# Patient Record
Sex: Female | Born: 1945
Health system: Southern US, Community
[De-identification: ages and names within clinical notes are randomized; demographics above are authoritative.]

## PROBLEM LIST (undated history)

## (undated) DIAGNOSIS — E039 Hypothyroidism, unspecified: Secondary | ICD-10-CM

## (undated) DIAGNOSIS — D126 Benign neoplasm of colon, unspecified: Secondary | ICD-10-CM

## (undated) DIAGNOSIS — T7840XA Allergy, unspecified, initial encounter: Secondary | ICD-10-CM

## (undated) DIAGNOSIS — G4730 Sleep apnea, unspecified: Secondary | ICD-10-CM

## (undated) DIAGNOSIS — E785 Hyperlipidemia, unspecified: Secondary | ICD-10-CM

## (undated) DIAGNOSIS — F32A Depression, unspecified: Secondary | ICD-10-CM

## (undated) DIAGNOSIS — Z9889 Other specified postprocedural states: Secondary | ICD-10-CM

## (undated) DIAGNOSIS — T4145XA Adverse effect of unspecified anesthetic, initial encounter: Secondary | ICD-10-CM

## (undated) DIAGNOSIS — T8859XA Other complications of anesthesia, initial encounter: Secondary | ICD-10-CM

## (undated) DIAGNOSIS — I10 Essential (primary) hypertension: Secondary | ICD-10-CM

## (undated) DIAGNOSIS — M1990 Unspecified osteoarthritis, unspecified site: Secondary | ICD-10-CM

## (undated) DIAGNOSIS — R011 Cardiac murmur, unspecified: Secondary | ICD-10-CM

## (undated) DIAGNOSIS — E079 Disorder of thyroid, unspecified: Secondary | ICD-10-CM

## (undated) DIAGNOSIS — R112 Nausea with vomiting, unspecified: Secondary | ICD-10-CM

## (undated) DIAGNOSIS — N189 Chronic kidney disease, unspecified: Secondary | ICD-10-CM

## (undated) DIAGNOSIS — F419 Anxiety disorder, unspecified: Secondary | ICD-10-CM

## (undated) HISTORY — DX: Hyperlipidemia, unspecified: E78.5

## (undated) HISTORY — DX: Benign neoplasm of colon, unspecified: D12.6

## (undated) HISTORY — PX: SPINE SURGERY: SHX786

## (undated) HISTORY — PX: DILATION AND CURETTAGE OF UTERUS: SHX78

## (undated) HISTORY — DX: Chronic kidney disease, unspecified: N18.9

## (undated) HISTORY — DX: Anxiety disorder, unspecified: F41.9

## (undated) HISTORY — PX: ABDOMINAL HYSTERECTOMY: SHX81

## (undated) HISTORY — DX: Disorder of thyroid, unspecified: E07.9

## (undated) HISTORY — DX: Unspecified osteoarthritis, unspecified site: M19.90

## (undated) HISTORY — PX: EYE SURGERY: SHX253

## (undated) HISTORY — PX: BACK SURGERY: SHX140

## (undated) HISTORY — DX: Allergy, unspecified, initial encounter: T78.40XA

## (undated) HISTORY — PX: TUBAL LIGATION: SHX77

## (undated) HISTORY — DX: Essential (primary) hypertension: I10

## (undated) HISTORY — PX: TONSILLECTOMY: SUR1361

## (undated) HISTORY — PX: JOINT REPLACEMENT: SHX530

## (undated) HISTORY — DX: Depression, unspecified: F32.A

## (undated) HISTORY — DX: Sleep apnea, unspecified: G47.30

## (undated) HISTORY — PX: APPENDECTOMY: SHX54

## (undated) HISTORY — PX: COLONOSCOPY W/ POLYPECTOMY: SHX1380

## (undated) HISTORY — DX: Cardiac murmur, unspecified: R01.1

---
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
## 1977-08-28 HISTORY — PX: DIAGNOSTIC LAPAROSCOPY: SUR761

## 1981-08-28 HISTORY — PX: BREAST EXCISIONAL BIOPSY: SUR124

## 1998-08-28 HISTORY — PX: THYROIDECTOMY: SHX17

## 1999-08-18 ENCOUNTER — Encounter: Payer: Self-pay | Admitting: Internal Medicine

## 1999-08-18 ENCOUNTER — Ambulatory Visit (HOSPITAL_COMMUNITY): Admission: RE | Admit: 1999-08-18 | Discharge: 1999-08-18 | Payer: Self-pay | Admitting: Internal Medicine

## 1999-09-09 ENCOUNTER — Ambulatory Visit (HOSPITAL_COMMUNITY): Admission: RE | Admit: 1999-09-09 | Discharge: 1999-09-09 | Payer: Self-pay

## 1999-09-09 ENCOUNTER — Encounter (INDEPENDENT_AMBULATORY_CARE_PROVIDER_SITE_OTHER): Payer: Self-pay | Admitting: Specialist

## 1999-10-06 ENCOUNTER — Encounter (INDEPENDENT_AMBULATORY_CARE_PROVIDER_SITE_OTHER): Payer: Self-pay | Admitting: *Deleted

## 1999-10-06 ENCOUNTER — Ambulatory Visit (HOSPITAL_COMMUNITY): Admission: RE | Admit: 1999-10-06 | Discharge: 1999-10-08 | Payer: Self-pay

## 2000-02-10 ENCOUNTER — Other Ambulatory Visit: Admission: RE | Admit: 2000-02-10 | Discharge: 2000-02-10 | Payer: Self-pay | Admitting: *Deleted

## 2000-12-14 ENCOUNTER — Other Ambulatory Visit: Admission: RE | Admit: 2000-12-14 | Discharge: 2000-12-14 | Payer: Self-pay | Admitting: *Deleted

## 2000-12-14 ENCOUNTER — Encounter (INDEPENDENT_AMBULATORY_CARE_PROVIDER_SITE_OTHER): Payer: Self-pay | Admitting: Specialist

## 2001-01-16 ENCOUNTER — Other Ambulatory Visit: Admission: RE | Admit: 2001-01-16 | Discharge: 2001-01-16 | Payer: Self-pay | Admitting: *Deleted

## 2001-08-28 HISTORY — PX: TOTAL ABDOMINAL HYSTERECTOMY W/ BILATERAL SALPINGOOPHORECTOMY: SHX83

## 2002-02-13 ENCOUNTER — Encounter: Payer: Self-pay | Admitting: *Deleted

## 2002-02-18 ENCOUNTER — Observation Stay (HOSPITAL_COMMUNITY): Admission: RE | Admit: 2002-02-18 | Discharge: 2002-02-19 | Payer: Self-pay | Admitting: *Deleted

## 2002-02-18 ENCOUNTER — Encounter (INDEPENDENT_AMBULATORY_CARE_PROVIDER_SITE_OTHER): Payer: Self-pay

## 2002-04-01 ENCOUNTER — Other Ambulatory Visit: Admission: RE | Admit: 2002-04-01 | Discharge: 2002-04-01 | Payer: Self-pay | Admitting: *Deleted

## 2003-01-05 ENCOUNTER — Emergency Department (HOSPITAL_COMMUNITY): Admission: EM | Admit: 2003-01-05 | Discharge: 2003-01-05 | Payer: Self-pay | Admitting: Emergency Medicine

## 2003-01-09 ENCOUNTER — Emergency Department (HOSPITAL_COMMUNITY): Admission: EM | Admit: 2003-01-09 | Discharge: 2003-01-09 | Payer: Self-pay | Admitting: Emergency Medicine

## 2003-05-26 ENCOUNTER — Other Ambulatory Visit: Admission: RE | Admit: 2003-05-26 | Discharge: 2003-05-26 | Payer: Self-pay | Admitting: *Deleted

## 2003-07-09 ENCOUNTER — Ambulatory Visit (HOSPITAL_COMMUNITY): Admission: RE | Admit: 2003-07-09 | Discharge: 2003-07-09 | Payer: Self-pay | Admitting: Plastic Surgery

## 2003-07-09 ENCOUNTER — Ambulatory Visit (HOSPITAL_BASED_OUTPATIENT_CLINIC_OR_DEPARTMENT_OTHER): Admission: RE | Admit: 2003-07-09 | Discharge: 2003-07-09 | Payer: Self-pay | Admitting: Plastic Surgery

## 2003-07-10 ENCOUNTER — Encounter (INDEPENDENT_AMBULATORY_CARE_PROVIDER_SITE_OTHER): Payer: Self-pay | Admitting: Plastic Surgery

## 2004-01-12 ENCOUNTER — Encounter: Payer: Self-pay | Admitting: Internal Medicine

## 2004-05-19 ENCOUNTER — Other Ambulatory Visit: Admission: RE | Admit: 2004-05-19 | Discharge: 2004-05-19 | Payer: Self-pay | Admitting: *Deleted

## 2004-07-06 ENCOUNTER — Ambulatory Visit: Payer: Self-pay | Admitting: Internal Medicine

## 2004-07-29 ENCOUNTER — Ambulatory Visit: Payer: Self-pay | Admitting: Internal Medicine

## 2004-08-24 ENCOUNTER — Ambulatory Visit: Payer: Self-pay | Admitting: Internal Medicine

## 2004-12-28 ENCOUNTER — Ambulatory Visit: Payer: Self-pay | Admitting: Gastroenterology

## 2005-01-12 ENCOUNTER — Ambulatory Visit: Payer: Self-pay | Admitting: Gastroenterology

## 2005-01-30 ENCOUNTER — Ambulatory Visit: Payer: Self-pay | Admitting: Internal Medicine

## 2005-02-13 ENCOUNTER — Ambulatory Visit: Payer: Self-pay | Admitting: Internal Medicine

## 2005-02-23 ENCOUNTER — Encounter: Admission: RE | Admit: 2005-02-23 | Discharge: 2005-05-24 | Payer: Self-pay | Admitting: Internal Medicine

## 2005-05-15 ENCOUNTER — Ambulatory Visit: Payer: Self-pay | Admitting: Internal Medicine

## 2005-05-25 ENCOUNTER — Ambulatory Visit: Payer: Self-pay | Admitting: Internal Medicine

## 2005-07-06 ENCOUNTER — Ambulatory Visit: Payer: Self-pay | Admitting: Internal Medicine

## 2005-07-10 ENCOUNTER — Ambulatory Visit: Payer: Self-pay | Admitting: Internal Medicine

## 2005-07-25 ENCOUNTER — Other Ambulatory Visit: Admission: RE | Admit: 2005-07-25 | Discharge: 2005-07-25 | Payer: Self-pay | Admitting: *Deleted

## 2005-10-25 ENCOUNTER — Ambulatory Visit: Payer: Self-pay | Admitting: Internal Medicine

## 2006-03-23 ENCOUNTER — Ambulatory Visit: Payer: Self-pay | Admitting: Internal Medicine

## 2006-07-13 ENCOUNTER — Ambulatory Visit: Payer: Self-pay | Admitting: Internal Medicine

## 2006-07-13 LAB — CONVERTED CEMR LAB
ALT: 20 units/L (ref 0–40)
AST: 22 units/L (ref 0–37)
Albumin: 4 g/dL (ref 3.5–5.2)
Alkaline Phosphatase: 86 units/L (ref 39–117)
BUN: 14 mg/dL (ref 6–23)
Basophils Absolute: 0 10*3/uL (ref 0.0–0.1)
Basophils Relative: 0.2 % (ref 0.0–1.0)
CO2: 29 meq/L (ref 19–32)
Calcium: 9.2 mg/dL (ref 8.4–10.5)
Chloride: 103 meq/L (ref 96–112)
Chol/HDL Ratio, serum: 4.8
Cholesterol: 187 mg/dL (ref 0–200)
Creatinine, Ser: 0.8 mg/dL (ref 0.4–1.2)
Eosinophil percent: 1.8 % (ref 0.0–5.0)
GFR calc non Af Amer: 78 mL/min
Glomerular Filtration Rate, Af Am: 94 mL/min/{1.73_m2}
Glucose, Bld: 86 mg/dL (ref 70–99)
HCT: 43.3 % (ref 36.0–46.0)
HDL: 38.9 mg/dL — ABNORMAL LOW (ref >39.0)
Hemoglobin: 14.7 g/dL (ref 12.0–15.0)
Hgb A1c MFr Bld: 5.6 % (ref 4.6–6.0)
LDL Cholesterol: 118 mg/dL — ABNORMAL HIGH (ref 0–99)
Lymphocytes Relative: 15.9 % (ref 12.0–46.0)
MCHC: 34 g/dL (ref 30.0–36.0)
MCV: 93 fL (ref 78.0–100.0)
Monocytes Absolute: 0.8 10*3/uL — ABNORMAL HIGH (ref 0.2–0.7)
Monocytes Relative: 8.8 % (ref 3.0–11.0)
Neutro Abs: 6.5 10*3/uL (ref 1.4–7.7)
Neutrophils Relative %: 73.3 % (ref 43.0–77.0)
Platelets: 331 10*3/uL (ref 150–400)
Potassium: 3.8 meq/L (ref 3.5–5.1)
RBC: 4.65 M/uL (ref 3.87–5.11)
RDW: 11.9 % (ref 11.5–14.6)
Sodium: 141 meq/L (ref 135–145)
TSH: 0.46 microintl units/mL (ref 0.35–5.50)
Total Bilirubin: 0.8 mg/dL (ref 0.3–1.2)
Total Protein: 7.7 g/dL (ref 6.0–8.3)
Triglyceride fasting, serum: 152 mg/dL — ABNORMAL HIGH (ref 0–149)
VLDL: 30 mg/dL (ref 0–40)
WBC: 8.9 10*3/uL (ref 4.5–10.5)

## 2006-09-06 ENCOUNTER — Other Ambulatory Visit: Admission: RE | Admit: 2006-09-06 | Discharge: 2006-09-06 | Payer: Self-pay | Admitting: *Deleted

## 2007-01-24 ENCOUNTER — Encounter: Payer: Self-pay | Admitting: Internal Medicine

## 2007-01-25 ENCOUNTER — Encounter: Payer: Self-pay | Admitting: Internal Medicine

## 2007-07-11 ENCOUNTER — Ambulatory Visit: Payer: Self-pay | Admitting: Internal Medicine

## 2007-07-11 LAB — CONVERTED CEMR LAB
ALT: 19 units/L (ref 0–35)
AST: 21 units/L (ref 0–37)
Albumin: 3.9 g/dL (ref 3.5–5.2)
Alkaline Phosphatase: 89 units/L (ref 39–117)
BUN: 15 mg/dL (ref 6–23)
Basophils Absolute: 0 10*3/uL (ref 0.0–0.1)
Basophils Relative: 0.1 % (ref 0.0–1.0)
Bilirubin, Direct: 0.1 mg/dL (ref 0.0–0.3)
CO2: 33 meq/L — ABNORMAL HIGH (ref 19–32)
Calcium: 9.2 mg/dL (ref 8.4–10.5)
Chloride: 105 meq/L (ref 96–112)
Cholesterol: 162 mg/dL (ref 0–200)
Creatinine, Ser: 0.8 mg/dL (ref 0.4–1.2)
Eosinophils Absolute: 0.2 10*3/uL (ref 0.0–0.6)
Eosinophils Relative: 2.5 % (ref 0.0–5.0)
GFR calc Af Amer: 94 mL/min
GFR calc non Af Amer: 78 mL/min
Glucose, Bld: 96 mg/dL (ref 70–99)
HCT: 40.9 % (ref 36.0–46.0)
HDL: 36.2 mg/dL — ABNORMAL LOW (ref >39.0)
Hemoglobin: 14.2 g/dL (ref 12.0–15.0)
Hgb A1c MFr Bld: 5.8 % (ref 4.6–6.0)
LDL Cholesterol: 90 mg/dL (ref 0–99)
Lymphocytes Relative: 15.9 % (ref 12.0–46.0)
MCHC: 34.6 g/dL (ref 30.0–36.0)
MCV: 93.2 fL (ref 78.0–100.0)
Monocytes Absolute: 0.8 10*3/uL — ABNORMAL HIGH (ref 0.2–0.7)
Monocytes Relative: 8.5 % (ref 3.0–11.0)
Neutro Abs: 6.9 10*3/uL (ref 1.4–7.7)
Neutrophils Relative %: 73 % (ref 43.0–77.0)
Platelets: 319 10*3/uL (ref 150–400)
Potassium: 4.4 meq/L (ref 3.5–5.1)
RBC: 4.39 M/uL (ref 3.87–5.11)
RDW: 12.1 % (ref 11.5–14.6)
Sodium: 143 meq/L (ref 135–145)
TSH: 0.62 microintl units/mL (ref 0.35–5.50)
Total Bilirubin: 0.7 mg/dL (ref 0.3–1.2)
Total CHOL/HDL Ratio: 4.5
Total Protein: 7.4 g/dL (ref 6.0–8.3)
Triglycerides: 179 mg/dL — ABNORMAL HIGH (ref 0–149)
VLDL: 36 mg/dL (ref 0–40)
WBC: 9.4 10*3/uL (ref 4.5–10.5)

## 2007-07-15 DIAGNOSIS — Z85850 Personal history of malignant neoplasm of thyroid: Secondary | ICD-10-CM | POA: Insufficient documentation

## 2007-07-15 DIAGNOSIS — Z86010 Personal history of colonic polyps: Secondary | ICD-10-CM

## 2007-07-15 DIAGNOSIS — M899 Disorder of bone, unspecified: Secondary | ICD-10-CM | POA: Insufficient documentation

## 2007-07-15 DIAGNOSIS — M949 Disorder of cartilage, unspecified: Secondary | ICD-10-CM

## 2007-07-18 ENCOUNTER — Ambulatory Visit: Payer: Self-pay | Admitting: Internal Medicine

## 2007-07-18 DIAGNOSIS — E890 Postprocedural hypothyroidism: Secondary | ICD-10-CM | POA: Insufficient documentation

## 2007-07-18 DIAGNOSIS — E782 Mixed hyperlipidemia: Secondary | ICD-10-CM | POA: Insufficient documentation

## 2007-07-18 DIAGNOSIS — I10 Essential (primary) hypertension: Secondary | ICD-10-CM

## 2007-07-18 LAB — CONVERTED CEMR LAB
Cholesterol, target level: 200 mg/dL
HDL goal, serum: 40 mg/dL
LDL Goal: 130 mg/dL

## 2007-07-22 ENCOUNTER — Telehealth (INDEPENDENT_AMBULATORY_CARE_PROVIDER_SITE_OTHER): Payer: Self-pay | Admitting: *Deleted

## 2007-10-25 ENCOUNTER — Telehealth (INDEPENDENT_AMBULATORY_CARE_PROVIDER_SITE_OTHER): Payer: Self-pay | Admitting: *Deleted

## 2007-10-31 ENCOUNTER — Other Ambulatory Visit: Admission: RE | Admit: 2007-10-31 | Discharge: 2007-10-31 | Payer: Self-pay | Admitting: *Deleted

## 2008-02-21 ENCOUNTER — Ambulatory Visit: Payer: Self-pay | Admitting: Internal Medicine

## 2008-02-27 ENCOUNTER — Telehealth (INDEPENDENT_AMBULATORY_CARE_PROVIDER_SITE_OTHER): Payer: Self-pay | Admitting: *Deleted

## 2008-03-01 LAB — CONVERTED CEMR LAB
Hgb A1c MFr Bld: 5.8 % (ref 4.6–6.0)
TSH: 1.84 microintl units/mL (ref 0.35–5.50)

## 2008-03-03 ENCOUNTER — Encounter (INDEPENDENT_AMBULATORY_CARE_PROVIDER_SITE_OTHER): Payer: Self-pay | Admitting: *Deleted

## 2008-03-26 ENCOUNTER — Telehealth (INDEPENDENT_AMBULATORY_CARE_PROVIDER_SITE_OTHER): Payer: Self-pay | Admitting: *Deleted

## 2008-04-23 ENCOUNTER — Ambulatory Visit: Payer: Self-pay | Admitting: Internal Medicine

## 2008-04-27 LAB — CONVERTED CEMR LAB: TSH: 0.18 microintl units/mL — ABNORMAL LOW (ref 0.35–5.50)

## 2008-04-28 ENCOUNTER — Encounter (INDEPENDENT_AMBULATORY_CARE_PROVIDER_SITE_OTHER): Payer: Self-pay | Admitting: *Deleted

## 2008-04-29 ENCOUNTER — Encounter (INDEPENDENT_AMBULATORY_CARE_PROVIDER_SITE_OTHER): Payer: Self-pay | Admitting: *Deleted

## 2008-04-29 ENCOUNTER — Ambulatory Visit: Payer: Self-pay | Admitting: Internal Medicine

## 2008-05-05 ENCOUNTER — Telehealth (INDEPENDENT_AMBULATORY_CARE_PROVIDER_SITE_OTHER): Payer: Self-pay | Admitting: *Deleted

## 2008-05-05 ENCOUNTER — Encounter (INDEPENDENT_AMBULATORY_CARE_PROVIDER_SITE_OTHER): Payer: Self-pay | Admitting: *Deleted

## 2008-05-19 ENCOUNTER — Ambulatory Visit: Payer: Self-pay | Admitting: Internal Medicine

## 2008-05-19 ENCOUNTER — Encounter: Payer: Self-pay | Admitting: Internal Medicine

## 2008-06-09 ENCOUNTER — Encounter (INDEPENDENT_AMBULATORY_CARE_PROVIDER_SITE_OTHER): Payer: Self-pay | Admitting: *Deleted

## 2008-06-10 ENCOUNTER — Ambulatory Visit: Payer: Self-pay | Admitting: Internal Medicine

## 2008-08-28 HISTORY — PX: NEPHRECTOMY: SHX65

## 2008-11-19 ENCOUNTER — Encounter: Payer: Self-pay | Admitting: Internal Medicine

## 2008-11-20 ENCOUNTER — Encounter: Admission: RE | Admit: 2008-11-20 | Discharge: 2008-11-20 | Payer: Self-pay | Admitting: Urology

## 2008-12-02 ENCOUNTER — Encounter: Payer: Self-pay | Admitting: Internal Medicine

## 2008-12-15 ENCOUNTER — Encounter: Payer: Self-pay | Admitting: Internal Medicine

## 2008-12-28 ENCOUNTER — Inpatient Hospital Stay (HOSPITAL_COMMUNITY): Admission: RE | Admit: 2008-12-28 | Discharge: 2008-12-31 | Payer: Self-pay | Admitting: Urology

## 2008-12-28 ENCOUNTER — Encounter (INDEPENDENT_AMBULATORY_CARE_PROVIDER_SITE_OTHER): Payer: Self-pay | Admitting: Urology

## 2009-02-05 ENCOUNTER — Telehealth (INDEPENDENT_AMBULATORY_CARE_PROVIDER_SITE_OTHER): Payer: Self-pay | Admitting: *Deleted

## 2009-03-04 ENCOUNTER — Ambulatory Visit: Payer: Self-pay | Admitting: Family Medicine

## 2009-03-04 DIAGNOSIS — I8000 Phlebitis and thrombophlebitis of superficial vessels of unspecified lower extremity: Secondary | ICD-10-CM | POA: Insufficient documentation

## 2009-03-25 ENCOUNTER — Ambulatory Visit: Payer: Self-pay | Admitting: Family Medicine

## 2009-03-26 ENCOUNTER — Telehealth (INDEPENDENT_AMBULATORY_CARE_PROVIDER_SITE_OTHER): Payer: Self-pay | Admitting: *Deleted

## 2009-04-26 ENCOUNTER — Telehealth (INDEPENDENT_AMBULATORY_CARE_PROVIDER_SITE_OTHER): Payer: Self-pay | Admitting: *Deleted

## 2009-05-28 ENCOUNTER — Ambulatory Visit: Payer: Self-pay | Admitting: Internal Medicine

## 2009-05-28 DIAGNOSIS — Z905 Acquired absence of kidney: Secondary | ICD-10-CM

## 2009-05-28 DIAGNOSIS — Z87442 Personal history of urinary calculi: Secondary | ICD-10-CM | POA: Insufficient documentation

## 2009-05-31 ENCOUNTER — Encounter (INDEPENDENT_AMBULATORY_CARE_PROVIDER_SITE_OTHER): Payer: Self-pay | Admitting: *Deleted

## 2009-06-01 ENCOUNTER — Encounter (INDEPENDENT_AMBULATORY_CARE_PROVIDER_SITE_OTHER): Payer: Self-pay | Admitting: *Deleted

## 2009-06-04 ENCOUNTER — Encounter: Payer: Self-pay | Admitting: Internal Medicine

## 2009-06-11 ENCOUNTER — Ambulatory Visit: Payer: Self-pay | Admitting: Internal Medicine

## 2009-06-14 ENCOUNTER — Encounter: Payer: Self-pay | Admitting: Family Medicine

## 2009-07-28 ENCOUNTER — Telehealth (INDEPENDENT_AMBULATORY_CARE_PROVIDER_SITE_OTHER): Payer: Self-pay | Admitting: *Deleted

## 2009-09-15 ENCOUNTER — Telehealth (INDEPENDENT_AMBULATORY_CARE_PROVIDER_SITE_OTHER): Payer: Self-pay | Admitting: *Deleted

## 2009-09-15 ENCOUNTER — Encounter (INDEPENDENT_AMBULATORY_CARE_PROVIDER_SITE_OTHER): Payer: Self-pay | Admitting: *Deleted

## 2009-12-01 ENCOUNTER — Ambulatory Visit: Payer: Self-pay | Admitting: Internal Medicine

## 2009-12-03 ENCOUNTER — Telehealth (INDEPENDENT_AMBULATORY_CARE_PROVIDER_SITE_OTHER): Payer: Self-pay | Admitting: *Deleted

## 2009-12-03 ENCOUNTER — Encounter: Payer: Self-pay | Admitting: Internal Medicine

## 2009-12-06 LAB — CONVERTED CEMR LAB
AST: 22 units/L (ref 0–37)
Albumin: 4.3 g/dL (ref 3.5–5.2)
Alkaline Phosphatase: 111 units/L (ref 39–117)
Bilirubin, Direct: 0 mg/dL (ref 0.0–0.3)
Cholesterol: 243 mg/dL — ABNORMAL HIGH (ref 0–200)
Total CHOL/HDL Ratio: 5

## 2009-12-23 ENCOUNTER — Telehealth (INDEPENDENT_AMBULATORY_CARE_PROVIDER_SITE_OTHER): Payer: Self-pay | Admitting: *Deleted

## 2009-12-24 ENCOUNTER — Encounter (INDEPENDENT_AMBULATORY_CARE_PROVIDER_SITE_OTHER): Payer: Self-pay | Admitting: *Deleted

## 2010-03-09 ENCOUNTER — Ambulatory Visit: Payer: Self-pay | Admitting: Internal Medicine

## 2010-03-09 ENCOUNTER — Telehealth: Payer: Self-pay | Admitting: Internal Medicine

## 2010-03-10 ENCOUNTER — Ambulatory Visit: Payer: Self-pay | Admitting: Internal Medicine

## 2010-04-18 ENCOUNTER — Ambulatory Visit: Payer: Self-pay | Admitting: Internal Medicine

## 2010-04-18 LAB — CONVERTED CEMR LAB: OCCULT 2: NEGATIVE

## 2010-04-19 ENCOUNTER — Encounter (INDEPENDENT_AMBULATORY_CARE_PROVIDER_SITE_OTHER): Payer: Self-pay | Admitting: *Deleted

## 2010-04-22 ENCOUNTER — Telehealth (INDEPENDENT_AMBULATORY_CARE_PROVIDER_SITE_OTHER): Payer: Self-pay | Admitting: *Deleted

## 2010-06-17 ENCOUNTER — Encounter: Payer: Self-pay | Admitting: Internal Medicine

## 2010-06-29 ENCOUNTER — Ambulatory Visit: Payer: Self-pay | Admitting: Internal Medicine

## 2010-07-29 ENCOUNTER — Telehealth: Payer: Self-pay | Admitting: Internal Medicine

## 2010-08-19 ENCOUNTER — Ambulatory Visit: Payer: Self-pay | Admitting: Internal Medicine

## 2010-08-19 ENCOUNTER — Encounter: Payer: Self-pay | Admitting: Internal Medicine

## 2010-08-23 LAB — CONVERTED CEMR LAB
AST: 24 units/L (ref 0–37)
Alkaline Phosphatase: 107 units/L (ref 39–117)
Basophils Absolute: 0 10*3/uL (ref 0.0–0.1)
Bilirubin, Direct: 0.1 mg/dL (ref 0.0–0.3)
CO2: 30 meq/L (ref 19–32)
Calcium: 9.5 mg/dL (ref 8.4–10.5)
Creatinine, Ser: 1.1 mg/dL (ref 0.4–1.2)
Eosinophils Absolute: 0.2 10*3/uL (ref 0.0–0.7)
GFR calc non Af Amer: 53.61 mL/min — ABNORMAL LOW (ref >60.00)
Glucose, Bld: 67 mg/dL — ABNORMAL LOW (ref 70–99)
Hemoglobin: 14.7 g/dL (ref 12.0–15.0)
Lymphocytes Relative: 20.2 % (ref 12.0–46.0)
MCHC: 34.7 g/dL (ref 30.0–36.0)
Monocytes Relative: 7.5 % (ref 3.0–12.0)
Neutro Abs: 4.7 10*3/uL (ref 1.4–7.7)
Neutrophils Relative %: 69.3 % (ref 43.0–77.0)
RDW: 12.5 % (ref 11.5–14.6)
Sodium: 140 meq/L (ref 135–145)
Total Bilirubin: 0.7 mg/dL (ref 0.3–1.2)

## 2010-08-24 ENCOUNTER — Telehealth (INDEPENDENT_AMBULATORY_CARE_PROVIDER_SITE_OTHER): Payer: Self-pay | Admitting: *Deleted

## 2010-09-25 LAB — CONVERTED CEMR LAB
ALT: 25 units/L (ref 0–35)
Albumin: 4.5 g/dL (ref 3.5–5.2)
Alkaline Phosphatase: 95 units/L (ref 39–117)
CO2: 31 meq/L (ref 19–32)
Calcium: 9.7 mg/dL (ref 8.4–10.5)
Creatinine, Ser: 1.1 mg/dL (ref 0.4–1.2)
Glucose, Bld: 85 mg/dL (ref 70–99)
Sodium: 144 meq/L (ref 135–145)
Total Protein: 7.8 g/dL (ref 6.0–8.3)

## 2010-09-29 NOTE — Assessment & Plan Note (Signed)
Summary: flu shot/cbs  Nurse Visit  CC: Flu shot./kb   Allergies: 1)  ! Codeine Sulfate (Codeine Sulfate) 2)  ! * Normadyne 3)  ! Erythromycin 4)  ! * Lipitor 40  Orders Added: 1)  Admin 1st Vaccine [90471] 2)  Flu Vaccine 79yrs + [[address]]              Flu Vaccine Consent Questions     Do you have a history of severe allergic reactions to this vaccine? no    Any prior history of allergic reactions to egg and/or gelatin? no    Do you have a sensitivity to the preservative Thimersol? no    Do you have a past history of Guillan-Barre Syndrome? no    Do you currently have an acute febrile illness? no    Have you ever had a severe reaction to latex? no    Vaccine information given and explained to patient? yes    Are you currently pregnant? no    Lot Number:[id number]   Exp Date:02/25/2011   Site Given  Left Deltoid IMu

## 2010-09-29 NOTE — Progress Notes (Signed)
Summary: refill  Phone Note Refill Request Message from:  Fax from Pharmacy on July 29, 2010 10:14 AM  Refills Requested: Medication #1:  LORAZEPAM 1 MG  TABS take 1/2 tab three times a day as needed [facility] [address] - fax [phone]  Initial call taken by: Okey Regal Spring,  July 29, 2010 10:15 AM    Prescriptions: LORAZEPAM 1 MG  TABS (LORAZEPAM) take 1/2 tab three times a day as needed  #60 x 0   Entered by:   Lucious Groves CMA   Authorized by:   Marga Melnick MD   Signed by:   Lucious Groves CMA on 07/29/2010   Method used:   Printed then faxed to ...       [id number] ([facility]       [address]       [place], [place]  [address]       Ph: [phone] or [phone]       Fax: [phone]   RxID:   [id number]

## 2010-09-29 NOTE — Progress Notes (Signed)
Summary: Refill Request  Phone Note Refill Request Message from:  [facility] [address] Fax #: [phone]  Refills Requested: Medication #1:  LORAZEPAM 1 MG  TABS take 1/2 tab three times a day as needed   Dosage confirmed as above?Dosage Confirmed   Supply Requested: 1 month   Last Refilled: 10/30/2009 Next Appointment Scheduled: none Initial call taken by: Harold Barban,  December 03, 2009 8:39 AM    Prescriptions: LORAZEPAM 1 MG  TABS (LORAZEPAM) take 1/2 tab three times a day as needed  #60 x 2   Entered by:   Shonna Chock   Authorized by:   Marga Melnick MD   Signed by:   Shonna Chock on 12/03/2009   Method used:   Printed then faxed to ...       [facility] [address] [facility] [address] (retail)       [address] [place], [place]  [address]       Ph: [phone]       Fax: [phone]   RxID:   [id number]

## 2010-09-29 NOTE — Progress Notes (Signed)
Summary: SIMVASTATIN 40 REFILL--NEW INFO ABOUT THIS MED  Phone Note Call from Patient Call back at Home Phone [phone]   Caller: Patient Summary of Call: PATIENT HAS NEW GRANDCHILD AND FORGOT TO SCHEDULE APPOINTMENT WITH DR HOPPER FOR MED REFILL---ONLY HAS 2 PILLS OF SIMVASTATIN 40 LEFT (THURSDAY AND FRIDAY)  HAS APPOINTMENT FOR THURSDAY, 12/30/2009 AT 4PM ----WANTS TO KNOW IF WE CAN CALL IN A  30 DAY PRESCRIPTION FOR GENERIC SIMVASTATIN 40 TO [facility] [address] (SHE IS CHANGING TO THIS [facility]) THAT WILL HOLD HER OVER UNTIL HER APPOINTMENT??  THERE IS A REFILL IN COMPUTER FOR 90 PILLS TO GO TO THE [facility] [address]---SHE DOES NOT WANT THAT PRESCRIPTION---SHE WANTS TO TALK TO DR HOPPER ABOUT THIS MEDICATION Initial call taken by: Jerolyn Shin,  December 23, 2009 3:44 PM  Follow-up for Phone Call        Patient aware rx sent to [facility] [address]. I sent number 90 cause it may be cheaper but patient aware if she would only like number 30 to inform the pharmacy Follow-up by: Shonna Chock,  December 23, 2009 4:57 PM    Prescriptions: SIMVASTATIN 40 MG TABS (SIMVASTATIN) 1 by mouth at bedtime  #90 x 0   Entered by:   Shonna Chock   Authorized by:   Marga Melnick MD   Signed by:   Shonna Chock on 12/23/2009   Method used:   Electronically to        [id number] ([facility]       [address]       [place], [place]  [address]       Ph: [phone] or [phone]       Fax: [phone]   RxID:   [id number]

## 2010-09-29 NOTE — Progress Notes (Signed)
Summary: OV Correct  Phone Note Call from Patient Call back at Home Phone [phone]   Caller: Patient Summary of Call: Message left on voicemail: Patient was suppose to email her paperwork and let Dr.Hopper know if any changes were to be made. Patient stated something is wrong with her computer so she was just calling to let Dr.Hopper know she did not find any changes to be made on her printout of her OV that was given to her on 08/19/2010   Shonna Chock CMA  August 24, 2010 10:49 AM

## 2010-09-29 NOTE — Letter (Signed)
Summary: Primary Care Appointment Letter  [facility] [place]  [address] [place], [place] [address]   Phone: [phone]  Fax: [phone]    09/15/2009 MRN: [id number]  [facility] [address] [place], [place]  [address]  Dear Ms. Vanwart,   Your Primary Care Physician Marga Melnick MD has indicated that:    _______it is time to schedule an appointment.    _______you missed your appointment on______ and need to call and          reschedule.    ___X____you need to have lab work done (Due to recent change in [employer] med, Dr.Hopper would like for you to have labs after 10weeks of starting new med "Simvastatin") Lipid/Hepatic 272.4/995.20 .    _______you need to schedule an appointment discuss lab or test results.    _______you need to call to reschedule your appointment that is                       scheduled on _________.     Please call our office as soon as possible. Our phone number is [phone]. Please press option 1. Our office is open 8a-12noon and 1p-5p, Monday through Friday.     Thank you,     Primary Care Scheduler

## 2010-09-29 NOTE — Progress Notes (Signed)
Summary: lower cost alternative  Phone Note Refill Request Message from:  [facility] from [facility] [address] fax [phone]  Refills Requested: Medication #1:  VYTORIN 10-40 MG TABS 1 by mouth once daily patient has requested that we contact you to obtain a lower cost alternative for this current medications. simvastatin  Initial call taken by: Barb Merino,  September 15, 2009 8:57 AM  Follow-up for Phone Call        Dr.Hopper please advise on dose of Simvastatin that would be equal to patient's dose of Vytorin. Follow-up by: Shonna Chock,  September 15, 2009 9:05 AM  Additional Follow-up for Phone Call Additional follow up Details #1::        simvastatin 40 mg once daily #90; labs after 10 weeks Additional Follow-up by: Marga Melnick MD,  September 15, 2009 9:38 AM    Additional Follow-up for Phone Call Additional follow up Details #2::    Med sent in, letter mailed to inform patient appointment due in 10 weeks. Follow-up by: Shonna Chock,  September 15, 2009 9:52 AM  New/Updated Medications: SIMVASTATIN 40 MG TABS (SIMVASTATIN) 1 by mouth at bedtime Prescriptions: SIMVASTATIN 40 MG TABS (SIMVASTATIN) 1 by mouth at bedtime  #90 x 0   Entered by:   Shonna Chock   Authorized by:   Marga Melnick MD   Signed by:   Shonna Chock on 09/15/2009   Method used:   [facility]ronically to        [phone][facility] (retail)       [address] [place], [place]  [address]       Ph: [phone]       Fax: [phone]   RxID:   [id number]

## 2010-09-29 NOTE — Letter (Signed)
Summary: Colonoscopy Letter  [facility]  [address] [place], [place] [address]   Phone: [phone]  Fax: [phone]      December 24, 2009 MRN: [id number]   [facility] [place] [address] [place], [place]  [address]   Dear [name],   According to your medical record, it is time for you to schedule a Colonoscopy. The American Cancer Society recommends this procedure as a method to detect early colon cancer. Patients with a family history of colon cancer, or a personal history of colon polyps or inflammatory bowel disease are at increased risk.  This letter has beeen generated based on the recommendations made at the time of your procedure. If you feel that in your particular situation this may no longer apply, please contact our office.  Please call our office at [phone] to schedule this appointment or to update your records at your earliest convenience.  Thank you for cooperating with [place] to provide you with the very best care possible.   Sincerely,  Judie Petit T. Russella Dar, M.D.  [facility] [phone]

## 2010-09-29 NOTE — Letter (Signed)
Summary: [facility]  [facility]   Imported By: Lanelle Bal 12/20/2009 13:12:36  _____________________________________________________________________  External Attachment:    Type:   Image     Comment:   External Document

## 2010-09-29 NOTE — Progress Notes (Signed)
Summary: REFILL REQUEST  Phone Note Refill Request Call back at [phone] Message from:  Pharmacy on April 22, 2010 8:32 AM  Refills Requested: Medication #1:  LORAZEPAM 1 MG  TABS take 1/2 tab three times a day as needed   Dosage confirmed as above?Dosage Confirmed   Supply Requested: 1 month   Last [facility]: [facility]/2010 [facility]  Next Appointment Scheduled: NONE Initial call taken by: Lavell Islam,  April 22, 2010 8:33 AM    Prescriptions: LORAZEPAM 1 MG  TABS (LORAZEPAM) take 1/2 tab three times a day as needed  #60 x 1   Entered by:   Shonna Chock CMA   Authorized by:   Marga Melnick MD   Signed by:   Shonna Chock CMA on 04/22/2010   Method used:   Printed then faxed to ...       [facility] [address] [facility])       [address] [place], [place]  [address]       Ph: [phone]       Fax: [phone]   RxID:   [id number]

## 2010-09-29 NOTE — Assessment & Plan Note (Signed)
Summary: REVIEWS--PH   Vital Signs:  Patient profile:   65 year old female Weight:      152.2 pounds BMI:     24.65 Pulse rate:   76 / minute Resp:     15 per minute BP sitting:   130 / 82  (left arm) Cuff size:   large  Vitals Entered By: Shonna Chock CMA (March 09, 2010 9:30 AM) CC: Follow-up visit: discuss labs from April 2011 Comments REVIEWED MED LIST, PATIENT AGREED DOSE AND INSTRUCTION CORRECT    CC:  Follow-up visit: discuss labs from April 2011.  History of Present Illness:  Hyperlipidemia Follow-Up      This is a 65 year old woman who presents for Hyperlipidemia follow-up.  The patient denies muscle aches, GI upset, abdominal pain, flushing, itching, constipation, diarrhea, and fatigue.  The patient denies the following symptoms: chest pain/pressure, exercise intolerance, dypsnea, palpitations, & syncope.She notes some hand edema esp walking.  Compliance with medications (by patient report) has been near 100%.  Dietary compliance has been fair.  The patient reports exercising occasionally.  Labs reviewed & risks discussed. Role of HFCS in causing  increased TG reviewed.  Allergies: 1)  ! Codeine Sulfate (Codeine Sulfate) 2)  ! * Normadyne 3)  ! Erythromycin 4)  ! * Lipitor 40  Physical Exam  General:  well-nourished; alert,appropriate and cooperative throughout examination Lungs:  Normal respiratory effort, chest expands symmetrically. Lungs are clear to auscultation, no crackles or wheezes. Heart:  Normal rate and regular rhythm. S1 and S2 normal without gallop, murmur, click, rub .S4 Pulses:  R and L carotid,radial,dorsalis pedis and posterior tibial pulses are full and equal bilaterally Extremities:  No clubbing, cyanosis, edema. Skin:  Intact without suspicious lesions or rashes Psych:  memory intact for recent and remote, normally interactive, and good eye contact.   Focused & intelligent   Impression & Recommendations:  Problem # 1:  HYPERLIPIDEMIA  (ICD-272.2)  Her updated medication list for this problem includes:    Simvastatin 40 Mg Tabs (Simvastatin) .[name]... 1 by mouth at bedtime  Problem # 2:  HYPERTENSION, ESSENTIAL NOS (ICD-401.9) controlled Her updated medication list for this problem includes:    Toprol Xl 50 Mg Tb24 (Metoprolol succinate) .[name]... 1 by mouth once daily    Lotrel 5-10 Mg Caps (Amlodipine besy-benazepril hcl) .[name]... 1 by mouth once daily  Complete Medication List: 1)  Lorazepam 1 Mg Tabs (Lorazepam) .... Take 1/2 tab three times a day as needed 2)  Levothyroxine Sodium 100 Mcg Tabs (Levothyroxine sodium) .[name].. 1 by mouth once daily except 1 1/2 on wed 3)  Toprol Xl 50 Mg Tb24 (Metoprolol succinate) .[name].. 1 by mouth once daily 4)  Simvastatin 40 Mg Tabs (Simvastatin) .[name].. 1 by mouth at bedtime 5)  Lotrel 5-10 Mg Caps (Amlodipine besy-benazepril hcl) .[name].. 1 by mouth once daily 6)  Aspirin Adult Low Strength 81 Mg Tbec (Aspirin) .[name].. 1 by mouth once daily 7)  Caltrate W/ Vit. D  .... Once daily 8)  Centrum Silver Tabs (Multiple vitamins-minerals) .... Take 1 tablet by mouth daily 9)  Mucinex  .... Prn 10)  Urocit-k 10 10 Meq (1080 Mg) Cr-tabs (Potassium citrate) .... 3 by mouth once daily  Patient Instructions: 1)  Consume < 30 grams of HFCS sugar / day as discussed. 2)  Please schedule a follow-up appointment in 4 months. 3)  Lipid Panel prior to visit, ICD-9:272.4 4)  TSH prior to visit, ICD-9:244.9 5)  HbgA1C prior to visit,  ICD-9:272.4 Prescriptions: SIMVASTATIN 40 MG TABS (SIMVASTATIN) 1 by mouth at bedtime  #90 x 1   Entered and Authorized by:   Marga Melnick MD   Signed by:   Marga Melnick MD on 03/09/2010   Method used:   Faxed to ...       [facility] [address] [facility] [address] (retail)       [address] [place], [place]  [address]       Ph: [phone]       Fax: [phone]   RxID:   [id number]

## 2010-09-29 NOTE — Progress Notes (Signed)
Summary: Simvastatin interaction  Phone Note From Pharmacy   Summary of Call: Office received note from pharmacy that the patient simvastatin 40mg  interacts with amlodipine causing elevated levels of simvastin. This can result in myopathy and rhabdomyolysis. Per MD simvastatin was changed to 20mg . Initial call taken by: Lucious Groves CMA,  March 09, 2010 4:31 PM    New/Updated Medications: SIMVASTATIN 20 MG TABS (SIMVASTATIN) 1 by mouth qd Prescriptions: SIMVASTATIN 20 MG TABS (SIMVASTATIN) 1 by mouth qd  #30 x 0   Entered by:   Lucious Groves CMA   Authorized by:   Marga Melnick MD   Signed by:   Lucious Groves CMA on 03/09/2010   Method used:   [phone][facility] (retail)       [address] [place], [place]  [address]       Ph: [phone]       Fax: [phone]   RxID:   [id number]

## 2010-09-29 NOTE — Letter (Signed)
Summary: [facility]  [facility]   Imported By: Lanelle Bal 07/11/2010 13:57:35  _____________________________________________________________________  External Attachment:    Type:   Image     Comment:   External Document

## 2010-09-29 NOTE — Assessment & Plan Note (Signed)
Summary: YEARLY AND FASTING LABS//PH   Vital Signs:  Patient profile:   65 year old female Height:      65.5 inches Weight:      152 pounds BMI:     25.00 Temp:     98.7 degrees F oral Pulse rate:   76 / minute Resp:     14 per minute BP sitting:   122 / 70  (left arm) Cuff size:   large  Vitals Entered By: Shonna Chock CMA (August 19, 2010 10:47 AM) CC: CPX with fasting labs , Lipid Management   CC:  CPX with fasting labs  and Lipid Management.  History of Present Illness:     Mrs. Lio is here for a physica; she is asymptomatic. Hypertension Follow-Up:  The patient reports edema only in hands this  past Summer, now resolved. She  denies lightheadedness, urinary frequency, headaches, and fatigue.  The patient denies the following associated symptoms: chest pain, chest pressure, exercise intolerance, dyspnea, palpitations, and syncope.  Compliance with medications (by patient report) has been near 100%.  The patient reports that dietary compliance has been good.  The patient reports exercising 3-4X per week.  Adjunctive measures currently used by the patient include salt restriction.  BP averages 130/70. Hyperlipidemia Follow-Up:  The patient denies muscle aches, GI upset, abdominal pain, flushing, itching, constipation, and diarrhea.  Compliance with medications (by patient report) has been near 100%.  Adjunctive measures currently used by the patient include fiber and ASA.    Lipid Management History:      Positive NCEP/ATP III risk factors include female age 65 years old or older and hypertension.  Negative NCEP/ATP III risk factors include no history of early menopause without estrogen hormone replacement, non-diabetic, no family history for ischemic heart disease, non-tobacco-user status, no ASHD (atherosclerotic heart disease), no prior stroke/TIA, no peripheral vascular disease, and no history of aortic aneurysm.     Preventive Screening-Counseling &  Management  Alcohol-Tobacco     Smoking Status: quit  Current Medications (verified): 1)  Lorazepam 1 Mg  Tabs (Lorazepam) .... Take 1/2 Tab Three Times A Day As Needed 2)  Levothyroxine Sodium 100 Mcg Tabs (Levothyroxine Sodium) .[name].. 1 By Mouth Once Daily Except 1 1/2 On Wed 3)  Toprol Xl 50 Mg Tb24 (Metoprolol Succinate) .[name].. 1 By Mouth Once Daily 4)  Lotrel 5-10 Mg Caps (Amlodipine Besy-Benazepril Hcl) .[name].. 1 By Mouth Once Daily 5)  Aspirin Adult Low Strength 81 Mg Tbec (Aspirin) .[name].. 1 By Mouth Once Daily 6)  Caltrate W/ Vit. D .... Once Daily 7)  Centrum Silver   Tabs (Multiple Vitamins-Minerals) .... Take 1 Tablet By Mouth Daily 8)  Mucinex .... Prn 9)  Urocit-K 10 10 Meq (1080 Mg) Cr-Tabs (Potassium Citrate) .... 3 By Mouth Once Daily 10)  Simvastatin 20 Mg Tabs (Simvastatin) .[name].. 1 By Mouth At Bedtime  Allergies: 1)  ! Codeine Sulfate (Codeine Sulfate) 2)  ! * Normadyne 3)  ! Erythromycin 4)  ! * Lipitor 40  Past History:  Past Medical History: Hyperlipidemia:NMR Lipoprofile 2005: LDL 252(3822/2809),TG 456,HDL 29 . Framingham Study LDL goal= < 130. Hypertension Hypothyroidism,post surgery for thyroid cancer (193) 2001 Vitamin D deficiency ( 35 in 04/2008 ) Osteopenia: -1.4 on BMD 04/2008 (due 04/2010) Nephrolithiasis, PMH  of X 2 Colonic polyps, PMH  of, Adenomatous  Past Surgical History: Hysterectomy and BSO for dysfunctional  bleeding 2003, Dr Randell Patient ( now seeing Dr Chevis Pretty) Thyroidectomy for localized microscopic cancer 2001, no radiation  or chemotherapy  (annual thyroglobulin antibody , calcitonin monitor) Tonsillectomy Nephrectomy-Left for 9.8 cm benign  complex cystic,  mass G2 P2, D&C X 2 Colon polypectomy 2003 , 2006, Dr  Claudette Head  Family History: Father: TIA (70'S), CAD, stents Mother: hypercholesterolemia, CVA, thyroid insufficiency , CABG Siblings: sister:renal calculi  Social History: Occupation: [employer] part time Married Former Smoker:  quit 1977 Alcohol use-yes: minimally Regular exercise-yes: walks 3 X/week  Review of Systems  The patient denies anorexia, fever, vision loss, decreased hearing, hoarseness, prolonged cough, hemoptysis, melena, hematochezia, severe indigestion/heartburn, hematuria, suspicious skin lesions, depression, unusual weight change, abnormal bleeding, and enlarged lymph nodes.   Derm:  Denies changes in nail beds, dryness, and hair loss. Neuro:  Denies numbness and tingling. Endo:  Denies cold intolerance, excessive hunger, excessive thirst, excessive urination, heat intolerance, and weight change.  Physical Exam  General:  well-nourished;alert,appropriate and cooperative throughout examination Head:  Normocephalic and atraumatic without obvious abnormalities.  Eyes:  No corneal or conjunctival inflammation noted. Perrla. Funduscopic exam benign, without hemorrhages, exudates or papilledema.  Ears:  External ear exam shows no significant lesions or deformities.  Otoscopic examination reveals clear canals, tympanic membranes are intact bilaterally without bulging, retraction, inflammation or discharge. Hearing is grossly normal bilaterally. Nose:  External nasal examination shows no deformity or inflammation. Nasal mucosa are pink and moist without lesions or exudates. Mouth:  Oral mucosa and oropharynx without lesions or exudates.  Teeth in good repair. Neck:  No deformities, masses, or tenderness noted.Post op changes Lungs:  Normal respiratory effort, chest expands symmetrically. Lungs are clear to auscultation, no crackles or wheezes. Heart:  Normal rate and regular rhythm. S1 and S2 normal without gallop, murmur, click, rub . S4 with sluring Abdomen:  Bowel sounds positive,abdomen soft and non-tender without masses, organomegaly or hernias noted. Rectal:  FOB negative 04/2010 Genitalia:  Dr Chevis Pretty 04/2010 Msk:  No deformity or scoliosis noted of thoracic or lumbar spine.   Pulses:  R and L  carotid,radial,dorsalis pedis and posterior tibial pulses are full and equal bilaterally Extremities:  No clubbing, cyanosis, edema. Mid DIP OA  hand changes with normal full range of motion of all joints.   Neurologic:  alert & oriented X3 and DTRs symmetrical and normal.  No tremor Skin:  Intact without suspicious lesions or rashes Cervical Nodes:  No lymphadenopathy noted Axillary Nodes:  No palpable lymphadenopathy Psych:  memory intact for recent and remote, normally interactive, and good eye contact.     Impression & Recommendations:  Problem # 1:  ROUTINE GENERAL MEDICAL EXAM@HEALTH  CARE FACL (ICD-V70.0)  Orders: EKG w/ Interpretation (93000) TLB-BMP (Basic Metabolic Panel-BMET) (80048-METABOL) TLB-CBC Platelet - w/Differential (85025-CBCD) TLB-Hepatic/Liver Function Pnl (80076-HEPATIC) TLB-TSH (Thyroid Stimulating Hormone) (84443-TSH) Venipuncture (46962) T- * Misc. Laboratory test [phone]) T-Vitamin D (25-Hydroxy) [id number])  Problem # 2:  HYPERTENSION, ESSENTIAL NOS (ICD-401.9) controlled Her updated medication list for this problem includes:    Toprol Xl 50 Mg Tb24 (Metoprolol succinate) .[name]... 1 by mouth once daily    Lotrel 5-10 Mg Caps (Amlodipine besy-benazepril hcl) .[name]... 1 by mouth once daily  Problem # 3:  HYPERLIPIDEMIA (ICD-272.2)  Her updated medication list for this problem includes:    Simvastatin 20 Mg Tabs (Simvastatin) .[name]... 1 by mouth at bedtime  Orders: T- * Misc. Laboratory test [phone])  Problem # 4:  HYPOTHYROIDISM (ICD-244.9) S/P thyroidectomy for malignancy Her updated medication list for this problem includes:    Levothyroxine Sodium 100 Mcg Tabs (Levothyroxine sodium) .[name]... 1 by  mouth once daily except 1 1/2 on wed  Problem # 5:  OSTEOPENIA (ICD-733.90)  Problem # 6:  Hx of COLONIC POLYPS, HX OF (ICD-V12.72)  Problem # 7:  Hx of THYROID CANCER, HX OF (ICD-V10.87)  Orders: T- * Misc. Laboratory test [phone])  Complete Medication  List: 1)  Lorazepam 1 Mg Tabs (Lorazepam) .... Take 1/2 tab three times a day as needed 2)  Levothyroxine Sodium 100 Mcg Tabs (Levothyroxine sodium) .[name].. 1 by mouth once daily except 1 1/2 on wed 3)  Toprol Xl 50 Mg Tb24 (Metoprolol succinate) .[name].. 1 by mouth once daily 4)  Lotrel 5-10 Mg Caps (Amlodipine besy-benazepril hcl) .[name].. 1 by mouth once daily 5)  Aspirin Adult Low Strength 81 Mg Tbec (Aspirin) .[name].. 1 by mouth once daily 6)  Caltrate W/ Vit. D  .... Once daily 7)  Centrum Silver Tabs (Multiple vitamins-minerals) .... Take 1 tablet by mouth daily 8)  Mucinex  .... Prn 9)  Urocit-k 10 10 Meq (1080 Mg) Cr-tabs (Potassium citrate) .... 3 by mouth once daily 10)  Simvastatin 20 Mg Tabs (Simvastatin) .[name].. 1 by mouth at bedtime  Lipid Assessment/Plan:      Based on NCEP/ATP III, the patient's risk factor category is "0-1 risk factors".  The patient's lipid goals are as follows: Total cholesterol goal is 200; LDL cholesterol goal is 130; HDL cholesterol goal is 40; Triglyceride goal is 150.  Her LDL cholesterol goal has been met.    Patient Instructions: 1)  BMD  every 25 months. 2)  Please schedule your mammogram. 3)  Schedule a colonoscopy  to help detect colon cancer because of past history of colon polyps. Prescriptions: SIMVASTATIN 20 MG TABS (SIMVASTATIN) 1 by mouth at bedtime  #90 x 3   Entered and Authorized by:   Marga Melnick MD   Signed by:   Marga Melnick MD on 08/19/2010   Method used:   Print then Give to Patient   RxID:   [id number] LOTREL 5-10 MG CAPS (AMLODIPINE BESY-BENAZEPRIL HCL) 1 by mouth once daily  #90 Capsule x 3   Entered and Authorized by:   Marga Melnick MD   Signed by:   Marga Melnick MD on 08/19/2010   Method used:   Print then Give to Patient   RxID:   [id number] TOPROL XL 50 MG TB24 (METOPROLOL SUCCINATE) 1 by mouth once daily  #90 Tablet x 3   Entered and Authorized by:   Marga Melnick MD   Signed by:   Marga Melnick MD on  08/19/2010   Method used:   Print then Give to Patient   RxID:   [id number] LEVOTHYROXINE SODIUM 100 MCG TABS (LEVOTHYROXINE SODIUM) 1 by mouth once daily EXCEPT 1 1/2 ON WED  #90 Tablet x 3   Entered and Authorized by:   Marga Melnick MD   Signed by:   Marga Melnick MD on 08/19/2010   Method used:   Print then Give to Patient   RxID:   [id number] LORAZEPAM 1 MG  TABS (LORAZEPAM) take 1/2 tab three times a day as needed  #60 x 5   Entered and Authorized by:   Marga Melnick MD   Signed by:   Marga Melnick MD on 08/19/2010   Method used:   Print then Give to Patient   RxID:   [id number]    Orders Added: 1)  Est. Patient 40-64 years [99396] 2)  EKG w/ Interpretation [93000] 3)  TLB-BMP (Basic Metabolic Panel-BMET) [80048-METABOL] 4)  TLB-CBC Platelet - w/Differential [85025-CBCD] 5)  TLB-Hepatic/Liver Function Pnl [80076-HEPATIC] 6)  TLB-TSH (Thyroid Stimulating Hormone) [84443-TSH] 7)  Venipuncture [36415] 8)  T- * Misc. Laboratory test [phone] 9)  T-Vitamin D (25-Hydroxy) [phone]

## 2010-09-29 NOTE — Letter (Signed)
Summary: Results Follow up Letter  [facility]  [address] [place], [place] [address]   Phone: [phone]  Fax: [phone]    04/19/2010 MRN: [id number]  [facility] [place] [address] [place], [place]  [address]  Dear Ms. Blok,  The following are the results of your recent test(s):  Test         Result    Pap Smear:        Normal _____  Not Normal _____ Comments: ______________________________________________________ Cholesterol: LDL(Bad cholesterol):         Your goal is less than:         HDL (Good cholesterol):       Your goal is more than: Comments:  ______________________________________________________ Mammogram:        Normal _____  Not Normal _____ Comments:  ___________________________________________________________________ Hemoccult:        Normal _X____  Not normal _______ Comments:    _____________________________________________________________________ Other Tests:    We routinely do not discuss normal results over the telephone.  If you desire a copy of the results, or you have any questions about this information we can discuss them at your next office visit.   Sincerely,

## 2010-09-30 ENCOUNTER — Ambulatory Visit (INDEPENDENT_AMBULATORY_CARE_PROVIDER_SITE_OTHER): Payer: BC Managed Care – PPO | Admitting: Family Medicine

## 2010-09-30 ENCOUNTER — Encounter: Payer: Self-pay | Admitting: Family Medicine

## 2010-09-30 DIAGNOSIS — M25569 Pain in unspecified knee: Secondary | ICD-10-CM

## 2010-09-30 DIAGNOSIS — R42 Dizziness and giddiness: Secondary | ICD-10-CM

## 2010-10-03 ENCOUNTER — Telehealth: Payer: Self-pay | Admitting: Internal Medicine

## 2010-10-05 ENCOUNTER — Encounter: Payer: Self-pay | Admitting: Internal Medicine

## 2010-10-05 ENCOUNTER — Telehealth: Payer: Self-pay | Admitting: Family Medicine

## 2010-10-05 NOTE — Assessment & Plan Note (Signed)
Summary: L knee pain   Vital Signs:  Patient profile:   65 year old female Weight:      154 pounds BMI:     25.33 Pulse rate:   98 / minute BP sitting:   120 / 78  (left arm)  Vitals Entered By: Doristine Devoid CMA (September 30, 2010 3:55 PM) CC: L knee pain after fall a few weeks ago bearing weight is uncomfortable also sinus pressure   History of Present Illness: 65 yo woman here today for L knee pain.  fell 3 weeks ago on anterior knee.  5 days ago developed pain laterally and is unable to do the stairs on that side- having to go 1 leg at a time.  pain w/ weight bearing.  pain w/ extremes of flexion but not extension.  pain w/ lateral movement.  used tylenol arthritis w/out relief.  used ibuprofen sparingly due to single kidney- no relief.  knee now clicking and popping- feels as if it's locking.  dizziness- room will spin when bending forward, lying in bed, rolling side to side.  no hx of vertigo.  no facial pain or pressure.  no nasal congestion.  no fevers/chills.  sxs resolve when sitting still.  will have some nausea w/ the spinning.  sxs started 3 days ago.  Current Medications (verified): 1)  Lorazepam 1 Mg  Tabs (Lorazepam) .... Take 1/2 Tab Three Times A Day As Needed 2)  Levothyroxine Sodium 100 Mcg Tabs (Levothyroxine Sodium) .[name].. 1 By Mouth Once Daily Except 1 1/2 On Wed 3)  Toprol Xl 50 Mg Tb24 (Metoprolol Succinate) .[name].. 1 By Mouth Once Daily 4)  Lotrel 5-10 Mg Caps (Amlodipine Besy-Benazepril Hcl) .[name].. 1 By Mouth Once Daily 5)  Aspirin Adult Low Strength 81 Mg Tbec (Aspirin) .[name].. 1 By Mouth Once Daily 6)  Caltrate W/ Vit. D .... Once Daily 7)  Centrum Silver   Tabs (Multiple Vitamins-Minerals) .... Take 1 Tablet By Mouth Daily 8)  Mucinex .... Prn 9)  Urocit-K 10 10 Meq (1080 Mg) Cr-Tabs (Potassium Citrate) .... 3 By Mouth Once Daily 10)  Simvastatin 20 Mg Tabs (Simvastatin) .[name].. 1 By Mouth At Bedtime  Allergies (verified): 1)  ! Codeine Sulfate (Codeine Sulfate) 2)   ! * Normadyne 3)  ! Erythromycin 4)  ! * Lipitor 40  Past History:  Past medical, surgical, family and social histories (including risk factors) reviewed for relevance to current acute and chronic problems.  Past Medical History: Reviewed history from 08/19/2010 and no changes required. Hyperlipidemia:NMR Lipoprofile 2005: LDL 252(3822/2809),TG 456,HDL 29 . Framingham Study LDL goal= < 130. Hypertension Hypothyroidism,post surgery for thyroid cancer (193) 2001 Vitamin D deficiency ( 35 in 04/2008 ) Osteopenia: -1.4 on BMD 04/2008 (due 04/2010) Nephrolithiasis, PMH  of X 2 Colonic polyps, PMH  of, Adenomatous  Past Surgical History: Reviewed history from 08/19/2010 and no changes required. Hysterectomy and BSO for dysfunctional  bleeding 2003, Dr Randell Patient ( now seeing Dr Chevis Pretty) Thyroidectomy for localized microscopic cancer 2001, no radiation or chemotherapy  (annual thyroglobulin antibody , calcitonin monitor) Tonsillectomy Nephrectomy-Left for 9.8 cm benign  complex cystic,  mass G2 P2, D&C X 2 Colon polypectomy 2003 , 2006, Dr  Claudette Head  Family History: Reviewed history from 08/19/2010 and no changes required. Father: TIA (70'S), CAD, stents Mother: hypercholesterolemia, CVA, thyroid insufficiency , CABG Siblings: sister:renal calculi  Social History: Reviewed history from 08/19/2010 and no changes required. Occupation: [employer] part time Married Former Smoker: quit 1977 Alcohol  use-yes: minimally Regular exercise-yes: walks 3 X/week  Review of Systems      See HPI  Physical Exam  General:  well-nourished;alert,appropriate and cooperative throughout examination Head:  NCAT, no TTP over sinuses Eyes:  PERRL, EOMI, 3 beats of Nystagmus when looking R Ears:  External ear exam shows no significant lesions or deformities.  Otoscopic examination reveals clear canals, tympanic membranes are intact bilaterally without bulging, retraction, inflammation or discharge.  Hearing is grossly normal bilaterally. Nose:  External nasal examination shows no deformity or inflammation. Nasal mucosa are pink and moist without lesions or exudates. Msk:  + TTP over L knee lateral joint line pain w/ extreme flexion, no pain w/ extension + McMurray's no edema or effusion Pulses:  +2 DP/PT Extremities:  no C/C/E Neurologic:  alert & oriented X3, cranial nerves II-XII intact, strength normal in all extremities, sensation intact to light touch, gait normal, and DTRs symmetrical and normal.  + dix-halpike   Impression & Recommendations:  Problem # 1:  KNEE PAIN, ACUTE (ICD-719.46) Assessment New given pt's lateral joint pain, + McMurray's and clicking/popping suspect meniscus injury.  start voltaren gel given solitary kidney and refer to ortho for complete evaluation and tx.  Pt expresses understanding and is in agreement w/ this plan. Her updated medication list for this problem includes:    Aspirin Adult Low Strength 81 Mg Tbec (Aspirin) .[name]... 1 by mouth once daily  Problem # 2:  VERTIGO (ICD-780.4) Assessment: New pt's sxs and PE consistent w/ BPV.  start Meclizine.  reviewed dx and tx plan.  reviewed supportive care and red flags that should prompt return.  Pt expresses understanding and is in agreement w/ this plan. Her updated medication list for this problem includes:    Meclizine Hcl 25 Mg Tabs (Meclizine hcl) .[name]... 1 by mouth every 6 hrs as needed vertigo  Complete Medication List: 1)  Lorazepam 1 Mg Tabs (Lorazepam) .... Take 1/2 tab three times a day as needed 2)  Levothyroxine Sodium 100 Mcg Tabs (Levothyroxine sodium) .[name].. 1 by mouth once daily except 1 1/2 on wed 3)  Toprol Xl 50 Mg Tb24 (Metoprolol succinate) .[name].. 1 by mouth once daily 4)  Lotrel 5-10 Mg Caps (Amlodipine besy-benazepril hcl) .[name].. 1 by mouth once daily 5)  Aspirin Adult Low Strength 81 Mg Tbec (Aspirin) .[name].. 1 by mouth once daily 6)  Caltrate W/ Vit. D  .... Once daily 7)  Centrum Silver  Tabs (Multiple vitamins-minerals) .... Take 1 tablet by mouth daily 8)  Mucinex  .... Prn 9)  Urocit-k 10 10 Meq (1080 Mg) Cr-tabs (Potassium citrate) .... 3 by mouth once daily 10)  Simvastatin 20 Mg Tabs (Simvastatin) .[name].. 1 by mouth at bedtime 11)  Meclizine Hcl 25 Mg Tabs (Meclizine hcl) .[name].. 1 by mouth every 6 hrs as needed vertigo 12)  Voltaren 1 % Gel (Diclofenac sodium) .... Up to 4x/day as needed for knee pain.  disp 1 large tube  Patient Instructions: 1)  We will call you with your ortho appt 2)  Take the Meclizine as needed for the dizziness- this condition is called Benign Positional Vertigo and should improve w/ time 3)  Drink plenty of fluids 4)  Use the Voltaren gel for your knee pain 5)  Ice as needed for pain relief 6)  Call with any questions or concerns 7)  Hang in there! Prescriptions: VOLTAREN 1 %  GEL (DICLOFENAC SODIUM) up to 4x/day as needed for knee pain.  disp 1 large tube  #  1 x 3   Entered and Authorized by:   Neena Rhymes MD   Signed by:   Neena Rhymes MD on 09/30/2010   Method used:   Electronically to        [id number] (retail)       [address]       [place], [place]  [address]       Ph: [phone] or [phone]       Fax: [phone]   RxID:   [id number] MECLIZINE HCL 25 MG TABS (MECLIZINE HCL) 1 by mouth every 6 hrs as needed vertigo  #45 x 0   Entered and Authorized by:   Neena Rhymes MD   Signed by:   Neena Rhymes MD on 09/30/2010   Method used:   Electronically to        [id number] (retail)       [address]       [place], [place]  [address]       Ph: [phone] or [phone]       Fax: [phone]   RxID:   [id number]    Orders Added: 1)  Est. Patient Level IV [[address]]

## 2010-10-13 NOTE — Progress Notes (Signed)
Summary: Voltaren denied  Phone Note Other Incoming   Summary of Call: The office received fax from [facility] that patient Voltaren gel was denied. Please review the papers at MD station and advise.  Initial call taken by: Lucious Groves CMA,  October 05, 2010 10:33 AM  Follow-up for Phone Call        did peer-to-peer physician review and Voltaren gel was approved for 30 day use.  please call pt and let her know- she should check w/ pharmacy prior to pick up to make sure the approval has been faxed. Follow-up by: Neena Rhymes MD,  October 05, 2010 1:10 PM  Additional Follow-up for Phone Call Additional follow up Details #1::        Patient notified and states that she had already paid for it OOP and did get a little discount for paying cash. I advised the patient to go ahead and pick this prescription up that the insurance company would cover. Patient expressed understanding and will pick it up later. Additional Follow-up by: Lucious Groves CMA,  October 05, 2010 1:29 PM

## 2010-10-13 NOTE — Progress Notes (Signed)
Summary: Prior Auth--Voltaren  Phone Note Refill Request Message from:  Pharmacy on October 03, 2010 9:18 AM  Refills Requested: Medication #1:  VOLTAREN 1 %  GEL up to 4x/day as needed for knee pain.  disp 1 large tube.   Dosage confirmed as above?Dosage Confirmed [facility] [address]   Initial call taken by: Harold Barban,  October 03, 2010 9:18 AM  Follow-up for Phone Call        [facility] and was directed to call [employer] at [phone] (option 2). Forms requested. Lucious Groves CMA  October 03, 2010 11:07 AM   Form received and pending MD completion. Lucious Groves CMA  October 03, 2010 3:15 PM   Additional Follow-up for Phone Call Additional follow up Details #1::        Form completed and faxed, will await insurance company reply. Lucious Groves CMA  October 05, 2010 8:16 AM      Appended Document: Prior Auth--Voltaren Denied, see phone note.

## 2010-10-19 NOTE — Medication Information (Signed)
Summary: PA and Approval for Voltaren Gel  PA and Approval for Voltaren Gel   Imported By: Maryln Gottron 10/10/2010 13:20:00  _____________________________________________________________________  External Attachment:    Type:   Image     Comment:   External Document

## 2010-12-06 LAB — BASIC METABOLIC PANEL
CO2: 27 mEq/L (ref 19–32)
Calcium: 8.6 mg/dL (ref 8.4–10.5)
Calcium: 8.6 mg/dL (ref 8.4–10.5)
Calcium: 8.6 mg/dL (ref 8.4–10.5)
Chloride: 108 mEq/L (ref 96–112)
Creatinine, Ser: 1.12 mg/dL (ref 0.4–1.2)
GFR calc Af Amer: 60 mL/min — ABNORMAL LOW (ref >60)
GFR calc Af Amer: >60 mL/min (ref >60)
GFR calc non Af Amer: 49 mL/min — ABNORMAL LOW (ref >60)
GFR calc non Af Amer: >60 mL/min (ref >60)
Glucose, Bld: 131 mg/dL — ABNORMAL HIGH (ref 70–99)
Potassium: 3.4 mEq/L — ABNORMAL LOW (ref 3.5–5.1)
Potassium: 4.1 mEq/L (ref 3.5–5.1)
Sodium: 137 mEq/L (ref 135–145)
Sodium: 140 mEq/L (ref 135–145)
Sodium: 140 mEq/L (ref 135–145)

## 2010-12-06 LAB — HEMOGLOBIN AND HEMATOCRIT, BLOOD: HCT: 39.8 % (ref 36.0–46.0)

## 2010-12-06 LAB — TYPE AND SCREEN
ABO/RH(D): O NEG
Antibody Screen: NEGATIVE

## 2010-12-07 LAB — CBC
HCT: 42.5 % (ref 36.0–46.0)
Hemoglobin: 14.5 g/dL (ref 12.0–15.0)
MCV: 93.4 fL (ref 78.0–100.0)
Platelets: 281 10*3/uL (ref 150–400)
RDW: 12.2 % (ref 11.5–15.5)

## 2010-12-07 LAB — BASIC METABOLIC PANEL
BUN: 13 mg/dL (ref 6–23)
CO2: 32 mEq/L (ref 19–32)
Chloride: 102 mEq/L (ref 96–112)
Glucose, Bld: 129 mg/dL — ABNORMAL HIGH (ref 70–99)
Potassium: 3.3 mEq/L — ABNORMAL LOW (ref 3.5–5.1)
Sodium: 143 mEq/L (ref 135–145)

## 2011-01-10 NOTE — Discharge Summary (Signed)
NAMECLEMENCIA, [name]                 ACCOUNT NO.:  [id number]   MEDICAL RECORD NO.:  [id number]          PATIENT TYPE:  INP   LOCATION:  1425                         FACILITY:  [facility]   PHYSICIAN:  Heloise Purpura, MD      DATE OF BIRTH:  10-Apr-1946   DATE OF ADMISSION:  12/28/2008  DATE OF DISCHARGE:  12/31/2008                               DISCHARGE SUMMARY   ADMISSION DIAGNOSIS:  Left renal mass.   DISCHARGE DIAGNOSIS:  Multilocular cystic nephroma.   PROCEDURE:  Left laparoscopic radical nephrectomy.   HISTORY AND PHYSICAL:  For full details, please see admission history  and physical.  Briefly, Ms. Carter is a 65 year old female who was found  to have a large complex left renal mass that did appear to have  components that were likely enhancing and concerning for a renal cell  carcinoma.  After undergoing a negative metastatic evaluation and  discussing the potential management options for treatment she elected to  proceed with the above-named procedure.   HOSPITAL COURSE:  On Dec 28, 2008, she was taken to the operating room  and underwent a left laparoscopic radical nephrectomy in which she  tolerated well and without complications.  Postoperatively, she was able  to be transferred to a regular hospital room where she was monitored  over night and remained hemodynamically stable.  She was maintained on  strict bedrest for 24 hours.  Her serum creatinine did increase on  postoperative day #1 to 1.12 from postoperatively at 0.88, both of which  were normal.  She was able to begin a clear liquid diet on postoperative  day #1 and which she did have some nausea and vomiting.  She also had  nausea and vomiting with some of the narcotic medications, therefore  medications were changed and antiemetics were given.  She at that time  was able to tolerate her clear liquid diet with some nausea but no  further vomiting.  She was able to be transitioned to a regular diet  over the next 24-48  hours.  She began ambulating after bedrest for 24  hours and which she did without difficulty.  She was also able to be  transitioned to oral pain medications.  She maintained excellent urine  output within the first 24 hours after surgery, therefore her Foley  catheter was removed.  On postoperative day #2, she tolerated her diet  well with some nausea at times and did become tearful and somewhat  emotional at being discharged too early, along with her pain which was  not completely under control.  On postoperative day #3, she was  tolerating her diet well, ambulating without difficulty and her pain was  well controlled.  She was, therefore, able to be discharged home in  excellent condition.   DISPOSITION:  To home.   DISCHARGE MEDICATIONS:  She was instructed to resume her regular home  medications including Vytorin, metoprolol, levothyroxine, amlodipine.  She was instructed to resume all aspirin and herbal supplements within 7  days.  She was given a prescription for Vicodin to take as needed for  pain and told to use Colace as a stool softener.   DISCHARGE INSTRUCTIONS:  She was instructed to be ambulatory,  specifically told to refrain from any heavy lifting or strenuous  activity or driving.  She was also instructed to advance her diet as  tolerated, to resume a regular diet.   FOLLOWUP:  She will return in 10-14 days for further postoperative  evaluation.   PATHOLOGY:  Her pathology returned as a cystic nephroma 9.3 cm with  renal calculi. This was discussed with the patient prior to her  discharge.      Delia Chimes, NP      Heloise Purpura, MD  Electronically Signed    MA/MEDQ  D:  12/31/2008  T:  12/31/2008  Job:  [id number]

## 2011-01-10 NOTE — Op Note (Signed)
NAMESHIREE, ALTEMUS                 ACCOUNT NO.:  [id number]   MEDICAL RECORD NO.:  [id number]          PATIENT TYPE:  INP   LOCATION:  1425                         FACILITY:  [facility]   PHYSICIAN:  Heloise Purpura, MD      DATE OF BIRTH:  May 31, 1946   DATE OF PROCEDURE:  12/28/2008  DATE OF DISCHARGE:                               OPERATIVE REPORT   PREOPERATIVE DIAGNOSIS:  Left renal mass.   POSTOPERATIVE DIAGNOSIS:  Left renal mass.   OPERATION PERFORMED:  Left laparoscopic radical nephrectomy.   SURGEON:  Heloise Purpura, M.D.   ASSISTANT:  Delia Chimes, NP   ANESTHESIA:  General.   COMPLICATIONS:  None.   ESTIMATED BLOOD LOSS:  The estimated blood loss was 20 mL.   FLUIDS REPLACED:  Intravenous fluid was 2500 mL of lactated Ringer's.   SPECIMENS:  Left kidney and proximal ureter.   DISPOSITION:  Specimens to pathology.   INDICATIONS:  The patient is a 65 year old female who was found to have  a large complex left renal mass that did appear to have components that  were likely enhancing and concerning for a renal cell carcinoma.  After  undergoing a negative metastatic evaluation, we discussed the potential  management options for treatment and she elected to proceed with the  above procedure.  The potential risks, complications, and alternative  treatment options were discussed in detail, and informed consent was  obtained.   DESCRIPTION OF THE OPERATION:  The patient was brought to the operating  room and a general anesthetic was administered.  She was given  preoperative antibiotics, placed in the left modified flank position,  and prepped and draped in the usual sterile fashion.  Next, a  preoperative time-out was performed.  A site was selected just superior  to the umbilicus for placement of the camera port.  This was placed  using a standard open Hassan technique, which allowed entry into the  peritoneal cavity under direct vision without difficulty.  Two-0  Vicryl  holding stitches were then placed in the rectus fascia and the Hassan  cannula was secured with these fascial sutures.   The abdomen was insufflated and the 30-degree camera lens was used to  inspect the abdomen.  There was no evidence of any intra-abdominal  injuries or other abnormalities.  A 5-mm port was then placed into the  left upper quadrant and a 12-mm port was placed into the left lower  quadrant.  These ports were placed under direct vision and without  difficulty.  The white line of Toldt was then incised along the length  of the descending colon allowing the colon be mobilized medially; and,  the space between the mesocolon and anterior layer of Gerota's fascia to  be developed.  The patient's large anterior cystic mass was readily  identifiable.   The patient had undergone a prior left ureterolithotomy in the past and  there was noted to be significant scar in the vicinity of the left  ureter and psoas muscle.  The left ureter was able to be identified and  was carefully dissected  free from the psoas muscle with the aid of the  harmonic scalpel.  Dissection proceeded superiorly; and, the main renal  vein was identified and cleared of overlying tissue with the aid of  blunt dissection as well as dissection with the harmonic scalpel.  Further dissection of the renal hilum revealed there to be a single  renal artery just posterior to the renal vein.  This was able to be  isolated with right-angle dissection and was divided between multiple 10-  mm Hem-o-lok clips.  The renal vein was also isolated with right-angle  dissection and a 45-mm flex ETS stapler with a vascular staple load was  placed across the renal vein and the renal vein was stapled and divided  resulting in excellent hemostasis.   Gerota's fascia was intentionally entered superiorly allowing the left  adrenal gland to be spared.  The ureter was divided between  two Hem-O-  Lok clips and the lateral,  posterior, and superior attachments of the  kidney were then carefully taken down the harmonic scalpel allowing the  specimen to be freely mobile.  The left renal fossa was then carefully  examined and there appeared to be excellent hemostasis.  At this point a  0-Vicryl closing stitch was placed through the rectus fascia of the 12-  mm port site for later closure.  The 15-mm EndoCatch II bag was then  placed through the original [place] port site and the specimen was placed  into the EndoCatch II bag for removal.  The original camera port site  was then extended in a periumbilical fashion, allowing the specimen to  be removed intact.  This fascial opening was then closed with two  running #1 PDS sutures.   The abdomen was then reinsufflated and examined.  There was noted to be  no evidence of any bleeding and the left renal fossa was irrigated.  The  remaining ports were then removed under direct vision.  The 12-mm port  site was closed with the previously placed 0-Vicryl suture after the  pneumoperitoneum was expelled.  All incision sites were injected with  one-quarter percent Marcaine and the periumbilical incision site was  copiously irrigated.  All incisions were then reapproximated at the skin  level with staples.  Sterile dressings were applied.   The patient appeared to tolerate the procedure well and without  complications.  She was able to be taken to the recovery room in  satisfactory condition.      Heloise Purpura, MD  Electronically Signed     LB/MEDQ  D:  12/28/2008  T:  12/29/2008  Job:  [id number]

## 2011-01-12 ENCOUNTER — Encounter: Payer: Self-pay | Admitting: Internal Medicine

## 2011-01-13 ENCOUNTER — Ambulatory Visit (INDEPENDENT_AMBULATORY_CARE_PROVIDER_SITE_OTHER): Payer: Medicare Other | Admitting: Internal Medicine

## 2011-01-13 ENCOUNTER — Encounter: Payer: Self-pay | Admitting: Internal Medicine

## 2011-01-13 DIAGNOSIS — E782 Mixed hyperlipidemia: Secondary | ICD-10-CM

## 2011-01-13 NOTE — Assessment & Plan Note (Signed)
NMR Lipoprofile 2010: LDL ? ( 1897/1300), HDL 41, TG 464. Mother had CVA in 83s; father had CVA in 24s.No FH premature CAD/MI

## 2011-01-13 NOTE — Progress Notes (Signed)
  Subjective:    Patient ID: [name], female    DOB: [birth year], 65 y.o.   MRN: [id number]  HPI Dyslipidemia assessment: Lab results reviewed : major risk is LDL of 203.   Prior Advanced Lipid Testing: NMR reviewed.   Family history of premature CAD/ MI: no . Nutrition: avoiding white carbs &  HFCS  .  Exercise: limited . Diabetes : A1c 5.5% .  Smoking history  : quit 1976 .  HTN:  controlled  .   Weight :  stable. ROS: fatigue: no ; chest pain : no ;claudication: no; palpitations: no; abd pain/bowel changes: no ; myalgias:no;  syncope : no ; memory loss: no;skin changes: no.     Review of Systems     Objective:   Physical Exam Heart:  Normal rate and regular rhythm. S1 and S2 normal without gallop, murmur, click, rub or other extra sounds S4 with slight slurring.Lungs:Chest clear to auscultation; no wheezes, rhonchi,rales ,or rubs present.No increased work of breathing.  All pulses intact without  bruits .No ischemic skin changes.          Assessment & Plan:  #1 dyslipidemia; major risk is the LDL over 200.  No family history of premature coronary disease  Plan: Samples of Crestor for 10 weeks. Fasting routine lipid panel, CK, and hepatic panel will be done in 9 weeks.

## 2011-01-13 NOTE — Discharge Summary (Signed)
[facility]  Patient:    [name], [name] Visit Number: [id number] MRN: [id number]          Service Type: SUR Location: 4W 0445 02 Attending Physician:  Collene Schlichter Dictated by:   Almedia Balls Randell Patient, M.D. Admit Date:  02/18/2002 Discharge Date: 02/19/2002                             Discharge Summary  HISTORY OF PRESENT ILLNESS:  The patient is a 65 year old with persistent abnormal bleeding, uterine enlargement, pelvic pain for hysterectomy and bilateral salpingo-oophorectomy.  The remainder of her history and physical are as previously dictated.  LABORATORY DATA:  Laboratory data include BMET panel which was normal, hemoglobin 14.8, chest x-ray and electrocardiogram which were both normal.  HOSPITAL COURSE:  The patient was taken to the operating room on February 18, 2002 in which time abdominal supracervical hysterectomy, bilateral salpingo-oophorectomy, extensive lysis of adhesions were performed.  The patient did well postoperatively.  Diet and ambulation were progressed over the evening of February 18, 2002 and early morning of February 19, 2002.  On the morning of February 19, 2002, she was afebrile and experiencing no problems except for pain which was managed by oral analgesics.  It was felt that she could be discharged at this time.  FINAL DIAGNOSES: 1. Abnormal uterine bleeding. 2. Pelvic pain. 3. Extensive pelvic adhesions. 4. Uterine enlargement.  OPERATION:  Abdominal supracervical hysterectomy with bilateral salpingo-oophorectomy, extensive lysis of adhesions.  PATHOLOGY:  Pathology report is unavailable at the time of dictation.  DISPOSITION:  Discharged home to return to the office in 2 weeks for followup. She was instructed to gradually progress her activities and to limit lifting and driving for 2 weeks.  She was fully ambulatory, on a regular diet, and in good condition at the time of discharge.  She was given prescriptions  for Dilaudid or generic 2 mg (#30) to be taken one or two q.4h. p.r.n. pain, doxycycline 100 mg (#12) to be taken one b.i.d. and Premarin 0.625 mg (#30) to be taken one h.s. Dictated by:   Almedia Balls Randell Patient, M.D. Attending Physician:  Collene Schlichter DD:  02/19/02 TD:  02/20/02 Job: [id number]

## 2011-01-13 NOTE — Op Note (Signed)
[facility]  Patient:    [name], [name] Visit Number: [id number] MRN: [id number]          Service Type: SUR Location: 4W 0445 02 Attending Physician:  Collene Schlichter Dictated by:   Almedia Balls Randell Patient, M.D. Proc. Date: 02/18/02 Admit Date:  02/18/2002 Discharge Date: 02/19/2002   CC:         Harl Bowie, M.D.   Operative Report  PREOPERATIVE DIAGNOSES: 1. Uterine enlargement. 2. Abnormal uterine bleeding. 3. Pelvic pain.  POSTOPERATIVE DIAGNOSES: 1. Uterine enlargement. 2. Abnormal uterine bleeding. 3. Pelvic pain. 4. Extensive pelvic adhesions.  OPERATION:  Abdominal cervical hysterectomy, bilateral salpingo-oophorectomy, extensive lysis of adhesions.  ANESTHESIA:  General orotracheal.  SURGEON:  Almedia Balls. Randell Patient, M.D.  FIRST ASSISTANT:  Harl Bowie, M.D.  INDICATIONS FOR PROCEDURE:  The patient is a 65 year old with the above noted problems and was counseled as to the need for surgery to treat these problems. She was fully counseled as to the nature of the procedure and the risks involved including the risks of anesthesia, injury to bowel, bladder, blood vessels, ureters, postoperative hemorrhage, infection, recuperation, and continuation of estrogen replacement only.  She fully understands all these considerations and wishes to proceed on February 18, 2002.  OPERATIVE FINDINGS:  On entry into the abdomen, the upper abdomen viscera were normal to palpation.  The area of the appendix was also normal.  The uterus was mid posterior and quite adherent to the bladder anteriorly.  Both adnexal areas were quite adherent to the loops of bowel and to the posterolateral peritoneal surfaces.  DESCRIPTION OF PROCEDURE:  With the patient under general anesthesia, prepared and draped in the usual sterile fashion, and with a Foley catheter in the bladder, a lower abdominal transverse incision was made after excising the previous C-section scar.   This was carried into the peritoneal cavity without difficulty.  A self-retaining retractor was placed and the bowel was packed off.  It was possible to grasp the tubes, utero-ovarian anastomoses, and round ligaments bilaterally for traction and hemostasis.  It was necessary to do extensive lysis of adhesions involving the adnexal structures and loops of bowel.  The round ligaments were then transected with entry into the retroperitoneal space and partial creation of a bladder flap anteriorly.  The infundibulopelvic ligaments were then isolated, clamped, cut, and doubly ligated with 1 chromic catgut.  This was done lateral to the ovaries so that the ovaries could be removed.  This was accomplished on the right side without difficulty.  However, on the left, it was necessary to leave the ovary because of the dense adhesions to proceed with the hysterectomy.  A bladder flap was then developed on the anterior surface of the uterus using sharp and Bovie dissection.  This area was quite adherent as well and required tedious dissection and careful dissection to prevent injury to the bladder.  Heaney clamps were then used to clamp the uterine vessels, cardinal ligaments, and partially the uterosacral ligaments bilaterally; these structures were then individually cut free and individually suture ligated with 1 chromic catgut. It was then possible to remove the uterine fundus.  This was accomplished using Bovie electrocoagulation without difficulty.  The endocervix was then removed using Bovie electrocoagulation as well.  The cervix was reapproximated and rendered hemostatic with interrupted figure-of-eight sutures of #1 chromic catgut.  The left tube and ovary were then identified and the adhesions involving these structures were gradually lysed using sharp dissection and Bovie electrocoagulation.  Again, care was taken to avoid injury to the bowel and ureters below the area of dissection.  It was  then possible to isolate the infundibulopelvic ligament which was then clamped, cut, and doubly ligated with 1 chromic catgut.  The left tube and ovary were then excised carefully from the left peritoneal surface area.  The area was then lavaged with copious amounts of lactated Ringers solution, and after noting that hemostasis was maintained, and the sponge and instrument counts were correct, the peritoneum was closed with a continuous suture of 0 Vicryl.  The fascia was closed with two sutures of 0 Vicryl which were brought from the lateral aspects of the incision and divided separately in the midline.  Subcutaneous fat was reapproximated with interrupted sutures of 0 Vicryl.  The skin was closed with a subcuticular suture of 3-0 plain catgut.  The estimated blood loss was 150 mL.  The patient was taken to the recovery room in good condition with clear urine in the Foley catheter.  She will be placed on 23-hour observation following surgery. Dictated by:   Almedia Balls Randell Patient, M.D. Attending Physician:  Collene Schlichter DD:  02/19/02 TD:  02/20/02 Job: [id number]

## 2011-01-13 NOTE — Patient Instructions (Signed)
Repeat routine fasting lipid panel, hepatic panel and CK after being on the medicine 9 weeks. Codes 272.4, 995.2. The most common cause of elevated triglycerides is the ingestion of sugar from high fructose corn syrup sources. You should consume less than 40 grams  of sugar per day from foods and drinks with high fructose corn syrup as number 2, 3, or #4 on the label. As TG go up, HDL or good cholesterol goes down. Also uric acid which causes gout will go up.

## 2011-01-13 NOTE — H&P (Signed)
[facility]  Patient:    [name], [name] Visit Number: [id number] MRN: [id number]          Service Type: Attending:  Almedia Balls. Randell Patient, M.D. Dictated by:   Almedia Balls Randell Patient, M.D. Adm. Date:  02/18/02                           History and Physical  CHIEF COMPLAINT:  Abnormal bleeding, uterine enlargement.  HISTORY OF PRESENT ILLNESS:  The patient is a 65 year old who is essentially in menopause.  She has been maintained on hormone replacement therapy over several years.  During this time it has been noted that her uterus has enlarged greatly.  Ultrasounds also have shown lucent areas in the myometrium which are suggestive of adenomyosis.  She underwent hysteroscopy, D&C in April 2002, with benign findings and was observed following that with attempts to suppress any bleeding with high-dose progestational agent.  She has continued to have abnormal bleeding and pain with this.  Her uterus on examination in May 2003, was found to be approximately 12-[redacted] weeks gestational size. Ultrasound at this time revealed probable adenomyosis within the myometrium and probable fibroids as well.  She is admitted at this time for hysterectomy, bilateral salpingo-oophorectomy.  This procedure has been discussed fully with the patient to include the nature of the procedure and the risks involved to include risks of anesthesia, injury to bowel, bladder, ureters, vessels, hemorrhage, infection, recuperation.  She fully understands all these considerations and wishes to proceed on February 18, 2002.  PAST MEDICAL HISTORY AND MEDICATIONS: 1. Thyroidectomy for which she is maintained on Synthroid 100 mcg daily. 2. History of hypertension and takes Norvasc 5 mg daily and Toprol XL 50 mg    daily. 3. She is also on Lipitor for increased cholesterol. 4. She has been on hormone replacement, as noted above.  ALLERGIES:  She is allergic to CODEINE and PENICILLIN.  FAMILY HISTORY:  Father had  hypertensive cardiovascular disease and stroke in the past.  REVIEW OF SYSTEMS:  HEENT:  Wears glasses.  CARDIORESPIRATORY:  As noted above.  GASTROINTESTINAL:  Negative.  GENITOURINARY:  As noted above. NEUROMUSCULAR:  Negative.  PHYSICAL EXAMINATION:  VITAL SIGNS:  Height 5 feet 6 inches, weight 152 pounds.  Blood pressure 138/84, pulse 76, respirations 18.  GENERAL:  Well-developed white female in no acute distress.  HEENT:  Within normal limits.  NECK:  Supple.  Without masses, adenopathy, or bruits.  HEART:  Regular rate and rhythm.  Without murmurs.  LUNGS:  Clear to P&A.  BREASTS:  Examined sitting and lying, without mass.  Axilla negative.  PELVIC:  External genitalia, Bartholin, urethral, and Skenes glands within normal limits.  Cervix is slightly inflamed.  Uterus mid position, approximately 12-[redacted] weeks gestational size, irregular.  There are no palpable adnexal masses.  RECTOVAGINAL:  Confirmatory.  EXTREMITIES:  Within normal limits.  CENTRAL NERVOUS SYSTEM:  Grossly intact.  SKIN:  Without suspicious lesions.  IMPRESSION:  Abnormal uterine bleeding, probable fibroids, rule out adenomyosis.  PLAN:  As noted above.  Recent Pap smear has been normal. Dictated by:   Almedia Balls. Randell Patient, M.D. Attending:  Almedia Balls. Randell Patient, M.D. DD:  02/06/02 TD:  02/09/02 Job: [id number]

## 2011-01-13 NOTE — Op Note (Signed)
[place]. [facility]  Patient:    [name], [name]                        MRN: [id number] Proc. Date: 10/06/99 Adm. Date:  [id number] Attending:  Gennie Alma CC:         Titus Dubin. Alwyn Ren, M.D. [facility]                           Operative Report  CCS# [id number]  PREOPERATIVE DIAGNOSIS:  Nodule of left lobe of thyroid--palpable, and nonpalpable nodule of right lobe of thyroid.  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  OPERATION PERFORMED:  Total thyroidectomy.  Biopsy of right superior parathyroid gland and autotransplantation of right superior parathyroid gland.  SURGEON:  Milus Mallick, M.D.  ASSISTANT:  Gita Kudo, M.D.  ANESTHESIA:  General endotracheal.  DESCRIPTION OF PROCEDURE:  Under adequate general endotracheal anesthesia, the patients neck was prepared and draped in the usual fashion.  A low collar incision was made and carried down through the platysma muscle.  Superior and inferior platysmal flaps were developed with Bovie electrocoagulation.  A Mahorner self-retaining retractor was inserted.  The middle cervical fascia was incised longitudinally.  There was a mass in the left lobe of the thyroid gland.  The left sternohyoid muscle was divided with Bovie electrocoagulation.  The sternothyroid muscle was attached to the left thyroid lobe in one place and the sternothyroid  muscle was divided above and below the place that the muscle was attached in order to leave a portion of the muscle attached with the specimen.  The left thyroid lobe was exposed and retracted medially.  The middle thyroid vein was divided over small clips, allowing for more retraction.  The left inferior parathyroid gland was identified and the lobe was dissected off of it over small clips.  Next, the inferior pole attachments were divided over hemostats which were ligated with 4-0 silk.  The inferior thyroid artery  was divided over clips also, freeing up the lobe even further.  Next, the superior pole vessels were divided  over triple hemoclip technique.  The superior parathyroid gland was identified nd also spared.  The left recurrent laryngeal nerve was identified and the left thyroid lobe was dissected off of it over hemostats which were ligated with 4-0  silk in some instances and small hemoclips and other instances.  The remaining attachments to the trachea were divided with Bovie electrocoagulation and the isthmus was divided over hemostats which were ligated with 2-0 silk.  The specimen was removed from the operative field.  There was a long tie left on the superior pole.  It was sent for frozen section study which suggested a follicular neoplasm which could not be determined whether it was benign or malignant.  The right lobe of the thyroid was normal to palpation.  However, it was known that it had a nodule within it and with the diagnosis on frozen section, it was elected to do a total thyroidectomy.  The right lobe of the thyroid was retracted medially and the strap muscles were  dissected off of it and retracted.  The right middle thyroid vein was divided over hemostats which were ligated with 4-0 silk.  The specimen was rolled medially and the right inferior parathyroid gland was identified.  It was dissected off of the thyroid tissue and spared of any injury.  A dissection was done over small clips. Next, the superior parathyroid gland was also identified and it was noted that t was taking its blood supply from the thyroid gland only.  There were no other vessels coming into it and sacrifice of the right thyroid gland meant devascularization of the superior parathyroid gland.  It was therefore excised ver a small clip.  A port was sent for frozen section study to confirm and the remainder was saved in order to autotransplant it.  The superior pole vessels were divided  over ligature and clip left on the stay ide and clip on the specimen side.  The recurrent laryngeal nerve was identified on the right and the inferior thyroid artery and the branches of the artery were clipped with small clips right at the level of the thyroid gland.  Inferior pole attachments were divided over hemostats which were ligated with 2-0 silk.  The specimen was then dissected off of the trachea using Bovie electrocoagulation and the specimen was removed from the operative field. It was sent for routine pathologic study.  There were no enlarged lymph nodes that were encountered.  Hemostasis was ascertained.  The sternohyoid muscle on the left was repaired with interrupted horizontal mattress sutures of 4-0 Vicryl.  The strap muscles were hen reapproximated in the midline with interrupted sutures of 4-0 Vicryl.  Next, the right superior parathyroid gland was diced into two portions and autotransplanted into a pocket in the right sternocleidomastoid muscle.  It was marked with a clip after closing the pocket with 4-0 Vicryl.  The platysma muscle was reapproximated with interrupted sutures of 4-0 Vicryl and the skin was approximated with the generic skin stapler 35-W.  A sterile dressing was applied.  Estimated blood loss for this procedure was negligible.  The patient tolerated the procedure well and left the operating room in satisfactory condition. DD:  10/06/99 TD:  10/06/99 Job: [id number]

## 2011-01-13 NOTE — Op Note (Signed)
NAME:  [name], [name]                           ACCOUNT NO.:  [id number]   MEDICAL RECORD NO.:  [id number]                   PATIENT TYPE:  AMB   LOCATION:  DSC                                  FACILITY:  [facility]   PHYSICIAN:  Alfredia Ferguson, M.D.               DATE OF BIRTH:  1946/06/04   DATE OF PROCEDURE:  07/09/2003  DATE OF DISCHARGE:                                 OPERATIVE REPORT   PREOPERATIVE DIAGNOSIS:  1. 7 mm dermal lesion of uncertain behavior, left upper medial calf.  2. 1.5 cm transversely oriented scar upper lip from previous injury.   POSTOPERATIVE DIAGNOSIS:  1. 7 mm dermal lesion of uncertain behavior, left upper medial calf.  2. 1.5 cm transversely oriented scar upper lip from previous injury.   OPERATION PERFORMED:  1. Excision lesion left upper calf.  2. Scar revision upper lip.   SURGEON:  Alfredia Ferguson, M.D.   ANESTHESIA:  2% Xylocaine with 1:100,000 epinephrine.   INDICATIONS FOR PROCEDURE:  This is a 65 year old woman who has a slowly  enlarging lesion in the left upper medial calf.  It is approximately 6-7 mm  in diameter, it is pigmented and very hard.  She wishes to have it removed.  She understands she is trading what she has for permanent and potentially  unsightly scar.  In addition, the patient has an unsightly scar transversely  oriented in the mid portion of her upper lip.  The scar is between the base  of the columella and the vermillion border.  It crosses both filtrum  columns.  The scar is secondary to an injury she sustained in May 2004 after  she fell against some steps.  She wishes to have this scar revised.  She  understands that the new scar may be identical to the existing one and that  she may not get any improvement.   DESCRIPTION OF PROCEDURE:  Elliptical skin marks were placed around the  lesion in the left upper medial calf and around the scar on the upper lip.  Local anesthesia was infiltrated.  The left calf was prepped  with Betadine  and draped with sterile drapes.  An elliptical excision of the lesion was  carried out down to the level of the subcutaneous tissue.  The specimen was  passed off for pathology.  The wound edges were undermined for a distance of  several mm in all directions.  Hemostasis was accomplished using pressure.  The wound was closed by reapproximating the dermis using interrupted 4-0  Monocryl suture.  The skin was united using a running 5-0 nylon suture.   The upper lip was prepped with Betadine and draped with sterile drapes.  An  elliptical excision of the scar was carried out.  The wound edges were  undermined for a distance of several mm in all directions.  The scar which  was deep to  the dermis was released.  The dermis was closed using  interrupted 5-0 Monocryl suture.  The skin edges were closed using multiple  interrupted 6-0 nylon sutures.  Both areas were now cleansed, dried, and  light dressings were applied.  The patient tolerated the procedure well and  was discharged to home in satisfactory condition.                                              Alfredia Ferguson, M.D.   WBB/MEDQ  D:  07/09/2003  T:  07/09/2003  Job:  [id number]

## 2011-03-16 ENCOUNTER — Other Ambulatory Visit: Payer: Self-pay | Admitting: Internal Medicine

## 2011-03-16 DIAGNOSIS — T887XXA Unspecified adverse effect of drug or medicament, initial encounter: Secondary | ICD-10-CM

## 2011-03-16 DIAGNOSIS — E785 Hyperlipidemia, unspecified: Secondary | ICD-10-CM

## 2011-03-17 ENCOUNTER — Other Ambulatory Visit: Payer: Self-pay | Admitting: Internal Medicine

## 2011-03-17 ENCOUNTER — Telehealth: Payer: Self-pay

## 2011-03-17 ENCOUNTER — Other Ambulatory Visit (INDEPENDENT_AMBULATORY_CARE_PROVIDER_SITE_OTHER): Payer: Medicare Other

## 2011-03-17 DIAGNOSIS — T887XXA Unspecified adverse effect of drug or medicament, initial encounter: Secondary | ICD-10-CM

## 2011-03-17 DIAGNOSIS — E785 Hyperlipidemia, unspecified: Secondary | ICD-10-CM

## 2011-03-17 LAB — LDL CHOLESTEROL, DIRECT: Direct LDL: 122.2 mg/dL

## 2011-03-17 LAB — HEPATIC FUNCTION PANEL
ALT: 22 U/L (ref 0–35)
Alkaline Phosphatase: 97 U/L (ref 39–117)
Bilirubin, Direct: 0 mg/dL (ref 0.0–0.3)
Total Bilirubin: 0.8 mg/dL (ref 0.3–1.2)
Total Protein: 8.4 g/dL — ABNORMAL HIGH (ref 6.0–8.3)

## 2011-03-20 NOTE — Progress Notes (Signed)
Labs only

## 2011-03-23 NOTE — Telephone Encounter (Signed)
Called patient to schedule recall Colonoscopy and patient states she will call back to schedule after she gets back from vacation. Informed patient of the importance of scheduling the procedure. Pt verbalized understanding.

## 2011-03-30 ENCOUNTER — Other Ambulatory Visit: Payer: Self-pay | Admitting: Internal Medicine

## 2011-03-30 MED ORDER — LORAZEPAM 1 MG PO TABS: ORAL_TABLET | ORAL | Status: DC

## 2011-03-30 NOTE — Telephone Encounter (Signed)
RX sent to pharmacy  

## 2011-04-11 ENCOUNTER — Other Ambulatory Visit: Payer: Self-pay | Admitting: Internal Medicine

## 2011-04-11 NOTE — Telephone Encounter (Signed)
Dr.Hopper please advise on what dose of Crestor to be called in, not noted

## 2011-04-11 NOTE — Telephone Encounter (Signed)
Please verify with her exact dose if not in EMR. Thanks

## 2011-04-12 MED ORDER — ROSUVASTATIN CALCIUM 20 MG PO TABS: 20.0000 mg | ORAL_TABLET | Freq: Every day | ORAL | Status: DC

## 2011-04-12 NOTE — Telephone Encounter (Signed)
Spoke with patient and she states it was in a blue packet. (which is the 20mg )

## 2011-06-14 ENCOUNTER — Other Ambulatory Visit: Payer: Self-pay | Admitting: Internal Medicine

## 2011-06-14 MED ORDER — LORAZEPAM 1 MG PO TABS: ORAL_TABLET | ORAL | Status: DC

## 2011-06-14 NOTE — Telephone Encounter (Signed)
rx sent

## 2011-06-20 ENCOUNTER — Other Ambulatory Visit: Payer: Self-pay

## 2011-06-20 MED ORDER — ZOSTER VACCINE LIVE 19400 UNT/0.65ML ~~LOC~~ SOLR: 0.6500 mL | Freq: Once | SUBCUTANEOUS | Status: DC

## 2011-06-20 NOTE — Telephone Encounter (Signed)
Patient with pending appointment on Wed for flu vaccine, patient will pick up rx at that time. Shingles VIS attached to rx and placed at the front for pick-up

## 2011-06-21 ENCOUNTER — Ambulatory Visit (INDEPENDENT_AMBULATORY_CARE_PROVIDER_SITE_OTHER): Payer: Medicare Other

## 2011-06-21 DIAGNOSIS — Z23 Encounter for immunization: Secondary | ICD-10-CM

## 2011-08-25 ENCOUNTER — Other Ambulatory Visit: Payer: Self-pay | Admitting: Internal Medicine

## 2011-09-01 ENCOUNTER — Other Ambulatory Visit: Payer: Self-pay | Admitting: Internal Medicine

## 2011-09-01 NOTE — Telephone Encounter (Signed)
rx sent to pharmacy by e-script  

## 2011-09-05 ENCOUNTER — Other Ambulatory Visit: Payer: Self-pay | Admitting: Internal Medicine

## 2011-09-05 MED ORDER — LORAZEPAM 1 MG PO TABS: ORAL_TABLET | ORAL | Status: DC

## 2011-09-05 NOTE — Telephone Encounter (Signed)
RX sent

## 2011-09-14 ENCOUNTER — Other Ambulatory Visit: Payer: Self-pay | Admitting: Internal Medicine

## 2011-10-07 ENCOUNTER — Other Ambulatory Visit: Payer: Self-pay | Admitting: Internal Medicine

## 2011-10-07 ENCOUNTER — Ambulatory Visit (INDEPENDENT_AMBULATORY_CARE_PROVIDER_SITE_OTHER): Payer: Medicare Other | Admitting: Family Medicine

## 2011-10-07 ENCOUNTER — Encounter: Payer: Self-pay | Admitting: Family Medicine

## 2011-10-07 DIAGNOSIS — J0100 Acute maxillary sinusitis, unspecified: Secondary | ICD-10-CM

## 2011-10-07 MED ORDER — AMOXICILLIN-POT CLAVULANATE 875-125 MG PO TABS: 1.0000 | ORAL_TABLET | Freq: Two times a day (BID) | ORAL | Status: AC

## 2011-10-07 NOTE — Assessment & Plan Note (Signed)
After 10 days of uri with facial pain and purulent nasal drainage Cover with augmentin  Disc symptomatic care - see instructions on AVS  Update if not starting to improve in a week or if worsening  e

## 2011-10-07 NOTE — Progress Notes (Signed)
Subjective:    Patient ID: [name], female    DOB: 13-Dec-1945, 66 y.o.   MRN: [id number]  HPI Nasal congestion for over 10 days - and now is getting worse and worse  Green nasal discharge  Bad sinus pressure under eyes on both sides- worse to bend over A little dizzy  Ears are full, can't hear Throat does not hurt  No fever   Little cough- dry   Taking mucinex  Tried some afrin - for 5 days  Zyrtec  No D in that   bp is quite high today- takes her med at night  Sees Dr Alwyn Ren in mid march  Unusual   Patient Active Problem List  Diagnoses  . HYPOTHYROIDISM  . HYPERLIPIDEMIA  . HYPERTENSION, ESSENTIAL NOS  . SUPERFICIAL THROMBOPHLEBITIS  . OSTEOPENIA  . THYROID CANCER, HX OF  . COLONIC POLYPS, HX OF  . NEPHROLITHIASIS, HX OF  . NEPHRECTOMY, HX OF  . KNEE PAIN, ACUTE  . VERTIGO  . Sinusitis, acute maxillary   No past medical history on file. Past Surgical History  Procedure Date  . Total abdominal hysterectomy w/ bilateral salpingoophorectomy 2003    [name]  . Thyroidectomy   . Tonsillectomy   . Nephrectomy     Left for 9.8cm benign complex cyst  . Dilation and curettage of uterus     x2  . Colonoscopy w/ polypectomy 7829,5621    [name]   History  Substance Use Topics  . Smoking status: Former Smoker    Quit date: 08/29/1975  . Smokeless tobacco: Not on file  . Alcohol Use: Yes     minimally   Family History  Problem Relation Age of Onset  . Transient ischemic attack Father   . Coronary artery disease Father   . Hyperlipidemia Mother   . Stroke Mother   . Thyroid disease Mother   . Urolithiasis Sister    Allergies  Allergen Reactions  . Codeine Sulfate   . Erythromycin    Current Outpatient Prescriptions on File Prior to Visit  Medication Sig Dispense Refill  . amLODipine-benazepril (LOTREL) 5-10 MG per capsule TAKE ONE CAPSULE BY MOUTH EVERY DAY  90 capsule  1  . aspirin 81 MG tablet Take 81 mg by mouth daily.        . Calcium  Carbonate-Vitamin D (CALTRATE 600+D PO) Take by mouth daily.        [name] guaiFENesin (MUCINEX) 600 MG 12 hr tablet Take 600 mg by mouth as needed.        [name] levothyroxine (SYNTHROID, LEVOTHROID) 100 MCG tablet TAKE 1 TABLET BY MOUTH EVERY DAY EXCEPT ON WEDNESDAY TAKE 1 AND 1/2  90 tablet  3  . LORazepam (ATIVAN) 1 MG tablet 1/2 by mouth 3 times daily as needed  60 tablet  1  . metoprolol succinate (TOPROL-XL) 50 MG 24 hr tablet TAKE 1 TABLET BY MOUTH EVERY DAY  90 tablet  3  . Multiple Vitamins-Minerals (CENTRUM PO) Take by mouth daily.        . Omega-3 Fatty Acids (FISH OIL PO) Take 600 mg by mouth 2 (two) times daily.        . potassium citrate (UROCIT-K 10) 10 MEQ (1080 MG) SR tablet Take 10 mEq by mouth 3 (three) times daily with meals.        . rosuvastatin (CRESTOR) 20 MG tablet Take 1 tablet (20 mg total) by mouth daily.  30 tablet  5  . diclofenac sodium (VOLTAREN) 1 %  GEL Apply topically 4 (four) times daily. For knee pain       . meclizine (ANTIVERT) 25 MG tablet Take 25 mg by mouth every 6 (six) hours as needed.        . simvastatin (ZOCOR) 20 MG tablet Take 20 mg by mouth at bedtime.        . zoster vaccine live, PF, (ZOSTAVAX) [address] UNT/0.65ML injection Inject 19,400 Units into the skin once.  1 vial  0        Review of Systems Review of Systems  Constitutional: Negative for fever, appetite change,  and unexpected weight change. pos for fatigue ENT pos for facial pain and pressure/ neg for st  Eyes: Negative for pain and visual disturbance.  Respiratory: Negative for sob or wheeze    Cardiovascular: Negative for cp or palpitations    Gastrointestinal: Negative for nausea, diarrhea and constipation.  Genitourinary: Negative for urgency and frequency.  Skin: Negative for pallor or rash   Neurological: Negative for weakness, light-headedness, numbness and headaches.  Hematological: Negative for adenopathy. Does not bruise/bleed easily.  Psychiatric/Behavioral: Negative for  dysphoric mood. The patient is not nervous/anxious.          Objective:   Physical Exam  Constitutional: She appears well-developed and well-nourished. No distress.  HENT:  Head: Normocephalic and atraumatic.  Mouth/Throat: Oropharynx is clear and moist. No oropharyngeal exudate.       Nares are injected and congested  Sinus tenderness bilat maxillary Throat- post nasal drip  TMs are dull without erythema   Eyes: Conjunctivae and EOM are normal. Pupils are equal, round, and reactive to light. Right eye exhibits no discharge. Left eye exhibits no discharge.  Neck: Normal range of motion. Neck supple. No JVD present. Carotid bruit is not present. No thyromegaly present.  Cardiovascular: Normal rate and regular rhythm.   Pulmonary/Chest: Breath sounds normal. No respiratory distress. She has no wheezes.  Lymphadenopathy:    She has no cervical adenopathy.  Skin: Skin is warm and dry. No rash noted.  Psychiatric: She has a normal mood and affect.          Assessment & Plan:

## 2011-10-07 NOTE — Patient Instructions (Signed)
Drink lots of fluids  Rest  Stop the afrin - you will be more congested for 3-7 days  Use saline nasal spray or a netti pot - and aleve if needed - just one pill twice daily if needed for pressure and pain Breathe steam Warm compresses on face help too  Update if not starting to improve in a week or if worsening   Take the augmentin I sent to your pharmacy

## 2011-10-09 NOTE — Telephone Encounter (Signed)
Patient needs to schedule a FASTING appointment with Dr.Hopper to discuss labs from 02/2011 and have any additional labs drawn

## 2011-11-03 ENCOUNTER — Encounter: Payer: Medicare Other | Admitting: Internal Medicine

## 2011-11-10 ENCOUNTER — Encounter: Payer: Self-pay | Admitting: Internal Medicine

## 2011-11-10 ENCOUNTER — Ambulatory Visit (INDEPENDENT_AMBULATORY_CARE_PROVIDER_SITE_OTHER): Payer: Medicare Other | Admitting: Internal Medicine

## 2011-11-10 VITALS — BP 130/86 | HR 84 | Temp 98.0°F | Resp 12 | Ht 65.75 in | Wt 156.4 lb

## 2011-11-10 DIAGNOSIS — Z860100 Personal history of colon polyps, unspecified: Secondary | ICD-10-CM

## 2011-11-10 DIAGNOSIS — I10 Essential (primary) hypertension: Secondary | ICD-10-CM

## 2011-11-10 DIAGNOSIS — E039 Hypothyroidism, unspecified: Secondary | ICD-10-CM

## 2011-11-10 DIAGNOSIS — M949 Disorder of cartilage, unspecified: Secondary | ICD-10-CM

## 2011-11-10 DIAGNOSIS — Z0000 Encounter for general adult medical examination without abnormal findings: Secondary | ICD-10-CM

## 2011-11-10 DIAGNOSIS — M899 Disorder of bone, unspecified: Secondary | ICD-10-CM

## 2011-11-10 DIAGNOSIS — E782 Mixed hyperlipidemia: Secondary | ICD-10-CM

## 2011-11-10 LAB — HEPATIC FUNCTION PANEL
ALT: 23 U/L (ref 0–35)
AST: 24 U/L (ref 0–37)
Alkaline Phosphatase: 85 U/L (ref 39–117)
Bilirubin, Direct: 0 mg/dL (ref 0.0–0.3)
Total Bilirubin: 0.3 mg/dL (ref 0.3–1.2)
Total Protein: 7.8 g/dL (ref 6.0–8.3)

## 2011-11-10 LAB — CBC WITH DIFFERENTIAL/PLATELET
Basophils Absolute: 0 10*3/uL (ref 0.0–0.1)
Basophils Relative: 0.2 % (ref 0.0–3.0)
Eosinophils Absolute: 0.3 10*3/uL (ref 0.0–0.7)
Hemoglobin: 14.4 g/dL (ref 12.0–15.0)
Lymphocytes Relative: 21.5 % (ref 12.0–46.0)
MCHC: 33.4 g/dL (ref 30.0–36.0)
MCV: 94.5 fl (ref 78.0–100.0)
Monocytes Absolute: 0.6 10*3/uL (ref 0.1–1.0)
Neutro Abs: 5.1 10*3/uL (ref 1.4–7.7)
RBC: 4.55 Mil/uL (ref 3.87–5.11)
RDW: 12.3 % (ref 11.5–14.6)

## 2011-11-10 LAB — LIPID PANEL: Total CHOL/HDL Ratio: 4

## 2011-11-10 LAB — BASIC METABOLIC PANEL
CO2: 28 mEq/L (ref 19–32)
Calcium: 9.3 mg/dL (ref 8.4–10.5)
Chloride: 105 mEq/L (ref 96–112)
Creatinine, Ser: 1.1 mg/dL (ref 0.4–1.2)
Glucose, Bld: 92 mg/dL (ref 70–99)
Sodium: 142 mEq/L (ref 135–145)

## 2011-11-10 LAB — TSH: TSH: 0.03 u[IU]/mL — ABNORMAL LOW (ref 0.35–5.50)

## 2011-11-10 LAB — LDL CHOLESTEROL, DIRECT: Direct LDL: 101.7 mg/dL

## 2011-11-10 MED ORDER — BENAZEPRIL HCL 10 MG PO TABS: 10.0000 mg | ORAL_TABLET | Freq: Every day | ORAL | Status: DC

## 2011-11-10 MED ORDER — AMLODIPINE BESYLATE 5 MG PO TABS: 5.0000 mg | ORAL_TABLET | Freq: Every day | ORAL | Status: DC

## 2011-11-10 MED ORDER — ZOSTER VACCINE LIVE 19400 UNT/0.65ML ~~LOC~~ SOLR: 0.6500 mL | Freq: Once | SUBCUTANEOUS | Status: DC

## 2011-11-10 NOTE — Progress Notes (Signed)
Subjective:    Patient ID: [name], female    DOB: [birth year], 66 y.o.   MRN: [id number]  HPI Medicare Wellness Visit:  The following psychosocial & medical history were reviewed as required by Medicare.   Social history: caffeine: none , alcohol:  rarely ,  tobacco use : quit 1977  & exercise : walking intermittently.   Home & personal  safety / fall risk: no issues, activities of daily living:no limitations , seatbelt use : yes , and smoke alarm employment : yes .  Power of Attorney/Living Will status : in place  Vision ( as recorded per Nurse) & Hearing  evaluation :  See exam. Orientation :oriented X 3 , memory & recall : good,  math testing: good,and mood & affect : normal . Depression / anxiety: only situational, mild Travel history :never , immunization status :Shingles needed , transfusion history:  no, and preventive health surveillance ( colonoscopies, BMD , etc as per protocol/ [facility]): colonoscopy overdue , SOC discussed, Dental care:  Every 6 mos . Chart reviewed &  Updated. Active issues reviewed & addressed.       Review of Systems HYPERTENSION: Disease Monitoring: Blood pressure range-111/57-168/75 Chest pain, palpitations- no    Dyspnea-no Medications: Compliance- yes Lightheadedness,Syncope-no    Edema- no  METABOLIC SYNDROME: Polyuria/phagia/dipsia- no       Visual problems- no TG 456 in 2005  HYPERLIPIDEMIA: Disease Monitoring: See symptoms for Hypertension Medications: Compliance- yes  Abd pain, bowel changes- no   Muscle aches- no          Objective:   Physical Exam Gen.: Healthy and well-nourished in appearance. Alert, appropriate and cooperative throughout exam. Head: Normocephalic without obvious abnormalities  Eyes: No corneal or conjunctival inflammation noted. Pupils equal round reactive to light and accommodation. Fundal exam is benign without hemorrhages, exudate, papilledema. Extraocular motion intact. Vision grossly normal. Ears:  External  ear exam reveals no significant lesions or deformities. Canals clear .TMs normal. Hearing is grossly normal bilaterally. Nose: External nasal exam reveals no deformity or inflammation. Nasal mucosa are pink and moist. No lesions or exudates noted.   Mouth: Oral mucosa and oropharynx reveal no lesions or exudates. Teeth in good repair. Neck: No deformities, masses, or tenderness noted. Range of motion normal. Thyroid absent Lungs: Normal respiratory effort; chest expands symmetrically. Lungs are clear to auscultation without rales, wheezes, or increased work of breathing. Heart: Normal rate and rhythm. Normal S1 and S2. No gallop, click, or rub. Grade 1/2 over 6 systolic murmur . Abdomen: Bowel sounds normal; abdomen soft and nontender. No masses, organomegaly or hernias noted. Genitalia: Dr Chevis Pretty                                      Musculoskeletal/extremities: No deformity or scoliosis noted of  the thoracic or lumbar spine. No clubbing, cyanosis, edema, or deformity noted. Range of motion  normal .Tone & strength  normal.Joints normal. Nail health  Good.Minor l knee crepitus Vascular: Carotid, radial artery, dorsalis pedis and  posterior tibial pulses are full and equal. No bruits present. Neurologic: Alert and oriented x3. Deep tendon reflexes symmetrical and normal.          Skin: Intact without suspicious lesions or rashes. Lymph: No cervical, axillary lymphadenopathy present. Psych: Mood and affect are normal. Normally interactive  Assessment & Plan:  #1 Medicare Wellness Exam; criteria met ; data entered #2 Problem List reviewed ; Assessment/ Recommendations made Plan: see Orders

## 2011-11-10 NOTE — Patient Instructions (Signed)
Preventive Health Care: Exercise  30-45  minutes a day, 3-4 days a week. Walking is especially valuable in preventing Osteoporosis. Eat a low-fat diet with lots of fruits and vegetables, up to 7-9 servings per day. Consume less than 30 grams of sugar per day from foods & drinks with High Fructose Corn Syrup as # 1,2,3 or #4 on label.  Blood Pressure Goal  Ideally is an AVERAGE < 135/85. This AVERAGE should be calculated from @ least 5-7 BP readings taken @ different times of day on different days of week. You should not respond to isolated BP readings , but rather the AVERAGE for that week Please bring your  blood pressure cuff to office visits to verify that it is reliable.It  can also be checked against the blood pressure device at the pharmacy. Finger or wrist cuffs are not dependable; an arm cuff is  reliable

## 2011-11-11 LAB — VITAMIN D 25 HYDROXY (VIT D DEFICIENCY, FRACTURES): Vit D, 25-Hydroxy: 39 ng/mL (ref 30–89)

## 2011-11-13 ENCOUNTER — Encounter: Payer: Self-pay | Admitting: Gastroenterology

## 2011-11-14 ENCOUNTER — Other Ambulatory Visit: Payer: Self-pay | Admitting: Internal Medicine

## 2011-11-27 ENCOUNTER — Other Ambulatory Visit: Payer: Self-pay | Admitting: Internal Medicine

## 2011-11-27 MED ORDER — LORAZEPAM 1 MG PO TABS: ORAL_TABLET | ORAL | Status: DC

## 2011-11-27 NOTE — Telephone Encounter (Signed)
OK #60 

## 2011-11-27 NOTE — Telephone Encounter (Signed)
Refill for Lorazepam 1mg  tablet Take 1/2 tablet 3-times a day as needed  Last written 1.8.13 Last qty 60

## 2011-11-27 NOTE — Telephone Encounter (Signed)
OK to refill

## 2011-11-27 NOTE — Telephone Encounter (Signed)
Refill done.  

## 2011-12-21 ENCOUNTER — Ambulatory Visit (AMBULATORY_SURGERY_CENTER): Payer: Medicare Other | Admitting: *Deleted

## 2011-12-21 ENCOUNTER — Encounter: Payer: Self-pay | Admitting: Gastroenterology

## 2011-12-21 VITALS — Ht 66.5 in | Wt 155.0 lb

## 2011-12-21 DIAGNOSIS — Z1211 Encounter for screening for malignant neoplasm of colon: Secondary | ICD-10-CM

## 2011-12-21 MED ORDER — PEG-KCL-NACL-NASULF-NA ASC-C 100 G PO SOLR: ORAL | Status: DC

## 2011-12-26 ENCOUNTER — Encounter: Payer: Medicare Other | Admitting: Gastroenterology

## 2012-01-04 ENCOUNTER — Ambulatory Visit (AMBULATORY_SURGERY_CENTER): Payer: Medicare Other | Admitting: Gastroenterology

## 2012-01-04 ENCOUNTER — Encounter: Payer: Self-pay | Admitting: Gastroenterology

## 2012-01-04 VITALS — BP 156/74 | HR 73 | Temp 98.7°F | Resp 18 | Ht 66.0 in | Wt 155.0 lb

## 2012-01-04 DIAGNOSIS — Z860100 Personal history of colon polyps, unspecified: Secondary | ICD-10-CM

## 2012-01-04 DIAGNOSIS — D126 Benign neoplasm of colon, unspecified: Secondary | ICD-10-CM

## 2012-01-04 DIAGNOSIS — K635 Polyp of colon: Secondary | ICD-10-CM

## 2012-01-04 DIAGNOSIS — Z1211 Encounter for screening for malignant neoplasm of colon: Secondary | ICD-10-CM

## 2012-01-04 MED ORDER — SODIUM CHLORIDE 0.9 % IV SOLN: 500.0000 mL | INTRAVENOUS | Status: DC

## 2012-01-04 NOTE — Patient Instructions (Signed)

## 2012-01-04 NOTE — Op Note (Signed)
 [facility] [address] [place], [place]  [address]  COLONOSCOPY PROCEDURE REPORT PATIENT:  [name], [name]  MR#:  [id number] BIRTHDATE:  [birth year], 65 yrs. old  GENDER:  female ENDOSCOPIST:  Judie Petit T. Russella Dar, MD, [facility]  PROCEDURE DATE:  01/04/2012 PROCEDURE:  Colonoscopy with biopsy and snare polypectomy ASA CLASS:  Class II INDICATIONS:  1) surveillance and high-risk screening  2) history of pre-cancerous (adenomatous) colon polyps: 2003 MEDICATIONS:   MAC sedation, administered by CRNA, propofol (Diprivan) 220 mg IV DESCRIPTION OF PROCEDURE:   After the risks benefits and alternatives of the procedure were thoroughly explained, informed consent was obtained.  Digital rectal exam was performed and revealed no abnormalities.   The LB [id number] endoscope was introduced through the anus and advanced to the cecum, which was identified by both the appendix and ileocecal valve, without limitations.  The quality of the prep was excellent, using MoviPrep.  The instrument was then slowly withdrawn as the colon was fully examined. <<PROCEDUREIMAGES>> FINDINGS:  Two polyps were found in the ascending colon. They were 4 mm in size. The polyps were removed using cold biopsy forceps. A pedunculated polyp was found in the sigmoid colon. It was 8 mm in size. Polyp was snared, then cauterized with monopolar cautery. Retrieval was successful.  Mild diverticulosis was found in the sigmoid colon.  Otherwise normal colonoscopy without other polyps, masses, vascular ectasias, or inflammatory changes. Retroflexed views in the rectum revealed no abnormalities. The time to cecum = 2.25  minutes. The scope was then withdrawn (time =  11.33  min) from the patient and the procedure completed.  COMPLICATIONS:  None  ENDOSCOPIC IMPRESSION: 1) 4 mm Two polyps in the ascending colon 2) 8 mm pedunculated polyp in the sigmoid colon 3) Mild diverticulosis in the sigmoid  colon  RECOMMENDATIONS: 1) Hold aspirin, aspirin products, and anti-inflammatory medication for 2 weeks. 2) Await pathology results 3) High fiber diet with liberal fluid intake. 4) Repeat Colonoscopy in 5 years.  Venita Lick. Russella Dar, MD, [facility]  n. eSIGNED:   Venita Lick. Gabreil [place] at 01/04/2012 10:40 AM  Tracey Harries, [id number]

## 2012-01-04 NOTE — Progress Notes (Signed)
Patient did not experience any of the following events: a burn prior to discharge; a fall within the facility; wrong site/side/patient/procedure/implant event; or a hospital transfer or hospital admission upon discharge from the facility. (G8907) Patient did not have preoperative order for IV antibiotic SSI prophylaxis. (G8918)  

## 2012-01-05 ENCOUNTER — Telehealth: Payer: Self-pay | Admitting: *Deleted

## 2012-01-05 NOTE — Telephone Encounter (Signed)
  Follow up Call-  Call back number 01/04/2012  Post procedure Call Back phone  # [phone]  Permission to leave phone message Yes     Patient questions:  Do you have a fever, pain , or abdominal swelling? no Pain Score  0 *  Have you tolerated food without any problems? yes  Have you been able to return to your normal activities? yes  Do you have any questions about your discharge instructions: Diet   no Medications  no Follow up visit  no  Do you have questions or concerns about your Care? no  Actions: * If pain score is 4 or above: No action needed, pain <4.

## 2012-01-09 ENCOUNTER — Encounter: Payer: Self-pay | Admitting: Gastroenterology

## 2012-02-08 ENCOUNTER — Telehealth: Payer: Self-pay | Admitting: Internal Medicine

## 2012-02-08 MED ORDER — LORAZEPAM 1 MG PO TABS: ORAL_TABLET | ORAL | Status: DC

## 2012-02-08 NOTE — Telephone Encounter (Signed)
Rx last printed on 11/27/11 #60. Pt last seen 11/10/11.  Please advise if ok to refill and if so how many refills may we provide?

## 2012-02-08 NOTE — Telephone Encounter (Signed)
Rx sent 

## 2012-02-08 NOTE — Telephone Encounter (Signed)
OK X 1 ;prn only , not routinely 

## 2012-02-08 NOTE — Telephone Encounter (Signed)
Refill: Lorazepam 1mg  tablet. Take 1/2 tablet 3 times a day as needed.

## 2012-03-06 ENCOUNTER — Other Ambulatory Visit: Payer: Self-pay | Admitting: Internal Medicine

## 2012-03-06 NOTE — Telephone Encounter (Signed)
Refill done.  

## 2012-03-08 ENCOUNTER — Encounter: Payer: Self-pay | Admitting: Gynecology

## 2012-03-08 ENCOUNTER — Ambulatory Visit (INDEPENDENT_AMBULATORY_CARE_PROVIDER_SITE_OTHER): Payer: Medicare Other | Admitting: Gynecology

## 2012-03-08 VITALS — BP 142/88 | Ht 65.5 in | Wt 157.0 lb

## 2012-03-08 DIAGNOSIS — M899 Disorder of bone, unspecified: Secondary | ICD-10-CM

## 2012-03-08 DIAGNOSIS — N952 Postmenopausal atrophic vaginitis: Secondary | ICD-10-CM

## 2012-03-08 DIAGNOSIS — M8580 Other specified disorders of bone density and structure, unspecified site: Secondary | ICD-10-CM

## 2012-03-08 DIAGNOSIS — Z780 Asymptomatic menopausal state: Secondary | ICD-10-CM

## 2012-03-08 NOTE — Progress Notes (Signed)
[name] [birth year] [id number]   History:    66 y.o.  for GYN exam. Patient was being followed by Dr. Lorenso Courier. Patient's last gynecological examination was in 2012. She states her Pap smears always been normal. Patient with prior history of supracervical hysterectomy with bilateral salpingo-oophorectomy. She freely does her self breast examination. Her last mammogram was in 2012. Dr. Alwyn Ren has been her primary physician who is been monitoring her for her hypertension and hypothyroidism as well as her osteopenia. No labs will be drawn today. Patient has history in the past of a left nephrectomy as a result of a large 9.8 cm benign renal cysts. She's currently being followed by the nephrologist Dr. Laverle Patter once a year. Patient's last colonoscopy was in 2013 and had polyps removed the same is in 2003 in 2006 respectively.  Past medical history,surgical history, family history and social history were all reviewed and documented in the EPIC chart.  Gynecologic History No LMP recorded. Patient has had a hysterectomy. Contraception: none Last Pap: 2012. Results were: normal Last mammogram: 2012. Results were: normal  Obstetric History OB History    Grav Para Term Preterm Abortions TAB SAB Ect Mult Living   2 2 2       2      # Outc Date GA Lbr Len/2nd Wgt Sex Del Anes PTL Lv   1 TRM     F SVD  No Yes   2 TRM     M SVD  No Yes       ROS: A ROS was performed and pertinent positives and negatives are included in the history.  GENERAL: No fevers or chills. HEENT: No change in vision, no earache, sore throat or sinus congestion. NECK: No pain or stiffness. CARDIOVASCULAR: No chest pain or pressure. No palpitations. PULMONARY: No shortness of breath, cough or wheeze. GASTROINTESTINAL: No abdominal pain, nausea, vomiting or diarrhea, melena or bright red blood per rectum. GENITOURINARY: No urinary frequency, urgency, hesitancy or dysuria. MUSCULOSKELETAL: No joint or muscle pain, no back pain, no recent  trauma. DERMATOLOGIC: No rash, no itching, no lesions. ENDOCRINE: No polyuria, polydipsia, no heat or cold intolerance. No recent change in weight. HEMATOLOGICAL: No anemia or easy bruising or bleeding. NEUROLOGIC: No headache, seizures, numbness, tingling or weakness. PSYCHIATRIC: No depression, no loss of interest in normal activity or change in sleep pattern.     Exam: chaperone present  BP 142/88  Ht 5' 5.5" (1.664 m)  Wt 157 lb (71.215 kg)  BMI 25.73 kg/m2  Body mass index is 25.73 kg/(m^2).  General appearance : Well developed well nourished female. No acute distress HEENT: Neck supple, trachea midline, no carotid bruits, no thyroidmegaly Lungs: Clear to auscultation, no rhonchi or wheezes, or rib retractions  Heart: Regular rate and rhythm, no murmurs or gallops Breast:Examined in sitting and supine position were symmetrical in appearance, no palpable masses or tenderness,  no skin retraction, no nipple inversion, no nipple discharge, no skin discoloration, no axillary or supraclavicular lymphadenopathy Abdomen: no palpable masses or tenderness, no rebound or guarding Extremities: no edema or skin discoloration or tenderness  Pelvic:  Bartholin, Urethra, Skene Glands: Within normal limits             Vagina: No gross lesions or discharge, atrophic changes  Cervix: No gross lesions or discharge  Uterus absent  Adnexa  Without masses or tenderness  Anus and perineum  normal   Rectovaginal  normal sphincter tone without palpated masses or tenderness  Hemoccult not done colonoscopy a few months ago     Assessment/Plan:  66 y.o. female with normal gynecological exam. A requisition was provided for to schedule her mammogram. Her primary physician will be scheduling this year for her bone density study. She's also been followed for vitamin D deficiency and he will be doing all her lab work in the next few weeks. She was encouraged to do her self breast examination and to  engage in weightbearing exercises 3 or 4 times a week along with calcium and vitamin D for osteoporosis prevention. Vaginal atrophy was noted but patient asymptomatic and she's going to watch it for now.   Ok Edwards MD, 11:46 AM 03/08/2012

## 2012-03-08 NOTE — Patient Instructions (Addendum)

## 2012-03-13 ENCOUNTER — Other Ambulatory Visit (INDEPENDENT_AMBULATORY_CARE_PROVIDER_SITE_OTHER): Payer: Medicare Other

## 2012-03-13 DIAGNOSIS — E785 Hyperlipidemia, unspecified: Secondary | ICD-10-CM

## 2012-03-13 DIAGNOSIS — I10 Essential (primary) hypertension: Secondary | ICD-10-CM

## 2012-03-13 LAB — LIPID PANEL
Cholesterol: 222 mg/dL — ABNORMAL HIGH (ref 0–200)
Total CHOL/HDL Ratio: 5
VLDL: 63.4 mg/dL — ABNORMAL HIGH (ref 0.0–40.0)

## 2012-03-17 LAB — VITAMIN D 1,25 DIHYDROXY
Vitamin D 1, 25 (OH)2 Total: 48 pg/mL (ref 18–72)
Vitamin D3 1, 25 (OH)2: 48 pg/mL

## 2012-03-27 ENCOUNTER — Other Ambulatory Visit: Payer: Self-pay | Admitting: Internal Medicine

## 2012-03-27 NOTE — Telephone Encounter (Signed)
LORAZEPAM 1 MG TABLET LAST REFILL: 02/08/12 TAKE 1/2 TABLET BY MOUTH 3 TIMES A DAY AS NEEDED ONLY (DO NOT TAKE...)

## 2012-03-28 MED ORDER — LORAZEPAM 1 MG PO TABS: ORAL_TABLET | ORAL | Status: DC

## 2012-03-28 NOTE — Telephone Encounter (Signed)
RX called in .

## 2012-04-12 ENCOUNTER — Ambulatory Visit (INDEPENDENT_AMBULATORY_CARE_PROVIDER_SITE_OTHER): Payer: Medicare Other | Admitting: Internal Medicine

## 2012-04-12 ENCOUNTER — Encounter: Payer: Self-pay | Admitting: Internal Medicine

## 2012-04-12 VITALS — BP 130/82 | HR 74 | Wt 156.8 lb

## 2012-04-12 DIAGNOSIS — E039 Hypothyroidism, unspecified: Secondary | ICD-10-CM

## 2012-04-12 DIAGNOSIS — E782 Mixed hyperlipidemia: Secondary | ICD-10-CM

## 2012-04-12 DIAGNOSIS — M949 Disorder of cartilage, unspecified: Secondary | ICD-10-CM

## 2012-04-12 DIAGNOSIS — I10 Essential (primary) hypertension: Secondary | ICD-10-CM

## 2012-04-12 MED ORDER — ROSUVASTATIN CALCIUM 20 MG PO TABS: 20.0000 mg | ORAL_TABLET | Freq: Every day | ORAL | Status: DC

## 2012-04-12 NOTE — Assessment & Plan Note (Signed)
Blood pressure is well controlled; no change in present medicines

## 2012-04-12 NOTE — Assessment & Plan Note (Signed)
Bone density will be scheduled. If there is significant progression of osteopenia; the thyroid dose will be decreased

## 2012-04-12 NOTE — Progress Notes (Signed)
  Subjective:    Patient ID: [name], female    DOB: November 15, 1945, 66 y.o.   MRN: [id number]  HPI She returns to review the lab reports done to assess thyroid function and lipids. Triglycerides are essentially unchanged at 317. LDL was minimally elevated at 114. Her triglycerides have been as high as the mid 400s and the LDL as high as 252. Based on the advanced cholesterol testing her LDL goal will be less than 100, ideally less than 70. The lowest triglycerides on record are 179.  The TSH has risen from 0.03 to a value of 0.4 on 100 mcg daily. She's had a total thyroidectomy for benign nodule. She also has osteopenia; excess thyroid replacement should be avoided. Suppressive TSH levels are not needed as there was no thyroid malignancy. Her vitamin D level is adequate at 48.    Review of Systems HYPERTENSION: Disease Monitoring: Blood pressure -130/80+ on average  Chest pain, palpitations- no      Dyspnea- no Medications: Compliance- yes  Lightheadedness,Syncope- no    Edema- no  HYPERLIPIDEMIA: Disease Monitoring: See symptoms for Hypertension Medications: Compliance- yes  Abd pain, bowel changes- no Muscle aches- no  She is physically active caring for her grandchildren and her mother. She denies excessive fatigue, vision change, hair/nail/skin change, constipation or diarrhea, temperature intolerance, or significant weight change.         Objective:   Physical Exam Gen.: Healthy and well-nourished in appearance. Alert, appropriate and cooperative throughout exam.  Eyes: No corneal or conjunctival inflammation noted. No lid lag or proptosis. Extraocular motion intact.  Neck: No deformities, masses, or tenderness noted. Range of motion good. Thyroid absnt. Lungs: Normal respiratory effort; chest expands symmetrically. Lungs are clear to auscultation without rales, wheezes, or increased work of breathing. Heart: Normal rate and rhythm. Normal S1 and S2. No gallop, click, or  rub. S4 w/o murmur. Abdomen: Bowel sounds normal; abdomen soft and nontender. No masses, organomegaly or hernias noted.                                                               Musculoskeletal/extremities: Slight lordosis  noted of  the thoracic  spine. No clubbing, cyanosis, edema, or significant deformity noted. Range of motion  normal .Tone & strength  Normal.Joints:PIP fusiform changes. Nail health  good. Vascular: Carotid, radial artery, dorsalis pedis and  posterior tibial pulses are full and equal. No bruits present. Neurologic: Alert and oriented x3. Deep tendon reflexes symmetrical and normal.          Skin: Intact without suspicious lesions or rashes. Lymph: No cervical, axillary lymphadenopathy present. Psych: Mood and affect are normal. Normally interactive                                                                                         Assessment & Plan:

## 2012-04-12 NOTE — Assessment & Plan Note (Signed)
There has been dramatic improvement in her lipids; but there appears to be significant room for improvement in nutrition. Interventions discussed. Her LDL risk has dropped over 161%. No change in Crestor

## 2012-04-12 NOTE — Assessment & Plan Note (Signed)
As per the reference to the osteopenia; no change will be made in the thyroid dose unless there is progression of osteopenia

## 2012-04-12 NOTE — Patient Instructions (Addendum)
Please  schedule fasting Labs in 3-4 mos : Lipids.PLEASE BRING THESE INSTRUCTIONS TO FOLLOW UP  LAB APPOINTMENT.This will guarantee correct labs are drawn, eliminating need for repeat blood sampling ( needle sticks ! ). Diagnoses /Codes: 272.4

## 2012-05-10 ENCOUNTER — Other Ambulatory Visit: Payer: Medicare Other

## 2012-05-20 ENCOUNTER — Telehealth: Payer: Self-pay | Admitting: Internal Medicine

## 2012-05-20 MED ORDER — LORAZEPAM 1 MG PO TABS: ORAL_TABLET | ORAL | Status: DC

## 2012-05-20 NOTE — Telephone Encounter (Signed)
RX called in .

## 2012-05-20 NOTE — Telephone Encounter (Signed)
Refill: Lorazepam 1mg  tablet. Take 1/2 tablet by mouoth 3 times a day as needed only. Do not take routinely. Last fill 8.1.13

## 2012-06-12 ENCOUNTER — Ambulatory Visit (INDEPENDENT_AMBULATORY_CARE_PROVIDER_SITE_OTHER): Payer: Medicare Other

## 2012-06-12 ENCOUNTER — Other Ambulatory Visit: Payer: Self-pay | Admitting: Gynecology

## 2012-06-12 DIAGNOSIS — Z1231 Encounter for screening mammogram for malignant neoplasm of breast: Secondary | ICD-10-CM

## 2012-06-12 DIAGNOSIS — Z23 Encounter for immunization: Secondary | ICD-10-CM

## 2012-07-06 ENCOUNTER — Other Ambulatory Visit: Payer: Self-pay | Admitting: Internal Medicine

## 2012-07-08 NOTE — Telephone Encounter (Signed)
Filled 04/12/2012 for a year supply. I called the pharmacy and spoke with Liborio Nixon, rx is on file from Aug, old rx number was entered. This rx was sent in error

## 2012-07-10 ENCOUNTER — Ambulatory Visit
Admission: RE | Admit: 2012-07-10 | Discharge: 2012-07-10 | Disposition: A | Discharge status: Home / Self Care | Payer: Medicare Other | Source: Ambulatory Visit | Attending: Gynecology | Admitting: Gynecology

## 2012-07-10 ENCOUNTER — Other Ambulatory Visit: Payer: Self-pay | Admitting: Internal Medicine

## 2012-07-10 DIAGNOSIS — Z1231 Encounter for screening mammogram for malignant neoplasm of breast: Secondary | ICD-10-CM

## 2012-07-17 ENCOUNTER — Other Ambulatory Visit (INDEPENDENT_AMBULATORY_CARE_PROVIDER_SITE_OTHER): Payer: Medicare Other

## 2012-07-17 DIAGNOSIS — E785 Hyperlipidemia, unspecified: Secondary | ICD-10-CM

## 2012-07-17 LAB — LDL CHOLESTEROL, DIRECT: Direct LDL: 106.1 mg/dL

## 2012-07-17 LAB — LIPID PANEL
Cholesterol: 210 mg/dL — ABNORMAL HIGH (ref 0–200)
Triglycerides: 364 mg/dL — ABNORMAL HIGH (ref 0.0–149.0)

## 2012-08-07 ENCOUNTER — Ambulatory Visit (INDEPENDENT_AMBULATORY_CARE_PROVIDER_SITE_OTHER): Payer: Medicare Other | Admitting: Internal Medicine

## 2012-08-07 ENCOUNTER — Encounter: Payer: Self-pay | Admitting: Internal Medicine

## 2012-08-07 VITALS — BP 134/80 | HR 70 | Temp 98.0°F | Wt 159.6 lb

## 2012-08-07 DIAGNOSIS — J209 Acute bronchitis, unspecified: Secondary | ICD-10-CM

## 2012-08-07 DIAGNOSIS — J069 Acute upper respiratory infection, unspecified: Secondary | ICD-10-CM

## 2012-08-07 DIAGNOSIS — E785 Hyperlipidemia, unspecified: Secondary | ICD-10-CM

## 2012-08-07 MED ORDER — FENOFIBRATE MICRONIZED 130 MG PO CAPS: 130.0000 mg | ORAL_CAPSULE | Freq: Every day | ORAL | Status: DC

## 2012-08-07 MED ORDER — AMOXICILLIN 500 MG PO CAPS: 500.0000 mg | ORAL_CAPSULE | Freq: Three times a day (TID) | ORAL | Status: DC

## 2012-08-07 MED ORDER — FLUTICASONE PROPIONATE 50 MCG/ACT NA SUSP: 1.0000 | Freq: Two times a day (BID) | NASAL | Status: DC | PRN

## 2012-08-07 NOTE — Progress Notes (Signed)
  Subjective:    Patient ID: [name], female    DOB: 08/03/1946, 66 y.o.   MRN: [id number]  HPI She is here to followup her lipids. Her LDL is excellent at 106. Unfortunately her triglycerides have risen to 364. Over the last 8 years the triglycerides have ranged from a low of 179-456. She states that she is on a heart healthy diet and tries to avoid the sugar additive high fructose corn syrup in foods and drinks. She is not on an exercise program.  She also has symptoms of a respiratory infection. She noted head congestion as of 05/05/12. She's had pressure discomfort mainly in the right ear. She has sore throat & a cough with green sputum. She also has right facial pain. She's had a temperature as high as 99. She has been taking Mucinex   Review of Systems She denies frontal headache or otic discharge. She is not having chills or sweats. She has no associated shortness of breath or wheezing. She will take lorazepam one half pill at bedtime as needed. Typically a 30 day prescription lasts her 2 months. As per protocol a urine screen was checked.     Objective:   Physical Exam She appears healthy and well-nourished;she is in no acute distress  No  lymphadenopathy about the head, neck, or axilla noted.   Eyes: No conjunctival inflammation or lid edema is present. There is no scleral icterus.  Ears:  External ear exam shows no significant lesions or deformities.  Otoscopic examination reveals clear canals, tympanic membranes are intact bilaterally without bulging, retraction, inflammation or discharge.  Nose:  External nasal examination shows no deformity or inflammation. Nasal mucosa are pink and moist without lesions or exudates. No septal dislocation or deviation.No obstruction to airflow.   Oral exam: Dental hygiene is good; lips and gums are healthy appearing.There is no oropharyngeal erythema or exudate noted. Osteoma of hard palate  Neck:  No deformities, masses, or tenderness  noted.      Heart:  Normal rate and regular rhythm. S1 and S2 normal without gallop,  click, rub or other extra sounds. Grade 1/2 over 6 systolic murmur   Lungs:Chest clear to auscultation; no wheezes, rhonchi,rales ,or rubs present.No increased work of breathing.    Extremities:  No cyanosis, edema, or clubbing  noted    Skin: Warm & dry w/o jaundice     No carotid bruits are present.     There is no evidence of aortic aneurysm or renal artery bruits     Pedal pulses are intact   No ischemic skin changes are present         Assessment & Plan:  #1 dyslipidemia, mainly related to excessively high triglycerides. Pathophysiology of triglyceride elevation discussed  #2 bronchitis with upper respiratory tract symptoms.  Plan: See orders and recommendations

## 2012-08-07 NOTE — Patient Instructions (Addendum)
Plain Mucinex for thick secretions ;force NON dairy fluids . Use a Neti pot daily as needed for sinus congestion; going from open side to congested side . Nasal cleansing in the shower as discussed. Make sure that all residual soap is removed to prevent irritation. Fluticasone 1 spray in each nostril twice a day as needed. Use the "crossover" technique as discussed. Plain Allegra 160 daily as needed for itchy eyes & sneezing.   The most common cause of elevated triglycerides is the ingestion of sugar from high fructose corn syrup sources added to processed foods & drinks.  Eat a low-fat diet with lots of fruits and vegetables, up to 7-9 servings per day. Consume less than 30 (preferably ZERO) grams of sugar per day from foods & drinks with High Fructose Corn Syrup (HFCS) sugar as #1,2,3 or # 4 on label.Whole [employer], [employer] & [employer] do not carry products with HFCS.  Please  schedule fasting Labs in 10 weeks : Lipids, hepatic panel,  TSH.  PLEASE BRING THESE INSTRUCTIONS TO FOLLOW UP  LAB APPOINTMENT.This will guarantee correct labs are drawn, eliminating need for repeat blood sampling ( needle sticks ! ). Diagnoses /Codes: 277.7,995.20

## 2012-08-27 ENCOUNTER — Other Ambulatory Visit: Payer: Self-pay | Admitting: Internal Medicine

## 2012-08-27 ENCOUNTER — Telehealth: Payer: Self-pay | Admitting: *Deleted

## 2012-08-27 NOTE — Telephone Encounter (Signed)
Please contact  Your Pharmacist as to  insurance plan's preferred option instead of Antatra .This option would be equally efficacious but the cost may vary significantly on each  plan. Only your insurance company would be able to determine the preferred brand or generic. This changes @ least every [place].

## 2012-08-27 NOTE — Telephone Encounter (Signed)
Left message on voicemail for patient with Dr.Hopper's response. Patient to return call once information obtained from insurance company

## 2012-08-27 NOTE — Telephone Encounter (Signed)
Hopp please advise on patient's request to change medication due to cost

## 2012-08-29 ENCOUNTER — Other Ambulatory Visit: Payer: Self-pay | Admitting: Internal Medicine

## 2012-08-30 ENCOUNTER — Other Ambulatory Visit: Payer: Self-pay | Admitting: Internal Medicine

## 2012-08-30 MED ORDER — LORAZEPAM 1 MG PO TABS: ORAL_TABLET | ORAL | Status: DC

## 2012-08-30 NOTE — Telephone Encounter (Signed)
Patient called [facility] and fenofibrate (generic form) was given. No rx needed, pharmacy will substitute automatically

## 2012-08-30 NOTE — Telephone Encounter (Signed)
OK X1.The medication is " as needed" and should not be taken on a regular basis. It should be taken :" one half to one every 8 hours as needed only". Regular use can increase risk of addiction but more importantly affect level of alertness and balance with increased risk of falling.

## 2012-08-30 NOTE — Telephone Encounter (Signed)
Last OV 08/07/12, last filled 07/10/29 #60. Please verify instructions correct

## 2012-08-30 NOTE — Telephone Encounter (Signed)
Rx manually faxed, first rx printed was shredded had double instructions that would have confused the pharmacist.

## 2012-09-20 ENCOUNTER — Other Ambulatory Visit: Payer: Self-pay | Admitting: Internal Medicine

## 2012-09-24 ENCOUNTER — Encounter: Payer: Self-pay | Admitting: Lab

## 2012-09-25 ENCOUNTER — Ambulatory Visit (INDEPENDENT_AMBULATORY_CARE_PROVIDER_SITE_OTHER): Payer: Medicare Other | Admitting: Internal Medicine

## 2012-09-25 ENCOUNTER — Encounter: Payer: Self-pay | Admitting: Internal Medicine

## 2012-09-25 VITALS — BP 140/82 | HR 94 | Temp 98.3°F | Wt 159.0 lb

## 2012-09-25 DIAGNOSIS — M79609 Pain in unspecified limb: Secondary | ICD-10-CM

## 2012-09-25 DIAGNOSIS — G5700 Lesion of sciatic nerve, unspecified lower limb: Secondary | ICD-10-CM

## 2012-09-25 DIAGNOSIS — M79606 Pain in leg, unspecified: Secondary | ICD-10-CM

## 2012-09-25 MED ORDER — CELECOXIB 200 MG PO CAPS: 200.0000 mg | ORAL_CAPSULE | Freq: Two times a day (BID) | ORAL | Status: DC

## 2012-09-25 NOTE — Addendum Note (Signed)
Addended byMarga Melnick F on: 09/25/2012 04:07 PM   Modules accepted: Orders

## 2012-09-25 NOTE — Patient Instructions (Addendum)
Use an anti-inflammatory cream such as Aspercreme or Zostrix cream twice a day to the left thigh as needed. In lieu of this warm moist compresses or  hot water bottle can be used. Do not apply ice.  Review and correct the record as indicated. Please share record with all medical staff seen.

## 2012-09-25 NOTE — Progress Notes (Signed)
  Subjective:    Patient ID: [name], female    DOB: [birth year], 67 y.o.   MRN: [id number]  HPI Extremity pain Location:R hip Onset:09/11/12 Trigger/injury:twisted hip  on stairs& then drove 2.5 hrs hrs next day Pain quality:dull- sharp Pain severity: up to 9 Duration: as long as walking Radiation: to R knee Exacerbating factors: walking & RLDP  Treatment/response:NSAIDS with response    Review of Systems Constitutional: no fever, chills, sweats, change in weight  Musculoskeletal:no  muscle cramps or pain; no  joint stiffness, redness, or swelling Skin:no rash, color/temp change Neuro: no weakness; incontinence (stool/urine); numbness and tingling Heme:no lymphadenopathy; abnormal bruising or bleeding         Objective:   Physical Exam Gen.:  well-nourished in appearance. Alert, appropriate and cooperative throughout exam.    Musculoskeletal/extremities: Accentuated curvature of upper thoracic  spine. . No clubbing, cyanosis, edema, or significant extremity  deformity noted. Range of motion normal except decreased @ neck.Tone & strength  normal.Joints reveal minor   DJD  changes. Nail health good. Able to lie down & sit up w/o help. Negative SLR bilaterally. There is crepitus of the knees, right greater than left. There is no associated effusion. There is no pain with range of motion of the hip. She does have pain in the upper lateral  thigh with walking Neurologic: Alert and oriented x3. Deep tendon reflexes symmetrical and normal. Gait including heel & toe walking normal.        Skin: Intact without suspicious lesions or rashes. Psych: Mood and affect are normal. Normally interactive                                                                                         Assessment & Plan:  Right lower extremity pain; exam does not suggest bursitis. A  pyriformis syndrome may be present.  Plan: Because of duration of symptoms and long-term risk with nonsteroidals;  orthopedic evaluation indicated.

## 2012-09-27 ENCOUNTER — Telehealth: Payer: Self-pay | Admitting: Internal Medicine

## 2012-09-27 DIAGNOSIS — G5700 Lesion of sciatic nerve, unspecified lower limb: Secondary | ICD-10-CM

## 2012-09-27 DIAGNOSIS — M79606 Pain in leg, unspecified: Secondary | ICD-10-CM

## 2012-09-27 MED ORDER — CELECOXIB 200 MG PO CAPS: 200.0000 mg | ORAL_CAPSULE | Freq: Two times a day (BID) | ORAL | Status: DC

## 2012-09-27 NOTE — Telephone Encounter (Signed)
Patient would like 1 more sample of Celebrex. She has appt with specialist on Friday 10/04/12.

## 2012-09-27 NOTE — Telephone Encounter (Signed)
Rx sent 

## 2012-09-27 NOTE — Telephone Encounter (Signed)
OK # 4 packs of 3 pills in available

## 2012-10-04 ENCOUNTER — Telehealth: Payer: Self-pay | Admitting: Internal Medicine

## 2012-10-04 NOTE — Telephone Encounter (Signed)
Hopp please advise if samples to be given or if rx to be sent in to a local pharmacy

## 2012-10-04 NOTE — Telephone Encounter (Signed)
Patient states Dr. Alwyn Ren gave her samples of Celebrex and that it has helped tremendously. Dr. Shelle Iron said she could continue to take it for a while, but that it was up to Dr. Alwyn Ren. Patient would like to continue taking this and is almost out of samples.

## 2012-10-04 NOTE — Telephone Encounter (Signed)
1-2 per day as needed up to 7 days max can be provided as samples if  available. This medication should not be taken routinely or long-term

## 2012-10-07 NOTE — Telephone Encounter (Signed)
Left message to call office

## 2012-10-08 NOTE — Telephone Encounter (Signed)
Left Pt detail message.

## 2012-10-16 ENCOUNTER — Other Ambulatory Visit (INDEPENDENT_AMBULATORY_CARE_PROVIDER_SITE_OTHER): Payer: Medicare Other

## 2012-10-16 DIAGNOSIS — T887XXA Unspecified adverse effect of drug or medicament, initial encounter: Secondary | ICD-10-CM

## 2012-10-16 DIAGNOSIS — E039 Hypothyroidism, unspecified: Secondary | ICD-10-CM

## 2012-10-16 DIAGNOSIS — E8881 Metabolic syndrome: Secondary | ICD-10-CM

## 2012-10-16 LAB — LDL CHOLESTEROL, DIRECT: Direct LDL: 209.8 mg/dL

## 2012-10-16 LAB — LIPID PANEL
Cholesterol: 316 mg/dL — ABNORMAL HIGH (ref 0–200)
HDL: 47 mg/dL (ref >39.00)
Total CHOL/HDL Ratio: 7
VLDL: 42.6 mg/dL — ABNORMAL HIGH (ref 0.0–40.0)

## 2012-10-16 LAB — HEPATIC FUNCTION PANEL
Albumin: 4.2 g/dL (ref 3.5–5.2)
Alkaline Phosphatase: 63 U/L (ref 39–117)

## 2012-10-17 ENCOUNTER — Telehealth: Payer: Self-pay

## 2012-10-17 NOTE — Telephone Encounter (Signed)
Spoke with patient, schedule appointment for tomorrow to discuss labs

## 2012-10-17 NOTE — Telephone Encounter (Signed)
Message copied by Maurice Small on Thu Oct 17, 2012  9:28 AM ------      Message from: Pecola Lawless      Created: Wed Oct 16, 2012  6:58 PM       Please see me before refilling medications. Triglycerides are dramatically better but thyroid and LDL or bad cholesterol abnormalities will need to be addressed. Please bring all actual medications as well as all supplements. ------

## 2012-10-18 ENCOUNTER — Encounter: Payer: Self-pay | Admitting: Internal Medicine

## 2012-10-18 ENCOUNTER — Ambulatory Visit (INDEPENDENT_AMBULATORY_CARE_PROVIDER_SITE_OTHER): Payer: Medicare Other | Admitting: Internal Medicine

## 2012-10-18 VITALS — BP 126/80 | HR 88 | Wt 158.0 lb

## 2012-10-18 DIAGNOSIS — M1710 Unilateral primary osteoarthritis, unspecified knee: Secondary | ICD-10-CM

## 2012-10-18 DIAGNOSIS — M179 Osteoarthritis of knee, unspecified: Secondary | ICD-10-CM | POA: Insufficient documentation

## 2012-10-18 DIAGNOSIS — E039 Hypothyroidism, unspecified: Secondary | ICD-10-CM

## 2012-10-18 MED ORDER — LEVOTHYROXINE SODIUM 100 MCG PO TABS: ORAL_TABLET | ORAL | Status: DC

## 2012-10-18 MED ORDER — ROSUVASTATIN CALCIUM 20 MG PO TABS: ORAL_TABLET | ORAL | Status: DC

## 2012-10-18 MED ORDER — CELECOXIB 200 MG PO CAPS: ORAL_CAPSULE | ORAL | Status: DC

## 2012-10-18 NOTE — Progress Notes (Signed)
  Subjective:    Patient ID: [name], female    DOB: [birth year], 67 y.o.   MRN: [id number]  HPI She returns to discuss her labs. Off Crestor and on Fenofibrate alone; her triglycerides are 213 and LDL 209.8. Based on her NMR panel; LDL goal is probably less than 130.  Her TSH is 0.21. Her past history was reviewed. Pathology on the thyroid tissue at thyroidectomy in 2001 did reveal a small focus of microcarcinoma as well as Hashimoto's thyroiditis.  Significantly she has osteopenia. She is to have a followup bone density 200 gynecologist later this year. She is unable to exercise because of her hip.  She had dramatic improvement while on Celebrex. She also had a steroid injection from her orthopedist, Dr. Jillyn Hidden. Lab results reviewed & risks/ options discussed     Review of Systems  Weight stable up down pounds Some fatigue No chest pain  ;claudication; palpitations ; dyspnea; PND No abd pain/bowel changes ; myalgias No significant  lightheadedness;  syncope ; memory loss No skin/hair/nail changes     Objective:   Physical Exam Appears healthy and well-nourished & in no acute distress  No carotid bruits are present.No neck pain distention present at 10 - 15 degrees. Thyroid normal to palpation  Heart rhythm and rate are normal with no significant murmurs or gallops.S 4  Chest is clear with no increased work of breathing  There is no evidence of aortic aneurysm or renal artery bruits  Abdomen soft with no organomegaly or masses. No HJR  No clubbing, cyanosis or edema present.  Pedal pulses are intact   MS: crepitus of knees w/o effusion. Mixed arthritic finger changes  No ischemic skin changes are present . Nails healthy   Alert and oriented. Strength, tone, DTRs reflexes normal          Assessment & Plan:

## 2012-10-18 NOTE — Assessment & Plan Note (Addendum)
Because of her osteopenia; TSH suppression should be in the range of 0.35. The likelihood of residual ectopic thyroid tissue being present is extremely small.  Thyroid supplement will be decreased to one daily from one daily except one  & one half on Wednesdays.

## 2012-10-18 NOTE — Patient Instructions (Addendum)
Please  schedule fasting Labs in 10-12 weeks : BMET,Lipids, AST,ALT, anti thyroglobulin  antibodies TSH.PLEASE BRING THESE INSTRUCTIONS TO FOLLOW UP  LAB APPOINTMENT.This will guarantee correct labs are drawn, eliminating need for repeat blood sampling ( needle sticks ! ). Diagnoses /Codes: 272.4,244.9, 995.20

## 2012-10-18 NOTE — Assessment & Plan Note (Signed)
Most important will be to attempt keeping the LDL below 160; preferable goal be less than 130 basal advanced cholesterol testing.  Crestor 20 mg Monday, Wednesday, and Friday would be provided with repeat lipids after 10-12 weeks.  She'll be encouraged to continue avoiding high fructose corn syrup in her diet to control triglycerides

## 2012-10-23 ENCOUNTER — Encounter: Payer: Self-pay | Admitting: Internal Medicine

## 2012-10-24 ENCOUNTER — Other Ambulatory Visit: Payer: Self-pay | Admitting: Internal Medicine

## 2012-11-13 ENCOUNTER — Other Ambulatory Visit: Payer: Self-pay | Admitting: Internal Medicine

## 2012-12-03 ENCOUNTER — Other Ambulatory Visit: Payer: Self-pay | Admitting: Internal Medicine

## 2012-12-14 ENCOUNTER — Other Ambulatory Visit: Payer: Self-pay | Admitting: Internal Medicine

## 2012-12-26 ENCOUNTER — Encounter: Payer: Self-pay | Admitting: Internal Medicine

## 2012-12-26 ENCOUNTER — Ambulatory Visit (INDEPENDENT_AMBULATORY_CARE_PROVIDER_SITE_OTHER): Payer: Medicare Other | Admitting: Internal Medicine

## 2012-12-26 VITALS — BP 128/80 | HR 84 | Temp 98.9°F | Wt 161.0 lb

## 2012-12-26 DIAGNOSIS — J209 Acute bronchitis, unspecified: Secondary | ICD-10-CM

## 2012-12-26 DIAGNOSIS — J069 Acute upper respiratory infection, unspecified: Secondary | ICD-10-CM

## 2012-12-26 MED ORDER — AMOXICILLIN 500 MG PO CAPS: 500.0000 mg | ORAL_CAPSULE | Freq: Three times a day (TID) | ORAL | Status: DC

## 2012-12-26 MED ORDER — BENZONATATE 200 MG PO CAPS: 200.0000 mg | ORAL_CAPSULE | Freq: Three times a day (TID) | ORAL | Status: DC | PRN

## 2012-12-26 NOTE — Progress Notes (Signed)
  Subjective:    Patient ID: [name], female    DOB: 1945/08/30, 67 y.o.   MRN: [id number]  HPI  Symptoms started 12/22/12 as laryngitis followed by head congestion and throat congestion. She subsequently developed chest symptoms with cough productive of yellow sputum. She has had some dyspnea going up stairs. She has noted chills without definite fever.  She has had extrinsic symptoms with itchy, watery eyes and sneezing related to the tree pollen. Additionally she was exposed to a dusty ,tar filled apartment 4/27.  Allegra was partially beneficial; she switch to [employer] had associated sore throat and facial pain.    Review of Systems  She denies frontal headache, dental pain, nasal purulence, otic pain, or otic discharge  She is not having wheezing     Objective:   Physical Exam General appearance:good health ;well nourished; no acute distress or increased work of breathing is present.  No  lymphadenopathy about the head, neck, or axilla noted.   Eyes: No conjunctival inflammation or lid edema is present.   Ears:  External ear exam shows no significant lesions or deformities.  Otoscopic examination reveals clear canals, tympanic membranes are intact bilaterally without bulging, retraction, inflammation or discharge.  Nose:  External nasal examination shows no deformity or inflammation. Nasal mucosa are pink and moist without lesions or exudates. No septal dislocation or deviation.No obstruction to airflow.   Oral exam: Dental hygiene is good; lips and gums are healthy appearing.There is no oropharyngeal erythema or exudate noted. Hoarse  Neck:  No deformities, masses, or tenderness noted.   Heart:  Normal rate and regular rhythm. S1 and S2 normal without gallop, murmur, click, rub or other extra sounds.   Lungs:Chest clear to auscultation; no wheezes, rhonchi,rales ,or rubs present.No increased work of breathing.    Extremities:  No cyanosis, edema, or clubbing   noted    Skin: Warm & dry          Assessment & Plan:  #1 acute bronchitis w/o bronchospasm #2 URI, acute Plan: See orders and recommendations

## 2012-12-26 NOTE — Patient Instructions (Addendum)

## 2013-01-24 ENCOUNTER — Other Ambulatory Visit: Payer: Self-pay | Admitting: Internal Medicine

## 2013-02-04 ENCOUNTER — Telehealth: Payer: Self-pay | Admitting: Internal Medicine

## 2013-02-04 NOTE — Telephone Encounter (Signed)
Patient informed we recommend the TDaP ( pertussis- whooping cough component) for anyone that will be around small children age 67 or younger, patient to check with insurance company first to verify covered. Patient will call back if she would like to schedule a Nurse Visit for TDaP, patient states she will check around to see the cost at other places if not covered

## 2013-02-04 NOTE — Telephone Encounter (Signed)
Patient is calling to inquire about whether she should receive immunization for whooping cough. States that her daughter is pregnant and if she is going to be handling the baby she thought she may need it.

## 2013-02-05 ENCOUNTER — Ambulatory Visit (INDEPENDENT_AMBULATORY_CARE_PROVIDER_SITE_OTHER): Payer: Medicare Other | Admitting: *Deleted

## 2013-02-05 DIAGNOSIS — Z23 Encounter for immunization: Secondary | ICD-10-CM

## 2013-02-18 ENCOUNTER — Other Ambulatory Visit: Payer: Self-pay | Admitting: Internal Medicine

## 2013-02-19 ENCOUNTER — Other Ambulatory Visit (INDEPENDENT_AMBULATORY_CARE_PROVIDER_SITE_OTHER): Payer: Medicare Other

## 2013-02-19 DIAGNOSIS — E889 Metabolic disorder, unspecified: Secondary | ICD-10-CM

## 2013-02-19 DIAGNOSIS — E785 Hyperlipidemia, unspecified: Secondary | ICD-10-CM

## 2013-02-19 DIAGNOSIS — T887XXA Unspecified adverse effect of drug or medicament, initial encounter: Secondary | ICD-10-CM

## 2013-02-19 LAB — LIPID PANEL
Cholesterol: 235 mg/dL — ABNORMAL HIGH (ref 0–200)
HDL: 43 mg/dL (ref >39.00)
Triglycerides: 232 mg/dL — ABNORMAL HIGH (ref 0.0–149.0)

## 2013-02-19 LAB — LDL CHOLESTEROL, DIRECT: Direct LDL: 150.4 mg/dL

## 2013-02-19 LAB — ALT: ALT: 23 U/L (ref 0–35)

## 2013-02-19 LAB — BASIC METABOLIC PANEL
Calcium: 9.6 mg/dL (ref 8.4–10.5)
GFR: 51.56 mL/min — ABNORMAL LOW (ref >60.00)
Potassium: 4 mEq/L (ref 3.5–5.1)
Sodium: 139 mEq/L (ref 135–145)

## 2013-02-21 ENCOUNTER — Other Ambulatory Visit: Payer: Medicare Other

## 2013-02-22 ENCOUNTER — Encounter: Payer: Self-pay | Admitting: Internal Medicine

## 2013-02-22 ENCOUNTER — Other Ambulatory Visit: Payer: Self-pay | Admitting: Internal Medicine

## 2013-02-22 DIAGNOSIS — E039 Hypothyroidism, unspecified: Secondary | ICD-10-CM

## 2013-02-22 DIAGNOSIS — E782 Mixed hyperlipidemia: Secondary | ICD-10-CM

## 2013-03-05 ENCOUNTER — Encounter: Payer: Self-pay | Admitting: Internal Medicine

## 2013-03-05 ENCOUNTER — Telehealth: Payer: Self-pay | Admitting: *Deleted

## 2013-03-05 MED ORDER — ROSUVASTATIN CALCIUM 20 MG PO TABS: ORAL_TABLET | ORAL | Status: DC

## 2013-03-05 NOTE — Telephone Encounter (Signed)
Pt left VM that she would like to get Rx for crestor [facility] to [facility] or if available would like to get some samples. .Please advise

## 2013-03-05 NOTE — Telephone Encounter (Signed)
Rx sent 

## 2013-03-05 NOTE — Telephone Encounter (Signed)
4-6 week samples & then 90 day Rx please

## 2013-03-14 ENCOUNTER — Other Ambulatory Visit: Payer: Self-pay | Admitting: Internal Medicine

## 2013-03-22 ENCOUNTER — Other Ambulatory Visit: Payer: Self-pay | Admitting: Internal Medicine

## 2013-04-02 ENCOUNTER — Other Ambulatory Visit: Payer: Self-pay

## 2013-05-01 ENCOUNTER — Other Ambulatory Visit: Payer: Self-pay | Admitting: Internal Medicine

## 2013-05-02 NOTE — Telephone Encounter (Signed)
OK X1 

## 2013-05-02 NOTE — Telephone Encounter (Signed)
Last visit-12/26/2012  Last filled-03/14/13  UDS (02-19-13-Low Risk), contract on file  Please advise. SW, CMA

## 2013-05-07 NOTE — Telephone Encounter (Signed)
Rx filled. SW, CMA 

## 2013-05-08 ENCOUNTER — Other Ambulatory Visit: Payer: Self-pay | Admitting: Internal Medicine

## 2013-05-08 NOTE — Telephone Encounter (Signed)
Rx printed and faxed to the pharmacy.//AB/CMA 

## 2013-05-15 ENCOUNTER — Telehealth: Payer: Self-pay | Admitting: Internal Medicine

## 2013-05-15 NOTE — Telephone Encounter (Signed)
Patient states that she is in the "doughnut hole" with her Fenofibrate rx and wants to know if we have any samples for her. Please advise.

## 2013-05-15 NOTE — Telephone Encounter (Signed)
Advised pt that we do not have samples available at this time.

## 2013-05-17 ENCOUNTER — Other Ambulatory Visit: Payer: Self-pay | Admitting: Internal Medicine

## 2013-05-19 NOTE — Telephone Encounter (Signed)
Pt states she is taking 130mg  of fenofibrate, but she can get 134 mg for much less money. Please send to [facility]

## 2013-05-21 ENCOUNTER — Telehealth: Payer: Self-pay | Admitting: *Deleted

## 2013-05-21 NOTE — Telephone Encounter (Signed)
Called and spoke with patient to let her know that her prescription for fenofibrate 134 mg was sent to her pharmacy for pick up. Patient stated understanding.

## 2013-05-21 NOTE — Telephone Encounter (Signed)
Med filled.  

## 2013-05-22 ENCOUNTER — Other Ambulatory Visit: Payer: Self-pay | Admitting: *Deleted

## 2013-05-22 DIAGNOSIS — E039 Hypothyroidism, unspecified: Secondary | ICD-10-CM

## 2013-05-22 MED ORDER — FENOFIBRATE MICRONIZED 134 MG PO CAPS: ORAL_CAPSULE | ORAL | Status: DC

## 2013-05-26 ENCOUNTER — Other Ambulatory Visit: Payer: Self-pay | Admitting: Internal Medicine

## 2013-05-27 NOTE — Telephone Encounter (Signed)
Med filled. Pt has lab appt in dec.

## 2013-06-11 ENCOUNTER — Telehealth: Payer: Self-pay | Admitting: Internal Medicine

## 2013-06-11 NOTE — Telephone Encounter (Signed)
Patient called to see if we had samples of her crestor that we could give her. rosuvastatin (CRESTOR) 20 MG tablet

## 2013-06-12 NOTE — Telephone Encounter (Signed)
Spoke with the pt and informed her that sample of Crestor 20mg  was okayed,and I will leave them up front for her to pick-up.  Pt agreed.  Per Dr. Alwyn Ren okay times 1 mos.//AB/CMA

## 2013-06-13 ENCOUNTER — Ambulatory Visit (INDEPENDENT_AMBULATORY_CARE_PROVIDER_SITE_OTHER): Payer: Medicare Other | Admitting: Family Medicine

## 2013-06-13 ENCOUNTER — Encounter: Payer: Self-pay | Admitting: Family Medicine

## 2013-06-13 VITALS — BP 140/82 | HR 78 | Temp 98.6°F | Wt 157.0 lb

## 2013-06-13 DIAGNOSIS — J019 Acute sinusitis, unspecified: Secondary | ICD-10-CM

## 2013-06-13 DIAGNOSIS — J209 Acute bronchitis, unspecified: Secondary | ICD-10-CM

## 2013-06-13 DIAGNOSIS — J069 Acute upper respiratory infection, unspecified: Secondary | ICD-10-CM

## 2013-06-13 MED ORDER — BENZONATATE 200 MG PO CAPS: 200.0000 mg | ORAL_CAPSULE | Freq: Three times a day (TID) | ORAL | Status: DC | PRN

## 2013-06-13 MED ORDER — AMOXICILLIN 500 MG PO CAPS: 500.0000 mg | ORAL_CAPSULE | Freq: Three times a day (TID) | ORAL | Status: DC

## 2013-06-13 NOTE — Patient Instructions (Signed)

## 2013-06-13 NOTE — Progress Notes (Signed)
  Subjective:     [name] is a 67 y.o. female who presents for evaluation of sinus pain. Symptoms include: congestion, cough, facial pain, foul rhinorrhea, nasal congestion, sinus pressure, sore throat and laryngitis. Onset of symptoms was 4 days ago. Symptoms have been gradually worsening since that time. Past history is significant for no history of pneumonia or bronchitis. Patient is a non-smoker.  The following portions of the patient's history were reviewed and updated as appropriate: allergies, current medications, past family history, past medical history, past social history, past surgical history and problem list.  Review of Systems Pertinent items are noted in HPI.   Objective:    BP 140/82  Pulse 78  Temp(Src) 98.6 F (37 C) (Oral)  Wt 157 lb (71.215 kg)  BMI 25.72 kg/m2  SpO2 96% General appearance: alert, cooperative, appears stated age and no distress Ears: normal TM's and external ear canals both ears Nose: green discharge, moderate congestion, turbinates red, swollen, sinus tenderness bilateral Throat: abnormal findings: mild oropharyngeal erythema Neck: mild anterior cervical adenopathy, supple, symmetrical, trachea midline and thyroid not enlarged, symmetric, no tenderness/mass/nodules Lungs: clear to auscultation bilaterally Heart: S1, S2 normal    Assessment:    Acute bacterial sinusitis.    Plan:    Nasal steroids per medication orders. Antihistamines per medication orders. Amoxicillin per medication orders.

## 2013-06-18 ENCOUNTER — Ambulatory Visit (INDEPENDENT_AMBULATORY_CARE_PROVIDER_SITE_OTHER): Payer: Medicare Other

## 2013-06-18 DIAGNOSIS — Z23 Encounter for immunization: Secondary | ICD-10-CM

## 2013-06-26 ENCOUNTER — Other Ambulatory Visit: Payer: Self-pay | Admitting: Internal Medicine

## 2013-06-27 NOTE — Telephone Encounter (Signed)
LORazepam (ATIVAN) 1 MG tablet Last OV: 06/13/2013 Last Refill: 05/01/2013 UDS up-to-date; low risk

## 2013-06-27 NOTE — Telephone Encounter (Signed)
OK X1 

## 2013-07-01 ENCOUNTER — Other Ambulatory Visit: Payer: Self-pay | Admitting: Internal Medicine

## 2013-07-02 NOTE — Telephone Encounter (Signed)
Refill for lorazepam denied because it is too soon.

## 2013-08-15 ENCOUNTER — Other Ambulatory Visit (INDEPENDENT_AMBULATORY_CARE_PROVIDER_SITE_OTHER): Payer: Medicare Other

## 2013-08-15 DIAGNOSIS — E782 Mixed hyperlipidemia: Secondary | ICD-10-CM

## 2013-08-15 DIAGNOSIS — E039 Hypothyroidism, unspecified: Secondary | ICD-10-CM

## 2013-08-15 LAB — LIPID PANEL
HDL: 41.1 mg/dL (ref >39.00)
Total CHOL/HDL Ratio: 5
Triglycerides: 203 mg/dL — ABNORMAL HIGH (ref 0.0–149.0)
VLDL: 40.6 mg/dL — ABNORMAL HIGH (ref 0.0–40.0)

## 2013-08-15 LAB — BASIC METABOLIC PANEL
CO2: 30 mEq/L (ref 19–32)
Chloride: 106 mEq/L (ref 96–112)
Creatinine, Ser: 1.2 mg/dL (ref 0.4–1.2)
Glucose, Bld: 84 mg/dL (ref 70–99)
Potassium: 3.8 mEq/L (ref 3.5–5.1)
Sodium: 141 mEq/L (ref 135–145)

## 2013-08-15 LAB — TSH: TSH: 1.96 u[IU]/mL (ref 0.35–5.50)

## 2013-08-15 LAB — LDL CHOLESTEROL, DIRECT: Direct LDL: 147.8 mg/dL

## 2013-08-22 ENCOUNTER — Other Ambulatory Visit: Payer: Self-pay | Admitting: Internal Medicine

## 2013-08-25 NOTE — Telephone Encounter (Signed)
Lorazepam script faxed to patient's pharmacy. JG//CMA

## 2013-08-25 NOTE — Telephone Encounter (Signed)
LORazepam (ATIVAN) 1 MG tablet Last refill: 06/26/2013 #60, 0 refills Last OV: 12/26/2012 Contract on file; low risk

## 2013-08-25 NOTE — Telephone Encounter (Signed)
OK X1 

## 2013-08-26 ENCOUNTER — Other Ambulatory Visit: Payer: Self-pay | Admitting: Internal Medicine

## 2013-09-06 ENCOUNTER — Encounter: Payer: Self-pay | Admitting: Family Medicine

## 2013-09-06 ENCOUNTER — Ambulatory Visit (INDEPENDENT_AMBULATORY_CARE_PROVIDER_SITE_OTHER): Payer: Medicare Other | Admitting: Family Medicine

## 2013-09-06 VITALS — BP 122/84 | HR 86 | Temp 97.2°F | Wt 154.4 lb

## 2013-09-06 DIAGNOSIS — J0100 Acute maxillary sinusitis, unspecified: Secondary | ICD-10-CM

## 2013-09-06 MED ORDER — AMOXICILLIN-POT CLAVULANATE 875-125 MG PO TABS: 1.0000 | ORAL_TABLET | Freq: Two times a day (BID) | ORAL | Status: DC

## 2013-09-06 MED ORDER — FLUCONAZOLE 150 MG PO TABS: 150.0000 mg | ORAL_TABLET | Freq: Once | ORAL | Status: DC

## 2013-09-06 NOTE — Progress Notes (Signed)
Pre-visit discussion using our clinic review tool. No additional management support is needed unless otherwise documented below in the visit note.  

## 2013-09-06 NOTE — Progress Notes (Signed)
Patient Name: [name] Date of Birth: 04/16/1946 Medical Record Number: [id number]  History of Present Illness:  Patent presents with runny nose, sneezing, cough, sore throat, malaise and minimal / low-grade fever for > 2 weeks. Now the primary complaint has become sinus pressure and pain behind the eyes and in the upper, anterior face, but worst in maxillary region.  Coughing, around christmas. Seeing Dr. Linna Darner on 09/17/2013.   The patent denies sore throat as the primary complaint. Denies sthortness of breath/wheezing, high fever, chest pain, significant myalgia, otalgia, abdominal pain, changes in bowel or bladder.  PMH, PHS, Allergies, Problem List, Medications, Family History, and Social History have all been reviewed.  Patient Active Problem List   Diagnosis Date Noted  . DJD (degenerative joint disease) of knee 10/18/2012  . VERTIGO 09/30/2010  . NEPHROLITHIASIS, HX OF 05/28/2009  . NEPHRECTOMY, HX OF 05/28/2009  . HYPOTHYROIDISM 07/18/2007  . HYPERLIPIDEMIA 07/18/2007  . HYPERTENSION, ESSENTIAL NOS 07/18/2007  . OSTEOPENIA 07/15/2007  . COLONIC POLYPS, HX OF 07/15/2007    Past Medical History  Diagnosis Date  . Hyperlipidemia   . Hypertension   . Thyroid disease   . Arthritis   . Cataract   . Heart murmur     MVP  . Adenomatous colon polyp     Past Surgical History  Procedure Laterality Date  . Total abdominal hysterectomy w/ bilateral salpingoophorectomy  2003    [name] for dysfunctional menses  . Thyroidectomy  2000    benign nodule; [name]  . Tonsillectomy    . Nephrectomy      Left for 9.8cm benign complex cyst  . Dilation and curettage of uterus      X2  . Colonoscopy w/ polypectomy  [id number]    [name]  . Abdominal hysterectomy      SUPRACERVICAL HYSTERECTOMY WITH BILATERAL SALPINGO-OOPHORECTOMY    History   Social History  . Marital Status: Married    Spouse Name: N/A    Number of Children: N/A  . Years of Education: N/A    Occupational History  . Not on file.   Social History Main Topics  . Smoking status: Former Smoker    Quit date: 08/29/1975  . Smokeless tobacco: Never Used  . Alcohol Use: Yes     Comment: minimally  1 every 6 months  . Drug Use: No  . Sexual Activity: Yes     Comment: HYST   Other Topics Concern  . Not on file   Social History Narrative  . No narrative on file    Family History  Problem Relation Age of Onset  . Transient ischemic attack Father   . Coronary artery disease Father     MI @ 35  . Hyperlipidemia Mother   . Stroke Mother 49  . Thyroid disease Mother     hyperthyroid  . Urolithiasis Sister     Allergies  Allergen Reactions  . Codeine Sulfate     "made me jumpy"  . Erythromycin     nausea    Medication list reviewed and updated in full in [place].  Review of Systems: as above, eating and drinking - tolerating PO. Urinating normally. No excessive vomitting or diarrhea. O/w as above.  Physical Exam:  Filed Vitals:   09/06/13 0922  BP: 122/84  Pulse: 86  Temp: 97.2 F (36.2 C)  TempSrc: Oral  Weight: 154 lb 6.4 oz (70.035 kg)  SpO2: 97%    GEN: WDWN, Non-toxic, Atraumatic, normocephalic. A  and O x 3. HEENT: Oropharynx clear without exudate, MMM, no significant LAD, mild rhinnorhea Sinuses: Right Frontal, ethmoid, and maxillary: Tender max Left Frontal, Ethmoid, and maxillary: Tender max Ears: TM clear, COL visualized with good landmarks CV: RRR, no m/g/r. Pulm: CTA B, no wheezes, rhonchi, or crackles, normal respiratory effort. EXT: no c/c/e Psych: well oriented, neither depressed nor anxious in appearance  Assessment and Plan: Acute Sinusitis  Acute sinusitis: ABX as below.   Reviewed symptomatic care as well as ABX in this case.    Meds ordered this encounter  Medications  . amoxicillin-clavulanate (AUGMENTIN) 875-125 MG per tablet    Sig: Take 1 tablet by mouth 2 (two) times daily.    Dispense:  20 tablet     Refill:  0  . fluconazole (DIFLUCAN) 150 MG tablet    Sig: Take 1 tablet (150 mg total) by mouth once.    Dispense:  1 tablet    Refill:  0    Patient Instructions Reviewed: SINUSITIS Sinuses are cavities in facial skeleton that drain to nose. Impaired drainage and obstruction of sinus passages main cause.  Treatment: 1. Take all Antibiotics 2. Open nasal and sinus canals: Oral decongestant: Sudafed. (CAUTION IF HIGH BLOOD PRESSURE) 3. Steam inhalation 4. Humidifier in room 5. Frequent nasal saline irrigation 6. Moist heat compresses to face 7. Tylenol or Ibuprofen for pain and fever, follow directions on bottle.   Signed,  Maud Deed. Kierre Deines, MD, [place] [facility] [place] [address] Phone: [phone] Fax: [phone]

## 2013-09-09 ENCOUNTER — Other Ambulatory Visit: Payer: Self-pay | Admitting: *Deleted

## 2013-09-09 DIAGNOSIS — E039 Hypothyroidism, unspecified: Secondary | ICD-10-CM

## 2013-09-09 MED ORDER — LEVOTHYROXINE SODIUM 100 MCG PO TABS: ORAL_TABLET | ORAL | Status: DC

## 2013-09-09 NOTE — Telephone Encounter (Signed)
[facility]

## 2013-09-15 ENCOUNTER — Telehealth: Payer: Self-pay

## 2013-09-15 NOTE — Telephone Encounter (Addendum)
Left message for call back  identifiable Medication and allergies:  Reviewed and updated  90 day supply/mail order: na [facility] pharmacy: [facility] [address]   Immunizations due:  PNA  A/P:   No changes to FH, PSH or Personal Hx Flu vaccine--05/2013 Tdap--01/2013 Shingles--10/2011 CCS--12/2011--Dr Stark--adenomatous polyps--next 2018 MMG--06/2012--Benign Findings Dexa--02/2012--nml  To Discuss with Provider: Gertie Fey burning

## 2013-09-17 ENCOUNTER — Ambulatory Visit (INDEPENDENT_AMBULATORY_CARE_PROVIDER_SITE_OTHER): Payer: Medicare Other | Admitting: Internal Medicine

## 2013-09-17 ENCOUNTER — Encounter: Payer: Self-pay | Admitting: Internal Medicine

## 2013-09-17 VITALS — BP 129/72 | HR 83 | Temp 98.4°F | Ht 65.5 in | Wt 156.2 lb

## 2013-09-17 DIAGNOSIS — E782 Mixed hyperlipidemia: Secondary | ICD-10-CM

## 2013-09-17 DIAGNOSIS — E039 Hypothyroidism, unspecified: Secondary | ICD-10-CM

## 2013-09-17 DIAGNOSIS — Z0000 Encounter for general adult medical examination without abnormal findings: Secondary | ICD-10-CM

## 2013-09-17 DIAGNOSIS — Z23 Encounter for immunization: Secondary | ICD-10-CM

## 2013-09-17 MED ORDER — LEVOTHYROXINE SODIUM 100 MCG PO TABS: ORAL_TABLET | ORAL | Status: DC

## 2013-09-17 NOTE — Progress Notes (Signed)
Pre visit review using our clinic review tool, if applicable. No additional management support is needed unless otherwise documented below in the visit note. 

## 2013-09-17 NOTE — Patient Instructions (Signed)
Your next office appointment will be for TSH lab in 10 weeks.

## 2013-09-17 NOTE — Progress Notes (Signed)
Subjective:    Patient ID: [name], female    DOB: Aug 22, 1946, 68 y.o.   MRN: [id number]  HPI Medicare Wellness Visit: Psychosocial and medical history were reviewed as required by Medicare (history related to abuse, antisocial behavior , firearm risk). Social history: Caffeine:diet Pepsi , Alcohol: rare , Tobacco DJM:EQAS 1977 Exercise:see below Personal safety/fall risk:no Limitations of activities of daily living:no Seatbelt/ smoke alarm use:yes Healthcare Power of Attorney/Living Will status: in place Ophthalmologic exam status: pending Hearing evaluation status:not current Orientation: Oriented X 3 Memory and recall: good Spelling  testing: good Depression/anxiety assessment: no Foreign travel history:never Immunization status for influenza/pneumonia/ shingles /tetanus:PNA today Transfusion history: no Preventive health care maintenance status: Colonoscopy/BMD/mammogram/Pap as per protocol/standard care:GYN F/U needed;colonoscopy due 2018 Dental care:every 6 mos Chart reviewed and updated. Active issues reviewed and addressed as documented below.    Review of Systems A heart healthy diet is followed; exercise encompasses 30 minutes 3 times per week as  walking without symptoms.  Family history is negative for premature coronary disease. Advanced cholesterol testing reveals  LDL goal is less than 130 ; ideally < 100. There is medication compliance with the statin.  Low dose ASA taken Specifically denied are  chest pain, palpitations, dyspnea, or claudication.  Significant abdominal symptoms, memory deficit, or myalgias not present. Labs reviewed & risks discussed; TSH needs to be suppressed.     Objective:   Physical Exam Gen.: Healthy and well-nourished in appearance. Alert, appropriate and cooperative throughout exam.Appears younger than stated age  Head: Normocephalic without obvious abnormalities Eyes: No corneal or conjunctival inflammation noted. Pupils equal  round reactive to light and accommodation. Extraocular motion intact.  Vision grossly normal without lenses Ears: External  ear exam reveals no significant lesions or deformities. Canals clear .TMs normal, although R membrane light reflex slightly dull, no blood. Hearing is grossly normal bilaterally. Nose: External nasal exam reveals no deformity or inflammation. Nasal mucosa are pink and moist. No lesions or exudates noted.   Mouth: Oral mucosa and oropharynx reveal no lesions or exudates. Teeth in good repair. Neck: No deformities, masses, or tenderness noted. Full range of motion. Thyroid absent. No head lag. Lungs: Normal respiratory effort; chest expands symmetrically. Lungs are clear to auscultation without rales, wheezes, or increased work of breathing. Heart: Normal rate and rhythm. Normal S1 and S2. No gallop, click, or rub. S4 noted. Abdomen: Bowel sounds normal; abdomen soft and nontender. No masses, organomegaly or hernias noted. Genitalia: not examined                        Musculoskeletal/extremities: No deformity or scoliosis noted of  the thoracic or lumbar spine.   OR Accentuated curvature of upper thoracic spine. Minor creptitus of L knee. OR There is some asymmetry of the posterior thoracic musculature suggesting occult scoliosis. No clubbing, cyanosis, edema, or significant extremity  deformity noted. Range of motion normal . Tone & strength normal. Hand joints normal reveal mild PIP/ DIP changes. Fingernails healthy.  Able to lie down & sit up w/o help. Negative SLR bilaterally. Vascular: Carotid, radial artery, dorsalis pedis and  posterior tibial pulses are full and equal. No bruits present. Neurologic: Alert and oriented x3. Deep tendon reflexes symmetrical and normal.  Gait normal.   Skin: Intact without suspicious lesions or rashes. Lymph: No cervical, axillary lymphadenopathy present. Psych: Mood and affect are normal. Normally interactive  Assessment & Plan:  #1 Medicare Wellness Exam; criteria met ; data entered #2 Problem List/Diagnoses reviewed Plan:  Assessments made/ Orders entered  

## 2013-09-27 ENCOUNTER — Other Ambulatory Visit: Payer: Self-pay | Admitting: Internal Medicine

## 2013-09-29 NOTE — Telephone Encounter (Signed)
Metoprolol refilled per protocol. JG//CMA 

## 2013-10-20 ENCOUNTER — Other Ambulatory Visit: Payer: Self-pay | Admitting: Internal Medicine

## 2013-10-21 NOTE — Telephone Encounter (Signed)
OK X1 

## 2013-10-21 NOTE — Telephone Encounter (Signed)
Requesting Lorazepam 1mg  Take 1/2-1 tablet every 8 hours as needed Last refill:08-25-13;#60  Last OV-(acute):09-17-13 UDS:02-18-13-Low risk-(due) Please advise.//AB/CMA

## 2013-10-22 ENCOUNTER — Ambulatory Visit (INDEPENDENT_AMBULATORY_CARE_PROVIDER_SITE_OTHER): Payer: Medicare Other | Admitting: Gynecology

## 2013-10-22 ENCOUNTER — Other Ambulatory Visit (HOSPITAL_COMMUNITY)
Admission: RE | Admit: 2013-10-22 | Discharge: 2013-10-22 | Disposition: A | Discharge status: Home / Self Care | Payer: Medicare Other | Source: Ambulatory Visit | Attending: Gynecology | Admitting: Gynecology

## 2013-10-22 ENCOUNTER — Encounter: Payer: Self-pay | Admitting: Gynecology

## 2013-10-22 VITALS — BP 146/80 | Ht 65.0 in | Wt 155.0 lb

## 2013-10-22 DIAGNOSIS — M949 Disorder of cartilage, unspecified: Secondary | ICD-10-CM

## 2013-10-22 DIAGNOSIS — N952 Postmenopausal atrophic vaginitis: Secondary | ICD-10-CM | POA: Insufficient documentation

## 2013-10-22 DIAGNOSIS — IMO0002 Reserved for concepts with insufficient information to code with codable children: Secondary | ICD-10-CM | POA: Insufficient documentation

## 2013-10-22 DIAGNOSIS — Z124 Encounter for screening for malignant neoplasm of cervix: Secondary | ICD-10-CM

## 2013-10-22 DIAGNOSIS — Z1151 Encounter for screening for human papillomavirus (HPV): Secondary | ICD-10-CM | POA: Insufficient documentation

## 2013-10-22 DIAGNOSIS — M899 Disorder of bone, unspecified: Secondary | ICD-10-CM

## 2013-10-22 DIAGNOSIS — Z01419 Encounter for gynecological examination (general) (routine) without abnormal findings: Secondary | ICD-10-CM | POA: Insufficient documentation

## 2013-10-22 DIAGNOSIS — N951 Menopausal and female climacteric states: Secondary | ICD-10-CM

## 2013-10-22 DIAGNOSIS — M8580 Other specified disorders of bone density and structure, unspecified site: Secondary | ICD-10-CM

## 2013-10-22 MED ORDER — NONFORMULARY OR COMPOUNDED ITEM: Status: DC

## 2013-10-22 NOTE — Progress Notes (Signed)
[name] [birth year] [id number]   History:    68 y.o.  for GYN followup. Patient has been complaining of vaginal dryness, irritation and dyspareunia. Patient has not been seen in the office in July of 2013. Patient was previously been followed by Dr. Lowella Dell. Patient denies any prior history of abnormal Pap smear.Patient with prior history of supracervical hysterectomy with bilateral salpingo-oophorectomy.Dr. Linna Darner has been her primary physician who is been monitoring her for her hypertension and hypothyroidism as well as her osteopenia. No labs will be drawn today. Patient has history in the past of a left nephrectomy as a result of a large 9.8 cm benign renal cysts. She's currently being followed by the nephrologist Dr. Alinda Money once a year. Patient's last colonoscopy was in 2013 and had polyps removed the same is in 2003 in 2006 respectively.      Past medical history,surgical history, family history and social history were all reviewed and documented in the EPIC chart.  Gynecologic History No LMP recorded. Patient has had a hysterectomy. Contraception: status post hysterectomy Last Pap: 2012. Results were: normal Last mammogram: 2013. Results were: normal  Obstetric History OB History  Gravida Para Term Preterm AB SAB TAB Ectopic Multiple Living  2 2 2       2     # Outcome Date GA Lbr Len/2nd Weight Sex Delivery Anes PTL Lv  2 TRM     M SVD  N Y  1 TRM     F SVD  N Y       ROS: A ROS was performed and pertinent positives and negatives are included in the history.  GENERAL: No fevers or chills. HEENT: No change in vision, no earache, sore throat or sinus congestion. NECK: No pain or stiffness. CARDIOVASCULAR: No chest pain or pressure. No palpitations. PULMONARY: No shortness of breath, cough or wheeze. GASTROINTESTINAL: No abdominal pain, nausea, vomiting or diarrhea, melena or bright red blood per rectum. GENITOURINARY: No urinary frequency, urgency, hesitancy or dysuria.  MUSCULOSKELETAL: No joint or muscle pain, no back pain, no recent trauma. DERMATOLOGIC: No rash, no itching, no lesions. ENDOCRINE: No polyuria, polydipsia, no heat or cold intolerance. No recent change in weight. HEMATOLOGICAL: No anemia or easy bruising or bleeding. NEUROLOGIC: No headache, seizures, numbness, tingling or weakness. PSYCHIATRIC: No depression, no loss of interest in normal activity or change in sleep pattern.     Exam: chaperone present  BP 146/80  Ht 5\' 5"  (1.651 m)  Wt 155 lb (70.308 kg)  BMI 25.79 kg/m2  Body mass index is 25.79 kg/(m^2).  General appearance : Well developed well nourished female. No acute distress HEENT: Neck supple, trachea midline, no carotid bruits, no thyroidmegaly Lungs: Clear to auscultation, no rhonchi or wheezes, or rib retractions  Heart: Regular rate and rhythm, no murmurs or gallops Breast:Examined in sitting and supine position were symmetrical in appearance, no palpable masses or tenderness,  no skin retraction, no nipple inversion, no nipple discharge, no skin discoloration, no axillary or supraclavicular lymphadenopathy Abdomen: no palpable masses or tenderness, no rebound or guarding Extremities: no edema or skin discoloration or tenderness  Pelvic:  Bartholin, Urethra, Skene Glands: Within normal limits             Vagina: No gross lesions or discharge, vaginal atrophy  Cervix: No gross lesions or discharge  Uterus absent Adnexa  Without masses or tenderness  Anus and perineum  normal   Rectovaginal  normal sphincter tone without palpated masses or tenderness  Hemoccult PCP provides     Assessment/Plan:  68 y.o. female menopausal who is only suffering from vaginal dryness and dyspareunia. For her vaginal atrophy she will be placed on estradiol 0.02% vaginal twice a week. Risks benefits and pros and cons were discussed. We discussed women's health initiative study. She was reminded to do her monthly breast exam. She was  reminded to schedule her mammogram. PCP has been drawn her blood work. She is overdue also for her bone density study which her PCP will schedule. We discussed importance of calcium and vitamin D and regular exercise for osteoporosis prevention since she had been diagnosed in the past with osteopenia.  Note: This dictation was prepared with  Dragon/digital dictation along withSmart phrase technology. Any transcriptional errors that result from this process are unintentional.   Terrance Mass MD, 12:41 PM 10/22/2013

## 2013-10-22 NOTE — Telephone Encounter (Signed)
Rx printed and faxed to the pharmacy.//AB/CMA 

## 2013-10-22 NOTE — Addendum Note (Signed)
Addended by: Su Grand A on: 10/22/2013 01:00 PM   Modules accepted: Orders

## 2013-10-22 NOTE — Patient Instructions (Signed)

## 2013-11-07 ENCOUNTER — Other Ambulatory Visit: Payer: Self-pay | Admitting: Internal Medicine

## 2013-11-15 ENCOUNTER — Other Ambulatory Visit: Payer: Self-pay | Admitting: Internal Medicine

## 2013-12-18 ENCOUNTER — Other Ambulatory Visit: Payer: Self-pay

## 2013-12-18 MED ORDER — LORAZEPAM 1 MG PO TABS: ORAL_TABLET | ORAL | Status: DC

## 2013-12-18 NOTE — Telephone Encounter (Signed)
OK X1 

## 2013-12-18 NOTE — Telephone Encounter (Signed)
Lorazepam script has been faxed to [facility]

## 2013-12-18 NOTE — Telephone Encounter (Signed)
Last ov with you 09/17/13

## 2014-01-05 ENCOUNTER — Other Ambulatory Visit: Payer: Self-pay | Admitting: *Deleted

## 2014-01-05 MED ORDER — ROSUVASTATIN CALCIUM 20 MG PO TABS: ORAL_TABLET | ORAL | Status: DC

## 2014-02-04 ENCOUNTER — Other Ambulatory Visit: Payer: Self-pay

## 2014-02-04 MED ORDER — LORAZEPAM 1 MG PO TABS: ORAL_TABLET | ORAL | Status: DC

## 2014-02-04 NOTE — Telephone Encounter (Signed)
60

## 2014-03-02 ENCOUNTER — Other Ambulatory Visit: Payer: Self-pay

## 2014-03-02 DIAGNOSIS — Z1231 Encounter for screening mammogram for malignant neoplasm of breast: Secondary | ICD-10-CM

## 2014-03-06 ENCOUNTER — Ambulatory Visit
Admission: RE | Admit: 2014-03-06 | Discharge: 2014-03-06 | Disposition: A | Discharge status: Home / Self Care | Payer: Medicare Other | Source: Ambulatory Visit

## 2014-03-06 DIAGNOSIS — Z1231 Encounter for screening mammogram for malignant neoplasm of breast: Secondary | ICD-10-CM

## 2014-03-23 ENCOUNTER — Other Ambulatory Visit: Payer: Self-pay

## 2014-03-23 MED ORDER — LORAZEPAM 1 MG PO TABS: ORAL_TABLET | ORAL | Status: DC

## 2014-03-23 NOTE — Telephone Encounter (Signed)
OK X1 Label prn only, not maintenance

## 2014-03-29 ENCOUNTER — Other Ambulatory Visit: Payer: Self-pay | Admitting: Internal Medicine

## 2014-05-04 ENCOUNTER — Other Ambulatory Visit: Payer: Self-pay | Admitting: Internal Medicine

## 2014-05-05 ENCOUNTER — Other Ambulatory Visit: Payer: Self-pay | Admitting: Internal Medicine

## 2014-05-15 ENCOUNTER — Other Ambulatory Visit: Payer: Self-pay

## 2014-05-15 MED ORDER — LORAZEPAM 1 MG PO TABS: ORAL_TABLET | ORAL | Status: DC

## 2014-05-15 NOTE — Telephone Encounter (Signed)
[id number] 

## 2014-05-15 NOTE — Telephone Encounter (Signed)
OK X1 

## 2014-05-25 ENCOUNTER — Encounter: Payer: Self-pay | Admitting: Gastroenterology

## 2014-05-25 ENCOUNTER — Other Ambulatory Visit: Payer: Self-pay

## 2014-05-25 MED ORDER — FENOFIBRATE MICRONIZED 134 MG PO CAPS: ORAL_CAPSULE | ORAL | Status: DC

## 2014-06-10 ENCOUNTER — Ambulatory Visit (INDEPENDENT_AMBULATORY_CARE_PROVIDER_SITE_OTHER): Payer: Medicare Other | Admitting: *Deleted

## 2014-06-10 DIAGNOSIS — Z23 Encounter for immunization: Secondary | ICD-10-CM

## 2014-06-19 ENCOUNTER — Ambulatory Visit (INDEPENDENT_AMBULATORY_CARE_PROVIDER_SITE_OTHER): Payer: Medicare Other | Admitting: Internal Medicine

## 2014-06-19 ENCOUNTER — Encounter: Payer: Self-pay | Admitting: Internal Medicine

## 2014-06-19 VITALS — BP 160/86 | HR 76 | Temp 97.9°F | Resp 13 | Wt 152.2 lb

## 2014-06-19 DIAGNOSIS — E782 Mixed hyperlipidemia: Secondary | ICD-10-CM

## 2014-06-19 DIAGNOSIS — M7071 Other bursitis of hip, right hip: Secondary | ICD-10-CM

## 2014-06-19 DIAGNOSIS — I10 Essential (primary) hypertension: Secondary | ICD-10-CM

## 2014-06-19 DIAGNOSIS — E890 Postprocedural hypothyroidism: Secondary | ICD-10-CM

## 2014-06-19 MED ORDER — PREDNISONE 10 MG PO TABS: 10.0000 mg | ORAL_TABLET | Freq: Every day | ORAL | Status: DC

## 2014-06-19 NOTE — Progress Notes (Signed)
Pre visit review using our clinic review tool, if applicable. No additional management support is needed unless otherwise documented below in the visit note. 

## 2014-06-19 NOTE — Progress Notes (Signed)
   Subjective:    Patient ID: [name], female    DOB: July 02, 1946, 68 y.o.   MRN: [id number]  HPI  Patient is here today for follow-up.    HTN - She has not been checking her blood pressure at home.  She reports compliance with current medications; no adverse effects. No CV symptoms She does not regularly exercise but is active caring for her 5 grandchildren and her elderly mother Heart healthy diet, does not add salt.  Hyperlipidemia - She is following a modified heart healthy diet  Hypothyroidism - She reports compliance with current therapy No change in dose, manufacturer. Denies adverse effects.   She is also complaining of right hip pain that began 3-4 weeks ago.  This is in the setting of having done excessive walking at [place] with her grandchildren.  She is using Tylenol arthritis with little relief.  Her pain improves with walking.  She has a solitary kidney and has been advised to avoid NSAIDS.    Review of Systems  Constitutional: Negative for fever, chills, activity change and appetite change.  HENT: Negative.   Respiratory: Negative for cough, choking, chest tightness, shortness of breath and wheezing.   Cardiovascular: Negative for chest pain, palpitations and leg swelling.  Gastrointestinal: Negative for nausea, vomiting, abdominal pain, diarrhea, constipation and abdominal distention.  Endocrine: Negative for cold intolerance and heat intolerance.  Musculoskeletal:       Right hip pain- see HPI  Skin: Negative for rash and wound.  Neurological: Negative for dizziness, syncope, weakness, light-headedness and headaches.       Objective:   Physical Exam  Constitutional: She is oriented to person, place, and time. She appears well-developed and well-nourished. No distress.  HENT:  Head: Normocephalic and atraumatic.  Right Ear: External ear normal.  Left Ear: External ear normal.  Nose: Nose normal.  Mouth/Throat: Oropharynx is clear and moist. No  oropharyngeal exudate.  Eyes: Conjunctivae and EOM are normal. Pupils are equal, round, and reactive to light. Right eye exhibits no discharge. Left eye exhibits no discharge.  Neck: Normal range of motion. Neck supple. No thyromegaly present.  Cardiovascular: Normal rate, regular rhythm and normal heart sounds.   No murmur heard. Pulmonary/Chest: Effort normal and breath sounds normal. No respiratory distress. She has no wheezes.  Abdominal: Soft. Bowel sounds are normal. She exhibits no distension and no mass. There is no tenderness.  Musculoskeletal: Normal range of motion. She exhibits tenderness (Right greater trochanter tenderness to palpation). She exhibits no edema.  Lymphadenopathy:    She has no cervical adenopathy.  Neurological: She is alert and oriented to person, place, and time.  Skin: Skin is warm and dry. No rash noted. She is not diaphoretic.  Psychiatric: She has a normal mood and affect. Her behavior is normal. Judgment and thought content normal.          Assessment & Plan:

## 2014-06-19 NOTE — Patient Instructions (Signed)
Your next office appointment will be determined based upon review of your pending labs . Those instructions will be transmitted to you through My Chart Fill tub with hot water and soak in it twice a day to help relieve the soft tissue/musculoskeletal pain.Use an anti-inflammatory cream such as Aspercreme or Zostrix cream twice a day to the affected area as needed. In lieu of this warm moist compresses or  hot water bottle can be used. Do not apply ice .

## 2014-06-19 NOTE — Progress Notes (Signed)
   Subjective:    Patient ID: [name], female    DOB: 1946-08-17, 68 y.o.   MRN: [id number]  HPI    She is here for followup of her hypertension, dyslipidemia, and hypothyroidism  She has not been checking blood pressures at home as her daughter has her blood pressure cuff. She is compliant with present medicines without adverse effect  She has no active cardiovascular symptoms  She has no regular exercise but is active caring for her 5 grandchildren and her elderly mother. She is on a heart healthy low salt diet.  There's been no change in dose or mode of taking the thyroid. She has no adverse effects or active endocrine symptoms  An acute problem is  right hip pain which started 3-4 weeks ago after an outing with her grandchildren with excessive walking. She's used Tylenol arthritis with minimal relief. Pain actually gets better with walking. She has avoided nonsteroidals because of a solitary kidney.      Review of Systems   Chest pain, palpitations, tachycardia, exertional dyspnea, paroxysmal nocturnal dyspnea, claudication or edema are absent.  She specifically denies any new change in her hair, skin, nails.  She has no cold or heat intolerance.        Objective:   Physical Exam  Positive or pertinent findings include: There is decreased range of motion of the cervical spine. She has marked crepitus of the temporomandibular joint bilaterally. She has a soft S4. Repeat blood pressure was 130/72. There is pain to percussion over the right greater trochanteric area.   Appears healthy and well-nourished & in no acute distress No carotid bruits are present.No neck vein distention present at 10 - 15 degrees. Thyroid absent Heart rhythm and rate are normal with no gallop or murmur Chest is clear with no increased work of breathing There is no evidence of aortic aneurysm or renal artery bruits Abdomen soft with no organomegaly or masses. No HJR No clubbing, cyanosis  or edema present. Pedal pulses are intact  No ischemic skin changes are present . Fingernails healthy  Alert and oriented. Strength, tone, DTRs reflexes normal. No tremor          Assessment & Plan:  #1 right hip bursitis  #2 See Current Assessment & Plan in Problem List under specific Diagnosis

## 2014-06-20 NOTE — Assessment & Plan Note (Signed)
See repeat BP Blood pressure goals reviewed. BMET

## 2014-06-20 NOTE — Assessment & Plan Note (Signed)
Lipids, LFTs, TSH ,CK 

## 2014-06-20 NOTE — Assessment & Plan Note (Signed)
TSH 

## 2014-06-22 ENCOUNTER — Telehealth: Payer: Self-pay | Admitting: Internal Medicine

## 2014-06-22 NOTE — Telephone Encounter (Signed)
emmi mailed  °

## 2014-06-24 ENCOUNTER — Other Ambulatory Visit (INDEPENDENT_AMBULATORY_CARE_PROVIDER_SITE_OTHER): Payer: Medicare Other

## 2014-06-24 DIAGNOSIS — E782 Mixed hyperlipidemia: Secondary | ICD-10-CM

## 2014-06-24 DIAGNOSIS — E890 Postprocedural hypothyroidism: Secondary | ICD-10-CM

## 2014-06-24 DIAGNOSIS — I10 Essential (primary) hypertension: Secondary | ICD-10-CM

## 2014-06-24 LAB — LIPID PANEL
CHOL/HDL RATIO: 5
CHOLESTEROL: 243 mg/dL — AB (ref 0–200)
HDL: 49.7 mg/dL (ref >39.00)
NonHDL: 193.3
Triglycerides: 283 mg/dL — ABNORMAL HIGH (ref 0.0–149.0)
VLDL: 56.6 mg/dL — ABNORMAL HIGH (ref 0.0–40.0)

## 2014-06-24 LAB — HEPATIC FUNCTION PANEL
ALT: 21 U/L (ref 0–35)
AST: 22 U/L (ref 0–37)
Albumin: 3.9 g/dL (ref 3.5–5.2)
Alkaline Phosphatase: 65 U/L (ref 39–117)
BILIRUBIN TOTAL: 0.7 mg/dL (ref 0.2–1.2)
Bilirubin, Direct: 0.1 mg/dL (ref 0.0–0.3)
Total Protein: 7.8 g/dL (ref 6.0–8.3)

## 2014-06-24 LAB — BASIC METABOLIC PANEL
BUN: 20 mg/dL (ref 6–23)
CHLORIDE: 104 meq/L (ref 96–112)
CO2: 27 meq/L (ref 19–32)
CREATININE: 1.2 mg/dL (ref 0.4–1.2)
Calcium: 9.3 mg/dL (ref 8.4–10.5)
GFR: 49.81 mL/min — ABNORMAL LOW (ref >60.00)
Glucose, Bld: 84 mg/dL (ref 70–99)
POTASSIUM: 4 meq/L (ref 3.5–5.1)
Sodium: 139 mEq/L (ref 135–145)

## 2014-06-24 LAB — TSH: TSH: 0.37 u[IU]/mL (ref 0.35–4.50)

## 2014-06-24 LAB — LDL CHOLESTEROL, DIRECT: LDL DIRECT: 148.1 mg/dL

## 2014-06-24 LAB — CK: CK TOTAL: 76 U/L (ref 7–177)

## 2014-06-29 ENCOUNTER — Encounter: Payer: Self-pay | Admitting: Internal Medicine

## 2014-07-01 ENCOUNTER — Telehealth: Payer: Self-pay | Admitting: Internal Medicine

## 2014-07-01 NOTE — Telephone Encounter (Signed)
Results have been mailed

## 2014-07-01 NOTE — Telephone Encounter (Signed)
Pt called in and said that she can not get into my chart to get lab results.  She want to know if nurse could call her or mail her results of last labs

## 2014-07-06 ENCOUNTER — Other Ambulatory Visit: Payer: Self-pay

## 2014-07-06 MED ORDER — LORAZEPAM 1 MG PO TABS: ORAL_TABLET | ORAL | Status: DC

## 2014-07-06 NOTE — Telephone Encounter (Signed)
OK X1 

## 2014-07-06 NOTE — Telephone Encounter (Signed)
[facility] 

## 2014-07-30 ENCOUNTER — Other Ambulatory Visit: Payer: Self-pay

## 2014-07-30 MED ORDER — LEVOTHYROXINE SODIUM 100 MCG PO TABS: ORAL_TABLET | ORAL | Status: DC

## 2014-08-02 ENCOUNTER — Other Ambulatory Visit: Payer: Self-pay | Admitting: Internal Medicine

## 2014-08-17 ENCOUNTER — Other Ambulatory Visit: Payer: Self-pay

## 2014-08-17 MED ORDER — LORAZEPAM 1 MG PO TABS: ORAL_TABLET | ORAL | Status: DC

## 2014-08-17 NOTE — Telephone Encounter (Signed)
OK X1 

## 2014-08-17 NOTE — Telephone Encounter (Signed)
[id number]

## 2014-08-23 ENCOUNTER — Other Ambulatory Visit: Payer: Self-pay | Admitting: Internal Medicine

## 2014-08-24 NOTE — Telephone Encounter (Signed)
Please verify with Pharmacy when Rxed & by whom; thanks

## 2014-08-24 NOTE — Telephone Encounter (Signed)
I do not see this on patient's current medication list

## 2014-08-25 ENCOUNTER — Other Ambulatory Visit: Payer: Self-pay | Admitting: Internal Medicine

## 2014-09-23 DIAGNOSIS — N200 Calculus of kidney: Secondary | ICD-10-CM | POA: Diagnosis not present

## 2014-09-26 ENCOUNTER — Other Ambulatory Visit: Payer: Self-pay | Admitting: Internal Medicine

## 2014-10-02 ENCOUNTER — Telehealth: Payer: Self-pay | Admitting: *Deleted

## 2014-10-02 NOTE — Telephone Encounter (Signed)
Left msg on triage wanting to know if md have crestor 20 mg samples until she see him on the 2/19...Johny Chess

## 2014-10-02 NOTE — Telephone Encounter (Signed)
Just came in mail yesterday; please pick up

## 2014-10-02 NOTE — Telephone Encounter (Signed)
Called pt no answer LMOM samples ready for pick-up...[name]

## 2014-10-16 ENCOUNTER — Other Ambulatory Visit (INDEPENDENT_AMBULATORY_CARE_PROVIDER_SITE_OTHER): Payer: Medicare Other

## 2014-10-16 ENCOUNTER — Ambulatory Visit (INDEPENDENT_AMBULATORY_CARE_PROVIDER_SITE_OTHER): Payer: Medicare Other | Admitting: Internal Medicine

## 2014-10-16 ENCOUNTER — Encounter: Payer: Self-pay | Admitting: Internal Medicine

## 2014-10-16 VITALS — BP 138/88 | HR 84 | Temp 97.8°F | Ht 66.0 in | Wt 151.2 lb

## 2014-10-16 DIAGNOSIS — Z86010 Personal history of colonic polyps: Secondary | ICD-10-CM | POA: Diagnosis not present

## 2014-10-16 DIAGNOSIS — Z0000 Encounter for general adult medical examination without abnormal findings: Secondary | ICD-10-CM

## 2014-10-16 DIAGNOSIS — E782 Mixed hyperlipidemia: Secondary | ICD-10-CM

## 2014-10-16 DIAGNOSIS — I10 Essential (primary) hypertension: Secondary | ICD-10-CM | POA: Diagnosis not present

## 2014-10-16 DIAGNOSIS — E890 Postprocedural hypothyroidism: Secondary | ICD-10-CM

## 2014-10-16 DIAGNOSIS — E785 Hyperlipidemia, unspecified: Secondary | ICD-10-CM | POA: Diagnosis not present

## 2014-10-16 LAB — CBC WITH DIFFERENTIAL/PLATELET
BASOS PCT: 0.5 % (ref 0.0–3.0)
Basophils Absolute: 0 10*3/uL (ref 0.0–0.1)
EOS PCT: 4 % (ref 0.0–5.0)
Eosinophils Absolute: 0.3 10*3/uL (ref 0.0–0.7)
HEMATOCRIT: 43.3 % (ref 36.0–46.0)
HEMOGLOBIN: 14.8 g/dL (ref 12.0–15.0)
LYMPHS ABS: 1.5 10*3/uL (ref 0.7–4.0)
Lymphocytes Relative: 17.7 % (ref 12.0–46.0)
MCHC: 34.2 g/dL (ref 30.0–36.0)
MCV: 89.9 fl (ref 78.0–100.0)
MONO ABS: 0.7 10*3/uL (ref 0.1–1.0)
Monocytes Relative: 7.9 % (ref 3.0–12.0)
Neutro Abs: 5.8 10*3/uL (ref 1.4–7.7)
Neutrophils Relative %: 69.9 % (ref 43.0–77.0)
PLATELETS: 358 10*3/uL (ref 150.0–400.0)
RBC: 4.82 Mil/uL (ref 3.87–5.11)
RDW: 12.6 % (ref 11.5–15.5)
WBC: 8.3 10*3/uL (ref 4.0–10.5)

## 2014-10-16 LAB — LIPID PANEL
Cholesterol: 240 mg/dL — ABNORMAL HIGH (ref 0–200)
HDL: 48.3 mg/dL (ref >39.00)
NonHDL: 191.7
Total CHOL/HDL Ratio: 5
Triglycerides: 246 mg/dL — ABNORMAL HIGH (ref 0.0–149.0)
VLDL: 49.2 mg/dL — ABNORMAL HIGH (ref 0.0–40.0)

## 2014-10-16 LAB — HEPATIC FUNCTION PANEL
ALBUMIN: 4.5 g/dL (ref 3.5–5.2)
ALT: 17 U/L (ref 0–35)
AST: 20 U/L (ref 0–37)
Alkaline Phosphatase: 74 U/L (ref 39–117)
BILIRUBIN TOTAL: 0.4 mg/dL (ref 0.2–1.2)
Bilirubin, Direct: 0.1 mg/dL (ref 0.0–0.3)
Total Protein: 7.7 g/dL (ref 6.0–8.3)

## 2014-10-16 LAB — LDL CHOLESTEROL, DIRECT: LDL DIRECT: 144 mg/dL

## 2014-10-16 LAB — CK: CK TOTAL: 67 U/L (ref 7–177)

## 2014-10-16 LAB — TSH: TSH: 0.27 u[IU]/mL — ABNORMAL LOW (ref 0.35–4.50)

## 2014-10-16 NOTE — Assessment & Plan Note (Signed)
CBC

## 2014-10-16 NOTE — Progress Notes (Signed)
Pre visit review using our clinic review tool, if applicable. No additional management support is needed unless otherwise documented below in the visit note. 

## 2014-10-16 NOTE — Progress Notes (Signed)
Subjective:    Patient ID: [name], female    DOB: 1945-11-30, 69 y.o.   MRN: [id number]  HPI Medicare Wellness Visit: Psychosocial and medical history were reviewed as required by Medicare (history related to abuse, antisocial behavior , firearm risk). 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 Prevnar 13 needed Transfusion history:no Preventive health care maintenance status: Colonoscopy/BMD/mammogram/Pap as per protocol/standard care: UTD Dental care:every 12 mos Chart reviewed and updated. Active issues reviewed and addressed as documented below.  She's been compliant with her antihypertensive and cholesterol medicines without adverse effect. She is on a modified heart healthy, low-salt diet. Exercises described above. She has no associated cardiopulmonary, neuromuscular or GI symptoms    Review of Systems  Significant headaches, epistaxis, chest pain, palpitations, exertional dyspnea, claudication, paroxysmal nocturnal dyspnea, or edema absent. No GI symptoms , memory loss or myalgias Unexplained weight loss, abdominal pain, significant dyspepsia, dysphagia, melena, rectal bleeding, or persistently small caliber stools are denied.       Objective:   Physical Exam  Gen.: Adequately nourished in appearance. Alert, appropriate and cooperative throughout exam.  Appears younger than stated age  Head:  Normocephalic without obvious abnormalities  Eyes: No corneal or conjunctival inflammation noted. Pupils equal round reactive to light and accommodation. Extraocular motion intact.  Ears: External  ear exam reveals no significant lesions or deformities. Canals clear .TMs normal. Hearing is grossly normal bilaterally. Nose: External nasal exam reveals no deformity or inflammation. Nasal mucosa are pink and moist. No lesions or exudates noted.   Mouth: Oral mucosa and oropharynx reveal no lesions or exudates. Osteoma hard palate. Teeth in good repair. Neck: No deformities, masses, or tenderness noted. Range of motion decreased. Thyroid surgically absent. Lungs: Normal respiratory effort; chest expands symmetrically. Lungs are clear to auscultation without rales, wheezes, or increased work of breathing. Heart: Normal rate and rhythm. Normal S1 and S2. No gallop, click, or rub. No murmur. Abdomen: Bowel sounds normal; abdomen soft and nontender. No masses, organomegaly or hernias noted. Genitalia: as per Gyn                                  Musculoskeletal/extremities: No deformity or scoliosis noted of  the thoracic or lumbar spine.  No clubbing, cyanosis, edema, or significant extremity  deformity noted.  Range of motion normal . Tone & strength normal. Hand joints reveal isolated  DJD DIP changes.  Fingernail  health good. Crepitus of knees  Able to lie down & sit up w/o help.  Negative SLR bilaterally Vascular: Carotid, radial artery, dorsalis pedis and  posterior tibial pulses are full and equal. No bruits present. Neurologic: Alert and oriented x3. Deep tendon reflexes symmetrical and normal.  Gait normal        Skin: Intact without suspicious lesions or rashes. Lymph: No cervical, axillary lymphadenopathy present. Psych: Mood and affect are normal. Normally interactive  Assessment & Plan:  See  Current Assessment & Plan in Problem List under specific DiagnosisThe labs will be reviewed and risks and options assessed. Written recommendations will be provided by mail or directly through My Chart.Further evaluation or change in medical therapy will be directed by those results.

## 2014-10-16 NOTE — Patient Instructions (Signed)
Minimal Blood Pressure Goal= AVERAGE < 140/90;  Ideal is an AVERAGE < 135/85. This AVERAGE should be calculated from @ least 5-7 BP readings taken @ different times of day on different days of week. You should not respond to isolated BP readings , but rather the AVERAGE for that week .Please bring your  blood pressure cuff to office visits to verify that it is reliable.It  can also be checked against the blood pressure device at the pharmacy. Finger or wrist cuffs are not dependable; an arm cuff is.   Your next office appointment will be determined based upon review of your pending labs  Those instructions will be transmitted to you through My Chart   Critical values will be called. Followup as needed for any active or acute issue. Please report any significant change in your symptoms.

## 2014-10-16 NOTE — Assessment & Plan Note (Signed)
TSH 

## 2014-10-16 NOTE — Assessment & Plan Note (Signed)
Lipids, LFTs, TSH ,CK 

## 2014-10-28 ENCOUNTER — Other Ambulatory Visit: Payer: Self-pay

## 2014-10-28 MED ORDER — LORAZEPAM 1 MG PO TABS: ORAL_TABLET | ORAL | Status: DC

## 2014-10-28 NOTE — Telephone Encounter (Signed)
[facility] [address]

## 2014-10-28 NOTE — Telephone Encounter (Signed)
OK X1 

## 2014-10-31 ENCOUNTER — Other Ambulatory Visit: Payer: Self-pay | Admitting: Internal Medicine

## 2014-11-10 ENCOUNTER — Telehealth: Payer: Self-pay | Admitting: Internal Medicine

## 2014-11-10 MED ORDER — ONDANSETRON HCL 4 MG PO TABS: 4.0000 mg | ORAL_TABLET | Freq: Four times a day (QID) | ORAL | Status: DC | PRN

## 2014-11-10 NOTE — Telephone Encounter (Signed)
Zofran 4 mg q 6 hrs prn #6 Stay on clear liquids for 48-72 hours or until bowels are normal.This would include  jello, sherbert (NOT ice cream), Lipton's chicken noodle soup(NOT cream based soups),Gatorade Lite, flat Ginger ale (without High Fructose Corn Syrup),dry toast or crackers, baked potato.No milk , dairy or grease until bowels are formed. Align , a [employer] , daily if stools are loose. Immodium AD for frankly watery stool. Report increasing pain, fever or rectal bleeding

## 2014-11-10 NOTE — Telephone Encounter (Signed)
Phone call to patient. She has been advised of Dr Hopper's response. [facility] [address]

## 2014-11-10 NOTE — Telephone Encounter (Signed)
Pt called in said that she has had Alinda [place]err[place] virus since last thur.  She wanted to know if Hop could call in some meds for being nauseas?      [facility]

## 2014-11-23 ENCOUNTER — Other Ambulatory Visit: Payer: Self-pay | Admitting: Gynecology

## 2014-11-25 ENCOUNTER — Other Ambulatory Visit: Payer: Self-pay | Admitting: Internal Medicine

## 2014-12-09 ENCOUNTER — Encounter: Payer: Self-pay | Admitting: Gynecology

## 2014-12-09 ENCOUNTER — Ambulatory Visit (INDEPENDENT_AMBULATORY_CARE_PROVIDER_SITE_OTHER): Payer: Medicare Other | Admitting: Gynecology

## 2014-12-09 VITALS — BP 138/88 | Ht 65.0 in | Wt 151.0 lb

## 2014-12-09 DIAGNOSIS — N952 Postmenopausal atrophic vaginitis: Secondary | ICD-10-CM

## 2014-12-09 DIAGNOSIS — Z780 Asymptomatic menopausal state: Secondary | ICD-10-CM

## 2014-12-09 DIAGNOSIS — Z01419 Encounter for gynecological examination (general) (routine) without abnormal findings: Secondary | ICD-10-CM | POA: Diagnosis not present

## 2014-12-09 NOTE — Progress Notes (Signed)
[name] 08/27/1946 [id number]   History:    69 y.o.  for annual gyn exam with no complaints today. Patient has done well with the vaginal estrogen twice a week for vaginal atrophy.Patient denies any prior history of abnormal Pap smear.Patient with prior history of supracervical hysterectomy with bilateral salpingo-oophorectomy.Dr. Linna Darner has been her primary physician who is been monitoring her for her hypertension and hypothyroidism as well as her osteopenia. No labs will be drawn today. Patient has history in the past of a left nephrectomy as a result of a large 9.8 cm benign renal cysts. She's currently being followed by the nephrologist Dr. Alinda Money once a year. Patient's last colonoscopy was in 2013 and had polyps removed the same is in 2003 in 2006 respectively. Patient has not had a bone density study since 2012 at another facility which was normal. Patient also has hypothyroidism as a result of total thyroidectomy for benign thyroid pathology for which I do not have the report.  Past medical history,surgical history, family history and social history were all reviewed and documented in the EPIC chart.  Gynecologic History No LMP recorded. Patient has had a hysterectomy. Contraception: status post hysterectomy Last Pap: 2015. Results were: normal Last mammogram: 2015. Results were: normal  Obstetric History OB History  Gravida Para Term Preterm AB SAB TAB Ectopic Multiple Living  2 2 2       2     # Outcome Date GA Lbr Len/2nd Weight Sex Delivery Anes PTL Lv  2 Term     M Vag-Spont  N Y  1 Term     F Vag-Spont  N Y       ROS: A ROS was performed and pertinent positives and negatives are included in the history.  GENERAL: No fevers or chills. HEENT: No change in vision, no earache, sore throat or sinus congestion. NECK: No pain or stiffness. CARDIOVASCULAR: No chest pain or pressure. No palpitations. PULMONARY: No shortness of breath, cough or wheeze. GASTROINTESTINAL: No  abdominal pain, nausea, vomiting or diarrhea, melena or bright red blood per rectum. GENITOURINARY: No urinary frequency, urgency, hesitancy or dysuria. MUSCULOSKELETAL: No joint or muscle pain, no back pain, no recent trauma. DERMATOLOGIC: No rash, no itching, no lesions. ENDOCRINE: No polyuria, polydipsia, no heat or cold intolerance. No recent change in weight. HEMATOLOGICAL: No anemia or easy bruising or bleeding. NEUROLOGIC: No headache, seizures, numbness, tingling or weakness. PSYCHIATRIC: No depression, no loss of interest in normal activity or change in sleep pattern.     Exam: chaperone present  BP 138/88 mmHg  Ht 5\' 5"  (1.651 m)  Wt 151 lb (68.493 kg)  BMI 25.13 kg/m2  Body mass index is 25.13 kg/(m^2).  General appearance : Well developed well nourished female. No acute distress HEENT: Eyes: no retinal hemorrhage or exudates,  Neck supple, trachea midline, no carotid bruits, no thyroidmegaly Lungs: Clear to auscultation, no rhonchi or wheezes, or rib retractions  Heart: Regular rate and rhythm, no murmurs or gallops Breast:Examined in sitting and supine position were symmetrical in appearance, no palpable masses or tenderness,  no skin retraction, no nipple inversion, no nipple discharge, no skin discoloration, no axillary or supraclavicular lymphadenopathy Abdomen: no palpable masses or tenderness, no rebound or guarding Extremities: no edema or skin discoloration or tenderness  Pelvic:  Bartholin, Urethra, Skene Glands: Within normal limits             Vagina: No gross lesions or discharge  Cervix: No gross lesions or  discharge  Uterus  absent  Adnexa  Without masses or tenderness  Anus and perineum  normal   Rectovaginal  normal sphincter tone without palpated masses or tenderness             Hemoccult cards provided     Assessment/Plan:  69 y.o. female for annual exam postmenopausal vaginal atrophy doing well with twice-weekly vaginal estrogen. Pap smear not performed  in accordance to the new guidelines. Patient will schedule a bone density study here in the office the next few weeks. We discussed importance of calcium vitamin D and regular exercise for osteoporosis prevention. We discussed importance of monthly breast exams. Patient to submit to the office the fecal Hemoccult cards for testing.   Terrance Mass MD, 12:08 PM 12/09/2014

## 2014-12-09 NOTE — Patient Instructions (Signed)

## 2015-01-04 ENCOUNTER — Other Ambulatory Visit: Payer: Self-pay

## 2015-01-04 MED ORDER — LORAZEPAM 1 MG PO TABS: ORAL_TABLET | ORAL | Status: DC

## 2015-01-04 NOTE — Telephone Encounter (Signed)
[facility] [address]

## 2015-01-04 NOTE — Telephone Encounter (Signed)
OK X1 

## 2015-01-20 ENCOUNTER — Other Ambulatory Visit: Payer: Medicare Other | Admitting: Anesthesiology

## 2015-01-20 DIAGNOSIS — Z1211 Encounter for screening for malignant neoplasm of colon: Secondary | ICD-10-CM | POA: Diagnosis not present

## 2015-01-24 ENCOUNTER — Other Ambulatory Visit: Payer: Self-pay | Admitting: Internal Medicine

## 2015-01-26 ENCOUNTER — Other Ambulatory Visit: Payer: Self-pay

## 2015-01-26 MED ORDER — LEVOTHYROXINE SODIUM 100 MCG PO TABS: ORAL_TABLET | ORAL | Status: DC

## 2015-01-26 NOTE — Telephone Encounter (Signed)
Synthroid rx sent to pharm

## 2015-02-03 ENCOUNTER — Telehealth: Payer: Self-pay | Admitting: Internal Medicine

## 2015-02-03 ENCOUNTER — Other Ambulatory Visit: Payer: Self-pay | Admitting: Internal Medicine

## 2015-02-03 ENCOUNTER — Other Ambulatory Visit: Payer: Self-pay

## 2015-02-03 DIAGNOSIS — Z1231 Encounter for screening mammogram for malignant neoplasm of breast: Secondary | ICD-10-CM

## 2015-02-03 MED ORDER — LEVOTHYROXINE SODIUM 100 MCG PO TABS: ORAL_TABLET | ORAL | Status: DC

## 2015-02-03 MED ORDER — BENAZEPRIL HCL 10 MG PO TABS: 10.0000 mg | ORAL_TABLET | Freq: Every day | ORAL | Status: DC

## 2015-02-03 MED ORDER — ATORVASTATIN CALCIUM 10 MG PO TABS: 10.0000 mg | ORAL_TABLET | Freq: Every day | ORAL | Status: DC

## 2015-02-03 NOTE — Telephone Encounter (Signed)
benazipril rx sent to pharm

## 2015-02-03 NOTE — Telephone Encounter (Signed)
Patient is making appt for August---sent additional #45 tabs synthroid, also ordered atorvastatin per dr hopper instead of crestor--patient advised

## 2015-02-03 NOTE — Telephone Encounter (Signed)
Patient called regarding her refill for levothyroxine and it states she needs an office visit before any more refills. She had a physical back in February and she is wondering if this is correct. Also, Dr. Linna Darner usually gives her samples of Crestor and she is out and wondering if you have more.

## 2015-02-17 DIAGNOSIS — H2513 Age-related nuclear cataract, bilateral: Secondary | ICD-10-CM | POA: Diagnosis not present

## 2015-02-17 DIAGNOSIS — H524 Presbyopia: Secondary | ICD-10-CM | POA: Diagnosis not present

## 2015-02-20 ENCOUNTER — Other Ambulatory Visit: Payer: Self-pay | Admitting: Internal Medicine

## 2015-02-22 ENCOUNTER — Other Ambulatory Visit: Payer: Self-pay

## 2015-02-23 ENCOUNTER — Other Ambulatory Visit: Payer: Self-pay | Admitting: Gynecology

## 2015-02-24 ENCOUNTER — Encounter: Payer: Self-pay | Admitting: Internal Medicine

## 2015-02-24 ENCOUNTER — Ambulatory Visit (INDEPENDENT_AMBULATORY_CARE_PROVIDER_SITE_OTHER): Payer: Medicare Other | Admitting: Internal Medicine

## 2015-02-24 VITALS — HR 80 | Temp 98.5°F | Resp 16 | Wt 155.0 lb

## 2015-02-24 DIAGNOSIS — D485 Neoplasm of uncertain behavior of skin: Secondary | ICD-10-CM

## 2015-02-24 DIAGNOSIS — M67471 Ganglion, right ankle and foot: Secondary | ICD-10-CM

## 2015-02-24 NOTE — Patient Instructions (Addendum)
The Podiatry  referral will be scheduled and you'll be notified of the time.Please call the Referral Co-Ordinator @ [phone] if you have not been notified of appointment time within 7-10 days.  Use Eucerin or Aveeno Daily  Moisturizing Lotion  twice a day to the left arm lesion.Dermatology referral if not resolved in 4 weeks.

## 2015-02-24 NOTE — Progress Notes (Signed)
   Subjective:    Patient ID: [name], female    DOB: 1946/03/30, 69 y.o.   MRN: [id number]  HPI  2-3 weeks ago she noted a painless raised cystic lesion over the dorsum of the right foot at the base of the great toe. There was no trauma or trigger for this. She has no associated musculoskeletal or constitutional symptoms. She has no history of gout.   For the last several months she has noted a reddish lesion over the lateral aspect of the left upper arm.   Review of Systems She specifically denies fever, chills, sweats, weight loss.  His been no associated rash, vesicle or pustule formation.     Objective:   Physical Exam  Pertinent or positive findings include: There is a 7 x 8 mm cystic lesion which transilluminates at the base of the right great toe. It appears medial to the tendon. Proximal to this is a 11 x 4 mm elongated ganglion cyst. There is no tenderness to palpation over the first metatarsophalangeal joint. There is no change in color or temperature of the skin in this area.  She has a 5 x 7 slightly irregular erythematous lesion over the mid lateral upper arm on the left. This does not blanch with pressure.  General appearance :adequately nourished; in no distress.  Eyes: No conjunctival inflammation or scleral icterus is present.   Heart:  Normal rate and regular rhythm. S1 and S2 normal without gallop, murmur, click, rub or other extra sounds    Lungs:Chest clear to auscultation; no wheezes, rhonchi,rales ,or rubs present.No increased work of breathing.   Abdomen: bowel sounds normal, soft and non-tender without masses, organomegaly or hernias noted.  No guarding or rebound.   Vascular : all pulses equal ; no bruits present.  Skin:Warm & dry ; no tenting or jaundice   Lymphatic: No lymphadenopathy is noted about the head, neck, axilla.   Neuro: Strength, tone  normal.        Assessment & Plan:  #1 probable ganglion cysts right great toe.  Associated osteoarthritis or  gout not suggested.  #2 erythematous neoplasm left upper extremity. Aveeno daily moisturizing lotion twice a day will be employed. If there is not dramatic improvement over the next 3-4 weeks; Dermatology referral recommended  Plan: Pathophysiology of ganglion cyst discussed. Podiatry referral

## 2015-02-24 NOTE — Progress Notes (Signed)
Pre visit review using our clinic review tool, if applicable. No additional management support is needed unless otherwise documented below in the visit note. 

## 2015-03-03 ENCOUNTER — Ambulatory Visit: Payer: Self-pay

## 2015-03-03 ENCOUNTER — Ambulatory Visit (INDEPENDENT_AMBULATORY_CARE_PROVIDER_SITE_OTHER): Payer: Medicare Other | Admitting: Podiatry

## 2015-03-03 ENCOUNTER — Encounter: Payer: Self-pay | Admitting: Podiatry

## 2015-03-03 VITALS — BP 152/81 | HR 76 | Resp 15

## 2015-03-03 DIAGNOSIS — M779 Enthesopathy, unspecified: Secondary | ICD-10-CM

## 2015-03-03 DIAGNOSIS — M67471 Ganglion, right ankle and foot: Secondary | ICD-10-CM

## 2015-03-03 DIAGNOSIS — M205X9 Other deformities of toe(s) (acquired), unspecified foot: Secondary | ICD-10-CM | POA: Diagnosis not present

## 2015-03-03 MED ORDER — TRIAMCINOLONE ACETONIDE 10 MG/ML IJ SUSP
10.0000 mg | Freq: Once | INTRAMUSCULAR | Status: AC
  Administered 2015-03-03: 10 mg

## 2015-03-03 NOTE — Progress Notes (Signed)
   Subjective:    Patient ID: [name], female    DOB: 09/13/1945, 69 y.o.   MRN: [id number]  HPI Patient presents with a ganglion cyst on their great toe of their right foot. Pt stated she has no pain with this. It has gotten larger over the last 3 weeks. Dr. Linna Darner told pt they had a ganglion cyst and referred patient over for appointment.   Review of Systems  Musculoskeletal: Positive for arthralgias.  All other systems reviewed and are negative.      Objective:   Physical Exam        Assessment & Plan:

## 2015-03-03 NOTE — Progress Notes (Signed)
Subjective:     Patient ID: [name], female   DOB: 1946/04/05, 69 y.o.   MRN: [id number]  HPI patient presents with pain on top of the first metatarsal right of a minimal nature but a small nodule and just concerned about what it was and is referred by family physician. Also thinks there may be some restriction of motion in the left big toe joint   Review of Systems  All other systems reviewed and are negative.      Objective:   Physical Exam  Constitutional: She is oriented to person, place, and time.  Cardiovascular: Intact distal pulses.   Musculoskeletal: Normal range of motion.  Neurological: She is oriented to person, place, and time.  Skin: Skin is warm.  Nursing note and vitals reviewed.  neurovascular status intact muscle strength adequate range of motion within normal limits with small nodule in the dorsum of the right first metatarsal that has pinkish discoloration localized in nature with mild restriction of motion first MPJ bilateral but no crepitus within the joint and minimal discomfort when palpated     Assessment:     Possible spur formation versus ganglionic cyst dorsal right first metatarsal with hallux limitus condition and spur formation present    Plan:     H&P x-rays reviewed with patient and discussed elongated first metatarsal segment with spur formation. Carefully injected the dorsal capsule 3 milligrams kenalog 5 mg Xylocaine and advised on physical therapy for the area and if it were to start to become more tender we will given need to consider spur excision and possible other treatment. Educated her on hallux limitus and causes and treatments of both feet

## 2015-03-10 ENCOUNTER — Ambulatory Visit
Admission: RE | Admit: 2015-03-10 | Discharge: 2015-03-10 | Disposition: A | Discharge status: Home / Self Care | Payer: Medicare Other | Source: Ambulatory Visit | Attending: Gynecology | Admitting: Gynecology

## 2015-03-10 ENCOUNTER — Ambulatory Visit
Admission: RE | Admit: 2015-03-10 | Discharge: 2015-03-10 | Disposition: A | Discharge status: Home / Self Care | Payer: Medicare Other | Source: Ambulatory Visit

## 2015-03-10 DIAGNOSIS — Z1231 Encounter for screening mammogram for malignant neoplasm of breast: Secondary | ICD-10-CM | POA: Diagnosis not present

## 2015-03-10 DIAGNOSIS — M85832 Other specified disorders of bone density and structure, left forearm: Secondary | ICD-10-CM | POA: Diagnosis not present

## 2015-03-10 DIAGNOSIS — Z780 Asymptomatic menopausal state: Secondary | ICD-10-CM | POA: Diagnosis not present

## 2015-03-10 DIAGNOSIS — M85852 Other specified disorders of bone density and structure, left thigh: Secondary | ICD-10-CM | POA: Diagnosis not present

## 2015-03-11 ENCOUNTER — Other Ambulatory Visit: Payer: Self-pay | Admitting: Internal Medicine

## 2015-03-12 ENCOUNTER — Other Ambulatory Visit: Payer: Self-pay | Admitting: Internal Medicine

## 2015-03-23 ENCOUNTER — Other Ambulatory Visit: Payer: Self-pay | Admitting: Emergency Medicine

## 2015-03-23 ENCOUNTER — Telehealth: Payer: Self-pay | Admitting: Emergency Medicine

## 2015-03-23 MED ORDER — LORAZEPAM 1 MG PO TABS: ORAL_TABLET | ORAL | Status: DC

## 2015-03-23 NOTE — Telephone Encounter (Signed)
Refill request for Lorazepam, last OV 02/21/15. Please advise

## 2015-03-23 NOTE — Telephone Encounter (Signed)
OK X1 

## 2015-04-29 DIAGNOSIS — J449 Chronic obstructive pulmonary disease, unspecified: Secondary | ICD-10-CM | POA: Diagnosis not present

## 2015-05-07 ENCOUNTER — Encounter: Payer: Self-pay | Admitting: Internal Medicine

## 2015-05-07 ENCOUNTER — Other Ambulatory Visit (INDEPENDENT_AMBULATORY_CARE_PROVIDER_SITE_OTHER): Payer: Medicare Other

## 2015-05-07 ENCOUNTER — Ambulatory Visit (INDEPENDENT_AMBULATORY_CARE_PROVIDER_SITE_OTHER): Payer: Medicare Other | Admitting: Internal Medicine

## 2015-05-07 VITALS — BP 138/88 | HR 83 | Temp 97.5°F | Ht 65.0 in | Wt 154.0 lb

## 2015-05-07 DIAGNOSIS — I10 Essential (primary) hypertension: Secondary | ICD-10-CM | POA: Diagnosis not present

## 2015-05-07 DIAGNOSIS — E890 Postprocedural hypothyroidism: Secondary | ICD-10-CM

## 2015-05-07 DIAGNOSIS — D489 Neoplasm of uncertain behavior, unspecified: Secondary | ICD-10-CM

## 2015-05-07 DIAGNOSIS — E782 Mixed hyperlipidemia: Secondary | ICD-10-CM

## 2015-05-07 DIAGNOSIS — Z23 Encounter for immunization: Secondary | ICD-10-CM | POA: Diagnosis not present

## 2015-05-07 LAB — LIPID PANEL
CHOL/HDL RATIO: 6
Cholesterol: 292 mg/dL — ABNORMAL HIGH (ref 0–200)
HDL: 51.3 mg/dL (ref >39.00)
NONHDL: 240.57
TRIGLYCERIDES: 305 mg/dL — AB (ref 0.0–149.0)
VLDL: 61 mg/dL — ABNORMAL HIGH (ref 0.0–40.0)

## 2015-05-07 LAB — CK: Total CK: 78 U/L (ref 7–177)

## 2015-05-07 LAB — BASIC METABOLIC PANEL
BUN: 20 mg/dL (ref 6–23)
CHLORIDE: 103 meq/L (ref 96–112)
CO2: 28 meq/L (ref 19–32)
CREATININE: 1.12 mg/dL (ref 0.40–1.20)
Calcium: 10.2 mg/dL (ref 8.4–10.5)
GFR: 51.22 mL/min — ABNORMAL LOW (ref >60.00)
GLUCOSE: 97 mg/dL (ref 70–99)
POTASSIUM: 4.4 meq/L (ref 3.5–5.1)
Sodium: 141 mEq/L (ref 135–145)

## 2015-05-07 LAB — HEPATIC FUNCTION PANEL
ALT: 17 U/L (ref 0–35)
AST: 20 U/L (ref 0–37)
Albumin: 4.9 g/dL (ref 3.5–5.2)
Alkaline Phosphatase: 68 U/L (ref 39–117)
BILIRUBIN DIRECT: 0.1 mg/dL (ref 0.0–0.3)
BILIRUBIN TOTAL: 0.5 mg/dL (ref 0.2–1.2)
Total Protein: 8.3 g/dL (ref 6.0–8.3)

## 2015-05-07 LAB — LDL CHOLESTEROL, DIRECT: LDL DIRECT: 175 mg/dL

## 2015-05-07 LAB — TSH: TSH: 0.29 u[IU]/mL — ABNORMAL LOW (ref 0.35–4.50)

## 2015-05-07 MED ORDER — LEVOTHYROXINE SODIUM 100 MCG PO TABS: ORAL_TABLET | ORAL | Status: DC

## 2015-05-07 MED ORDER — ATORVASTATIN CALCIUM 10 MG PO TABS: 10.0000 mg | ORAL_TABLET | Freq: Every day | ORAL | Status: DC

## 2015-05-07 MED ORDER — AMLODIPINE BESYLATE 5 MG PO TABS: 5.0000 mg | ORAL_TABLET | Freq: Every day | ORAL | Status: DC

## 2015-05-07 MED ORDER — METOPROLOL SUCCINATE ER 50 MG PO TB24: 50.0000 mg | ORAL_TABLET | Freq: Every day | ORAL | Status: DC

## 2015-05-07 NOTE — Progress Notes (Signed)
   Subjective:    Patient ID: [name], female    DOB: [birth year], 69 y.o.   MRN: [id number]  HPI The patient is here to assess status of active health conditions.  PMH, FH, & Social History reviewed & updated.No change in [place] as recorded.  She is on a heart healthy diet. She is physically active caring for  5 grandchildren. She has no associated cardiopulmonary symptoms.  She has been compliant with her medicines without adverse effects.  Blood pressure  @ home ranges from 126/67-158/83.  Colonoscopy is up-to-date;she has no GI symptoms  She does have nocturia 2-3 times per night. She is followed by Dr. Alinda Money, Urologist. She had one kidney removed for a lesion which proved to be benign  She's had a total thyroidectomy; pathology did reveal micro-carcinoma.  Review of Systems Chest pain, palpitations, tachycardia, exertional dyspnea, paroxysmal nocturnal dyspnea, claudication or edema are absent. No unexplained weight loss, abdominal pain, significant dyspepsia, dysphagia, melena, rectal bleeding, or persistently small caliber stools. Dysuria, pyuria, hematuria, frequency,  or polyuria are denied. Change in hair, skin, nails denied. No bowel changes of constipation or diarrhea. No intolerance to heat or cold. She denies any significant memory issues. She also denies myalgias area. She's had a lesion over the left upper extremity for at least a year. She has seen Dr. Ronnald Ramp in the past and had lesions removed.     Objective:   Physical Exam  Pertinent or positive findings include: She appears younger than her stated age. Thyroid is absent surgically. She has mixed PIP/DIP arthritic changes in the hands, greater on the right. She has crepitus of the knees. Minimally erythematous lesion over the left upper extremity laterally measuring 9 x 5 mm.An actinic keratosis is suggested clinically.   General appearance :adequately nourished; in no distress.  Eyes: No conjunctival  inflammation or scleral icterus is present.  Oral exam:  Lips and gums are healthy appearing.There is no oropharyngeal erythema or exudate noted. Dental hygiene is good.  Heart:  Normal rate and regular rhythm. S1 and S2 normal without gallop, murmur, click, rub or other extra sounds    Lungs:Chest clear to auscultation; no wheezes, rhonchi,rales ,or rubs present.No increased work of breathing.   Abdomen: bowel sounds normal, soft and non-tender without masses, organomegaly or hernias noted.  No guarding or rebound..  Vascular : all pulses equal ; no bruits present.  Skin:Warm & dry.  Intact without suspicious lesions or rashes ; no tenting or jaundice   Lymphatic: No lymphadenopathy is noted about the head, neck, axilla  Neuro: Strength, tone & DTRs normal.     Assessment & Plan:  See Current Assessment & Plan in Problem List under specific Diagnosis

## 2015-05-07 NOTE — Assessment & Plan Note (Signed)
Blood pressure goals reviewed. BMET 

## 2015-05-07 NOTE — Patient Instructions (Addendum)
Minimal Blood Pressure Goal= AVERAGE < 140/90;  Ideal is an AVERAGE < 135/85. This AVERAGE should be calculated from @ least 5-7 BP readings taken @ different times of day on different days of week. You should not respond to isolated BP readings , but rather the AVERAGE for that week .Please bring your  blood pressure cuff to office visits to verify that it is reliable.It  can also be checked against the blood pressure device at the pharmacy. Finger or wrist cuffs are not dependable; an arm cuff is.   Your next office appointment will be determined based upon review of your pending labs .Those written interpretation of the lab results and instructions will be transmitted to you by My Chart. for your records. Critical results will be called. Followup as needed for any active or acute issue. Please report any significant change in your symptoms.  The Dermatology referral will be scheduled and you'll be notified of the time.Please call the Referral Co-Ordinator @ [phone] if you have not been notified of appointment time within 7-10 days.

## 2015-05-07 NOTE — Assessment & Plan Note (Addendum)
Lipids, LFTs, TSH  CK because of combined statin & fenofibrate therapy. She has no myalgias

## 2015-05-07 NOTE — Progress Notes (Signed)
Pre visit review using our clinic review tool, if applicable. No additional management support is needed unless otherwise documented below in the visit note. 

## 2015-05-07 NOTE — Assessment & Plan Note (Signed)
TSH 

## 2015-05-10 ENCOUNTER — Other Ambulatory Visit: Payer: Self-pay | Admitting: Internal Medicine

## 2015-05-10 DIAGNOSIS — E890 Postprocedural hypothyroidism: Secondary | ICD-10-CM

## 2015-05-10 DIAGNOSIS — E782 Mixed hyperlipidemia: Secondary | ICD-10-CM

## 2015-05-13 ENCOUNTER — Other Ambulatory Visit: Payer: Self-pay | Admitting: Internal Medicine

## 2015-05-14 ENCOUNTER — Other Ambulatory Visit: Payer: Self-pay | Admitting: Emergency Medicine

## 2015-05-14 MED ORDER — LORAZEPAM 1 MG PO TABS: ORAL_TABLET | ORAL | Status: DC

## 2015-05-20 ENCOUNTER — Telehealth: Payer: Self-pay | Admitting: *Deleted

## 2015-05-20 MED ORDER — ATORVASTATIN CALCIUM 20 MG PO TABS: 20.0000 mg | ORAL_TABLET | Freq: Every day | ORAL | Status: DC

## 2015-05-20 NOTE — Telephone Encounter (Signed)
Receive call pt states md increase her astorvastin to 20 mg needing a updated script sent to [facility]. Inform pt will send electronically to [facility].../lmb

## 2015-05-29 DIAGNOSIS — J449 Chronic obstructive pulmonary disease, unspecified: Secondary | ICD-10-CM | POA: Diagnosis not present

## 2015-06-29 DIAGNOSIS — J449 Chronic obstructive pulmonary disease, unspecified: Secondary | ICD-10-CM | POA: Diagnosis not present

## 2015-07-07 DIAGNOSIS — L814 Other melanin hyperpigmentation: Secondary | ICD-10-CM | POA: Diagnosis not present

## 2015-07-07 DIAGNOSIS — L570 Actinic keratosis: Secondary | ICD-10-CM | POA: Diagnosis not present

## 2015-07-07 DIAGNOSIS — L821 Other seborrheic keratosis: Secondary | ICD-10-CM | POA: Diagnosis not present

## 2015-07-07 DIAGNOSIS — C44619 Basal cell carcinoma of skin of left upper limb, including shoulder: Secondary | ICD-10-CM | POA: Diagnosis not present

## 2015-07-19 ENCOUNTER — Other Ambulatory Visit: Payer: Self-pay | Admitting: Internal Medicine

## 2015-07-20 NOTE — Telephone Encounter (Signed)
OK X 1 Back tomorrow

## 2015-07-20 NOTE — Telephone Encounter (Signed)
Called [facility]ill into [facility] had to leave on pharmacist vm,,,/lmb

## 2015-07-29 DIAGNOSIS — J449 Chronic obstructive pulmonary disease, unspecified: Secondary | ICD-10-CM | POA: Diagnosis not present

## 2015-08-06 ENCOUNTER — Other Ambulatory Visit: Payer: Self-pay | Admitting: Internal Medicine

## 2015-08-10 ENCOUNTER — Other Ambulatory Visit: Payer: Self-pay | Admitting: Internal Medicine

## 2015-08-25 ENCOUNTER — Other Ambulatory Visit: Payer: Self-pay | Admitting: Internal Medicine

## 2015-08-26 NOTE — Telephone Encounter (Signed)
MD out of office pls advise on refill.../lmb 

## 2015-08-26 NOTE — Telephone Encounter (Signed)
Faxed script back to [facility].../lmb 

## 2015-10-17 ENCOUNTER — Other Ambulatory Visit: Payer: Self-pay | Admitting: Internal Medicine

## 2015-10-18 NOTE — Telephone Encounter (Signed)
Left message advising patient to call back to schedule appt with new pcp----let tamara know when patient calls back so that ativan rx request can be routed to the new pcp to seek authorization

## 2015-10-18 NOTE — Telephone Encounter (Signed)
Patient has scheduled appt with you for 2/22----please advise, thanks

## 2015-10-20 ENCOUNTER — Encounter: Payer: Self-pay | Admitting: Family

## 2015-10-20 ENCOUNTER — Other Ambulatory Visit (INDEPENDENT_AMBULATORY_CARE_PROVIDER_SITE_OTHER): Payer: Medicare Other

## 2015-10-20 ENCOUNTER — Ambulatory Visit (INDEPENDENT_AMBULATORY_CARE_PROVIDER_SITE_OTHER): Payer: Medicare Other | Admitting: Family

## 2015-10-20 VITALS — BP 162/78 | HR 78 | Temp 97.8°F | Resp 14 | Ht 65.0 in | Wt 158.0 lb

## 2015-10-20 DIAGNOSIS — I10 Essential (primary) hypertension: Secondary | ICD-10-CM

## 2015-10-20 DIAGNOSIS — E890 Postprocedural hypothyroidism: Secondary | ICD-10-CM

## 2015-10-20 LAB — TSH: TSH: 0.72 u[IU]/mL (ref 0.35–4.50)

## 2015-10-20 MED ORDER — AMLODIPINE BESYLATE 10 MG PO TABS: 10.0000 mg | ORAL_TABLET | Freq: Every day | ORAL | Status: DC

## 2015-10-20 NOTE — Progress Notes (Signed)
Subjective:    Patient ID: [name], female    DOB: [birth year], 70 y.o.   MRN: [id number]  Chief Complaint  Patient presents with  . Hypertension    has been checking her bp daily on a automatic cuff, has been running top number in 160s, has felt a little lightheaded here and there    HPI:  [name] is a 70 y.o. female who  has a past medical history of Hyperlipidemia; Hypertension; Thyroid disease; Arthritis; Cataract; Heart murmur; and Adenomatous colon polyp. and presents today for a follow up office visit.  1.) Essential hypertension - Currently managed on amlodipine, benazepril and metoprolol. Reports taking the medication as prescribed and denies adverse side effects or hypotensive readings. Notes that her at home blood pressures have been elevated around the 0000000 systolic area indicating worsening of control.  Has felt some lightheadedness with no other symptoms end organ damage. Also expresses some fatigue.    BP Readings from Last 3 Encounters:  10/20/15 162/78  05/07/15 138/88  03/03/15 152/81    2.) Hypothyroidism - Currently maintained on levothyroxine. Reports taking the medication as prescribed and denies adverse side effects. Has noted increased fatigue lately.  Lab Results  Component Value Date   TSH 0.72 10/20/2015    Allergies  Allergen Reactions  . Codeine Sulfate     "made me jumpy"  . Erythromycin     nausea     Current Outpatient Prescriptions on File Prior to Visit  Medication Sig Dispense Refill  . acetaminophen (TYLENOL ARTHRITIS PAIN) 650 MG CR tablet Take 650 mg by mouth 4 (four) times daily as needed.     [name] aspirin 81 MG tablet Take 81 mg by mouth daily.      [name] atorvastatin (LIPITOR) 10 MG tablet TAKE 1 TABLET BY MOUTH EVERY DAY 90 tablet 1  . benazepril (LOTENSIN) 10 MG tablet TAKE 1 TABLET BY MOUTH EVERY DAY 90 tablet 1  . Calcium Carbonate-Vitamin D (CALTRATE 600+D PO) Take by mouth daily.      . fenofibrate micronized  (LOFIBRA) 134 MG capsule TAKE ONE CAPSULE BY MOUTH EVERY DAY 90 capsule 1  . levothyroxine (SYNTHROID, LEVOTHROID) 100 MCG tablet TAKE 1 TABLET BY MOUTH DAILY EXCEPT 1 & 1/2 TABLETS ON WEDNESDAYS 90 tablet 1  . LORazepam (ATIVAN) 1 MG tablet TAKE 1/2 TO 1 TABLET BY MOUTH EVERY 8 HOURS AS NEEDED 60 tablet 0  . metoprolol succinate (TOPROL-XL) 50 MG 24 hr tablet Take 1 tablet (50 mg total) by mouth daily. Take with or immediately following a meal. 90 tablet 1  . Multiple Vitamins-Minerals (CENTRUM PO) Take by mouth daily.      . NONFORMULARY OR COMPOUNDED ITEM Estradiol .02% 1 ML Prefilled Applicator Sig: apply vaginally twice a week #90 Day Supply with 4 refills 1 each 4  . potassium citrate (UROCIT-K 10) 10 MEQ (1080 MG) SR tablet Take 10 mEq by mouth daily.     . [DISCONTINUED] simvastatin (ZOCOR) 20 MG tablet Take 20 mg by mouth at bedtime.       No current facility-administered medications on file prior to visit.    Review of Systems  Respiratory: Negative for chest tightness and shortness of breath.   Cardiovascular: Negative for chest pain, palpitations and leg swelling.  Neurological: Positive for light-headedness. Negative for headaches.      Objective:    BP 162/78 mmHg  Pulse 78  Temp(Src) 97.8 F (36.6 C) (Oral)  Resp 14  Ht  5\' 5"  (1.651 m)  Wt 158 lb (71.668 kg)  BMI 26.29 kg/m2  SpO2 95% Nursing note and vital signs reviewed.  Physical Exam  Constitutional: She is oriented to person, place, and time. She appears well-developed and well-nourished. No distress.  Neck: No thyromegaly present.  Cardiovascular: Normal rate, regular rhythm, normal heart sounds and intact distal pulses.  Exam reveals no gallop and no friction rub.   No murmur heard. Pulmonary/Chest: Effort normal and breath sounds normal.  Lymphadenopathy:    She has no cervical adenopathy.  Neurological: She is alert and oriented to person, place, and time.  Skin: Skin is warm and dry.  Psychiatric:  She has a normal mood and affect. Her behavior is normal. Judgment and thought content normal.       Assessment & Plan:   Problem List Items Addressed This Visit      Cardiovascular and Mediastinum   Essential hypertension - Primary    Blood pressure is elevated about goal of 140/90 with current regimen. Continue current dosage of metoprolol and benazepril. Increase amlodipine.  Continue to monitor blood pressure at home. Follow up via MyChart in 2 weeks with blood pressure readings or sooner if needed.       Relevant Medications   amLODipine (NORVASC) 10 MG tablet     Endocrine   Hypothyroidism, postsurgical    Increased fatigue. Obtain TSH. Continue current dosage of levothyroxine pending TSH results.       Relevant Orders   TSH (Completed)

## 2015-10-20 NOTE — Patient Instructions (Signed)
Thank you for choosing [facility].  Summary/Instructions:  Please continue to take your medication as prescribed.  Increase your amlodipine to 10 mg daily.  Follow up via MyChart with blood pressure readings and medications covered for your cholesterol.  Please stop by the lab on the basement level of the building for your blood work. Your results will be released to [facility] (or called to you) after review, usually within 72 hours after test completion. If any changes need to be made, you will be notified at that same time.  If your symptoms worsen or fail to improve, please contact our office for further instruction, or in case of emergency go directly to the emergency room at the closest medical facility.

## 2015-10-20 NOTE — Progress Notes (Signed)
Pre visit review using our clinic review tool, if applicable. No additional management support is needed unless otherwise documented below in the visit note. 

## 2015-10-20 NOTE — Assessment & Plan Note (Signed)
Increased fatigue. Obtain TSH. Continue current dosage of levothyroxine pending TSH results.

## 2015-10-20 NOTE — Assessment & Plan Note (Signed)
Blood pressure is elevated about goal of 140/90 with current regimen. Continue current dosage of metoprolol and benazepril. Increase amlodipine.  Continue to monitor blood pressure at home. Follow up via MyChart in 2 weeks with blood pressure readings or sooner if needed.

## 2015-10-27 ENCOUNTER — Telehealth: Payer: Self-pay | Admitting: Internal Medicine

## 2015-10-27 NOTE — Telephone Encounter (Signed)
Pt dropped off her bp readings. She says she just isn't feeling well and wanted to know if there was any need for adjustments.  I put her readings in your basket up front

## 2015-10-29 ENCOUNTER — Telehealth: Payer: Self-pay | Admitting: Internal Medicine

## 2015-10-29 MED ORDER — BENAZEPRIL HCL 20 MG PO TABS: 20.0000 mg | ORAL_TABLET | Freq: Every day | ORAL | Status: DC

## 2015-10-29 NOTE — Telephone Encounter (Signed)
Increased dose of benazepril sent to pharmacy. Continue to monitor blood pressures at home. Follow up as scheduled on 3/29.

## 2015-10-29 NOTE — Telephone Encounter (Signed)
Pt called in about her BP meds, She has questions has to why it was increased?  Can you call her

## 2015-10-29 NOTE — Telephone Encounter (Signed)
Pt aware.

## 2015-10-31 ENCOUNTER — Other Ambulatory Visit: Payer: Self-pay | Admitting: Internal Medicine

## 2015-11-01 NOTE — Telephone Encounter (Signed)
LVM letting pt know that it was increased bc the readings that she gave [place] were still elevated.

## 2015-11-17 ENCOUNTER — Ambulatory Visit (INDEPENDENT_AMBULATORY_CARE_PROVIDER_SITE_OTHER): Payer: Medicare Other

## 2015-11-17 VITALS — BP 140/80 | Ht 66.0 in | Wt 160.0 lb

## 2015-11-17 DIAGNOSIS — Z0000 Encounter for general adult medical examination without abnormal findings: Secondary | ICD-10-CM

## 2015-11-17 DIAGNOSIS — Z23 Encounter for immunization: Secondary | ICD-10-CM

## 2015-11-17 NOTE — Patient Instructions (Addendum)
[name] , Thank you for taking time to come for your Medicare Wellness Visit. I appreciate your ongoing commitment to your health goals. Please review the following plan we discussed and let me know if I can assist you in the future.   Will have prevnar today  Will have eye exam this year   Remember to Dance   These are the goals we discussed: Goals    . patient     To take the time to think about future and make plans       This is a list of the screening recommended for you and due dates:  Health Maintenance  Topic Date Due  .  Hepatitis C: One time screening is recommended by Center for Disease Control  (CDC) for  adults born from [birth year] through 1965.   27-Dec-1945  . Pneumonia vaccines (2 of 2 - PCV13) 09/17/2014  . Flu Shot  03/28/2016  . Colon Cancer Screening  01/03/2017  . Mammogram  03/09/2017  . Tetanus Vaccine  02/06/2023  . DEXA scan (bone density measurement)  Completed  . Shingles Vaccine  Completed    DASH Eating Plan DASH stands for "Dietary Approaches to Stop Hypertension." The DASH eating plan is a healthy eating plan that has been shown to reduce high blood pressure (hypertension). Additional health benefits may include reducing the risk of type 2 diabetes mellitus, heart disease, and stroke. The DASH eating plan may also help with weight loss. WHAT DO I NEED TO KNOW ABOUT THE DASH EATING PLAN? For the DASH eating plan, you will follow these general guidelines:  Choose foods with a percent daily value for sodium of less than 5% (as listed on the food label).  Use salt-free seasonings or herbs instead of table salt or sea salt.  Check with your health care provider or pharmacist before using salt substitutes.  Eat lower-sodium products, often labeled as "lower sodium" or "no salt added."  Eat fresh foods.  Eat more vegetables, fruits, and low-fat dairy products.  Choose whole grains. Look for the word "whole" as the first word in the ingredient  list.  Choose fish and skinless chicken or [place] more often than red meat. Limit fish, poultry, and meat to 6 oz (170 g) each day.  Limit sweets, desserts, sugars, and sugary drinks.  Choose heart-healthy fats.  Limit cheese to 1 oz (28 g) per day.  Eat more home-cooked food and less restaurant, buffet, and fast food.  Limit fried foods.  Cook foods using methods other than frying.  Limit canned vegetables. If you do use them, rinse them well to decrease the sodium.  When eating at a restaurant, ask that your food be prepared with less salt, or no salt if possible. WHAT FOODS CAN I EAT? Seek help from a dietitian for individual calorie needs. Grains Whole grain or whole wheat bread. Brown rice. Whole grain or whole wheat pasta. Quinoa, bulgur, and whole grain cereals. Low-sodium cereals. Corn or whole wheat flour tortillas. Whole grain cornbread. Whole grain crackers. Low-sodium crackers. Vegetables Fresh or frozen vegetables (raw, steamed, roasted, or grilled). Low-sodium or reduced-sodium tomato and vegetable juices. Low-sodium or reduced-sodium tomato sauce and paste. Low-sodium or reduced-sodium canned vegetables.  Fruits All fresh, canned (in natural juice), or frozen fruits. Meat and Other Protein Products Ground beef (85% or leaner), grass-fed beef, or beef trimmed of fat. Skinless chicken or [place]. Ground chicken or [place]. Pork trimmed of fat. All fish and seafood. Eggs. Dried beans, peas,  or lentils. Unsalted nuts and seeds. Unsalted canned beans. Dairy Low-fat dairy products, such as skim or 1% milk, 2% or reduced-fat cheeses, low-fat ricotta or cottage cheese, or plain low-fat yogurt. Low-sodium or reduced-sodium cheeses. Fats and Oils Tub margarines without trans fats. Light or reduced-fat mayonnaise and salad dressings (reduced sodium). Avocado. Safflower, olive, or canola oils. Natural peanut or almond butter. Other Unsalted popcorn and pretzels. The items listed  above may not be a complete list of recommended foods or beverages. Contact your dietitian for more options. WHAT FOODS ARE NOT RECOMMENDED? Grains White bread. White pasta. White rice. Refined cornbread. Bagels and croissants. Crackers that contain trans fat. Vegetables Creamed or fried vegetables. Vegetables in a cheese sauce. Regular canned vegetables. Regular canned tomato sauce and paste. Regular tomato and vegetable juices. Fruits Dried fruits. Canned fruit in light or heavy syrup. Fruit juice. Meat and Other Protein Products Fatty cuts of meat. Ribs, chicken wings, bacon, sausage, bologna, salami, chitterlings, fatback, hot dogs, bratwurst, and packaged luncheon meats. Salted nuts and seeds. Canned beans with salt. Dairy Whole or 2% milk, cream, half-and-half, and cream cheese. Whole-fat or sweetened yogurt. Full-fat cheeses or blue cheese. Nondairy creamers and whipped toppings. Processed cheese, cheese spreads, or cheese curds. Condiments Onion and garlic salt, seasoned salt, table salt, and sea salt. Canned and packaged gravies. Worcestershire sauce. Tartar sauce. Barbecue sauce. Teriyaki sauce. Soy sauce, including reduced sodium. Steak sauce. Fish sauce. Oyster sauce. Cocktail sauce. Horseradish. Ketchup and mustard. Meat flavorings and tenderizers. Bouillon cubes. Hot sauce. Tabasco sauce. Marinades. Taco seasonings. Relishes. Fats and Oils Butter, stick margarine, lard, shortening, ghee, and bacon fat. Coconut, palm kernel, or palm oils. Regular salad dressings. Other Pickles and olives. Salted popcorn and pretzels. The items listed above may not be a complete list of foods and beverages to avoid. Contact your dietitian for more information. WHERE CAN I FIND MORE INFORMATION? National Heart, Lung, and Blood Institute: travelstabloid.com   This information is not intended to replace advice given to you by your health care provider. Make sure you  discuss any questions you have with your health care provider.   Document Released: 08/03/2011 Document Revised: 09/04/2014 Document Reviewed: 06/18/2013 Elsevier Interactive Patient Education 2016 [employer]  Fat and Cholesterol Restricted Diet Getting too much fat and cholesterol in your diet may cause health problems. Following this diet helps keep your fat and cholesterol at normal levels. This can keep you from getting sick. WHAT TYPES OF FAT SHOULD I CHOOSE?  Choose monosaturated and polyunsaturated fats. These are found in foods such as olive oil, canola oil, flaxseeds, walnuts, almonds, and seeds.  Eat more omega-3 fats. Good choices include salmon, mackerel, sardines, tuna, flaxseed oil, and ground flaxseeds.  Limit saturated fats. These are in animal products such as meats, butter, and cream. They can also be in plant products such as palm oil, palm kernel oil, and coconut oil.   Avoid foods with partially hydrogenated oils in them. These contain trans fats. Examples of foods that have trans fats are stick margarine, some tub margarines, cookies, crackers, and other baked goods. WHAT GENERAL GUIDELINES DO I NEED TO FOLLOW?   Check food labels. Look for the words "trans fat" and "saturated fat."  When preparing a meal:  Fill half of your plate with vegetables and green salads.  Fill one fourth of your plate with whole grains. Look for the word "whole" as the first word in the ingredient list.  Fill one fourth of your plate with  lean protein foods.  Limit fruit to two servings a day. Choose fruit instead of juice.  Eat more foods with soluble fiber. Examples of foods with this type of fiber are apples, broccoli, carrots, beans, peas, and barley. Try to get 20-30 g (grams) of fiber per day.  Eat more home-cooked foods. Eat less at restaurants and buffets.  Limit or avoid alcohol.  Limit foods high in starch and sugar.  Limit fried foods.  Cook foods without frying  them. Baking, boiling, grilling, and broiling are all great options.  Lose weight if you are overweight. Losing even a small amount of weight can help your overall health. It can also help prevent diseases such as diabetes and heart disease. WHAT FOODS CAN I EAT? Grains Whole grains, such as whole wheat or whole grain breads, crackers, cereals, and pasta. Unsweetened oatmeal, bulgur, barley, quinoa, or brown rice. Corn or whole wheat flour tortillas. Vegetables Fresh or frozen vegetables (raw, steamed, roasted, or grilled). Green salads. Fruits All fresh, canned (in natural juice), or frozen fruits. Meat and Other Protein Products Ground beef (85% or leaner), grass-fed beef, or beef trimmed of fat. Skinless chicken or [place]. Ground chicken or [place]. Pork trimmed of fat. All fish and seafood. Eggs. Dried beans, peas, or lentils. Unsalted nuts or seeds. Unsalted canned or dry beans. Dairy Low-fat dairy products, such as skim or 1% milk, 2% or reduced-fat cheeses, low-fat ricotta or cottage cheese, or plain low-fat yogurt. Fats and Oils Tub margarines without trans fats. Light or reduced-fat mayonnaise and salad dressings. Avocado. Olive, canola, sesame, or safflower oils. Natural peanut or almond butter (choose ones without added sugar and oil). The items listed above may not be a complete list of recommended foods or beverages. Contact your dietitian for more options. WHAT FOODS ARE NOT RECOMMENDED? Grains White bread. White pasta. White rice. Cornbread. Bagels, pastries, and croissants. Crackers that contain trans fat. Vegetables White potatoes. Corn. Creamed or fried vegetables. Vegetables in a cheese sauce. Fruits Dried fruits. Canned fruit in light or heavy syrup. Fruit juice. Meat and Other Protein Products Fatty cuts of meat. Ribs, chicken wings, bacon, sausage, bologna, salami, chitterlings, fatback, hot dogs, bratwurst, and packaged luncheon meats. Liver and organ  meats. Dairy Whole or 2% milk, cream, half-and-half, and cream cheese. Whole milk cheeses. Whole-fat or sweetened yogurt. Full-fat cheeses. Nondairy creamers and whipped toppings. Processed cheese, cheese spreads, or cheese curds. Sweets and Desserts Corn syrup, sugars, honey, and molasses. Candy. Jam and jelly. Syrup. Sweetened cereals. Cookies, pies, cakes, donuts, muffins, and ice cream. Fats and Oils Butter, stick margarine, lard, shortening, ghee, or bacon fat. Coconut, palm kernel, or palm oils. Beverages Alcohol. Sweetened drinks (such as sodas, lemonade, and fruit drinks or punches). The items listed above may not be a complete list of foods and beverages to avoid. Contact your dietitian for more information.   This information is not intended to replace advice given to you by your health care provider. Make sure you discuss any questions you have with your health care provider.   Document Released: 02/13/2012 Document Revised: 09/04/2014 Document Reviewed: 11/13/2013 Elsevier Interactive Patient Education 2016 [place] in the Home  Falls can cause injuries. They can happen to people of all ages. There are many things you can do to make your home safe and to help prevent falls.  WHAT CAN I DO ON THE OUTSIDE OF MY HOME?  Regularly fix the edges of walkways and driveways and fix any cracks.  Remove anything that might make you trip as you walk through a door, such as a raised step or threshold.  Trim any bushes or trees on the path to your home.  Use bright outdoor lighting.  Clear any walking paths of anything that might make someone trip, such as rocks or tools.  Regularly check to see if handrails are loose or broken. Make sure that both sides of any steps have handrails.  Any raised decks and porches should have guardrails on the edges.  Have any leaves, snow, or ice cleared regularly.  Use sand or salt on walking paths during winter.  Clean up  any spills in your garage right away. This includes oil or grease spills. WHAT CAN I DO IN THE BATHROOM?   Use night lights.  Install grab bars by the toilet and in the tub and shower. Do not use towel bars as grab bars.  Use non-skid mats or decals in the tub or shower.  If you need to sit down in the shower, use a plastic, non-slip stool.  Keep the floor dry. Clean up any water that spills on the floor as soon as it happens.  Remove soap buildup in the tub or shower regularly.  Attach bath mats securely with double-sided non-slip rug tape.  Do not have throw rugs and other things on the floor that can make you trip. WHAT CAN I DO IN THE BEDROOM?  Use night lights.  Make sure that you have a light by your bed that is easy to reach.  Do not use any sheets or blankets that are too big for your bed. They should not hang down onto the floor.  Have a firm chair that has side arms. You can use this for support while you get dressed.  Do not have throw rugs and other things on the floor that can make you trip. WHAT CAN I DO IN THE KITCHEN?  Clean up any spills right away.  Avoid walking on wet floors.  Keep items that you use a lot in easy-to-reach places.  If you need to reach something above you, use a strong step stool that has a grab bar.  Keep electrical cords out of the way.  Do not use floor polish or wax that makes floors slippery. If you must use wax, use non-skid floor wax.  Do not have throw rugs and other things on the floor that can make you trip. WHAT CAN I DO WITH MY STAIRS?  Do not leave any items on the stairs.  Make sure that there are handrails on both sides of the stairs and use them. Fix handrails that are broken or loose. Make sure that handrails are as long as the stairways.  Check any carpeting to make sure that it is firmly attached to the stairs. Fix any carpet that is loose or worn.  Avoid having throw rugs at the top or bottom of the stairs.  If you do have throw rugs, attach them to the floor with carpet tape.  Make sure that you have a light switch at the top of the stairs and the bottom of the stairs. If you do not have them, ask someone to add them for you. WHAT ELSE CAN I DO TO HELP PREVENT FALLS?  Wear shoes that:  Do not have high heels.  Have rubber bottoms.  Are comfortable and fit you well.  Are closed at the toe. Do not wear sandals.  If you use a stepladder:  Make sure that it is fully opened. Do not climb a closed stepladder.  Make sure that both sides of the stepladder are locked into place.  Ask someone to hold it for you, if possible.  Clearly mark and make sure that you can see:  Any grab bars or handrails.  First and last steps.  Where the edge of each step is.  Use tools that help you move around (mobility aids) if they are needed. These include:  Canes.  Walkers.  Scooters.  Crutches.  Turn on the lights when you go into a dark area. Replace any light bulbs as soon as they burn out.  Set up your furniture so you have a clear path. Avoid moving your furniture around.  If any of your floors are uneven, fix them.  If there are any pets around you, be aware of where they are.  Review your medicines with your doctor. Some medicines can make you feel dizzy. This can increase your chance of falling. Ask your doctor what other things that you can do to help prevent falls.   This information is not intended to replace advice given to you by your health care provider. Make sure you discuss any questions you have with your health care provider.   Document Released: 06/10/2009 Document Revised: 12/29/2014 Document Reviewed: 09/18/2014 Elsevier Interactive Patient Education 2016 [place] Maintenance, Female Adopting a healthy lifestyle and getting preventive care can go a long way to promote health and wellness. Talk with your health care provider about what schedule of  regular examinations is right for you. This is a good chance for you to check in with your provider about disease prevention and staying healthy. In between checkups, there are plenty of things you can do on your own. Experts have done a lot of research about which lifestyle changes and preventive measures are most likely to keep you healthy. Ask your health care provider for more information. WEIGHT AND DIET  Eat a healthy diet  Be sure to include plenty of vegetables, fruits, low-fat dairy products, and lean protein.  Do not eat a lot of foods high in solid fats, added sugars, or salt.  Get regular exercise. This is one of the most important things you can do for your health.  Most adults should exercise for at least 150 minutes each week. The exercise should increase your heart rate and make you sweat (moderate-intensity exercise).  Most adults should also do strengthening exercises at least twice a week. This is in addition to the moderate-intensity exercise.  Maintain a healthy weight  Body mass index (BMI) is a measurement that can be used to identify possible weight problems. It estimates body fat based on height and weight. Your health care provider can help determine your BMI and help you achieve or maintain a healthy weight.  For females 60 years of age and older:   A BMI below 18.5 is considered underweight.  A BMI of 18.5 to 24.9 is normal.  A BMI of 25 to 29.9 is considered overweight.  A BMI of 30 and above is considered obese.  Watch levels of cholesterol and blood lipids  You should start having your blood tested for lipids and cholesterol at 70 years of age, then have this test every 5 years.  You may need to have your cholesterol levels checked more often if:  Your lipid or cholesterol levels are high.  You are older than 70 years of age.  You are  at high risk for heart disease.  CANCER SCREENING   Lung Cancer  Lung cancer screening is recommended for  adults 21-71 years old who are at high risk for lung cancer because of a history of smoking.  A yearly low-dose CT scan of the lungs is recommended for people who:  Currently smoke.  Have quit within the past 15 years.  Have at least a 30-pack-year history of smoking. A pack year is smoking an average of one pack of cigarettes a day for 1 year.  Yearly screening should continue until it has been 15 years since you quit.  Yearly screening should stop if you develop a health problem that would prevent you from having lung cancer treatment.  Breast Cancer  Practice breast self-awareness. This means understanding how your breasts normally appear and feel.  It also means doing regular breast self-exams. Let your health care provider know about any changes, no matter how small.  If you are in your 20s or 30s, you should have a clinical breast exam (CBE) by a health care provider every 1-3 years as part of a regular health exam.  If you are 4 or older, have a CBE every year. Also consider having a breast X-ray (mammogram) every year.  If you have a family history of breast cancer, talk to your health care provider about genetic screening.  If you are at high risk for breast cancer, talk to your health care provider about having an MRI and a mammogram every year.  Breast cancer gene (BRCA) assessment is recommended for women who have family members with BRCA-related cancers. BRCA-related cancers include:  Breast.  Ovarian.  Tubal.  Peritoneal cancers.  Results of the assessment will determine the need for genetic counseling and BRCA1 and BRCA2 testing. Cervical Cancer Your health care provider may recommend that you be screened regularly for cancer of the pelvic organs (ovaries, uterus, and vagina). This screening involves a pelvic examination, including checking for microscopic changes to the surface of your cervix (Pap test). You may be encouraged to have this screening done every  3 years, beginning at age 53.  For women ages 63-65, health care providers may recommend pelvic exams and Pap testing every 3 years, or they may recommend the Pap and pelvic exam, combined with testing for human papilloma virus (HPV), every 5 years. Some types of HPV increase your risk of cervical cancer. Testing for HPV may also be done on women of any age with unclear Pap test results.  Other health care providers may not recommend any screening for nonpregnant women who are considered low risk for pelvic cancer and who do not have symptoms. Ask your health care provider if a screening pelvic exam is right for you.  If you have had past treatment for cervical cancer or a condition that could lead to cancer, you need Pap tests and screening for cancer for at least 20 years after your treatment. If Pap tests have been discontinued, your risk factors (such as having a new sexual partner) need to be reassessed to determine if screening should resume. Some women have medical problems that increase the chance of getting cervical cancer. In these cases, your health care provider may recommend more frequent screening and Pap tests. Colorectal Cancer  This type of cancer can be detected and often prevented.  Routine colorectal cancer screening usually begins at 70 years of age and continues through 70 years of age.  Your health care provider may recommend screening at an  earlier age if you have risk factors for colon cancer.  Your health care provider may also recommend using home test kits to check for hidden blood in the stool.  A small camera at the end of a tube can be used to examine your colon directly (sigmoidoscopy or colonoscopy). This is done to check for the earliest forms of colorectal cancer.  Routine screening usually begins at age 39.  Direct examination of the colon should be repeated every 5-10 years through 70 years of age. However, you may need to be screened more often if early  forms of precancerous polyps or small growths are found. Skin Cancer  Check your skin from head to toe regularly.  Tell your health care provider about any new moles or changes in moles, especially if there is a change in a mole's shape or color.  Also tell your health care provider if you have a mole that is larger than the size of a pencil eraser.  Always use sunscreen. Apply sunscreen liberally and repeatedly throughout the day.  Protect yourself by wearing long sleeves, pants, a wide-brimmed hat, and sunglasses whenever you are outside. HEART DISEASE, DIABETES, AND HIGH BLOOD PRESSURE   High blood pressure causes heart disease and increases the risk of stroke. High blood pressure is more likely to develop in:  People who have blood pressure in the high end of the normal range (130-139/85-89 mm Hg).  People who are overweight or obese.  People who are African American.  If you are 59-11 years of age, have your blood pressure checked every 3-5 years. If you are 38 years of age or older, have your blood pressure checked every year. You should have your blood pressure measured twice--once when you are at a hospital or clinic, and once when you are not at a hospital or clinic. Record the average of the two measurements. To check your blood pressure when you are not at a hospital or clinic, you can use:  An automated blood pressure machine at a pharmacy.  A home blood pressure monitor.  If you are between 33 years and 68 years old, ask your health care provider if you should take aspirin to prevent strokes.  Have regular diabetes screenings. This involves taking a blood sample to check your fasting blood sugar level.  If you are at a normal weight and have a low risk for diabetes, have this test once every three years after 70 years of age.  If you are overweight and have a high risk for diabetes, consider being tested at a younger age or more often. PREVENTING INFECTION  Hepatitis  B  If you have a higher risk for hepatitis B, you should be screened for this virus. You are considered at high risk for hepatitis B if:  You were born in a country where hepatitis B is common. Ask your health care provider which countries are considered high risk.  Your parents were born in a high-risk country, and you have not been immunized against hepatitis B (hepatitis B vaccine).  You have HIV or AIDS.  You use needles to inject street drugs.  You live with someone who has hepatitis B.  You have had sex with someone who has hepatitis B.  You get hemodialysis treatment.  You take certain medicines for conditions, including cancer, organ transplantation, and autoimmune conditions. Hepatitis C  Blood testing is recommended for:  Everyone born from [birth year] through 1965.  Anyone with known risk factors for hepatitis C.  Sexually transmitted infections (STIs)  You should be screened for sexually transmitted infections (STIs) including gonorrhea and chlamydia if:  You are sexually active and are younger than 70 years of age.  You are older than 70 years of age and your health care provider tells you that you are at risk for this type of infection.  Your sexual activity has changed since you were last screened and you are at an increased risk for chlamydia or gonorrhea. Ask your health care provider if you are at risk.  If you do not have HIV, but are at risk, it may be recommended that you take a prescription medicine daily to prevent HIV infection. This is called pre-exposure prophylaxis (PrEP). You are considered at risk if:  You are sexually active and do not regularly use condoms or know the HIV status of your partner(s).  You take drugs by injection.  You are sexually active with a partner who has HIV. Talk with your health care provider about whether you are at high risk of being infected with HIV. If you choose to begin PrEP, you should first be tested for HIV. You should  then be tested every 3 months for as long as you are taking PrEP.  PREGNANCY   If you are premenopausal and you may become pregnant, ask your health care provider about preconception counseling.  If you may become pregnant, take 400 to 800 micrograms (mcg) of folic acid every day.  If you want to prevent pregnancy, talk to your health care provider about birth control (contraception). OSTEOPOROSIS AND MENOPAUSE   Osteoporosis is a disease in which the bones lose minerals and strength with aging. This can result in serious bone fractures. Your risk for osteoporosis can be identified using a bone density scan.  If you are 42 years of age or older, or if you are at risk for osteoporosis and fractures, ask your health care provider if you should be screened.  Ask your health care provider whether you should take a calcium or vitamin D supplement to lower your risk for osteoporosis.  Menopause may have certain physical symptoms and risks.  Hormone replacement therapy may reduce some of these symptoms and risks. Talk to your health care provider about whether hormone replacement therapy is right for you.  HOME CARE INSTRUCTIONS   Schedule regular health, dental, and eye exams.  Stay current with your immunizations.   Do not use any tobacco products including cigarettes, chewing tobacco, or electronic cigarettes.  If you are pregnant, do not drink alcohol.  If you are breastfeeding, limit how much and how often you drink alcohol.  Limit alcohol intake to no more than 1 drink per day for nonpregnant women. One drink equals 12 ounces of beer, 5 ounces of wine, or 1 ounces of hard liquor.  Do not use street drugs.  Do not share needles.  Ask your health care provider for help if you need support or information about quitting drugs.  Tell your health care provider if you often feel depressed.  Tell your health care provider if you have ever been abused or do not feel safe at home.    This information is not intended to replace advice given to you by your health care provider. Make sure you discuss any questions you have with your health care provider.   Document Released: 02/27/2011 Document Revised: 09/04/2014 Document Reviewed: 07/16/2013 Elsevier Interactive Patient Education [employer].

## 2015-11-17 NOTE — Progress Notes (Addendum)
Subjective:   [name] is a 70 y.o. female who presents for Medicare Annual (Subsequent) preventive examination.  Review of Systems:  HRA assessment completed during visit; [name], [place]  The Patient was informed that this wellness visit is to identify risk and educate on how to reduce risk for increase disease through lifestyle changes.   ROS deferred to CPE exam with physician   Medical and family hx Father had TIA; CAD; Nephrolithiasis Mother had Hyperlipidemia; TIA; Thyroid disease; Sister Urolithiasis  Tobacco use minimal ETOH; minimal  Medical issues  HTN/ c/o about bp; bottom is up some; inc'd medicine; Apt to question medication and "starting over" as BP seems to no longer respond, however, BP today was 140/80.   Gained weight after Christmas; last couple of months; (5lbs)  States her head feels funny when BP up; no other concrete symptom.  Ostepenia/ Bone loss; gyn follows; Dr. Toney Rakes / not sure this was fully addressed in note Hyperlipidemia 04/2015; chol 292; Trig 305; HDL 51; LDL 175 (A1c 5.8)   Spouse still works;   BMI: 25  Diet; is eractic due to care giving and commitments  Breakfast; Peanut butter crackers;  Dinner had small steak; spinach and red potatoes  STRESS:  still work 2 days a week; [place] which she enjoys (M,T)  5 grandchildren; Baby sits W;Th; F/ Keeps mother every other weekend; has caregiver through [place] full time but they alternate weekends;  Also mentions son may be "moving in" with 2 grand-children.  Father died a couple of years ago,  but now settling estate; Discussed BP does tend to elevate under stress and may be beneficial to set limits to her ongoing care giving roles; Given [employer] health as needed for ongoing concerns regarding family members  Exercise; If she had time; she likes to dance [place];  W,Th And Friday with grands Ages 3, 2, 25, 7  Very busy schedule; Explored carving in time for  herself;    Barrier to ongoing health is time and varied schedule   SAFETY; spouse is able bodied /lives in multi-level home Falls in the last year NONE Stairs are exercise for her at this time Safety reviewed for the home;  Bathroom safety; shower in the tub; separate in tub Community safety; yes Smoke detectors yes Firearms safety / keep in safe place; spouse does hunt Driving accidents and seatbelt/ no Sun protection/yes  Long term plans; [place]   Medication review/ no issues  Fall assessment /no  Gait assessment/ appears normal  Mobilization and Functional losses in the last year/ none Sleep patterns/ sleeps ok; goes to be bed late   Urinary or fecal incontinence reviewed/ no bowel or bladder issues   Counseling: Hep C;  Colonoscopy; 12/2011 repeat in 5 years; 12/2016 EKG: 10/2011 Mammogram 02/2015 no evidence of malignancy  Dexa 02/2015 L femur; -1.5; left forearm -2.4; not recommending treatment; To discuss with Marya Amsler C as I did not see where this was addressed in notes)  Only has one kidney; has a kidney stone in the middle of kidney; Dr. Renee Ramus following Left kidney out due to carcinoma;  (discussed calcium po for osteopenia; but will discuss with Marya Amsler C. in lieu of kidney stone present in remaining kidney; ur trying K)  PAP 09/2013 Hearing: no issues  Ophthalmology exam; about May/ annually  Immunizations: Prevnar which was given today  Advanced Directive; thinks she has;  Health advice or referrals   Current Care Team  reviewed and updated   Cardiac Risk Factors include: advanced age (>37men, >3 women);dyslipidemia;hypertension     Objective:     Vitals: BP 140/80 mmHg  Ht 5\' 6"  (1.676 m)  Wt 160 lb (72.576 kg)  BMI 25.84 kg/m2  Body mass index is 25.84 kg/(m^2).   Tobacco History  Smoking status  . Former Smoker  . Quit date: 08/29/1975  Smokeless tobacco  . Never Used    Comment: smoked 1973-1977, up to 1/2 ppd       Counseling given: Not Answered   Past Medical History  Diagnosis Date  . Hyperlipidemia   . Hypertension   . Thyroid disease   . Arthritis   . Cataract   . Heart murmur     MVP  . Adenomatous colon polyp    Past Surgical History  Procedure Laterality Date  . Total abdominal hysterectomy w/ bilateral salpingoophorectomy  2003    [name] for dysfunctional menses  . Thyroidectomy  2000    [name]  . Tonsillectomy    . Nephrectomy  2010    Left for 9.8cm benign complex cyst  . Dilation and curettage of uterus      X2  . Colonoscopy w/ polypectomy  2003,2006,2013    [name]   Family History  Problem Relation Age of Onset  . Transient ischemic attack Father     in 2s  . Coronary artery disease Father     stents @ 31  . Nephrolithiasis Father   . Hyperlipidemia Mother   . Transient ischemic attack Mother 42  . Thyroid disease Mother     hyperthyroid  . Urolithiasis Sister   . Diabetes Neg Hx    History  Sexual Activity  . Sexual Activity: Yes    Comment: HYST    Outpatient Encounter Prescriptions as of 11/17/2015  Medication Sig  . acetaminophen (TYLENOL ARTHRITIS PAIN) 650 MG CR tablet Take 650 mg by mouth 4 (four) times daily as needed.   [name] amLODipine (NORVASC) 10 MG tablet Take 1 tablet (10 mg total) by mouth daily.  [name] aspirin 81 MG tablet Take 81 mg by mouth daily.    [name] atorvastatin (LIPITOR) 10 MG tablet TAKE 1 TABLET BY MOUTH EVERY DAY  . benazepril (LOTENSIN) 20 MG tablet Take 1 tablet (20 mg total) by mouth daily.  . Calcium Carbonate-Vitamin D (CALTRATE 600+D PO) Take by mouth daily.    . fenofibrate micronized (LOFIBRA) 134 MG capsule TAKE ONE CAPSULE BY MOUTH EVERY DAY  . levothyroxine (SYNTHROID, LEVOTHROID) 100 MCG tablet TAKE 1 TABLET BY MOUTH DAILY EXCEPT 1 & 1/2 TABLETS ON WEDNESDAYS  . LORazepam (ATIVAN) 1 MG tablet TAKE 1/2 TO 1 TABLET BY MOUTH EVERY 8 HOURS AS NEEDED  . metoprolol succinate (TOPROL-XL) 50 MG 24 hr tablet Take 1 tablet  (50 mg total) by mouth daily. Take with or immediately following a meal.  . Multiple Vitamins-Minerals (CENTRUM PO) Take by mouth daily.    . NONFORMULARY OR COMPOUNDED ITEM Estradiol .02% 1 ML Prefilled Applicator Sig: apply vaginally twice a week #90 Day Supply with 4 refills  . potassium citrate (UROCIT-K 10) 10 MEQ (1080 MG) SR tablet Take 10 mEq by mouth daily.   [name] amLODipine (NORVASC) 5 MG tablet TAKE 1 TABLET BY MOUTH EVERY DAY (Patient not taking: Reported on 11/17/2015)   No facility-administered encounter medications on file as of 11/17/2015.    Activities of Daily Living In your present state of health, do you have any difficulty performing  the following activities: 11/17/2015  Hearing? N  Vision? N  Difficulty concentrating or making decisions? N  Walking or climbing stairs? N  Dressing or bathing? N  Doing errands, shopping? N  Preparing Food and eating ? N  Using the Toilet? N  In the past six months, have you accidently leaked urine? N  Do you have problems with loss of bowel control? N  Managing your Medications? N  Managing your Finances? N  Housekeeping or managing your Housekeeping? N    Patient Care Team: Hendricks Limes, MD as PCP - General    Assessment:     Exercise Activities and Dietary recommendations Current Exercise Habits: Home exercise routine, Type of exercise: walking, Intensity: Moderate  Goals    . patient     To take the time to think about future and make plans      Fall Risk Fall Risk  11/17/2015 10/16/2014 09/17/2013  Falls in the past year? No No No   Depression Screen PHQ 2/9 Scores 11/17/2015 10/16/2014 09/17/2013  PHQ - 2 Score 0 0 0     Cognitive Testing No flowsheet data found.  Ad8 score 0   Immunization History  Administered Date(s) Administered  . Influenza Split 06/21/2011, 06/12/2012  . Influenza Whole 07/11/2007, 06/10/2008, 06/29/2010  . Influenza, High Dose Seasonal PF 06/18/2013  . Influenza,inj,Quad PF,36+ Mos  06/10/2014, 05/07/2015  . Pneumococcal Conjugate-13 11/17/2015  . Pneumococcal Polysaccharide-23 09/17/2013  . Td 01/05/2003  . Tdap 02/05/2013  . Zoster 11/10/2011   Screening Tests Health Maintenance  Topic Date Due  . Hepatitis C Screening  [birth year]  . PNA vac Low Risk Adult (2 of 2 - PCV13) 09/17/2014  . INFLUENZA VACCINE  03/28/2016  . COLONOSCOPY  01/03/2017  . MAMMOGRAM  03/09/2017  . TETANUS/TDAP  02/06/2023  . DEXA SCAN  Completed  . ZOSTAVAX  Completed      Plan:   Will have prevnar today  Will have eye exam this year   May have difficulty controlling BP in lieu of ongoing stresses as documented  To review for Dexa recommendations /  During the course of the visit the patient was educated and counseled about the following appropriate screening and preventive services:   Vaccines to include Pneumoccal, Influenza, Hepatitis B, Td, Zostavax, HCV  Took prevnar today  Electrocardiogram/10/2011  Cardiovascular Disease/ High BP; gained weight since Christmas/ stress  Colorectal cancer screening/ 12/2016  Bone density screening/ osteopenic; to discuss Calcium; Discussed weight bearing exericise  Diabetes screening/neg fasting  Glaucoma screening/ annual eye exams; due may  Mammography/02/2016  Nutrition counseling started; may need to reduce stress prior to being effective/ BMI 25.  Patient Instructions (the written plan) was given to the patient.   [id number], RN  11/17/2015   .Medical screening examination/treatment/procedure(s) were performed by non-physician practitioner and as supervising provider I was immediately available for consultation/collaboration. I agree with above. Mauricio Po, FNP

## 2015-11-24 ENCOUNTER — Ambulatory Visit (INDEPENDENT_AMBULATORY_CARE_PROVIDER_SITE_OTHER): Payer: Medicare Other | Admitting: Family

## 2015-11-24 ENCOUNTER — Encounter: Payer: Self-pay | Admitting: Family

## 2015-11-24 ENCOUNTER — Other Ambulatory Visit (INDEPENDENT_AMBULATORY_CARE_PROVIDER_SITE_OTHER): Payer: Medicare Other

## 2015-11-24 VITALS — BP 162/82 | HR 95 | Temp 97.7°F | Ht 66.0 in | Wt 159.0 lb

## 2015-11-24 DIAGNOSIS — I10 Essential (primary) hypertension: Secondary | ICD-10-CM

## 2015-11-24 DIAGNOSIS — E782 Mixed hyperlipidemia: Secondary | ICD-10-CM

## 2015-11-24 LAB — LIPID PANEL
CHOLESTEROL: 320 mg/dL — AB (ref 0–200)
HDL: 48.2 mg/dL (ref >39.00)
NonHDL: 271.79
Total CHOL/HDL Ratio: 7
Triglycerides: 264 mg/dL — ABNORMAL HIGH (ref 0.0–149.0)
VLDL: 52.8 mg/dL — AB (ref 0.0–40.0)

## 2015-11-24 LAB — BASIC METABOLIC PANEL
BUN: 22 mg/dL (ref 6–23)
CALCIUM: 9.7 mg/dL (ref 8.4–10.5)
CO2: 28 mEq/L (ref 19–32)
CREATININE: 1.11 mg/dL (ref 0.40–1.20)
Chloride: 106 mEq/L (ref 96–112)
GFR: 51.67 mL/min — AB (ref >60.00)
GLUCOSE: 101 mg/dL — AB (ref 70–99)
Potassium: 4.2 mEq/L (ref 3.5–5.1)
Sodium: 141 mEq/L (ref 135–145)

## 2015-11-24 LAB — LDL CHOLESTEROL, DIRECT: LDL DIRECT: 151 mg/dL

## 2015-11-24 MED ORDER — BENAZEPRIL HCL 20 MG PO TABS: 30.0000 mg | ORAL_TABLET | Freq: Every day | ORAL | Status: DC

## 2015-11-24 MED ORDER — FENOFIBRATE 160 MG PO TABS: 160.0000 mg | ORAL_TABLET | Freq: Every day | ORAL | Status: DC

## 2015-11-24 NOTE — Patient Instructions (Signed)
Thank you for choosing [facility].  Summary/Instructions:  Please continue to take your blood pressure at home.   Increase your benazepril to 30 mg (1.5 tablets) daily.   Decrease your amlodipine to 5 mg (1 tablet) daily.  Your prescription(s) have been submitted to your pharmacy or been printed and provided for you. Please take as directed and contact our office if you believe you are having problem(s) with the medication(s) or have any questions.  Please stop by the lab on the basement level of the building for your blood work. Your results will be released to [facility] (or called to you) after review, usually within 72 hours after test completion. If any changes need to be made, you will be notified at that same time.  If your symptoms worsen or fail to improve, please contact our office for further instruction, or in case of emergency go directly to the emergency room at the closest medical facility.

## 2015-11-24 NOTE — Progress Notes (Signed)
Subjective:    Patient ID: [name], female    DOB: Jun 18, 1946, 70 y.o.   MRN: [id number]  Chief Complaint  Patient presents with  . Hypertension    HPI:  [name] is a 70 y.o. female who  has a past medical history of Hyperlipidemia; Hypertension; Thyroid disease; Arthritis; Cataract; Heart murmur; and Adenomatous colon polyp. and presents today for a follow up office visit.   1.) Hypertension - Currently maintained on amlodipine, benazepril, and metoprolol. Reports taking the medication as prescribed with no adverse side effects. Blood pressure readings at home have been lower and below goal of 140/90. No symptoms of end organ damage.  BP Readings from Last 3 Encounters:  11/24/15 162/82  11/17/15 140/80  10/20/15 162/78    2.) Hyperlipidemia - Currently maintained on fenofibrate and atorvastatin. No adverse side effects or myalgias. Reports taking the medication as prescribed.  Lab Results  Component Value Date   CHOL 292* 05/07/2015   HDL 51.30 05/07/2015   LDLCALC 90 07/11/2007   LDLDIRECT 175.0 05/07/2015   TRIG 305.0* 05/07/2015   CHOLHDL 6 05/07/2015       Allergies  Allergen Reactions  . Codeine Sulfate     "made me jumpy"  . Erythromycin     nausea     Current Outpatient Prescriptions on File Prior to Visit  Medication Sig Dispense Refill  . acetaminophen (TYLENOL ARTHRITIS PAIN) 650 MG CR tablet Take 650 mg by mouth 4 (four) times daily as needed.     [name] aspirin 81 MG tablet Take 81 mg by mouth daily.      [name] atorvastatin (LIPITOR) 10 MG tablet TAKE 1 TABLET BY MOUTH EVERY DAY 90 tablet 1  . Calcium Carbonate-Vitamin D (CALTRATE 600+D PO) Take by mouth daily.      . fenofibrate micronized (LOFIBRA) 134 MG capsule TAKE ONE CAPSULE BY MOUTH EVERY DAY 90 capsule 1  . levothyroxine (SYNTHROID, LEVOTHROID) 100 MCG tablet TAKE 1 TABLET BY MOUTH DAILY EXCEPT 1 & 1/2 TABLETS ON WEDNESDAYS 90 tablet 1  . LORazepam (ATIVAN) 1 MG tablet TAKE 1/2 TO 1  TABLET BY MOUTH EVERY 8 HOURS AS NEEDED 60 tablet 0  . metoprolol succinate (TOPROL-XL) 50 MG 24 hr tablet Take 1 tablet (50 mg total) by mouth daily. Take with or immediately following a meal. 90 tablet 1  . Multiple Vitamins-Minerals (CENTRUM PO) Take by mouth daily.      . NONFORMULARY OR COMPOUNDED ITEM Estradiol .02% 1 ML Prefilled Applicator Sig: apply vaginally twice a week #90 Day Supply with 4 refills 1 each 4  . potassium citrate (UROCIT-K 10) 10 MEQ (1080 MG) SR tablet Take 10 mEq by mouth daily.     [name] amLODipine (NORVASC) 5 MG tablet TAKE 1 TABLET BY MOUTH EVERY DAY (Patient not taking: Reported on 11/17/2015) 90 tablet 0  . [DISCONTINUED] simvastatin (ZOCOR) 20 MG tablet Take 20 mg by mouth at bedtime.       No current facility-administered medications on file prior to visit.     Past Surgical History  Procedure Laterality Date  . Total abdominal hysterectomy w/ bilateral salpingoophorectomy  2003    [name] for dysfunctional menses  . Thyroidectomy  2000    [name]  . Tonsillectomy    . Nephrectomy  2010    Left for 9.8cm benign complex cyst  . Dilation and curettage of uterus      X2  . Colonoscopy w/ polypectomy  2003,2006,2013  [name]   Past Medical History  Diagnosis Date  . Hyperlipidemia   . Hypertension   . Thyroid disease   . Arthritis   . Cataract   . Heart murmur     MVP  . Adenomatous colon polyp      Review of Systems  Constitutional: Negative for fever and chills.  Respiratory: Negative for chest tightness and shortness of breath.   Cardiovascular: Negative for chest pain, palpitations and leg swelling.  Neurological: Negative for headaches.      Objective:    BP 162/82 mmHg  Pulse 95  Temp(Src) 97.7 F (36.5 C) (Oral)  Ht 5\' 6"  (1.676 m)  Wt 159 lb (72.122 kg)  BMI 25.68 kg/m2  SpO2 99% Nursing note and vital signs reviewed.  Physical Exam  Constitutional: She is oriented to person, place, and time. She appears  well-developed and well-nourished. No distress.  Cardiovascular: Normal rate, regular rhythm, normal heart sounds and intact distal pulses.   Pulmonary/Chest: Effort normal and breath sounds normal.  Neurological: She is alert and oriented to person, place, and time.  Skin: Skin is warm and dry.  Psychiatric: She has a normal mood and affect. Her behavior is normal. Judgment and thought content normal.       Assessment & Plan:   Problem List Items Addressed This Visit      Cardiovascular and Mediastinum   Essential hypertension - Primary    Blood pressure remains labile with home readings being significantly controlled with current regimen and below goal. Decrease amlodipine to 5 mg daily. Increase benazepril to 30 mg daily. Continue current dosage of metoprolol. Continue to monitor blood pressure at home. Obtain basic metabolic panel to check kidney function and lites. Follow-up in 3 months or sooner if needed.      Relevant Medications   benazepril (LOTENSIN) 20 MG tablet   Other Relevant Orders   Basic Metabolic Panel (BMET)     Other   HYPERLIPIDEMIA    Hyperlipidemia appears inadequately controlled with current regimen. Obtain lipid profile to check current status. Continue current dosage of atorvastatin and fenofibrate pending lipid profile results.      Relevant Medications   benazepril (LOTENSIN) 20 MG tablet   Other Relevant Orders   Lipid Profile

## 2015-11-24 NOTE — Progress Notes (Signed)
Pre visit review using our clinic review tool, if applicable. No additional management support is needed unless otherwise documented below in the visit note. 

## 2015-11-24 NOTE — Assessment & Plan Note (Signed)
Blood pressure remains labile with home readings being significantly controlled with current regimen and below goal. Decrease amlodipine to 5 mg daily. Increase benazepril to 30 mg daily. Continue current dosage of metoprolol. Continue to monitor blood pressure at home. Obtain basic metabolic panel to check kidney function and lites. Follow-up in 3 months or sooner if needed.

## 2015-11-24 NOTE — Assessment & Plan Note (Signed)
Hyperlipidemia appears inadequately controlled with current regimen. Obtain lipid profile to check current status. Continue current dosage of atorvastatin and fenofibrate pending lipid profile results.

## 2015-12-02 ENCOUNTER — Other Ambulatory Visit: Payer: Self-pay

## 2015-12-02 DIAGNOSIS — E890 Postprocedural hypothyroidism: Secondary | ICD-10-CM

## 2015-12-02 MED ORDER — LEVOTHYROXINE SODIUM 100 MCG PO TABS: ORAL_TABLET | ORAL | Status: DC

## 2015-12-14 ENCOUNTER — Telehealth: Payer: Self-pay

## 2015-12-14 NOTE — Telephone Encounter (Signed)
Call rec'd from the patient who stated she had difficulty with taking 30 mg of benazepril and dropped back to 20mg ; told her to communicate to Terri Piedra NP via my chart for recommendation with BP readings.  Feels much better; stated she was having generalized weakness and fatigue; Just FYI

## 2015-12-16 NOTE — Telephone Encounter (Signed)
Noted  

## 2015-12-20 ENCOUNTER — Telehealth: Payer: Self-pay

## 2015-12-20 NOTE — Telephone Encounter (Signed)
Ok with me 

## 2015-12-20 NOTE — Telephone Encounter (Signed)
patient would like to change to Dr. Quay Burow , her husband already sees him - they want they same doctor. Dr. Quay Burow ok with you? Dr. Elna Breslow ok with you?

## 2015-12-20 NOTE — Telephone Encounter (Signed)
Called back to let her know she could tranfer doctors. Left a VM

## 2015-12-21 ENCOUNTER — Other Ambulatory Visit: Payer: Self-pay | Admitting: Family

## 2015-12-22 ENCOUNTER — Other Ambulatory Visit: Payer: Self-pay | Admitting: Family

## 2015-12-24 ENCOUNTER — Encounter: Payer: Self-pay | Admitting: Family

## 2015-12-29 ENCOUNTER — Ambulatory Visit (INDEPENDENT_AMBULATORY_CARE_PROVIDER_SITE_OTHER): Payer: Medicare Other | Admitting: Family

## 2015-12-29 ENCOUNTER — Encounter: Payer: Self-pay | Admitting: Family

## 2015-12-29 VITALS — BP 144/88 | HR 97 | Temp 97.9°F | Resp 16 | Ht 66.0 in | Wt 158.1 lb

## 2015-12-29 DIAGNOSIS — J309 Allergic rhinitis, unspecified: Secondary | ICD-10-CM

## 2015-12-29 DIAGNOSIS — I10 Essential (primary) hypertension: Secondary | ICD-10-CM | POA: Diagnosis not present

## 2015-12-29 NOTE — Patient Instructions (Addendum)
Thank you for choosing [facility].  Summary/Instructions:   Please continue to take your medications as prescribed.  Monitor your blood pressures periodically.   If your symptoms worsen or fail to improve, please contact our office for further instruction, or in case of emergency go directly to the emergency room at the closest medical facility.   General Recommendations:    Please drink plenty of fluids.  Get plenty of rest   Sleep in humidified air  Use saline nasal sprays  Netti pot   OTC Medications:  Decongestants - helps relieve congestion   Flonase (generic fluticasone) or Nasacort (generic triamcinolone) - please make sure to use the "cross-over" technique at a 45 degree angle towards the opposite eye as opposed to straight up the nasal passageway.   Sudafed (generic pseudoephedrine - Note this is the one that is available behind the pharmacy counter); Products with phenylephrine (-PE) may also be used but is often not as effective as pseudoephedrine.   If you have HIGH BLOOD PRESSURE - Coricidin HBP; AVOID any product that is -D as this contains pseudoephedrine which may increase your blood pressure.  Afrin (oxymetazoline) every 6-8 hours for up to 3 days.   Allergies - helps relieve runny nose, itchy eyes and sneezing   Claritin (generic loratidine), Allegra (fexofenidine), or Zyrtec (generic cyrterizine) for runny nose. These medications should not cause drowsiness.  Note - Benadryl (generic diphenhydramine) may be used however may cause drowsiness  Cough -   Delsym or Robitussin (generic dextromethorphan)  Expectorants - helps loosen mucus to ease removal   Mucinex (generic guaifenesin) as directed on the package.  Headaches / General Aches   Tylenol (generic acetaminophen) - DO NOT EXCEED 3 grams (3,000 mg) in a 24 hour time period  Advil/Motrin (generic ibuprofen)   Sore Throat -   Salt water gargle   Chloraseptic (generic  benzocaine) spray or lozenges / Sucrets (generic dyclonine)

## 2015-12-29 NOTE — Progress Notes (Signed)
Subjective:    Patient ID: [name], female    DOB: 07-24-1946, 70 y.o.   MRN: [id number]  Chief Complaint  Patient presents with  . Follow-up    Blood pressure follow up, sore throat and sinus drainage, x2 weeks    HPI:  [name] is a 70 y.o. female who  has a past medical history of Hyperlipidemia; Hypertension; Thyroid disease; Arthritis; Cataract; Heart murmur; and Adenomatous colon polyp. and presents today for a follow up office  1.) Hypertension - Currently maintained on amlodipine, benazepril, and metoprolol. Reports taking the medication as prescribed and denies adverse side effects. No hypoglycemic readings. No symptoms of end organ damage reported. Home blood pressures have been elevated.  BP Readings from Last 3 Encounters:  12/29/15 144/88  11/24/15 162/82  11/17/15 140/80    2.) Sinus issues - This is a new problem. Associated symptom of sore throat, postnasal drip, cough in the morning, some sneezing. No fevers. Modifying factors include Mucinex which has seemed to help. Course of the symptoms has slightly worsened over the past 2 weeks. No recent antibiotic use.   Allergies  Allergen Reactions  . Codeine Sulfate     "made me jumpy"  . Erythromycin     nausea     Current Outpatient Prescriptions on File Prior to Visit  Medication Sig Dispense Refill  . acetaminophen (TYLENOL ARTHRITIS PAIN) 650 MG CR tablet Take 650 mg by mouth 4 (four) times daily as needed.     [name] amLODipine (NORVASC) 5 MG tablet TAKE 1 TABLET BY MOUTH EVERY DAY 90 tablet 0  . aspirin 81 MG tablet Take 81 mg by mouth daily.      [name] atorvastatin (LIPITOR) 10 MG tablet TAKE 1 TABLET BY MOUTH EVERY DAY 90 tablet 1  . benazepril (LOTENSIN) 20 MG tablet TAKE 1 AND 1/2 TABLET BY MOUTH EVERY DAY 45 tablet 0  . Calcium Carbonate-Vitamin D (CALTRATE 600+D PO) Take by mouth daily.      . fenofibrate 160 MG tablet Take 1 tablet (160 mg total) by mouth daily. 90 tablet 0  . fenofibrate  micronized (LOFIBRA) 134 MG capsule TAKE ONE CAPSULE BY MOUTH EVERY DAY 90 capsule 1  . levothyroxine (SYNTHROID, LEVOTHROID) 100 MCG tablet TAKE 1 TABLET BY MOUTH DAILY EXCEPT 1 & 1/2 TABLETS ON WEDNESDAYS 90 tablet 1  . LORazepam (ATIVAN) 1 MG tablet TAKE 1/2 TO 1 TABLET BY MOUTH EVERY 8 HOURS AS NEEDED 60 tablet 0  . metoprolol succinate (TOPROL-XL) 50 MG 24 hr tablet Take 1 tablet (50 mg total) by mouth daily. Take with or immediately following a meal. 90 tablet 1  . Multiple Vitamins-Minerals (CENTRUM PO) Take by mouth daily.      . potassium citrate (UROCIT-K 10) 10 MEQ (1080 MG) SR tablet Take 10 mEq by mouth daily.     . [DISCONTINUED] simvastatin (ZOCOR) 20 MG tablet Take 20 mg by mouth at bedtime.       No current facility-administered medications on file prior to visit.     Past Surgical History  Procedure Laterality Date  . Total abdominal hysterectomy w/ bilateral salpingoophorectomy  2003    [name] for dysfunctional menses  . Thyroidectomy  2000    [name]  . Tonsillectomy    . Nephrectomy  2010    Left for 9.8cm benign complex cyst  . Dilation and curettage of uterus      X2  . Colonoscopy w/ polypectomy  2003,2006,2013  [name]    Review of Systems  Constitutional: Negative for fever and chills.  HENT: Positive for congestion, sneezing and sore throat. Negative for ear pain and sinus pressure.   Eyes:       Negative for changes in vision.  Respiratory: Negative for cough, chest tightness and shortness of breath.   Cardiovascular: Negative for chest pain, palpitations and leg swelling.      Objective:    BP 144/88 mmHg  Pulse 97  Temp(Src) 97.9 F (36.6 C) (Oral)  Resp 16  Ht 5\' 6"  (1.676 m)  Wt 158 lb 1.9 oz (71.723 kg)  BMI 25.53 kg/m2  SpO2 97% Nursing note and vital signs reviewed.  Physical Exam  Constitutional: She is oriented to person, place, and time. She appears well-developed and well-nourished. No distress.  HENT:  Right Ear:  Hearing, tympanic membrane, external ear and ear canal normal.  Left Ear: Hearing, tympanic membrane, external ear and ear canal normal.  Nose: Right sinus exhibits no maxillary sinus tenderness and no frontal sinus tenderness. Left sinus exhibits no maxillary sinus tenderness and no frontal sinus tenderness.  Mouth/Throat: Uvula is midline, oropharynx is clear and moist and mucous membranes are normal.  Mildly inflamed turbinates noted.   Cardiovascular: Normal rate, regular rhythm, normal heart sounds and intact distal pulses.   Pulmonary/Chest: Effort normal and breath sounds normal.  Neurological: She is alert and oriented to person, place, and time.  Skin: Skin is warm and dry.  Psychiatric: She has a normal mood and affect. Her behavior is normal. Judgment and thought content normal.       Assessment & Plan:   Problem List Items Addressed This Visit      Cardiovascular and Mediastinum   Essential hypertension - Primary    Hypertension below goal 150/90 with current regimen with no adverse side effects or hypotensive readings. No symptoms of end organ damage noted. Continue to monitor blood pressure at home. Continue current dosage of metoprolol, benazepril, and amlodipine. Follow-up for annual exam.        Respiratory   Allergic rhinitis    Symptoms and exam consistent with allergic rhinitis and possible upper respiratory infection. Treat conservatively with over-the-counter medications as needed for symptom relief and supportive care. Follow-up if symptoms worsen or do not improve.          I have discontinued [name]'s NONFORMULARY OR COMPOUNDED ITEM. I am also having her maintain her aspirin, Calcium Carbonate-Vitamin D (CALTRATE 600+D PO), Multiple Vitamins-Minerals (CENTRUM PO), potassium citrate, acetaminophen, fenofibrate micronized, metoprolol succinate, atorvastatin, LORazepam, amLODipine, fenofibrate, levothyroxine, and benazepril.   Follow-up: Return in about 6  months (around 06/30/2016), or if symptoms worsen or fail to improve.  Mauricio Po, FNP

## 2015-12-29 NOTE — Assessment & Plan Note (Signed)
Hypertension below goal 150/90 with current regimen with no adverse side effects or hypotensive readings. No symptoms of end organ damage noted. Continue to monitor blood pressure at home. Continue current dosage of metoprolol, benazepril, and amlodipine. Follow-up for annual exam.

## 2015-12-29 NOTE — Assessment & Plan Note (Signed)
Symptoms and exam consistent with allergic rhinitis and possible upper respiratory infection. Treat conservatively with over-the-counter medications as needed for symptom relief and supportive care. Follow-up if symptoms worsen or do not improve.

## 2015-12-29 NOTE — Progress Notes (Signed)
Pre visit review using our clinic review tool, if applicable. No additional management support is needed unless otherwise documented below in the visit note. 

## 2016-01-05 ENCOUNTER — Ambulatory Visit (INDEPENDENT_AMBULATORY_CARE_PROVIDER_SITE_OTHER): Payer: Medicare Other | Admitting: Family

## 2016-01-05 ENCOUNTER — Encounter: Payer: Self-pay | Admitting: Family

## 2016-01-05 VITALS — BP 164/88 | HR 85 | Temp 98.0°F | Resp 16 | Ht 66.0 in | Wt 159.0 lb

## 2016-01-05 DIAGNOSIS — Z0000 Encounter for general adult medical examination without abnormal findings: Secondary | ICD-10-CM

## 2016-01-05 MED ORDER — ATORVASTATIN CALCIUM 20 MG PO TABS: 20.0000 mg | ORAL_TABLET | Freq: Every day | ORAL | Status: DC

## 2016-01-05 NOTE — Assessment & Plan Note (Signed)
Reviewed and updated patient's medical, surgical, family and social history. Medications and allergies were also reviewed. Basic screenings for depression, activities of daily living, hearing, cognition and safety were performed. Provider list was updated and health plan was provided to the patient.  

## 2016-01-05 NOTE — Progress Notes (Signed)
Pre visit review using our clinic review tool, if applicable. No additional management support is needed unless otherwise documented below in the visit note. 

## 2016-01-05 NOTE — Progress Notes (Signed)
Subjective:    Patient ID: [name], female    DOB: Sep 29, 1945, 70 y.o.   MRN: [id number]  Chief Complaint  Patient presents with  . CPE    already had lab work done, hep c screening?    HPI:  [name] is a 70 y.o. female who presents today for an annual wellness visit.   1) Health Maintenance -   Diet - Averages about 3 meals per day consisting of fruits, vegetables, chicken,beef pork, dairy; 1-2 cups of caffeine daily usually soda  Exercise - Chasing kids; No structured exercise   2) Preventative Exams / Immunizations:  Dental -- Due for exam  Vision -- Up to date   Health Maintenance  Topic Date Due  . Hepatitis C Screening  08-Nov-1945  . INFLUENZA VACCINE  03/28/2016  . COLONOSCOPY  01/03/2017  . MAMMOGRAM  03/09/2017  . TETANUS/TDAP  02/06/2023  . DEXA SCAN  Completed  . ZOSTAVAX  Completed  . PNA vac Low Risk Adult  Completed    Immunization History  Administered Date(s) Administered  . Influenza Split 06/21/2011, 06/12/2012  . Influenza Whole 07/11/2007, 06/10/2008, 06/29/2010  . Influenza, High Dose Seasonal PF 06/18/2013  . Influenza,inj,Quad PF,36+ Mos 06/10/2014, 05/07/2015  . Pneumococcal Conjugate-13 11/17/2015  . Pneumococcal Polysaccharide-23 09/17/2013  . Td 01/05/2003  . Tdap 02/05/2013  . Zoster 11/10/2011    Cardiac risk factors: advanced age (older than 42 for men, 18 for women), dyslipidemia and hypertension.  Depression Screen  Q1: Over the past two weeks, have you felt down, depressed or hopeless? No  Q2: Over the past two weeks, have you felt little interest or pleasure in doing things? No  Have you lost interest or pleasure in daily life? No  Do you often feel hopeless? No  Do you cry easily over simple problems? No  Activities of Daily Living In your present state of health, do you have any difficulty performing the following activities?:  Driving? No Managing money?  No Feeding yourself? No Getting from bed to  chair? No Climbing a flight of stairs? No Preparing food and eating?: No Bathing or showering? No Getting dressed: No Getting to the toilet? No Using the toilet: No Moving around from place to place: No In the past year have you fallen or had a near fall?:No   Home Safety Has smoke detector and wears seat belts. No firearms. No excess sun exposure. Are there smokers in your home (other than you)?  No Do you feel safe at home?  Yes  Hearing Difficulties: No Do you often ask people to speak up or repeat themselves? No Do you experience ringing or noises in your ears? No  Do you have difficulty understanding soft or whispered voices? No    Cognitive Testing  Alert? Yes   Normal Appearance? Yes  Oriented to person? Yes  Place? Yes   Time? Yes  Recall of three objects?  Yes  Can perform simple calculations? Yes  Displays appropriate judgment? Yes  Can read the correct time from a watch face? Yes  Do you feel that you have a problem with memory? No  Do you often misplace items? No   Advanced Directives have been discussed with the patient? Yes  Current Physicians/Providers and Suppliers  1. Terri Piedra, FNP - Internal Medicine 2. Ila Mcgill, DPM - Podiatry  Indicate any recent Medical Services you may have received from other than [facility] providers in the past year (date may be approximate).  All answers were reviewed with the patient and necessary referrals were made:  Mauricio Po, FNP   Allergies  Allergen Reactions  . Codeine Sulfate     "made me jumpy"  . Erythromycin     nausea     Outpatient Prescriptions Prior to Visit  Medication Sig Dispense Refill  . acetaminophen (TYLENOL ARTHRITIS PAIN) 650 MG CR tablet Take 650 mg by mouth 4 (four) times daily as needed.     [name] amLODipine (NORVASC) 5 MG tablet TAKE 1 TABLET BY MOUTH EVERY DAY 90 tablet 0  . aspirin 81 MG tablet Take 81 mg by mouth daily.      . benazepril (LOTENSIN) 20 MG tablet TAKE 1 AND 1/2  TABLET BY MOUTH EVERY DAY 45 tablet 0  . Calcium Carbonate-Vitamin D (CALTRATE 600+D PO) Take by mouth daily.      . fenofibrate 160 MG tablet Take 1 tablet (160 mg total) by mouth daily. 90 tablet 0  . levothyroxine (SYNTHROID, LEVOTHROID) 100 MCG tablet TAKE 1 TABLET BY MOUTH DAILY EXCEPT 1 & 1/2 TABLETS ON WEDNESDAYS 90 tablet 1  . LORazepam (ATIVAN) 1 MG tablet TAKE 1/2 TO 1 TABLET BY MOUTH EVERY 8 HOURS AS NEEDED 60 tablet 0  . metoprolol succinate (TOPROL-XL) 50 MG 24 hr tablet Take 1 tablet (50 mg total) by mouth daily. Take with or immediately following a meal. 90 tablet 1  . Multiple Vitamins-Minerals (CENTRUM PO) Take by mouth daily.      . potassium citrate (UROCIT-K 10) 10 MEQ (1080 MG) SR tablet Take 10 mEq by mouth daily.     [name] atorvastatin (LIPITOR) 10 MG tablet TAKE 1 TABLET BY MOUTH EVERY DAY 90 tablet 1  . fenofibrate micronized (LOFIBRA) 134 MG capsule TAKE ONE CAPSULE BY MOUTH EVERY DAY 90 capsule 1   No facility-administered medications prior to visit.     Past Medical History  Diagnosis Date  . Hyperlipidemia   . Hypertension   . Thyroid disease   . Arthritis   . Cataract   . Heart murmur     MVP  . Adenomatous colon polyp      Past Surgical History  Procedure Laterality Date  . Total abdominal hysterectomy w/ bilateral salpingoophorectomy  2003    [name] for dysfunctional menses  . Thyroidectomy  2000    [name]  . Tonsillectomy    . Nephrectomy  2010    Left for 9.8cm benign complex cyst  . Dilation and curettage of uterus      X2  . Colonoscopy w/ polypectomy  2003,2006,2013    [name]     Family History  Problem Relation Age of Onset  . Transient ischemic attack Father     in 70s  . Coronary artery disease Father     stents @ 67  . Nephrolithiasis Father   . Hyperlipidemia Mother   . Transient ischemic attack Mother 56  . Thyroid disease Mother     hyperthyroid  . Urolithiasis Sister   . Diabetes Neg Hx      Social History    Social History  . Marital Status: Married    Spouse Name: N/A  . Number of Children: N/A  . Years of Education: N/A   Occupational History  . Not on file.   Social History Main Topics  . Smoking status: Former Smoker    Quit date: 08/29/1975  . Smokeless tobacco: Never Used     Comment: smoked 1973-1977, up to 1/2 ppd  .  Alcohol Use: 0.0 oz/week    0 Standard drinks or equivalent per week     Comment: minimally  1 every 6 months  . Drug Use: No  . Sexual Activity: Yes     Comment: HYST   Other Topics Concern  . Not on file   Social History Narrative     Review of Systems  Constitutional: Denies fever, chills, fatigue, or significant weight gain/loss. HENT: Head: Denies headache or neck pain Ears: Denies changes in hearing, ringing in ears, earache, drainage Nose: Denies discharge, stuffiness, itching, nosebleed, sinus pain Throat: Denies sore throat, hoarseness, dry mouth, sores, thrush Eyes: Denies loss/changes in vision, pain, redness, blurry/double vision, flashing lights Cardiovascular: Denies chest pain/discomfort, tightness, palpitations, shortness of breath with activity, difficulty lying down, swelling, sudden awakening with shortness of breath Respiratory: Denies shortness of breath, cough, sputum production, wheezing Gastrointestinal: Denies dysphasia, heartburn, change in appetite, nausea, change in bowel habits, rectal bleeding, constipation, diarrhea, yellow skin or eyes Genitourinary: Denies frequency, urgency, burning/pain, blood in urine, incontinence, change in urinary strength. Musculoskeletal: Denies muscle/joint pain, stiffness, back pain, redness or swelling of joints, trauma Skin: Denies rashes, lumps, itching, dryness, color changes, or hair/nail changes Neurological: Denies dizziness, fainting, seizures, weakness, numbness, tingling, tremor Psychiatric - Denies nervousness, stress, depression or memory loss Endocrine: Denies heat or cold  intolerance, sweating, frequent urination, excessive thirst, changes in appetite Hematologic: Denies ease of bruising or bleeding     Objective:    BP 164/88 mmHg  Pulse 85  Temp(Src) 98 F (36.7 C) (Oral)  Resp 16  Ht 5\' 6"  (1.676 m)  Wt 159 lb (72.122 kg)  BMI 25.68 kg/m2  SpO2 96% Nursing note and vital signs reviewed.  Physical Exam  Constitutional: She is oriented to person, place, and time. She appears well-developed and well-nourished.  HENT:  Head: Normocephalic.  Right Ear: Hearing, tympanic membrane, external ear and ear canal normal.  Left Ear: Hearing, tympanic membrane, external ear and ear canal normal.  Nose: Nose normal.  Mouth/Throat: Uvula is midline, oropharynx is clear and moist and mucous membranes are normal.  Eyes: Conjunctivae and EOM are normal. Pupils are equal, round, and reactive to light.  Neck: Neck supple. No JVD present. No tracheal deviation present. No thyromegaly present.  Cardiovascular: Normal rate, regular rhythm, normal heart sounds and intact distal pulses.   Pulmonary/Chest: Effort normal and breath sounds normal.  Abdominal: Soft. Bowel sounds are normal. She exhibits no distension and no mass. There is no tenderness. There is no rebound and no guarding.  Musculoskeletal: Normal range of motion. She exhibits no edema or tenderness.  Lymphadenopathy:    She has no cervical adenopathy.  Neurological: She is alert and oriented to person, place, and time. She has normal reflexes. No cranial nerve deficit. She exhibits normal muscle tone. Coordination normal.  Skin: Skin is warm and dry.  Psychiatric: She has a normal mood and affect. Her behavior is normal. Judgment and thought content normal.       Assessment & Plan:   Problem List Items Addressed This Visit      Other   Routine general medical examination at a health care facility - Primary    1) Anticipatory Guidance: Discussed importance of wearing a seatbelt while driving and  not texting while driving; changing batteries in smoke detector at least once annually; wearing suntan lotion when outside; eating a balanced and moderate diet; getting physical activity at least 30 minutes per day.  2) Immunizations / Screenings /  Labs:  All immunizations are up to date per recommendation. Due for a dental screening which is scheduled. Obtain Hepatitis C antibody for hepatitis C screening. All other screenings are up to date per recommendations. Blood work has been previously completed.   Overall well exam with risk factors for cardiovascular disease including hypertension and hyperlipidemia. Blood pressure remains labile with current medication regimen although improved with home readings. Continue current dosage of blood pressure medications. Lipids elevated on last lipid profile. Increase atorvastatin. Continue other healthy lifestyle behaviors and choices. Encouraged fruits and vegetables and nutrient dense foods. Recommend increased physical activity of 30 minutes of moderate level activity daily. Follow-up prevention exam in 1 year. Follow-up office visit as needed for chronic conditions.        Relevant Orders   Hepatitis C antibody   Medicare annual wellness visit, subsequent    Reviewed and updated patient's medical, surgical, family and social history. Medications and allergies were also reviewed. Basic screenings for depression, activities of daily living, hearing, cognition and safety were performed. Provider list was updated and health plan was provided to the patient.           I have discontinued Ms. Franchini's fenofibrate micronized. I have also changed her atorvastatin. Additionally, I am having her maintain her aspirin, Calcium Carbonate-Vitamin D (CALTRATE 600+D PO), Multiple Vitamins-Minerals (CENTRUM PO), potassium citrate, acetaminophen, metoprolol succinate, LORazepam, amLODipine, fenofibrate, levothyroxine, and benazepril.    Meds ordered this encounter    Medications  . atorvastatin (LIPITOR) 20 MG tablet    Sig: Take 1 tablet (20 mg total) by mouth daily at 6 PM.    Dispense:  90 tablet    Refill:  0    Order Specific Question:  Supervising Provider    Answer:  Hoyt Koch [id number]      Follow-up:  3 months  Mauricio Po, FNP

## 2016-01-05 NOTE — Patient Instructions (Signed)
Thank you for choosing [facility].  Summary/Instructions:  Your prescription(s) have been submitted to your pharmacy or been printed and provided for you. Please take as directed and contact our office if you believe you are having problem(s) with the medication(s) or have any questions.  Please stop by the lab on the basement level of the building for your blood work. Your results will be released to [place] (or called to you) after review, usually within 72 hours after test completion. If any changes need to be made, you will be notified at that same time.  Please stop by radiology on the basement level of the building for your x-rays. Your results will be released to [place] (or called to you) after review, usually within 72 hours after test completion. If any treatments or changes are necessary, you will be notified at that same time.  Referrals have been made during this visit. You should expect to hear back from our schedulers in about 7-10 days in regards to establishing an appointment with the specialists we discussed.   If your symptoms worsen or fail to improve, please contact our office for further instruction, or in case of emergency go directly to the emergency room at the closest medical facility.   Health Maintenance  Topic Date Due  . Hepatitis C Screening  1946/06/07  . INFLUENZA VACCINE  03/28/2016  . COLONOSCOPY  01/03/2017  . MAMMOGRAM  03/09/2017  . TETANUS/TDAP  02/06/2023  . DEXA SCAN  Completed  . ZOSTAVAX  Completed  . PNA vac Low Risk Adult  Completed    Health Maintenance, Female Adopting a healthy lifestyle and getting preventive care can go a long way to promote health and wellness. Talk with your health care provider about what schedule of regular examinations is right for you. This is a good chance for you to check in with your provider about disease prevention and staying healthy. In between checkups, there are plenty of things you can do on your  own. Experts have done a lot of research about which lifestyle changes and preventive measures are most likely to keep you healthy. Ask your health care provider for more information. WEIGHT AND DIET  Eat a healthy diet  Be sure to include plenty of vegetables, fruits, low-fat dairy products, and lean protein.  Do not eat a lot of foods high in solid fats, added sugars, or salt.  Get regular exercise. This is one of the most important things you can do for your health.  Most adults should exercise for at least 150 minutes each week. The exercise should increase your heart rate and make you sweat (moderate-intensity exercise).  Most adults should also do strengthening exercises at least twice a week. This is in addition to the moderate-intensity exercise.  Maintain a healthy weight  Body mass index (BMI) is a measurement that can be used to identify possible weight problems. It estimates body fat based on height and weight. Your health care provider can help determine your BMI and help you achieve or maintain a healthy weight.  For females 70 years of age and older:   A BMI below 18.5 is considered underweight.  A BMI of 18.5 to 24.9 is normal.  A BMI of 25 to 29.9 is considered overweight.  A BMI of 30 and above is considered obese.  Watch levels of cholesterol and blood lipids  You should start having your blood tested for lipids and cholesterol at 70 years of age, then have this test  every 5 years.  You may need to have your cholesterol levels checked more often if:  Your lipid or cholesterol levels are high.  You are older than 70 years of age.  You are at high risk for heart disease.  CANCER SCREENING   Lung Cancer  Lung cancer screening is recommended for adults 70-70 years old who are at high risk for lung cancer because of a history of smoking.  A yearly low-dose CT scan of the lungs is recommended for people who:  Currently smoke.  Have quit within the past  15 years.  Have at least a 30-pack-year history of smoking. A pack year is smoking an average of one pack of cigarettes a day for 1 year.  Yearly screening should continue until it has been 15 years since you quit.  Yearly screening should stop if you develop a health problem that would prevent you from having lung cancer treatment.  Breast Cancer  Practice breast self-awareness. This means understanding how your breasts normally appear and feel.  It also means doing regular breast self-exams. Let your health care provider know about any changes, no matter how small.  If you are in your 70s or 30s, you should have a clinical breast exam (CBE) by a health care provider every 1-3 years as part of a regular health exam.  If you are 70 or older, have a CBE every year. Also consider having a breast X-ray (mammogram) every year.  If you have a family history of breast cancer, talk to your health care provider about genetic screening.  If you are at high risk for breast cancer, talk to your health care provider about having an MRI and a mammogram every year.  Breast cancer gene (BRCA) assessment is recommended for women who have family members with BRCA-related cancers. BRCA-related cancers include:  Breast.  Ovarian.  Tubal.  Peritoneal cancers.  Results of the assessment will determine the need for genetic counseling and BRCA1 and BRCA2 testing. Cervical Cancer Your health care provider may recommend that you be screened regularly for cancer of the pelvic organs (ovaries, uterus, and vagina). This screening involves a pelvic examination, including checking for microscopic changes to the surface of your cervix (Pap test). You may be encouraged to have this screening done every 3 years, beginning at age 1.  For women ages 63-65, health care providers may recommend pelvic exams and Pap testing every 3 years, or they may recommend the Pap and pelvic exam, combined with testing for human  papilloma virus (HPV), every 5 years. Some types of HPV increase your risk of cervical cancer. Testing for HPV may also be done on women of any age with unclear Pap test results.  Other health care providers may not recommend any screening for nonpregnant women who are considered low risk for pelvic cancer and who do not have symptoms. Ask your health care provider if a screening pelvic exam is right for you.  If you have had past treatment for cervical cancer or a condition that could lead to cancer, you need Pap tests and screening for cancer for at least 20 years after your treatment. If Pap tests have been discontinued, your risk factors (such as having a new sexual partner) need to be reassessed to determine if screening should resume. Some women have medical problems that increase the chance of getting cervical cancer. In these cases, your health care provider may recommend more frequent screening and Pap tests. Colorectal Cancer  This type of  cancer can be detected and often prevented.  Routine colorectal cancer screening usually begins at 70 years of age and continues through 70 years of age.  Your health care provider may recommend screening at an earlier age if you have risk factors for colon cancer.  Your health care provider may also recommend using home test kits to check for hidden blood in the stool.  A small camera at the end of a tube can be used to examine your colon directly (sigmoidoscopy or colonoscopy). This is done to check for the earliest forms of colorectal cancer.  Routine screening usually begins at age 66.  Direct examination of the colon should be repeated every 5-10 years through 70 years of age. However, you may need to be screened more often if early forms of precancerous polyps or small growths are found. Skin Cancer  Check your skin from head to toe regularly.  Tell your health care provider about any new moles or changes in moles, especially if there is a  change in a mole's shape or color.  Also tell your health care provider if you have a mole that is larger than the size of a pencil eraser.  Always use sunscreen. Apply sunscreen liberally and repeatedly throughout the day.  Protect yourself by wearing long sleeves, pants, a wide-brimmed hat, and sunglasses whenever you are outside. HEART DISEASE, DIABETES, AND HIGH BLOOD PRESSURE   High blood pressure causes heart disease and increases the risk of stroke. High blood pressure is more likely to develop in:  People who have blood pressure in the high end of the normal range (130-139/85-89 mm Hg).  People who are overweight or obese.  People who are African American.  If you are 31-46 years of age, have your blood pressure checked every 3-5 years. If you are 37 years of age or older, have your blood pressure checked every year. You should have your blood pressure measured twice--once when you are at a hospital or clinic, and once when you are not at a hospital or clinic. Record the average of the two measurements. To check your blood pressure when you are not at a hospital or clinic, you can use:  An automated blood pressure machine at a pharmacy.  A home blood pressure monitor.  If you are between 12 years and 7 years old, ask your health care provider if you should take aspirin to prevent strokes.  Have regular diabetes screenings. This involves taking a blood sample to check your fasting blood sugar level.  If you are at a normal weight and have a low risk for diabetes, have this test once every three years after 70 years of age.  If you are overweight and have a high risk for diabetes, consider being tested at a younger age or more often. PREVENTING INFECTION  Hepatitis B  If you have a higher risk for hepatitis B, you should be screened for this virus. You are considered at high risk for hepatitis B if:  You were born in a country where hepatitis B is common. Ask your health  care provider which countries are considered high risk.  Your parents were born in a high-risk country, and you have not been immunized against hepatitis B (hepatitis B vaccine).  You have HIV or AIDS.  You use needles to inject street drugs.  You live with someone who has hepatitis B.  You have had sex with someone who has hepatitis B.  You get hemodialysis treatment.  You  take certain medicines for conditions, including cancer, organ transplantation, and autoimmune conditions. Hepatitis C  Blood testing is recommended for:  Everyone born from [birth year] through 1965.  Anyone with known risk factors for hepatitis C. Sexually transmitted infections (STIs)  You should be screened for sexually transmitted infections (STIs) including gonorrhea and chlamydia if:  You are sexually active and are younger than 70 years of age.  You are older than 70 years of age and your health care provider tells you that you are at risk for this type of infection.  Your sexual activity has changed since you were last screened and you are at an increased risk for chlamydia or gonorrhea. Ask your health care provider if you are at risk.  If you do not have HIV, but are at risk, it may be recommended that you take a prescription medicine daily to prevent HIV infection. This is called pre-exposure prophylaxis (PrEP). You are considered at risk if:  You are sexually active and do not regularly use condoms or know the HIV status of your partner(s).  You take drugs by injection.  You are sexually active with a partner who has HIV. Talk with your health care provider about whether you are at high risk of being infected with HIV. If you choose to begin PrEP, you should first be tested for HIV. You should then be tested every 3 months for as long as you are taking PrEP.  PREGNANCY   If you are premenopausal and you may become pregnant, ask your health care provider about preconception counseling.  If you may  become pregnant, take 400 to 800 micrograms (mcg) of folic acid every day.  If you want to prevent pregnancy, talk to your health care provider about birth control (contraception). OSTEOPOROSIS AND MENOPAUSE   Osteoporosis is a disease in which the bones lose minerals and strength with aging. This can result in serious bone fractures. Your risk for osteoporosis can be identified using a bone density scan.  If you are 28 years of age or older, or if you are at risk for osteoporosis and fractures, ask your health care provider if you should be screened.  Ask your health care provider whether you should take a calcium or vitamin D supplement to lower your risk for osteoporosis.  Menopause may have certain physical symptoms and risks.  Hormone replacement therapy may reduce some of these symptoms and risks. Talk to your health care provider about whether hormone replacement therapy is right for you.  HOME CARE INSTRUCTIONS   Schedule regular health, dental, and eye exams.  Stay current with your immunizations.   Do not use any tobacco products including cigarettes, chewing tobacco, or electronic cigarettes.  If you are pregnant, do not drink alcohol.  If you are breastfeeding, limit how much and how often you drink alcohol.  Limit alcohol intake to no more than 1 drink per day for nonpregnant women. One drink equals 12 ounces of beer, 5 ounces of wine, or 1 ounces of hard liquor.  Do not use street drugs.  Do not share needles.  Ask your health care provider for help if you need support or information about quitting drugs.  Tell your health care provider if you often feel depressed.  Tell your health care provider if you have ever been abused or do not feel safe at home.   This information is not intended to replace advice given to you by your health care provider. Make sure you discuss any questions  you have with your health care provider.   Document Released: 02/27/2011  Document Revised: 09/04/2014 Document Reviewed: 07/16/2013 Elsevier Interactive Patient Education [employer].

## 2016-01-05 NOTE — Assessment & Plan Note (Signed)
1) Anticipatory Guidance: Discussed importance of wearing a seatbelt while driving and not texting while driving; changing batteries in smoke detector at least once annually; wearing suntan lotion when outside; eating a balanced and moderate diet; getting physical activity at least 30 minutes per day.  2) Immunizations / Screenings / Labs:  All immunizations are up to date per recommendation. Due for a dental screening which is scheduled. Obtain Hepatitis C antibody for hepatitis C screening. All other screenings are up to date per recommendations. Blood work has been previously completed.   Overall well exam with risk factors for cardiovascular disease including hypertension and hyperlipidemia. Blood pressure remains labile with current medication regimen although improved with home readings. Continue current dosage of blood pressure medications. Lipids elevated on last lipid profile. Increase atorvastatin. Continue other healthy lifestyle behaviors and choices. Encouraged fruits and vegetables and nutrient dense foods. Recommend increased physical activity of 30 minutes of moderate level activity daily. Follow-up prevention exam in 1 year. Follow-up office visit as needed for chronic conditions.

## 2016-01-07 ENCOUNTER — Other Ambulatory Visit: Payer: Self-pay | Admitting: Family

## 2016-01-19 ENCOUNTER — Other Ambulatory Visit: Payer: Self-pay | Admitting: Family

## 2016-02-19 ENCOUNTER — Other Ambulatory Visit: Payer: Self-pay | Admitting: Family

## 2016-03-06 ENCOUNTER — Other Ambulatory Visit: Payer: Self-pay | Admitting: Family

## 2016-03-17 ENCOUNTER — Telehealth: Payer: Self-pay

## 2016-03-17 NOTE — Telephone Encounter (Signed)
Left vm for patient to call back to schedule

## 2016-03-17 NOTE — Telephone Encounter (Signed)
Pt needs to schedule a follow up for August. Can you call pt at your convenience to schedule please.

## 2016-03-22 ENCOUNTER — Other Ambulatory Visit: Payer: Self-pay | Admitting: Internal Medicine

## 2016-03-22 DIAGNOSIS — I10 Essential (primary) hypertension: Secondary | ICD-10-CM

## 2016-04-06 ENCOUNTER — Telehealth: Payer: Self-pay

## 2016-04-06 ENCOUNTER — Other Ambulatory Visit: Payer: Self-pay | Admitting: Family

## 2016-04-06 DIAGNOSIS — Z0000 Encounter for general adult medical examination without abnormal findings: Secondary | ICD-10-CM

## 2016-04-06 NOTE — Telephone Encounter (Signed)
Lab orders placed.  

## 2016-04-06 NOTE — Telephone Encounter (Signed)
Please advise 

## 2016-04-06 NOTE — Telephone Encounter (Signed)
Patient is requesting her labs be put in on 8/16 before her cpe on 8/23. Please advise or follow up, Thank you.

## 2016-04-10 NOTE — Telephone Encounter (Signed)
LVM letting pt know.  

## 2016-04-12 ENCOUNTER — Ambulatory Visit: Payer: Self-pay | Admitting: Family

## 2016-04-12 ENCOUNTER — Other Ambulatory Visit (INDEPENDENT_AMBULATORY_CARE_PROVIDER_SITE_OTHER): Payer: Medicare Other

## 2016-04-12 DIAGNOSIS — Z0000 Encounter for general adult medical examination without abnormal findings: Secondary | ICD-10-CM | POA: Diagnosis not present

## 2016-04-12 LAB — COMPREHENSIVE METABOLIC PANEL
ALK PHOS: 87 U/L (ref 39–117)
ALT: 17 U/L (ref 0–35)
AST: 19 U/L (ref 0–37)
Albumin: 4.4 g/dL (ref 3.5–5.2)
BILIRUBIN TOTAL: 0.5 mg/dL (ref 0.2–1.2)
BUN: 24 mg/dL — AB (ref 6–23)
CO2: 30 mEq/L (ref 19–32)
CREATININE: 0.94 mg/dL (ref 0.40–1.20)
Calcium: 9.6 mg/dL (ref 8.4–10.5)
Chloride: 104 mEq/L (ref 96–112)
GFR: 62.53 mL/min (ref >60.00)
GLUCOSE: 96 mg/dL (ref 70–99)
Potassium: 4.5 mEq/L (ref 3.5–5.1)
SODIUM: 141 meq/L (ref 135–145)
TOTAL PROTEIN: 7.4 g/dL (ref 6.0–8.3)

## 2016-04-12 LAB — CBC
HCT: 40.8 % (ref 36.0–46.0)
Hemoglobin: 14 g/dL (ref 12.0–15.0)
MCHC: 34.3 g/dL (ref 30.0–36.0)
MCV: 89.8 fl (ref 78.0–100.0)
Platelets: 318 10*3/uL (ref 150.0–400.0)
RBC: 4.54 Mil/uL (ref 3.87–5.11)
RDW: 13.2 % (ref 11.5–15.5)
WBC: 7.5 10*3/uL (ref 4.0–10.5)

## 2016-04-12 LAB — LIPID PANEL
Cholesterol: 251 mg/dL — ABNORMAL HIGH (ref 0–200)
HDL: 45 mg/dL (ref >39.00)
NonHDL: 206.4
Total CHOL/HDL Ratio: 6
Triglycerides: 320 mg/dL — ABNORMAL HIGH (ref 0.0–149.0)
VLDL: 64 mg/dL — ABNORMAL HIGH (ref 0.0–40.0)

## 2016-04-12 LAB — VITAMIN D 25 HYDROXY (VIT D DEFICIENCY, FRACTURES): VITD: 26.74 ng/mL — AB (ref 30.00–100.00)

## 2016-04-12 LAB — LDL CHOLESTEROL, DIRECT: LDL DIRECT: 120 mg/dL

## 2016-04-13 ENCOUNTER — Encounter: Payer: Self-pay | Admitting: Family

## 2016-04-13 LAB — HEPATITIS C ANTIBODY: HCV AB: NEGATIVE

## 2016-04-19 ENCOUNTER — Ambulatory Visit: Payer: Self-pay | Admitting: Family

## 2016-04-21 ENCOUNTER — Ambulatory Visit (INDEPENDENT_AMBULATORY_CARE_PROVIDER_SITE_OTHER): Payer: Medicare Other | Admitting: Family

## 2016-04-21 ENCOUNTER — Encounter: Payer: Self-pay | Admitting: Family

## 2016-04-21 VITALS — BP 162/90 | HR 78 | Temp 98.3°F | Resp 16 | Ht 66.0 in | Wt 157.0 lb

## 2016-04-21 DIAGNOSIS — E781 Pure hyperglyceridemia: Secondary | ICD-10-CM | POA: Diagnosis not present

## 2016-04-21 DIAGNOSIS — R29898 Other symptoms and signs involving the musculoskeletal system: Secondary | ICD-10-CM

## 2016-04-21 DIAGNOSIS — Z23 Encounter for immunization: Secondary | ICD-10-CM

## 2016-04-21 DIAGNOSIS — M539 Dorsopathy, unspecified: Secondary | ICD-10-CM | POA: Diagnosis not present

## 2016-04-21 DIAGNOSIS — E785 Hyperlipidemia, unspecified: Secondary | ICD-10-CM | POA: Insufficient documentation

## 2016-04-21 MED ORDER — FENOFIBRATE MICRONIZED 134 MG PO CAPS: 134.0000 mg | ORAL_CAPSULE | Freq: Every day | ORAL | 0 refills | Status: DC

## 2016-04-21 MED ORDER — DICLOFENAC SODIUM 2 % TD SOLN: 1.0000 "application " | Freq: Two times a day (BID) | TRANSDERMAL | 0 refills | Status: DC | PRN

## 2016-04-21 NOTE — Patient Instructions (Signed)
Thank you for choosing [facility]!  Ice / moist heat x 20 minutes every 2 hours and as needed or following activity  Pennsaid - Approximately 1/2 packet to the affected site twice daily.   Exercises 1-2 times per day as instructed.   Your prescription(s) have been submitted to your pharmacy or been printed and provided for you. Please take as directed and contact our office if you believe you are having problem(s) with the medication(s) or have any questions.  If your symptoms worsen or fail to improve, please contact our office for further instruction, or in case of emergency go directly to the emergency room at the closest medical facility.    Cervical Strain and Sprain With Rehab Cervical strain and sprain are injuries that commonly occur with "whiplash" injuries. Whiplash occurs when the neck is forcefully whipped backward or forward, such as during a motor vehicle accident or during contact sports. The muscles, ligaments, tendons, discs, and nerves of the neck are susceptible to injury when this occurs. RISK FACTORS Risk of having a whiplash injury increases if:  Osteoarthritis of the spine.  Situations that make head or neck accidents or trauma more likely.  High-risk sports (football, rugby, wrestling, hockey, auto racing, gymnastics, diving, contact karate, or boxing).  Poor strength and flexibility of the neck.  Previous neck injury.  Poor tackling technique.  Improperly fitted or padded equipment. SYMPTOMS   Pain or stiffness in the front or back of neck or both.  Symptoms may present immediately or up to 24 hours after injury.  Dizziness, headache, nausea, and vomiting.  Muscle spasm with soreness and stiffness in the neck.  Tenderness and swelling at the injury site. PREVENTION  Learn and use proper technique (avoid tackling with the head, spearing, and head-butting; use proper falling techniques to avoid landing on the head).  Warm up and stretch  properly before activity.  Maintain physical fitness:  Strength, flexibility, and endurance.  Cardiovascular fitness.  Wear properly fitted and padded protective equipment, such as padded soft collars, for participation in contact sports. PROGNOSIS  Recovery from cervical strain and sprain injuries is dependent on the extent of the injury. These injuries are usually curable in 1 week to 3 months with appropriate treatment.  RELATED COMPLICATIONS   Temporary numbness and weakness may occur if the nerve roots are damaged, and this may persist until the nerve has completely healed.  Chronic pain due to frequent recurrence of symptoms.  Prolonged healing, especially if activity is resumed too soon (before complete recovery). TREATMENT  Treatment initially involves the use of ice and medication to help reduce pain and inflammation. It is also important to perform strengthening and stretching exercises and modify activities that worsen symptoms so the injury does not get worse. These exercises may be performed at home or with a therapist. For patients who experience severe symptoms, a soft, padded collar may be recommended to be worn around the neck.  Improving your posture may help reduce symptoms. Posture improvement includes pulling your chin and abdomen in while sitting or standing. If you are sitting, sit in a firm chair with your buttocks against the back of the chair. While sleeping, try replacing your pillow with a small towel rolled to 2 inches in diameter, or use a cervical pillow or soft cervical collar. Poor sleeping positions delay healing.  For patients with nerve root damage, which causes numbness or weakness, the use of a cervical traction apparatus may be recommended. Surgery is rarely necessary for  these injuries. However, cervical strain and sprains that are present at birth (congenital) may require surgery. MEDICATION   If pain medication is necessary, nonsteroidal  anti-inflammatory medications, such as aspirin and ibuprofen, or other minor pain relievers, such as acetaminophen, are often recommended.  Do not take pain medication for 7 days before surgery.  Prescription pain relievers may be given if deemed necessary by your caregiver. Use only as directed and only as much as you need. HEAT AND COLD:   Cold treatment (icing) relieves pain and reduces inflammation. Cold treatment should be applied for 10 to 15 minutes every 2 to 3 hours for inflammation and pain and immediately after any activity that aggravates your symptoms. Use ice packs or an ice massage.  Heat treatment may be used prior to performing the stretching and strengthening activities prescribed by your caregiver, physical therapist, or athletic trainer. Use a heat pack or a warm soak. SEEK MEDICAL CARE IF:   Symptoms get worse or do not improve in 2 weeks despite treatment.  New, unexplained symptoms develop (drugs used in treatment may produce side effects). EXERCISES RANGE OF MOTION (ROM) AND STRETCHING EXERCISES - Cervical Strain and Sprain These exercises may help you when beginning to rehabilitate your injury. In order to successfully resolve your symptoms, you must improve your posture. These exercises are designed to help reduce the forward-head and rounded-shoulder posture which contributes to this condition. Your symptoms may resolve with or without further involvement from your physician, physical therapist or athletic trainer. While completing these exercises, remember:   Restoring tissue flexibility helps normal motion to return to the joints. This allows healthier, less painful movement and activity.  An effective stretch should be held for at least 20 seconds, although you may need to begin with shorter hold times for comfort.  A stretch should never be painful. You should only feel a gentle lengthening or release in the stretched tissue. STRETCH- Axial Extensors  Lie on  your back on the floor. You may bend your knees for comfort. Place a rolled-up hand towel or dish towel, about 2 inches in diameter, under the part of your head that makes contact with the floor.  Gently tuck your chin, as if trying to make a "double chin," until you feel a gentle stretch at the base of your head.  Hold __________ seconds. Repeat __________ times. Complete this exercise __________ times per day.  STRETCH - Axial Extension   Stand or sit on a firm surface. Assume a good posture: chest up, shoulders drawn back, abdominal muscles slightly tense, knees unlocked (if standing) and feet hip width apart.  Slowly retract your chin so your head slides back and your chin slightly lowers. Continue to look straight ahead.  You should feel a gentle stretch in the back of your head. Be certain not to feel an aggressive stretch since this can cause headaches later.  Hold for __________ seconds. Repeat __________ times. Complete this exercise __________ times per day. STRETCH - Cervical Side Bend   Stand or sit on a firm surface. Assume a good posture: chest up, shoulders drawn back, abdominal muscles slightly tense, knees unlocked (if standing) and feet hip width apart.  Without letting your nose or shoulders move, slowly tip your right / left ear to your shoulder until your feel a gentle stretch in the muscles on the opposite side of your neck.  Hold __________ seconds. Repeat __________ times. Complete this exercise __________ times per day. STRETCH - Cervical Rotators  Stand or sit on a firm surface. Assume a good posture: chest up, shoulders drawn back, abdominal muscles slightly tense, knees unlocked (if standing) and feet hip width apart.  Keeping your eyes level with the ground, slowly turn your head until you feel a gentle stretch along the back and opposite side of your neck.  Hold __________ seconds. Repeat __________ times. Complete this exercise __________ times per  day. RANGE OF MOTION - Neck Circles   Stand or sit on a firm surface. Assume a good posture: chest up, shoulders drawn back, abdominal muscles slightly tense, knees unlocked (if standing) and feet hip width apart.  Gently roll your head down and around from the back of one shoulder to the back of the other. The motion should never be forced or painful.  Repeat the motion 10-20 times, or until you feel the neck muscles relax and loosen. Repeat __________ times. Complete the exercise __________ times per day. STRENGTHENING EXERCISES - Cervical Strain and Sprain These exercises may help you when beginning to rehabilitate your injury. They may resolve your symptoms with or without further involvement from your physician, physical therapist, or athletic trainer. While completing these exercises, remember:   Muscles can gain both the endurance and the strength needed for everyday activities through controlled exercises.  Complete these exercises as instructed by your physician, physical therapist, or athletic trainer. Progress the resistance and repetitions only as guided.  You may experience muscle soreness or fatigue, but the pain or discomfort you are trying to eliminate should never worsen during these exercises. If this pain does worsen, stop and make certain you are following the directions exactly. If the pain is still present after adjustments, discontinue the exercise until you can discuss the trouble with your clinician. STRENGTH - Cervical Flexors, Isometric  Face a wall, standing about 6 inches away. Place a small pillow, a ball about 6-8 inches in diameter, or a folded towel between your forehead and the wall.  Slightly tuck your chin and gently push your forehead into the soft object. Push only with mild to moderate intensity, building up tension gradually. Keep your jaw and forehead relaxed.  Hold 10 to 20 seconds. Keep your breathing relaxed.  Release the tension slowly. Relax your  neck muscles completely before you start the next repetition. Repeat __________ times. Complete this exercise __________ times per day. STRENGTH- Cervical Lateral Flexors, Isometric   Stand about 6 inches away from a wall. Place a small pillow, a ball about 6-8 inches in diameter, or a folded towel between the side of your head and the wall.  Slightly tuck your chin and gently tilt your head into the soft object. Push only with mild to moderate intensity, building up tension gradually. Keep your jaw and forehead relaxed.  Hold 10 to 20 seconds. Keep your breathing relaxed.  Release the tension slowly. Relax your neck muscles completely before you start the next repetition. Repeat __________ times. Complete this exercise __________ times per day. STRENGTH - Cervical Extensors, Isometric   Stand about 6 inches away from a wall. Place a small pillow, a ball about 6-8 inches in diameter, or a folded towel between the back of your head and the wall.  Slightly tuck your chin and gently tilt your head back into the soft object. Push only with mild to moderate intensity, building up tension gradually. Keep your jaw and forehead relaxed.  Hold 10 to 20 seconds. Keep your breathing relaxed.  Release the tension slowly. Relax your neck  muscles completely before you start the next repetition. Repeat __________ times. Complete this exercise __________ times per day. POSTURE AND BODY MECHANICS CONSIDERATIONS - Cervical Strain and Sprain Keeping correct posture when sitting, standing or completing your activities will reduce the stress put on different body tissues, allowing injured tissues a chance to heal and limiting painful experiences. The following are general guidelines for improved posture. Your physician or physical therapist will provide you with any instructions specific to your needs. While reading these guidelines, remember:  The exercises prescribed by your provider will help you have the  flexibility and strength to maintain correct postures.  The correct posture provides the optimal environment for your joints to work. All of your joints have less wear and tear when properly supported by a spine with good posture. This means you will experience a healthier, less painful body.  Correct posture must be practiced with all of your activities, especially prolonged sitting and standing. Correct posture is as important when doing repetitive low-stress activities (typing) as it is when doing a single heavy-load activity (lifting). PROLONGED STANDING WHILE SLIGHTLY LEANING FORWARD When completing a task that requires you to lean forward while standing in one place for a long time, place either foot up on a stationary 2- to 4-inch high object to help maintain the best posture. When both feet are on the ground, the low back tends to lose its slight inward curve. If this curve flattens (or becomes too large), then the back and your other joints will experience too much stress, fatigue more quickly, and can cause pain.  RESTING POSITIONS Consider which positions are most painful for you when choosing a resting position. If you have pain with flexion-based activities (sitting, bending, stooping, squatting), choose a position that allows you to rest in a less flexed posture. You would want to avoid curling into a fetal position on your side. If your pain worsens with extension-based activities (prolonged standing, working overhead), avoid resting in an extended position such as sleeping on your stomach. Most people will find more comfort when they rest with their spine in a more neutral position, neither too rounded nor too arched. Lying on a non-sagging bed on your side with a pillow between your knees, or on your back with a pillow under your knees will often provide some relief. Keep in mind, being in any one position for a prolonged period of time, no matter how correct your posture, can still lead to  stiffness. WALKING Walk with an upright posture. Your ears, shoulders, and hips should all line up. OFFICE WORK When working at a desk, create an environment that supports good, upright posture. Without extra support, muscles fatigue and lead to excessive strain on joints and other tissues. CHAIR:  A chair should be able to slide under your desk when your back makes contact with the back of the chair. This allows you to work closely.  The chair's height should allow your eyes to be level with the upper part of your monitor and your hands to be slightly lower than your elbows.  Body position:  Your feet should make contact with the floor. If this is not possible, use a foot rest.  Keep your ears over your shoulders. This will reduce stress on your neck and low back.   This information is not intended to replace advice given to you by your health care provider. Make sure you discuss any questions you have with your health care provider.   Document Released:  08/14/2005 Document Revised: 09/04/2014 Document Reviewed: 11/26/2008 Elsevier Interactive Patient Education [employer].

## 2016-04-21 NOTE — Assessment & Plan Note (Signed)
Currently maintained on fenofibrate with occasional diarrhea and request to change medications. Triglycerides remain elevated thus a continued need. Hold fenofibrate and written prescription for [place] provided. Follow up pending trial of medication.

## 2016-04-21 NOTE — Progress Notes (Signed)
Subjective:    Patient ID: [name], female    DOB: 04/05/1946, 70 y.o.   MRN: [id number]  Chief Complaint  Patient presents with  . Follow-up    neck pain and catch in her back, talk about something other than fenofibrate, lofibra?    HPI:  [name] is a 70 y.o. female who  has a past medical history of Adenomatous colon polyp; Arthritis; Cataract; Heart murmur; Hyperlipidemia; Hypertension; and Thyroid disease. and presents today for an office visit.  1.) Neck pain - This is a new problem. Associated symptom of pain located in her neck has been going on for several months. Describes that she has decreased range of motion towards the right side. Modifying factors include aspirin which she indicates does help on occasion. She has also placed a heating pad which helps a little as well. No numbness or tingling. No trauma or injury. 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   2.) Medication - Currently maintained on fenofibrate. Reports taking medication as prescribed and denies significant adverse effects, however notes occasional diarrhea. Would like to try [place] as she has taken that in the past with good results.   Lab Results  Component Value Date   CHOL 251 (H) 04/12/2016   HDL 45.00 04/12/2016   LDLCALC 90 07/11/2007   LDLDIRECT 120.0 04/12/2016   TRIG 320.0 (H) 04/12/2016   CHOLHDL 6 04/12/2016     Allergies  Allergen Reactions  . Codeine Sulfate     "made me jumpy"  . Erythromycin     nausea      Outpatient Medications Prior to Visit  Medication Sig Dispense Refill  . acetaminophen (TYLENOL ARTHRITIS PAIN) 650 MG CR tablet Take 650 mg by mouth 4 (four) times daily as needed.     [name] amLODipine (NORVASC) 5 MG tablet TAKE 1 TABLET EVERY DAY 90 tablet 0  . aspirin 81 MG tablet Take 81 mg by mouth daily.      . benazepril (LOTENSIN) 20 MG tablet TAKE 1 AND 1/2 TABLET BY MOUTH EVERY DAY (Patient taking differently: TAKE 1 tablet  daily) 45 tablet 0  . Calcium Carbonate-Vitamin D (CALTRATE 600+D PO) Take by mouth daily.      . fenofibrate 160 MG tablet TAKE 1 TABLET BY MOUTH EVERY DAY 90 tablet 3  . levothyroxine (SYNTHROID, LEVOTHROID) 100 MCG tablet TAKE 1 TABLET BY MOUTH DAILY EXCEPT 1 & 1/2 TABLETS ON WEDNESDAYS 90 tablet 1  . LORazepam (ATIVAN) 1 MG tablet TAKE 1/2 TO 1 TABLET BY MOUTH EVERY 8 HOURS AS NEEDED 60 tablet 0  . metoprolol succinate (TOPROL-XL) 50 MG 24 hr tablet TAKE 1 TABLET BY MOUTH EVERY DAY TAKE WITH OR IMMEDIATELY AFTER A MEAL 90 tablet 2  . Multiple Vitamins-Minerals (CENTRUM PO) Take by mouth daily.      . potassium citrate (UROCIT-K 10) 10 MEQ (1080 MG) SR tablet Take 10 mEq by mouth daily.     [name] atorvastatin (LIPITOR) 20 MG tablet Take 1 tablet (20 mg total) by mouth daily at 6 PM. 90 tablet 0  . benazepril (LOTENSIN) 20 MG tablet TAKE 1 AND 1/2 TABLET BY MOUTH EVERY DAY 45 tablet 0   No facility-administered medications prior to visit.       Past Surgical History:  Procedure Laterality Date  . COLONOSCOPY W/ POLYPECTOMY  2003,2006,2013   [name]  . DILATION AND CURETTAGE OF UTERUS     X2  .  NEPHRECTOMY  2010   Left for 9.8cm benign complex cyst  . THYROIDECTOMY  2000   [name]  . TONSILLECTOMY    . TOTAL ABDOMINAL HYSTERECTOMY W/ BILATERAL SALPINGOOPHORECTOMY  2003   [name] for dysfunctional menses      Past Medical History:  Diagnosis Date  . Adenomatous colon polyp   . Arthritis   . Cataract   . Heart murmur    MVP  . Hyperlipidemia   . Hypertension   . Thyroid disease      Review of Systems  Constitutional: Negative for chills and fever.  Gastrointestinal: Positive for diarrhea (Occasional). Negative for constipation.  Musculoskeletal: Positive for neck pain and neck stiffness.  Neurological: Negative for weakness and numbness.      Objective:    BP (!) 162/90 (BP Location: Left Arm, Patient Position: Sitting, Cuff Size: Normal)   Pulse 78   Temp 98.3  F (36.8 C) (Oral)   Resp 16   Ht 5\' 6"  (1.676 m)   Wt 157 lb (71.2 kg)   SpO2 97%   BMI 25.34 kg/m  Nursing note and vital signs reviewed.  Physical Exam  Constitutional: She is oriented to person, place, and time. She appears well-developed and well-nourished. No distress.  Neck:  No obvious deformity, discoloration, or edema. Mild tenderness and muscle spasm of right upper trapezius muscle group. No deformity or crepitus appreciated. Range of motion limited in rotation and lateral bending in bilateral directions. Cervical compression test negative. Distal pulses and sensation are intact and appropriate.  Cardiovascular: Normal rate, regular rhythm, normal heart sounds and intact distal pulses.   Pulmonary/Chest: Effort normal and breath sounds normal.  Neurological: She is alert and oriented to person, place, and time.  Skin: Skin is warm and dry.  Psychiatric: She has a normal mood and affect. Her behavior is normal. Judgment and thought content normal.       Assessment & Plan:   Problem List Items Addressed This Visit      Musculoskeletal and Integument   Tightness of neck - Primary    Symptoms and exam consistent with cervical muscle tightness with no significant trauma or injury. Treat conservative with ice/moist heat, home exercise therapy and sample of Pennsaid provided. Follow up if symptoms worsen or do not improve.       Relevant Medications   Diclofenac Sodium (PENNSAID) 2 % SOLN     Other   Hypertriglyceridemia    Currently maintained on fenofibrate with occasional diarrhea and request to change medications. Triglycerides remain elevated thus a continued need. Hold fenofibrate and written prescription for [place] provided. Follow up pending trial of medication.       Relevant Medications   fenofibrate micronized (LOFIBRA) 134 MG capsule    Other Visit Diagnoses    Encounter for immunization       Relevant Medications   fenofibrate micronized (LOFIBRA) 134 MG  capsule   Other Relevant Orders   Flu vaccine HIGH DOSE PF (Completed)       I have discontinued [name]'s atorvastatin. I am also having her start on fenofibrate micronized and Diclofenac Sodium. Additionally, I am having her maintain her aspirin, Calcium Carbonate-Vitamin D (CALTRATE 600+D PO), Multiple Vitamins-Minerals (CENTRUM PO), potassium citrate, acetaminophen, levothyroxine, fenofibrate, LORazepam, metoprolol succinate, amLODipine, and benazepril.   Meds ordered this encounter  Medications  . fenofibrate micronized (LOFIBRA) 134 MG capsule    Sig: Take 1 capsule (134 mg total) by mouth daily before breakfast.    Dispense:  90 capsule    Refill:  0    Order Specific Question:   Supervising Provider    Answer:   Pricilla Holm A [id number]  . Diclofenac Sodium (PENNSAID) 2 % SOLN    Sig: Place 1 application onto the skin 2 (two) times daily as needed.    Dispense:  8 g    Refill:  0    Order Specific Question:   Supervising Provider    Answer:   Pricilla Holm A [id number]     Follow-up: Return in about 1 month (around 05/22/2016), or if symptoms worsen or fail to improve.  Mauricio Po, FNP

## 2016-04-21 NOTE — Assessment & Plan Note (Signed)
Symptoms and exam consistent with cervical muscle tightness with no significant trauma or injury. Treat conservative with ice/moist heat, home exercise therapy and sample of Pennsaid provided. Follow up if symptoms worsen or do not improve.

## 2016-05-04 ENCOUNTER — Other Ambulatory Visit: Payer: Self-pay | Admitting: Internal Medicine

## 2016-05-04 NOTE — Telephone Encounter (Signed)
Last refill was 03/07/16

## 2016-05-06 ENCOUNTER — Other Ambulatory Visit: Payer: Self-pay | Admitting: Family

## 2016-05-19 ENCOUNTER — Other Ambulatory Visit: Payer: Self-pay | Admitting: Family

## 2016-05-19 DIAGNOSIS — E890 Postprocedural hypothyroidism: Secondary | ICD-10-CM

## 2016-06-15 ENCOUNTER — Other Ambulatory Visit: Payer: Self-pay | Admitting: *Deleted

## 2016-06-15 DIAGNOSIS — E782 Mixed hyperlipidemia: Secondary | ICD-10-CM

## 2016-06-15 MED ORDER — BENAZEPRIL HCL 20 MG PO TABS: 30.0000 mg | ORAL_TABLET | Freq: Every day | ORAL | 1 refills | Status: DC

## 2016-06-15 MED ORDER — FENOFIBRATE MICRONIZED 134 MG PO CAPS: 134.0000 mg | ORAL_CAPSULE | Freq: Every day | ORAL | 1 refills | Status: DC

## 2016-06-23 ENCOUNTER — Other Ambulatory Visit: Payer: Self-pay | Admitting: Family

## 2016-06-23 NOTE — Telephone Encounter (Signed)
Last refill was 05/04/16

## 2016-07-03 ENCOUNTER — Other Ambulatory Visit: Payer: Self-pay | Admitting: Family

## 2016-08-16 ENCOUNTER — Other Ambulatory Visit: Payer: Self-pay | Admitting: Family

## 2016-08-22 ENCOUNTER — Ambulatory Visit (INDEPENDENT_AMBULATORY_CARE_PROVIDER_SITE_OTHER): Payer: Medicare Other | Admitting: Family

## 2016-08-22 ENCOUNTER — Encounter: Payer: Self-pay | Admitting: Family

## 2016-08-22 DIAGNOSIS — J324 Chronic pansinusitis: Secondary | ICD-10-CM | POA: Insufficient documentation

## 2016-08-22 DIAGNOSIS — J0140 Acute pansinusitis, unspecified: Secondary | ICD-10-CM

## 2016-08-22 MED ORDER — AMOXICILLIN-POT CLAVULANATE 875-125 MG PO TABS: 1.0000 | ORAL_TABLET | Freq: Two times a day (BID) | ORAL | 0 refills | Status: DC

## 2016-08-22 MED ORDER — LORAZEPAM 1 MG PO TABS: ORAL_TABLET | ORAL | 0 refills | Status: DC

## 2016-08-22 NOTE — Telephone Encounter (Signed)
Faxed script back to [facility].../lmb 

## 2016-08-22 NOTE — Patient Instructions (Signed)
Thank you for choosing [facility].  SUMMARY AND INSTRUCTIONS:  Medication:  Your prescription(s) have been submitted to your pharmacy or been printed and provided for you. Please take as directed and contact our office if you believe you are having problem(s) with the medication(s) or have any questions.   Follow up:  If your symptoms worsen or fail to improve, please contact our office for further instruction, or in case of emergency go directly to the emergency room at the closest medical facility.    General Recommendations:    Please drink plenty of fluids.  Get plenty of rest   Sleep in humidified air  Use saline nasal sprays  Netti pot   OTC Medications:  Decongestants - helps relieve congestion   Flonase (generic fluticasone) or Nasacort (generic triamcinolone) - please make sure to use the "cross-over" technique at a 45 degree angle towards the opposite eye as opposed to straight up the nasal passageway.   Sudafed (generic pseudoephedrine - Note this is the one that is available behind the pharmacy counter); Products with phenylephrine (-PE) may also be used but is often not as effective as pseudoephedrine.   If you have HIGH BLOOD PRESSURE - Coricidin HBP; AVOID any product that is -D as this contains pseudoephedrine which may increase your blood pressure.  Afrin (oxymetazoline) every 6-8 hours for up to 3 days.   Allergies - helps relieve runny nose, itchy eyes and sneezing   Claritin (generic loratidine), Allegra (fexofenidine), or Zyrtec (generic cyrterizine) for runny nose. These medications should not cause drowsiness.  Note - Benadryl (generic diphenhydramine) may be used however may cause drowsiness  Cough -   Delsym or Robitussin (generic dextromethorphan)  Expectorants - helps loosen mucus to ease removal   Mucinex (generic guaifenesin) as directed on the package.  Headaches / General Aches   Tylenol (generic acetaminophen) - DO  NOT EXCEED 3 grams (3,000 mg) in a 24 hour time period  Advil/Motrin (generic ibuprofen)   Sore Throat -   Salt water gargle   Chloraseptic (generic benzocaine) spray or lozenges / Sucrets (generic dyclonine)    Sinusitis Sinusitis is redness, soreness, and inflammation of the paranasal sinuses. Paranasal sinuses are air pockets within the bones of your face (beneath the eyes, the middle of the forehead, or above the eyes). In healthy paranasal sinuses, mucus is able to drain out, and air is able to circulate through them by way of your nose. However, when your paranasal sinuses are inflamed, mucus and air can become trapped. This can allow bacteria and other germs to grow and cause infection. Sinusitis can develop quickly and last only a short time (acute) or continue over a long period (chronic). Sinusitis that lasts for more than 12 weeks is considered chronic.  CAUSES  Causes of sinusitis include:  Allergies.  Structural abnormalities, such as displacement of the cartilage that separates your nostrils (deviated septum), which can decrease the air flow through your nose and sinuses and affect sinus drainage.  Functional abnormalities, such as when the small hairs (cilia) that line your sinuses and help remove mucus do not work properly or are not present. SIGNS AND SYMPTOMS  Symptoms of acute and chronic sinusitis are the same. The primary symptoms are pain and pressure around the affected sinuses. Other symptoms include:  Upper toothache.  Earache.  Headache.  Bad breath.  Decreased sense of smell and taste.  A cough, which worsens when you are lying flat.  Fatigue.  Fever.  Thick drainage   from your nose, which often is green and may contain pus (purulent).  Swelling and warmth over the affected sinuses. DIAGNOSIS  Your health care provider will perform a physical exam. During the exam, your health care provider may:  Look in your nose for signs of abnormal growths  in your nostrils (nasal polyps).  Tap over the affected sinus to check for signs of infection.  View the inside of your sinuses (endoscopy) using an imaging device that has a light attached (endoscope). If your health care provider suspects that you have chronic sinusitis, one or more of the following tests may be recommended:  Allergy tests.  Nasal culture. A sample of mucus is taken from your nose, sent to a lab, and screened for bacteria.  Nasal cytology. A sample of mucus is taken from your nose and examined by your health care provider to determine if your sinusitis is related to an allergy. TREATMENT  Most cases of acute sinusitis are related to a viral infection and will resolve on their own within 10 days. Sometimes medicines are prescribed to help relieve symptoms (pain medicine, decongestants, nasal steroid sprays, or saline sprays).  However, for sinusitis related to a bacterial infection, your health care provider will prescribe antibiotic medicines. These are medicines that will help kill the bacteria causing the infection.  Rarely, sinusitis is caused by a fungal infection. In theses cases, your health care provider will prescribe antifungal medicine. For some cases of chronic sinusitis, surgery is needed. Generally, these are cases in which sinusitis recurs more than 3 times per year, despite other treatments. HOME CARE INSTRUCTIONS   Drink plenty of water. Water helps thin the mucus so your sinuses can drain more easily.  Use a humidifier.  Inhale steam 3 to 4 times a day (for example, sit in the bathroom with the shower running).  Apply a warm, moist washcloth to your face 3 to 4 times a day, or as directed by your health care provider.  Use saline nasal sprays to help moisten and clean your sinuses.  Take medicines only as directed by your health care provider.  If you were prescribed either an antibiotic or antifungal medicine, finish it all even if you start to feel  better. SEEK IMMEDIATE MEDICAL CARE IF:  You have increasing pain or severe headaches.  You have nausea, vomiting, or drowsiness.  You have swelling around your face.  You have vision problems.  You have a stiff neck.  You have difficulty breathing. MAKE SURE YOU:   Understand these instructions.  Will watch your condition.  Will get help right away if you are not doing well or get worse. Document Released: 08/14/2005 Document Revised: 12/29/2013 Document Reviewed: 08/29/2011 ExitCare Patient Information 2015 [employer]. This information is not intended to replace advice given to you by your health care provider. Make sure you discuss any questions you have with your health care provider.   

## 2016-08-22 NOTE — Assessment & Plan Note (Signed)
Symptoms and exam consistent with sinusitis most likely bacterial given initial improvement and now worsening. Start Augmentin. Continue over-the-counter medications as needed for symptom relief and supportive care. Follow-up if symptoms worsen or do not improve.

## 2016-08-22 NOTE — Progress Notes (Signed)
Subjective:    Patient ID: [name], female    DOB: Jul 06, 1946, 70 y.o.   MRN: [id number]  Chief Complaint  Patient presents with  . Cough    x1 week, cough, sinus pressure and congestion    HPI:  [name] is a 70 y.o. female who  has a past medical history of Adenomatous colon polyp; Arthritis; Cataract; Heart murmur; Hyperlipidemia; Hypertension; and Thyroid disease. and presents today for an acute office visit.  This is a new problem. Associated symptoms of cough, sinus pressure and congestion have been going on for about 1 week. Denies fevers. Course of the symptoms have improved since initial onset but have worsened over the past several days. [facility] with [facility]itis and prescribed Tessalon for cough. Other modifying factors include Mucinex and Tylenol which helped with coughing up mucus. No recent antibiotics. Sick contacts include her grandchildren.   Allergies  Allergen Reactions  . Codeine Sulfate     "made me jumpy"  . Erythromycin     nausea      Outpatient Medications Prior to Visit  Medication Sig Dispense Refill  . acetaminophen (TYLENOL ARTHRITIS PAIN) 650 MG CR tablet Take 650 mg by mouth 4 (four) times daily as needed.     [name] amLODipine (NORVASC) 5 MG tablet TAKE 1 TABLET BY MOUTH EVERY DAY 90 tablet 1  . aspirin 81 MG tablet Take 81 mg by mouth daily.      [name] atorvastatin (LIPITOR) 20 MG tablet TAKE 1 TABLET BY MOUTH EVERY DAY AT 6PM 90 tablet 0  . benazepril (LOTENSIN) 20 MG tablet Take 1.5 tablets (30 mg total) by mouth daily. 135 tablet 1  . Calcium Carbonate-Vitamin D (CALTRATE 600+D PO) Take by mouth daily.      . fenofibrate 160 MG tablet TAKE 1 TABLET BY MOUTH EVERY DAY 90 tablet 3  . levothyroxine (SYNTHROID, LEVOTHROID) 100 MCG tablet TAKE 1 TABLET BY MOUTH EVERY DAY EXCEPT 1 AND 1/2 TABLETS ON WEDNESDAYS 90 tablet 1  . LORazepam (ATIVAN) 1 MG tablet TAKE 1/2 TO 1 TABLET BY MOUTH EVERY 8 HOURS AS  NEEDED 60 tablet 0  . metoprolol succinate (TOPROL-XL) 50 MG 24 hr tablet TAKE 1 TABLET BY MOUTH EVERY DAY TAKE WITH OR IMMEDIATELY AFTER A MEAL 90 tablet 2  . Multiple Vitamins-Minerals (CENTRUM PO) Take by mouth daily.      . potassium citrate (UROCIT-K 10) 10 MEQ (1080 MG) SR tablet Take 10 mEq by mouth daily.     . Diclofenac Sodium (PENNSAID) 2 % SOLN Place 1 application onto the skin 2 (two) times daily as needed. 8 g 0  . fenofibrate micronized (LOFIBRA) 134 MG capsule Take 1 capsule (134 mg total) by mouth daily before breakfast. 90 capsule 1   No facility-administered medications prior to visit.     Review of Systems  Constitutional: Negative for chills and fever.  HENT: Positive for congestion, sinus pain and sinus pressure. Negative for sore throat.   Respiratory: Positive for cough. Negative for chest tightness and shortness of breath.   Neurological: Positive for headaches.      Objective:    BP (!) 142/94 (BP Location: Left Arm, Patient Position: Sitting, Cuff Size: Normal)   Pulse 85   Temp 98.1 F (36.7 C) (Oral)   Resp 16   Ht 5\' 6"  (1.676 m)   Wt 154 lb 12.8 oz (70.2 kg)   SpO2 97%   BMI 24.99 kg/m  Nursing note and vital signs reviewed.  Physical Exam  Constitutional: She is oriented to person, place, and time. She appears well-developed and well-nourished.  HENT:  Right Ear: Hearing, tympanic membrane, external ear and ear canal normal.  Left Ear: Hearing, tympanic membrane, external ear and ear canal normal.  Nose: Right sinus exhibits maxillary sinus tenderness and frontal sinus tenderness. Left sinus exhibits maxillary sinus tenderness and frontal sinus tenderness.  Mouth/Throat: Uvula is midline, oropharynx is clear and moist and mucous membranes are normal.  Neck: Neck supple.  Cardiovascular: Normal rate, regular rhythm, normal heart sounds and intact distal pulses.   Pulmonary/Chest: Effort normal and breath sounds normal.  Neurological: She is  alert and oriented to person, place, and time.  Skin: Skin is warm and dry.       Assessment & Plan:   Problem List Items Addressed This Visit      Respiratory   Pansinusitis    Symptoms and exam consistent with sinusitis most likely bacterial given initial improvement and now worsening. Start Augmentin. Continue over-the-counter medications as needed for symptom relief and supportive care. Follow-up if symptoms worsen or do not improve.      Relevant Medications   amoxicillin-clavulanate (AUGMENTIN) 875-125 MG tablet       I have discontinued Ms. Robinson's Diclofenac Sodium and fenofibrate micronized. I am also having her start on amoxicillin-clavulanate. Additionally, I am having her maintain her aspirin, Calcium Carbonate-Vitamin D (CALTRATE 600+D PO), Multiple Vitamins-Minerals (CENTRUM PO), potassium citrate, acetaminophen, fenofibrate, metoprolol succinate, levothyroxine, benazepril, amLODipine, atorvastatin, and LORazepam.   Meds ordered this encounter  Medications  . amoxicillin-clavulanate (AUGMENTIN) 875-125 MG tablet    Sig: Take 1 tablet by mouth 2 (two) times daily.    Dispense:  14 tablet    Refill:  0    Order Specific Question:   Supervising Provider    Answer:   Pricilla Holm A [id number]     Follow-up: Return if symptoms worsen or fail to improve.  Mauricio Po, FNP

## 2016-10-02 ENCOUNTER — Telehealth: Payer: Self-pay | Admitting: Family

## 2016-10-02 MED ORDER — OSELTAMIVIR PHOSPHATE 75 MG PO CAPS: 75.0000 mg | ORAL_CAPSULE | Freq: Every day | ORAL | 0 refills | Status: DC

## 2016-10-02 NOTE — Telephone Encounter (Signed)
Medication sent to pharmacy  

## 2016-10-02 NOTE — Telephone Encounter (Signed)
Patient is with mother in [facility] who has Flu A.  MD at hospital suggest she get tamiflu.  Patient requesting this to be sent to [facility] at [facility].

## 2016-10-13 ENCOUNTER — Other Ambulatory Visit: Payer: Self-pay | Admitting: Family

## 2016-10-13 NOTE — Telephone Encounter (Signed)
Last refill was 08/22/16

## 2016-10-25 ENCOUNTER — Other Ambulatory Visit: Payer: Self-pay | Admitting: Family

## 2016-10-25 DIAGNOSIS — E890 Postprocedural hypothyroidism: Secondary | ICD-10-CM

## 2016-11-13 ENCOUNTER — Other Ambulatory Visit: Payer: Self-pay | Admitting: Family

## 2016-11-22 ENCOUNTER — Encounter: Payer: Self-pay | Admitting: Gastroenterology

## 2016-11-28 ENCOUNTER — Other Ambulatory Visit: Payer: Self-pay

## 2016-11-28 MED ORDER — LORAZEPAM 1 MG PO TABS: ORAL_TABLET | ORAL | 0 refills | Status: DC

## 2016-12-06 ENCOUNTER — Other Ambulatory Visit: Payer: Self-pay | Admitting: Family

## 2016-12-06 DIAGNOSIS — Z1231 Encounter for screening mammogram for malignant neoplasm of breast: Secondary | ICD-10-CM

## 2016-12-13 ENCOUNTER — Other Ambulatory Visit: Payer: Self-pay | Admitting: Family

## 2016-12-13 DIAGNOSIS — I10 Essential (primary) hypertension: Secondary | ICD-10-CM

## 2016-12-27 ENCOUNTER — Ambulatory Visit
Admission: RE | Admit: 2016-12-27 | Discharge: 2016-12-27 | Disposition: A | Discharge status: Home / Self Care | Payer: Medicare Other | Source: Ambulatory Visit | Attending: Family | Admitting: Family

## 2016-12-27 DIAGNOSIS — Z1231 Encounter for screening mammogram for malignant neoplasm of breast: Secondary | ICD-10-CM

## 2017-01-01 ENCOUNTER — Other Ambulatory Visit: Payer: Self-pay | Admitting: Family

## 2017-01-03 ENCOUNTER — Encounter: Payer: Self-pay | Admitting: Gynecology

## 2017-01-03 ENCOUNTER — Ambulatory Visit (INDEPENDENT_AMBULATORY_CARE_PROVIDER_SITE_OTHER): Payer: Medicare Other | Admitting: Gynecology

## 2017-01-03 VITALS — BP 148/92 | Ht 65.25 in | Wt 155.4 lb

## 2017-01-03 DIAGNOSIS — M8580 Other specified disorders of bone density and structure, unspecified site: Secondary | ICD-10-CM

## 2017-01-03 DIAGNOSIS — N952 Postmenopausal atrophic vaginitis: Secondary | ICD-10-CM

## 2017-01-03 DIAGNOSIS — Z01419 Encounter for gynecological examination (general) (routine) without abnormal findings: Secondary | ICD-10-CM

## 2017-01-03 MED ORDER — ESTRADIOL 10 MCG VA TABS: 1.0000 | ORAL_TABLET | VAGINAL | 11 refills | Status: DC

## 2017-01-03 NOTE — Progress Notes (Signed)
[name] 1945/10/17 [id number]   History:    71 y.o.  for annual gyn exam who has not been seen since 2016. Patient complains at times of vaginal dryness and some decreased libido.Patient denies any prior history of abnormal Pap smear.Patient with prior history of supracervical hysterectomy with bilateral salpingo-oophorectomy.Dr. Colon has been her primary physician who is been monitoring her for her hypertension and hypothyroidism as well as her osteopenia. No labs will be drawn today. Patient has history in the past of a left nephrectomy as a result of a large 9.8 cm benign renal cysts. She's currently being followed by the nephrologist Dr. Alinda Money once a year. Currently being followed for a right renal stone. Patient's last colonoscopy was in 2013 and had polyps removed the same is in 2003 in 2006 respectively. Patient has not had a bone density study in 2016 in the left one third distal forearm had a T score -2.4 and base and her hip score she had a normal Frax analysis.Patient also has hypothyroidism as a result of total thyroidectomy for benign thyroid pathology for which I do not have the report.   Past medical history,surgical history, family history and social history were all reviewed and documented in the EPIC chart.  Gynecologic History No LMP recorded. Patient has had a hysterectomy. Contraception: status post hysterectomy Last Pap: 2015. Results were: normal Last mammogram: 2018. Results were: normal  Obstetric History OB History  Gravida Para Term Preterm AB Living  2 2 2     2   SAB TAB Ectopic Multiple Live Births          2    # Outcome Date GA Lbr Len/2nd Weight Sex Delivery Anes PTL Lv  2 Term     M Vag-Spont  N LIV  1 Term     F Vag-Spont  N LIV       ROS: A ROS was performed and pertinent positives and negatives are included in the history.  GENERAL: No fevers or chills. HEENT: No change in vision, no earache, sore throat or sinus congestion. NECK: No pain  or stiffness. CARDIOVASCULAR: No chest pain or pressure. No palpitations. PULMONARY: No shortness of breath, cough or wheeze. GASTROINTESTINAL: No abdominal pain, nausea, vomiting or diarrhea, melena or bright red blood per rectum. GENITOURINARY: No urinary frequency, urgency, hesitancy or dysuria. MUSCULOSKELETAL: No joint or muscle pain, no back pain, no recent trauma. DERMATOLOGIC: No rash, no itching, no lesions. ENDOCRINE: No polyuria, polydipsia, no heat or cold intolerance. No recent change in weight. HEMATOLOGICAL: No anemia or easy bruising or bleeding. NEUROLOGIC: No headache, seizures, numbness, tingling or weakness. PSYCHIATRIC: No depression, no loss of interest in normal activity or change in sleep pattern.     Exam: chaperone present  BP (!) 148/92   Ht 5' 5.25" (1.657 m)   Wt 155 lb 6.4 oz (70.5 kg)   BMI 25.66 kg/m   Body mass index is 25.66 kg/m.  General appearance : Well developed well nourished female. No acute distress HEENT: Eyes: no retinal hemorrhage or exudates,  Neck supple, trachea midline, no carotid bruits, no thyroidmegaly Lungs: Clear to auscultation, no rhonchi or wheezes, or rib retractions  Heart: Regular rate and rhythm, no murmurs or gallops Breast:Examined in sitting and supine position were symmetrical in appearance, no palpable masses or tenderness,  no skin retraction, no nipple inversion, no nipple discharge, no skin discoloration, no axillary or supraclavicular lymphadenopathy Abdomen: no palpable masses or tenderness, no rebound or  guarding Extremities: no edema or skin discoloration or tenderness  Pelvic:  Bartholin, Urethra, Skene Glands: Within normal limits             Vagina: No gross lesions or discharge  Cervix: No gross lesions or discharge  Uterus  absent  Adnexa  Without masses or tenderness  Anus and perineum  normal   Rectovaginal  normal sphincter tone without palpated masses or tenderness             Hemoccult PCP provides      Assessment/Plan:  71 y.o. female for annual exam with vaginal atrophy will be prescribed Vagifem 10 g to apply intravaginally twice a week. Pap smear no longer indicated. Patient has a follow-up appointment with her PCP in the next few months and he has been ordering her bone density study. She has osteopenia with normal Frax analysis on scan 2 years ago. We discussed importance of calcium vitamin D and weightbearing exercises for osteoporosis prevention.   Terrance Mass MD, 11:39 AM 01/03/2017

## 2017-01-03 NOTE — Patient Instructions (Signed)
Estradiol vaginal tablets What is this medicine? ESTRADIOL (es tra DYE ole) vaginal tablet is used to help relieve symptoms of vaginal irritation and dryness that occurs in some women during menopause. This medicine may be used for other purposes; ask your health care provider or pharmacist if you have questions. COMMON BRAND NAME(S): Vagifem, Yuvafem What should I tell my health care provider before I take this medicine? They need to know if you have any of these conditions: -abnormal vaginal bleeding -blood vessel disease or blood clots -breast, cervical, endometrial, ovarian, liver, or uterine cancer -dementia -diabetes -gallbladder disease -heart disease or recent heart attack -high blood pressure -high cholesterol -high level of calcium in the blood -hysterectomy -kidney disease -liver disease -migraine headaches -protein C deficiency -protein S deficiency -stroke -systemic lupus erythematosus (SLE) -tobacco smoker -an unusual or allergic reaction to estrogens, other hormones, medicines, foods, dyes, or preservatives -pregnant or trying to get pregnant -breast-feeding How should I use this medicine? This medicine is only for use in the vagina. Do not take by mouth. Wash and dry your hands before and after use. Read package directions carefully. Unwrap the applicator package. Be sure to use a new applicator for each dose. Use at the same time each day. If the tablet has fallen out of the applicator, but is still in the package, carefully place it back into the applicator. If the tablet has fallen out of the package, that applicator should be thrown out and you should use a new applicator containing a new tablet. Lie on your back, part and bend your knees. Gently insert the applicator as far as comfortably possible into the vagina. Then, gently press the plunger until the plunger is fully depressed. This will release the tablet into the vagina. Gently remove the applicator. Throw  away the applicator after use. Do not use your medicine more often than directed. Do not stop using except on the advice of your doctor or health care professional. Talk to your pediatrician regarding the use of this medicine in children. This medicine is not approved for use in children. A patient package insert for the product will be given with each prescription and refill. Read this sheet carefully each time. The sheet may change frequently. Overdosage: If you think you have taken too much of this medicine contact a poison control center or emergency room at once. NOTE: This medicine is only for you. Do not share this medicine with others. What if I miss a dose? If you miss a dose, take it as soon as you can. If it is almost time for your next dose, take only that dose. Do not take double or extra doses. What may interact with this medicine? Do not take this medicine with any of the following medications: -aromatase inhibitors like aminoglutethimide, anastrozole, exemestane, letrozole, testolactone This medicine may also interact with the following medications: -antibiotics used to treat tuberculosis like rifabutin, rifampin and rifapentene -raloxifene or tamoxifen -warfarin This list may not describe all possible interactions. Give your health care provider a list of all the medicines, herbs, non-prescription drugs, or dietary supplements you use. Also tell them if you smoke, drink alcohol, or use illegal drugs. Some items may interact with your medicine. What should I watch for while using this medicine? Visit your health care professional for regular checks on your progress. You will need a regular breast and pelvic exam. You should also discuss the need for regular mammograms with your health care professional, and follow his or her  guidelines. This medicine can make your body retain fluid, making your fingers, hands, or ankles swell. Your blood pressure can go up. Contact your doctor or  health care professional if you feel you are retaining fluid. If you have any reason to think you are pregnant; stop taking this medicine at once and contact your doctor or health care professional. Tobacco smoking increases the risk of getting a blood clot or having a stroke, especially if you are more than 71 years old. You are strongly advised not to smoke. If you wear contact lenses and notice visual changes, or if the lenses begin to feel uncomfortable, consult your eye care specialist. If you are going to have elective surgery, you may need to stop taking this medicine beforehand. Consult your health care professional for advice prior to scheduling the surgery. What side effects may I notice from receiving this medicine? Side effects that you should report to your doctor or health care professional as soon as possible: -allergic reactions like skin rash, itching or hives, swelling of the face, lips, or tongue -breast tissue changes or discharge -changes in vision -chest pain -confusion, trouble speaking or understanding -dark urine -general ill feeling or flu-like symptoms -light-colored stools -nausea, vomiting -pain, swelling, warmth in the leg -right upper belly pain -severe headaches -shortness of breath -sudden numbness or weakness of the face, arm or leg -trouble walking, dizziness, loss of balance or coordination -unusual vaginal bleeding -yellowing of the eyes or skin Side effects that usually do not require medical attention (report to your doctor or health care professional if they continue or are bothersome): -hair loss -increased hunger or thirst -increased urination -symptoms of vaginal infection like itching, irritation or unusual discharge -unusually weak or tired This list may not describe all possible side effects. Call your doctor for medical advice about side effects. You may report side effects to FDA at 1-800-FDA-1088. Where should I keep my medicine? Keep  out of the reach of children. Store at room temperature between 15 and 30 degrees C (59 and 86 degrees F). Throw away any unused medicine after the expiration date. NOTE: This sheet is a summary. It may not cover all possible information. If you have questions about this medicine, talk to your doctor, pharmacist, or health care provider.  2018 Elsevier/Gold Standard (2014-07-29 09:22:51)  

## 2017-01-10 ENCOUNTER — Encounter: Payer: Self-pay | Admitting: Gynecology

## 2017-01-17 ENCOUNTER — Other Ambulatory Visit: Payer: Self-pay

## 2017-01-17 MED ORDER — LORAZEPAM 1 MG PO TABS: ORAL_TABLET | ORAL | 1 refills | Status: DC

## 2017-01-17 NOTE — Progress Notes (Signed)
Medication refilled

## 2017-02-02 ENCOUNTER — Ambulatory Visit (INDEPENDENT_AMBULATORY_CARE_PROVIDER_SITE_OTHER): Payer: Medicare Other | Admitting: Family

## 2017-02-02 ENCOUNTER — Encounter: Payer: Self-pay | Admitting: Family

## 2017-02-02 VITALS — BP 148/92 | HR 83 | Temp 98.4°F | Resp 16 | Ht 65.25 in | Wt 156.0 lb

## 2017-02-02 DIAGNOSIS — Z0000 Encounter for general adult medical examination without abnormal findings: Secondary | ICD-10-CM | POA: Diagnosis not present

## 2017-02-02 DIAGNOSIS — I10 Essential (primary) hypertension: Secondary | ICD-10-CM

## 2017-02-02 DIAGNOSIS — E782 Mixed hyperlipidemia: Secondary | ICD-10-CM

## 2017-02-02 NOTE — Assessment & Plan Note (Signed)
Reviewed and updated patient's medical, surgical, family and social history. Medications and allergies were also reviewed. Basic screenings for depression, activities of daily living, hearing, cognition and safety were performed. Provider list was updated and health plan was provided to the patient.  

## 2017-02-02 NOTE — Assessment & Plan Note (Signed)
Lipid profile obtained today. Continue current dosage of atorvastatin pending lipid profile results.

## 2017-02-02 NOTE — Assessment & Plan Note (Signed)
1) Anticipatory Guidance: Discussed importance of wearing a seatbelt while driving and not texting while driving; changing batteries in smoke detector at least once annually; wearing suntan lotion when outside; eating a balanced and moderate diet; getting physical activity at least 30 minutes per day.  2) Immunizations / Screenings / Labs:  All immunizations are up to date per recommendations. Breast cancer screening and DEXA are up to date per recommendations. Due for colon cancer screening with patient follow up with gastroenterology. All other screenings are up to date per recommendations. Obtain CBC, CMET, and lipid profile.    Overall well exam with risk factors for cardiovascular disease including hypertension and dyslipidemia. Chronic conditions periodically controlled with current medication regimen and no adverse side effects. Continue healthy lifestyle behaviors and choices. Follow-up prevention exam in 1 year. Follow-up office visit pending blood work and for chronic conditions.

## 2017-02-02 NOTE — Assessment & Plan Note (Signed)
Blood pressure slightly elevated above goal 140/90 with home blood pressure readings below goal. No adverse side effects or hypotensive readings. Continue current dosage of amlodipine, benazepril, and metoprolol. Encouraged monitor blood pressure at home and follow sodium diet.

## 2017-02-02 NOTE — Patient Instructions (Addendum)
Thank you for choosing [facility].  SUMMARY AND INSTRUCTIONS:  Please continue to take your medication at home.  Continue to monitor your blood pressure at home.    Labs:  Please stop by the lab on the lower level of the building for your blood work. Your results will be released to [name] (or called to you) after review, usually within 72 hours after test completion. If any changes need to be made, you will be notified at that same time.  1.) The lab is open from 7:30am to 5:30 pm Monday-Friday 2.) No appointment is necessary 3.) Fasting (if needed) is 6-8 hours after food and drink; black coffee and water are okay   Follow up:  If your symptoms worsen or fail to improve, please contact our office for further instruction, or in case of emergency go directly to the emergency room at the closest medical facility.    Health Maintenance  Topic Date Due  . COLONOSCOPY  01/03/2017  . INFLUENZA VACCINE  03/28/2017  . MAMMOGRAM  12/28/2018  . TETANUS/TDAP  02/06/2023  . DEXA SCAN  Completed  . Hepatitis C Screening  Completed  . PNA vac Low Risk Adult  Completed     Health Maintenance for Postmenopausal Women Menopause is a normal process in which your reproductive ability comes to an end. This process happens gradually over a span of months to years, usually between the ages of 58 and 33. Menopause is complete when you have missed 12 consecutive menstrual periods. It is important to talk with your health care provider about some of the most common conditions that affect postmenopausal women, such as heart disease, cancer, and bone loss (osteoporosis). Adopting a healthy lifestyle and getting preventive care can help to promote your health and wellness. Those actions can also lower your chances of developing some of these common conditions. What should I know about menopause? During menopause, you may experience a number of symptoms, such as:  Moderate-to-severe hot  flashes.  Night sweats.  Decrease in sex drive.  Mood swings.  Headaches.  Tiredness.  Irritability.  Memory problems.  Insomnia.  Choosing to treat or not to treat menopausal changes is an individual decision that you make with your health care provider. What should I know about hormone replacement therapy and supplements? Hormone therapy products are effective for treating symptoms that are associated with menopause, such as hot flashes and night sweats. Hormone replacement carries certain risks, especially as you become older. If you are thinking about using estrogen or estrogen with progestin treatments, discuss the benefits and risks with your health care provider. What should I know about heart disease and stroke? Heart disease, heart attack, and stroke become more likely as you age. This may be due, in part, to the hormonal changes that your body experiences during menopause. These can affect how your body processes dietary fats, triglycerides, and cholesterol. Heart attack and stroke are both medical emergencies. There are many things that you can do to help prevent heart disease and stroke:  Have your blood pressure checked at least every 1-2 years. High blood pressure causes heart disease and increases the risk of stroke.  If you are 79-52 years old, ask your health care provider if you should take aspirin to prevent a heart attack or a stroke.  Do not use any tobacco products, including cigarettes, chewing tobacco, or electronic cigarettes. If you need help quitting, ask your health care provider.  It is important to eat a healthy diet  and maintain a healthy weight. ? Be sure to include plenty of vegetables, fruits, low-fat dairy products, and lean protein. ? Avoid eating foods that are high in solid fats, added sugars, or salt (sodium).  Get regular exercise. This is one of the most important things that you can do for your health. ? Try to exercise for at least 150  minutes each week. The type of exercise that you do should increase your heart rate and make you sweat. This is known as moderate-intensity exercise. ? Try to do strengthening exercises at least twice each week. Do these in addition to the moderate-intensity exercise.  Know your numbers.Ask your health care provider to check your cholesterol and your blood glucose. Continue to have your blood tested as directed by your health care provider.  What should I know about cancer screening? There are several types of cancer. Take the following steps to reduce your risk and to catch any cancer development as early as possible. Breast Cancer  Practice breast self-awareness. ? This means understanding how your breasts normally appear and feel. ? It also means doing regular breast self-exams. Let your health care provider know about any changes, no matter how small.  If you are 93 or older, have a clinician do a breast exam (clinical breast exam or CBE) every year. Depending on your age, family history, and medical history, it may be recommended that you also have a yearly breast X-ray (mammogram).  If you have a family history of breast cancer, talk with your health care provider about genetic screening.  If you are at high risk for breast cancer, talk with your health care provider about having an MRI and a mammogram every year.  Breast cancer (BRCA) gene test is recommended for women who have family members with BRCA-related cancers. Results of the assessment will determine the need for genetic counseling and BRCA1 and for BRCA2 testing. BRCA-related cancers include these types: ? Breast. This occurs in males or females. ? Ovarian. ? Tubal. This may also be called fallopian tube cancer. ? Cancer of the abdominal or pelvic lining (peritoneal cancer). ? Prostate. ? Pancreatic.  Cervical, Uterine, and Ovarian Cancer Your health care provider may recommend that you be screened regularly for cancer  of the pelvic organs. These include your ovaries, uterus, and vagina. This screening involves a pelvic exam, which includes checking for microscopic changes to the surface of your cervix (Pap test).  For women ages 21-65, health care providers may recommend a pelvic exam and a Pap test every three years. For women ages 34-65, they may recommend the Pap test and pelvic exam, combined with testing for human papilloma virus (HPV), every five years. Some types of HPV increase your risk of cervical cancer. Testing for HPV may also be done on women of any age who have unclear Pap test results.  Other health care providers may not recommend any screening for nonpregnant women who are considered low risk for pelvic cancer and have no symptoms. Ask your health care provider if a screening pelvic exam is right for you.  If you have had past treatment for cervical cancer or a condition that could lead to cancer, you need Pap tests and screening for cancer for at least 20 years after your treatment. If Pap tests have been discontinued for you, your risk factors (such as having a new sexual partner) need to be reassessed to determine if you should start having screenings again. Some women have medical problems that increase  the chance of getting cervical cancer. In these cases, your health care provider may recommend that you have screening and Pap tests more often.  If you have a family history of uterine cancer or ovarian cancer, talk with your health care provider about genetic screening.  If you have vaginal bleeding after reaching menopause, tell your health care provider.  There are currently no reliable tests available to screen for ovarian cancer.  Lung Cancer Lung cancer screening is recommended for adults 62-28 years old who are at high risk for lung cancer because of a history of smoking. A yearly low-dose CT scan of the lungs is recommended if you:  Currently smoke.  Have a history of at least 30  pack-years of smoking and you currently smoke or have quit within the past 15 years. A pack-year is smoking an average of one pack of cigarettes per day for one year.  Yearly screening should:  Continue until it has been 15 years since you quit.  Stop if you develop a health problem that would prevent you from having lung cancer treatment.  Colorectal Cancer  This type of cancer can be detected and can often be prevented.  Routine colorectal cancer screening usually begins at age 69 and continues through age 52.  If you have risk factors for colon cancer, your health care provider may recommend that you be screened at an earlier age.  If you have a family history of colorectal cancer, talk with your health care provider about genetic screening.  Your health care provider may also recommend using home test kits to check for hidden blood in your stool.  A small camera at the end of a tube can be used to examine your colon directly (sigmoidoscopy or colonoscopy). This is done to check for the earliest forms of colorectal cancer.  Direct examination of the colon should be repeated every 5-10 years until age 57. However, if early forms of precancerous polyps or small growths are found or if you have a family history or genetic risk for colorectal cancer, you may need to be screened more often.  Skin Cancer  Check your skin from head to toe regularly.  Monitor any moles. Be sure to tell your health care provider: ? About any new moles or changes in moles, especially if there is a change in a mole's shape or color. ? If you have a mole that is larger than the size of a pencil eraser.  If any of your family members has a history of skin cancer, especially at a young age, talk with your health care provider about genetic screening.  Always use sunscreen. Apply sunscreen liberally and repeatedly throughout the day.  Whenever you are outside, protect yourself by wearing long sleeves, pants,  a wide-brimmed hat, and sunglasses.  What should I know about osteoporosis? Osteoporosis is a condition in which bone destruction happens more quickly than new bone creation. After menopause, you may be at an increased risk for osteoporosis. To help prevent osteoporosis or the bone fractures that can happen because of osteoporosis, the following is recommended:  If you are 43-62 years old, get at least 1,000 mg of calcium and at least 600 mg of vitamin D per day.  If you are older than age 18 but younger than age 83, get at least 1,200 mg of calcium and at least 600 mg of vitamin D per day.  If you are older than age 68, get at least 1,200 mg of calcium and  at least 800 mg of vitamin D per day.  Smoking and excessive alcohol intake increase the risk of osteoporosis. Eat foods that are rich in calcium and vitamin D, and do weight-bearing exercises several times each week as directed by your health care provider. What should I know about how menopause affects my mental health? Depression may occur at any age, but it is more common as you become older. Common symptoms of depression include:  Low or sad mood.  Changes in sleep patterns.  Changes in appetite or eating patterns.  Feeling an overall lack of motivation or enjoyment of activities that you previously enjoyed.  Frequent crying spells.  Talk with your health care provider if you think that you are experiencing depression. What should I know about immunizations? It is important that you get and maintain your immunizations. These include:  Tetanus, diphtheria, and pertussis (Tdap) booster vaccine.  Influenza every year before the flu season begins.  Pneumonia vaccine.  Shingles vaccine.  Your health care provider may also recommend other immunizations. This information is not intended to replace advice given to you by your health care provider. Make sure you discuss any questions you have with your health care  provider. Document Released: 10/06/2005 Document Revised: 03/03/2016 Document Reviewed: 05/18/2015 Elsevier Interactive Patient Education  2018 [employer].

## 2017-02-02 NOTE — Progress Notes (Signed)
Subjective:    Patient ID: [name], female    DOB: 1945/09/22, 71 y.o.   MRN: [id number]  Chief Complaint  Patient presents with  . CPE    not fasting    HPI:  [name] is a 71 y.o. female who presents today for a Medicare Annual Wellness/Physical exam.     1) Health Maintenance -   Diet - Averages about 3 meals per day consisting of a regular diet ; Caffeine intake of 1-2 cups per day.   Exercise - Continues to chase the kids with no structured exercise  2) Preventative Exams / Immunizations:  Dental -- Up to date  Vision -- Up to date   Health Maintenance  Topic Date Due  . COLONOSCOPY  01/03/2017  . INFLUENZA VACCINE  03/28/2017  . MAMMOGRAM  12/28/2018  . TETANUS/TDAP  02/06/2023  . DEXA SCAN  Completed  . Hepatitis C Screening  Completed  . PNA vac Low Risk Adult  Completed     Immunization History  Administered Date(s) Administered  . Influenza Split 06/21/2011, 06/12/2012  . Influenza Whole 07/11/2007, 06/10/2008, 06/29/2010  . Influenza, High Dose Seasonal PF 06/18/2013, 04/21/2016  . Influenza,inj,Quad PF,36+ Mos 06/10/2014, 05/07/2015  . Pneumococcal Conjugate-13 11/17/2015  . Pneumococcal Polysaccharide-23 09/17/2013  . Td 01/05/2003  . Tdap 02/05/2013  . Zoster 11/10/2011    RISK FACTORS  Tobacco History  Smoking Status  . Former Smoker  . Packs/day: 0.50  . Years: 4.00  . Quit date: 08/29/1975  Smokeless Tobacco  . Never Used    Comment: smoked 1973-1977, up to 1/2 ppd     Cardiac risk factors: advanced age (older than 51 for men, 41 for women), dyslipidemia and hypertension.  Depression Screen  Depression screen [facility] 2/9 02/02/2017  Decreased Interest 0  Down, Depressed, Hopeless 0  PHQ - 2 Score 0     Activities of Daily Living In your present state of health, do you have any difficulty performing the following activities?:  Driving? No Managing money?  No Feeding yourself? No Getting from bed to chair?  No Climbing a flight of stairs? No Preparing food and eating?: No Bathing or showering? No Getting dressed: No Getting to the toilet? No Using the toilet: No Moving around from place to place: No In the past year have you fallen or had a near fall?:No   Home Safety Has smoke detector and wears seat belts. No firearms. No excess sun exposure. Are there smokers in your home (other than you)?  No Do you feel safe at home?  Yes  Hearing Difficulties: No Do you often ask people to speak up or repeat themselves? No Do you experience ringing or noises in your ears? No  Do you have difficulty understanding soft or whispered voices? No    Cognitive Testing  Alert? Yes   Normal Appearance? Yes  Oriented to person? Yes  Place? Yes   Time? Yes  Recall of three objects?  Yes  Can perform simple calculations? Yes  Displays appropriate judgment? Yes  Can read the correct time from a watch face? Yes  Do you feel that you have a problem with memory? No  Do you often misplace items? No   Advanced Directives have been discussed with the patient? Yes   Current Physicians/Providers and Suppliers  1. Terri Piedra, FNP - Internal Medicine 2. Uvaldo Rising, MD - Gynecology  Indicate any recent Medical Services you may have received from other than [facility] providers  in the past year (date may be approximate).  All answers were reviewed with the patient and necessary referrals were made:  Mauricio Po, [place]   02/02/2017    Allergies  Allergen Reactions  . Codeine Sulfate     "made me jumpy"  . Erythromycin     nausea     Outpatient Medications Prior to Visit  Medication Sig Dispense Refill  . acetaminophen (TYLENOL ARTHRITIS PAIN) 650 MG CR tablet Take 650 mg by mouth 4 (four) times daily as needed.     [name] amLODipine (NORVASC) 5 MG tablet TAKE 1 TABLET BY MOUTH EVERY DAY 90 tablet 1  . aspirin 81 MG tablet Take 81 mg by mouth daily.      [name] atorvastatin (LIPITOR) 20 MG tablet TAKE 1  TABLET BY MOUTH EVERY DAY AT 6PM 90 tablet 0  . benazepril (LOTENSIN) 20 MG tablet Take 1.5 tablets (30 mg total) by mouth daily. 135 tablet 1  . Calcium Carbonate-Vitamin D (CALTRATE 600+D PO) Take by mouth daily.      . fenofibrate 160 MG tablet TAKE 1 TABLET BY MOUTH EVERY DAY 90 tablet 3  . levothyroxine (SYNTHROID, LEVOTHROID) 100 MCG tablet TAKE 1 TABLET BY MOUTH EVERY DAY EXECPT 1 AND 1/2 TABLETS ON WEDNESDAYS 90 tablet 1  . LORazepam (ATIVAN) 1 MG tablet TAKE 1/2 TO 1 TABLET BY MOUTH EVERY 8 HOURS AS NEEDED 60 tablet 1  . metoprolol succinate (TOPROL-XL) 50 MG 24 hr tablet Take 1 tablet (50 mg total) by mouth daily. Yearly physical due in May must see greg for future refills 60 tablet 0  . Multiple Vitamins-Minerals (CENTRUM PO) Take by mouth daily.      . potassium citrate (UROCIT-K 10) 10 MEQ (1080 MG) SR tablet Take 10 mEq by mouth daily.     . Estradiol 10 MCG TABS vaginal tablet Place 1 tablet (10 mcg total) vaginally 2 (two) times a week. 8 tablet 11   No facility-administered medications prior to visit.      Past Medical History:  Diagnosis Date  . Adenomatous colon polyp   . Arthritis   . Cataract   . Heart murmur    MVP  . Hyperlipidemia   . Hypertension   . Thyroid disease      Past Surgical History:  Procedure Laterality Date  . BREAST EXCISIONAL BIOPSY Right 1983  . COLONOSCOPY W/ POLYPECTOMY  2003,2006,2013   [name]  . DILATION AND CURETTAGE OF UTERUS     X2  . NEPHRECTOMY  2010   Left for 9.8cm benign complex cyst  . THYROIDECTOMY  2000   [name]  . TONSILLECTOMY    . TOTAL ABDOMINAL HYSTERECTOMY W/ BILATERAL SALPINGOOPHORECTOMY  2003   [name] for dysfunctional menses     Family History  Problem Relation Age of Onset  . Transient ischemic attack Father        in 56s  . Coronary artery disease Father        stents @ 51  . Nephrolithiasis Father   . Hyperlipidemia Mother   . Transient ischemic attack Mother 97  . Thyroid disease Mother         hyperthyroid  . Urolithiasis Sister   . Diabetes Neg Hx      Social History   Social History  . Marital status: Married    Spouse name: N/A  . Number of children: N/A  . Years of education: N/A   Occupational History  . Sales    Social  History Main Topics  . Smoking status: Former Smoker    Packs/day: 0.50    Years: 4.00    Quit date: 08/29/1975  . Smokeless tobacco: Never Used     Comment: smoked 1973-1977, up to 1/2 ppd  . Alcohol use 0.0 oz/week     Comment: minimally  1 every 6 months  . Drug use: No  . Sexual activity: Yes     Comment: HYST   Other Topics Concern  . Not on file   Social History Narrative   Fun/Hobbies: Grandkids     Review of Systems  Constitutional: Denies fever, chills, fatigue, or significant weight gain/loss. HENT: Head: Denies headache or neck pain Ears: Denies changes in hearing, ringing in ears, earache, drainage Nose: Denies discharge, stuffiness, itching, nosebleed, sinus pain Throat: Denies sore throat, hoarseness, dry mouth, sores, thrush Eyes: Denies loss/changes in vision, pain, redness, blurry/double vision, flashing lights Cardiovascular: Denies chest pain/discomfort, tightness, palpitations, shortness of breath with activity, difficulty lying down, swelling, sudden awakening with shortness of breath Respiratory: Denies shortness of breath, cough, sputum production, wheezing Gastrointestinal: Denies dysphasia, heartburn, change in appetite, nausea, change in bowel habits, rectal bleeding, constipation, diarrhea, yellow skin or eyes Genitourinary: Denies frequency, urgency, burning/pain, blood in urine, incontinence, change in urinary strength. Musculoskeletal: Denies muscle/joint pain, stiffness, back pain, redness or swelling of joints, trauma Skin: Denies rashes, lumps, itching, dryness, color changes, or hair/nail changes Neurological: Denies dizziness, fainting, seizures, weakness, numbness, tingling,  tremor Psychiatric - Denies nervousness, stress, depression or memory loss Endocrine: Denies heat or cold intolerance, sweating, frequent urination, excessive thirst, changes in appetite Hematologic: Denies ease of bruising or bleeding    Objective:     BP (!) 148/92 (BP Location: Left Arm, Patient Position: Sitting, Cuff Size: Normal)   Pulse 83   Temp 98.4 F (36.9 C) (Oral)   Resp 16   Ht 5' 5.25" (1.657 m)   Wt 156 lb (70.8 kg)   SpO2 98%   BMI 25.76 kg/m  Nursing note and vital signs reviewed.  Physical Exam  Constitutional: She is oriented to person, place, and time. She appears well-developed and well-nourished.  HENT:  Head: Normocephalic.  Right Ear: Hearing, tympanic membrane, external ear and ear canal normal.  Left Ear: Hearing, tympanic membrane, external ear and ear canal normal.  Nose: Nose normal.  Mouth/Throat: Uvula is midline, oropharynx is clear and moist and mucous membranes are normal.  Eyes: Conjunctivae and EOM are normal. Pupils are equal, round, and reactive to light.  Neck: Neck supple. No JVD present. No tracheal deviation present. No thyromegaly present.  Cardiovascular: Normal rate, regular rhythm, normal heart sounds and intact distal pulses.   Pulmonary/Chest: Effort normal and breath sounds normal.  Abdominal: Soft. Bowel sounds are normal. She exhibits no distension and no mass. There is no tenderness. There is no rebound and no guarding.  Musculoskeletal: Normal range of motion. She exhibits no edema or tenderness.  Lymphadenopathy:    She has no cervical adenopathy.  Neurological: She is alert and oriented to person, place, and time. She has normal reflexes. No cranial nerve deficit. She exhibits normal muscle tone. Coordination normal.  Skin: Skin is warm and dry.  Psychiatric: She has a normal mood and affect. Her behavior is normal. Judgment and thought content normal.       Assessment & Plan:   During the course of the visit the  patient was educated and counseled about appropriate screening and preventive services including:  Pneumococcal vaccine   Influenza vaccine  Colorectal cancer screening  Diabetes screening  Glaucoma screening  Nutrition counseling   Diet review for nutrition referral? Yes ____  Not Indicated _X___   Patient Instructions (the written plan) was given to the patient.  Medicare Attestation I have personally reviewed: The patient's medical and social history Their use of alcohol, tobacco or illicit drugs Their current medications and supplements The patient's functional ability including ADLs,fall risks, home safety risks, cognitive, and hearing and visual impairment Diet and physical activities Evidence for depression or mood disorders  The patient's weight, height, BMI,  have been recorded in the chart.  I have made referrals, counseling, and provided education to the patient based on review of the above and I have provided the patient with a written personalized care plan for preventive services.     Problem List Items Addressed This Visit      Cardiovascular and Mediastinum   Essential hypertension    Blood pressure slightly elevated above goal 140/90 with home blood pressure readings below goal. No adverse side effects or hypotensive readings. Continue current dosage of amlodipine, benazepril, and metoprolol. Encouraged monitor blood pressure at home and follow sodium diet.        Other   HYPERLIPIDEMIA    Lipid profile obtained today. Continue current dosage of atorvastatin pending lipid profile results.      Routine general medical examination at a health care facility    1) Anticipatory Guidance: Discussed importance of wearing a seatbelt while driving and not texting while driving; changing batteries in smoke detector at least once annually; wearing suntan lotion when outside; eating a balanced and moderate diet; getting physical activity at least 30 minutes per  day.  2) Immunizations / Screenings / Labs:  All immunizations are up to date per recommendations. Breast cancer screening and DEXA are up to date per recommendations. Due for colon cancer screening with patient follow up with gastroenterology. All other screenings are up to date per recommendations. Obtain CBC, CMET, and lipid profile.    Overall well exam with risk factors for cardiovascular disease including hypertension and dyslipidemia. Chronic conditions periodically controlled with current medication regimen and no adverse side effects. Continue healthy lifestyle behaviors and choices. Follow-up prevention exam in 1 year. Follow-up office visit pending blood work and for chronic conditions.       Relevant Orders   CBC   Comprehensive metabolic panel   Lipid panel   Medicare annual wellness visit, subsequent - Primary    Reviewed and updated patient's medical, surgical, family and social history. Medications and allergies were also reviewed. Basic screenings for depression, activities of daily living, hearing, cognition and safety were performed. Provider list was updated and health plan was provided to the patient.           I have discontinued Ms. Speranza's Estradiol. I am also having her maintain her aspirin, Calcium Carbonate-Vitamin D (CALTRATE 600+D PO), Multiple Vitamins-Minerals (CENTRUM PO), potassium citrate, acetaminophen, fenofibrate, benazepril, levothyroxine, atorvastatin, metoprolol succinate, amLODipine, and LORazepam.   Follow-up: Return in about 6 months (around 08/04/2017), or if symptoms worsen or fail to improve.   Mauricio Po, FNP

## 2017-02-09 ENCOUNTER — Other Ambulatory Visit (INDEPENDENT_AMBULATORY_CARE_PROVIDER_SITE_OTHER): Payer: Medicare Other

## 2017-02-09 ENCOUNTER — Other Ambulatory Visit: Payer: Self-pay | Admitting: Family

## 2017-02-09 DIAGNOSIS — I10 Essential (primary) hypertension: Secondary | ICD-10-CM

## 2017-02-09 DIAGNOSIS — R7989 Other specified abnormal findings of blood chemistry: Secondary | ICD-10-CM | POA: Diagnosis not present

## 2017-02-09 DIAGNOSIS — Z0000 Encounter for general adult medical examination without abnormal findings: Secondary | ICD-10-CM | POA: Diagnosis not present

## 2017-02-09 DIAGNOSIS — R944 Abnormal results of kidney function studies: Secondary | ICD-10-CM

## 2017-02-09 LAB — LIPID PANEL
CHOLESTEROL: 271 mg/dL — AB (ref 0–200)
HDL: 45.8 mg/dL (ref >39.00)
NonHDL: 225.63
Total CHOL/HDL Ratio: 6
Triglycerides: 248 mg/dL — ABNORMAL HIGH (ref 0.0–149.0)
VLDL: 49.6 mg/dL — AB (ref 0.0–40.0)

## 2017-02-09 LAB — COMPREHENSIVE METABOLIC PANEL
ALBUMIN: 4.3 g/dL (ref 3.5–5.2)
ALK PHOS: 57 U/L (ref 39–117)
ALT: 13 U/L (ref 0–35)
AST: 18 U/L (ref 0–37)
BILIRUBIN TOTAL: 0.4 mg/dL (ref 0.2–1.2)
BUN: 28 mg/dL — ABNORMAL HIGH (ref 6–23)
CO2: 28 mEq/L (ref 19–32)
Calcium: 9.6 mg/dL (ref 8.4–10.5)
Chloride: 106 mEq/L (ref 96–112)
Creatinine, Ser: 1.22 mg/dL — ABNORMAL HIGH (ref 0.40–1.20)
GFR: 46.17 mL/min — ABNORMAL LOW (ref >60.00)
Glucose, Bld: 90 mg/dL (ref 70–99)
POTASSIUM: 4.4 meq/L (ref 3.5–5.1)
SODIUM: 141 meq/L (ref 135–145)
TOTAL PROTEIN: 7.3 g/dL (ref 6.0–8.3)

## 2017-02-09 LAB — LDL CHOLESTEROL, DIRECT: Direct LDL: 165 mg/dL

## 2017-02-09 LAB — CBC
HEMATOCRIT: 40.4 % (ref 36.0–46.0)
HEMOGLOBIN: 13.4 g/dL (ref 12.0–15.0)
MCHC: 33.1 g/dL (ref 30.0–36.0)
MCV: 92.2 fl (ref 78.0–100.0)
Platelets: 348 10*3/uL (ref 150.0–400.0)
RBC: 4.38 Mil/uL (ref 3.87–5.11)
RDW: 13.3 % (ref 11.5–15.5)
WBC: 7.4 10*3/uL (ref 4.0–10.5)

## 2017-02-10 ENCOUNTER — Other Ambulatory Visit: Payer: Self-pay | Admitting: Family

## 2017-02-11 ENCOUNTER — Encounter: Payer: Self-pay | Admitting: Family

## 2017-03-02 ENCOUNTER — Ambulatory Visit (INDEPENDENT_AMBULATORY_CARE_PROVIDER_SITE_OTHER): Payer: Medicare Other | Admitting: Family

## 2017-03-02 ENCOUNTER — Encounter: Payer: Self-pay | Admitting: Family

## 2017-03-02 VITALS — BP 168/98 | HR 81 | Temp 98.8°F | Resp 16 | Ht 62.25 in | Wt 156.0 lb

## 2017-03-02 DIAGNOSIS — L989 Disorder of the skin and subcutaneous tissue, unspecified: Secondary | ICD-10-CM | POA: Diagnosis not present

## 2017-03-02 DIAGNOSIS — E782 Mixed hyperlipidemia: Secondary | ICD-10-CM | POA: Diagnosis not present

## 2017-03-02 NOTE — Assessment & Plan Note (Signed)
Currently maintained on atorvastatin with questionable myalgias and arthralgias as an adverse side effect. Hold atorvastatin 2 weeks to determine improvement in muscle/joint pains. Encouraged a nutritional intake that is moderate, balance, and varied and low in saturated fats and processed/sugary foods. Follow-up in 2 weeks to determine effectiveness.

## 2017-03-02 NOTE — Progress Notes (Signed)
Subjective:    Patient ID: [name], female    DOB: Oct 11, 1945, 71 y.o.   MRN: [id number]  Chief Complaint  Patient presents with  . Follow-up    cyst on thumb she wants checked out, wants to talk about atorvastatin     HPI:  [name] is a 71 y.o. female who  has a past medical history of Adenomatous colon polyp; Arthritis; Cataract; Heart murmur; Hyperlipidemia; Hypertension; and Thyroid disease. and presents today for an office visit.  1.) Cyst - This is a new problem. Associated symptom of a cyst located on her right thumb has been going on for several weeks. Described the cyst as elevated and gets caught when using her thumb. No tenderness. Denies any modifying factors or attempted treatments. Course of the cyst has appeared to increase in size since initial onset.   2.) Hyperlipidemia - Currently maintained on atorvastatin. Reports taken medications prescribed and notes some mild myalgas and arthralgias. No chest pain, shortness of breath, or dyspnea on exertion.   Allergies  Allergen Reactions  . Codeine Sulfate     "made me jumpy"  . Erythromycin     nausea      Outpatient Medications Prior to Visit  Medication Sig Dispense Refill  . acetaminophen (TYLENOL ARTHRITIS PAIN) 650 MG CR tablet Take 650 mg by mouth 4 (four) times daily as needed.     [name] amLODipine (NORVASC) 5 MG tablet TAKE 1 TABLET BY MOUTH EVERY DAY 90 tablet 1  . aspirin 81 MG tablet Take 81 mg by mouth daily.      [name] atorvastatin (LIPITOR) 20 MG tablet TAKE 1 TABLET BY MOUTH EVERY DAY AT 6PM 90 tablet 0  . benazepril (LOTENSIN) 20 MG tablet TAKE 1.5 TABLETS (30 MG TOTAL) BY MOUTH DAILY. 135 tablet 1  . Calcium Carbonate-Vitamin D (CALTRATE 600+D PO) Take by mouth daily.      . fenofibrate 160 MG tablet TAKE 1 TABLET BY MOUTH EVERY DAY 90 tablet 3  . levothyroxine (SYNTHROID, LEVOTHROID) 100 MCG tablet TAKE 1 TABLET BY MOUTH EVERY DAY EXECPT 1 AND 1/2 TABLETS ON WEDNESDAYS 90 tablet 1  . LORazepam  (ATIVAN) 1 MG tablet TAKE 1/2 TO 1 TABLET BY MOUTH EVERY 8 HOURS AS NEEDED 60 tablet 1  . metoprolol succinate (TOPROL-XL) 50 MG 24 hr tablet Take 1 tablet by mouth every day 180 tablet 0  . Multiple Vitamins-Minerals (CENTRUM PO) Take by mouth daily.      . potassium citrate (UROCIT-K 10) 10 MEQ (1080 MG) SR tablet Take 10 mEq by mouth daily.      No facility-administered medications prior to visit.       Past Surgical History:  Procedure Laterality Date  . BREAST EXCISIONAL BIOPSY Right 1983  . COLONOSCOPY W/ POLYPECTOMY  2003,2006,2013   [name]  . DILATION AND CURETTAGE OF UTERUS     X2  . NEPHRECTOMY  2010   Left for 9.8cm benign complex cyst  . THYROIDECTOMY  2000   [name]  . TONSILLECTOMY    . TOTAL ABDOMINAL HYSTERECTOMY W/ BILATERAL SALPINGOOPHORECTOMY  2003   [name] for dysfunctional menses      Past Medical History:  Diagnosis Date  . Adenomatous colon polyp   . Arthritis   . Cataract   . Heart murmur    MVP  . Hyperlipidemia   . Hypertension   . Thyroid disease       Review of Systems  Constitutional: Negative for chills  and fever.  Eyes:       Negative for changes in vision  Respiratory: Negative for cough, chest tightness and wheezing.   Cardiovascular: Negative for chest pain, palpitations and leg swelling.  Neurological: Negative for dizziness, weakness and light-headedness.      Objective:    BP (!) 168/98 (BP Location: Left Arm, Patient Position: Sitting, Cuff Size: Large)   Pulse 81   Temp 98.8 F (37.1 C) (Oral)   Resp 16   Ht 5' 2.25" (1.581 m)   Wt 156 lb (70.8 kg)   SpO2 97%   BMI 28.30 kg/m  Nursing note and vital signs reviewed.  Physical Exam  Constitutional: She is oriented to person, place, and time. She appears well-developed and well-nourished. No distress.  Cardiovascular: Normal rate, regular rhythm, normal heart sounds and intact distal pulses.   Pulmonary/Chest: Effort normal and breath sounds normal.    Neurological: She is alert and oriented to person, place, and time.  Skin: Skin is warm and dry.  Left thumb - Eraser sized sized located on the lateral aspect of her left thumb with some tenderness. Firm to the touch with mild redness around it.   Psychiatric: She has a normal mood and affect. Her behavior is normal. Judgment and thought content normal.   Procedure: Incision and drainage of simple cyst of thumb. Verbal consent was obtained and following discussion regarding risks and alternatives. The site was cleansed with betadine and allowed to dry. Cold spray was applied for skin anesthesia followed by injection of 1% lidocaine for localized anesthesia. Upon successful anesthesia, a #11 blade was used to puncture a small hole in the cyst with return of blood and white sebaceous drainage. The site was cleansed and a bandage was placed. The procedure was completed without complication and tolerated well.     Assessment & Plan:   Problem List Items Addressed This Visit      Musculoskeletal and Integument   Skin lesion - Primary    Symptoms and exam consistent with sebaceous cysts of the left thumb successfully incised and drained. Post-care instructions provided for basic wound care. Follow-up for signs of infection or worsening.        Other   HYPERLIPIDEMIA    Currently maintained on atorvastatin with questionable myalgias and arthralgias as an adverse side effect. Hold atorvastatin 2 weeks to determine improvement in muscle/joint pains. Encouraged a nutritional intake that is moderate, balance, and varied and low in saturated fats and processed/sugary foods. Follow-up in 2 weeks to determine effectiveness.          I am having [name] maintain her aspirin, Calcium Carbonate-Vitamin D (CALTRATE 600+D PO), Multiple Vitamins-Minerals (CENTRUM PO), potassium citrate, acetaminophen, fenofibrate, levothyroxine, atorvastatin, amLODipine, LORazepam, metoprolol succinate, and  benazepril.   No orders of the defined types were placed in this encounter.    Follow-up: Return in about 2 weeks (around 03/16/2017), or if symptoms worsen or fail to improve.  Mauricio Po, FNP

## 2017-03-02 NOTE — Patient Instructions (Signed)
Thank you for choosing [facility].  SUMMARY AND INSTRUCTIONS:  Please stop taking the atorvastatin.  Keep the wound clean and dry with soap and water no alcohol or peroxide.  Follow-up for signs of infection including redness, swelling, or change in color discharge.  Follow up:  If your symptoms worsen or fail to improve, please contact our office for further instruction, or in case of emergency go directly to the emergency room at the closest medical facility.

## 2017-03-02 NOTE — Assessment & Plan Note (Signed)
Symptoms and exam consistent with sebaceous cysts of the left thumb successfully incised and drained. Post-care instructions provided for basic wound care. Follow-up for signs of infection or worsening.

## 2017-03-17 ENCOUNTER — Other Ambulatory Visit: Payer: Self-pay | Admitting: Family

## 2017-04-04 ENCOUNTER — Other Ambulatory Visit: Payer: Self-pay | Admitting: Family

## 2017-04-13 ENCOUNTER — Other Ambulatory Visit: Payer: Self-pay

## 2017-04-13 NOTE — Telephone Encounter (Signed)
Last refill was 03/10/17 per [place] [facility]

## 2017-04-16 MED ORDER — LORAZEPAM 1 MG PO TABS: ORAL_TABLET | ORAL | 1 refills | Status: DC

## 2017-04-16 NOTE — Telephone Encounter (Signed)
Rx faxed

## 2017-05-07 ENCOUNTER — Other Ambulatory Visit: Payer: Self-pay | Admitting: Family

## 2017-05-07 DIAGNOSIS — E890 Postprocedural hypothyroidism: Secondary | ICD-10-CM

## 2017-05-23 ENCOUNTER — Ambulatory Visit: Payer: Self-pay | Admitting: Family

## 2017-05-23 ENCOUNTER — Ambulatory Visit (INDEPENDENT_AMBULATORY_CARE_PROVIDER_SITE_OTHER): Payer: Medicare Other | Admitting: Family

## 2017-05-23 ENCOUNTER — Encounter: Payer: Self-pay | Admitting: Family

## 2017-05-23 VITALS — BP 144/90 | HR 84 | Temp 98.3°F | Resp 16 | Ht 65.5 in | Wt 152.0 lb

## 2017-05-23 DIAGNOSIS — F329 Major depressive disorder, single episode, unspecified: Secondary | ICD-10-CM | POA: Diagnosis not present

## 2017-05-23 DIAGNOSIS — F32A Depression, unspecified: Secondary | ICD-10-CM | POA: Insufficient documentation

## 2017-05-23 DIAGNOSIS — F419 Anxiety disorder, unspecified: Secondary | ICD-10-CM

## 2017-05-23 DIAGNOSIS — Z23 Encounter for immunization: Secondary | ICD-10-CM | POA: Diagnosis not present

## 2017-05-23 MED ORDER — MIRTAZAPINE 15 MG PO TABS: 15.0000 mg | ORAL_TABLET | Freq: Every day | ORAL | 2 refills | Status: DC

## 2017-05-23 MED ORDER — ALPRAZOLAM 0.5 MG PO TABS: 0.5000 mg | ORAL_TABLET | Freq: Two times a day (BID) | ORAL | 1 refills | Status: DC | PRN

## 2017-05-23 NOTE — Progress Notes (Signed)
Subjective:    Patient ID: [name], female    DOB: 05-05-1946, 71 y.o.   MRN: [id number]  Chief Complaint  Patient presents with  . Anxiety    having family problems, son is alcoholic and she is raising grandkids    HPI:  [name] is a 71 y.o. female who  has a past medical history of Adenomatous colon polyp; Arthritis; Cataract; Heart murmur; Hyperlipidemia; Hypertension; and Thyroid disease. and presents today for a follow up office visit.  Anxiety and depression - Currently maintained on lorazepam. Reports taking the medication as prescribed and denies adverse side effects. She has significantly increased amounts of depression and anxiety secondary to multiple stressors including family issues and sick friends with cancer. Has restless sleep. Has taken another individual's Xanax which did allow her to sleep well through the night. She does have support around her to help.   Allergies  Allergen Reactions  . Codeine Sulfate     "made me jumpy"  . Erythromycin     nausea      Outpatient Medications Prior to Visit  Medication Sig Dispense Refill  . acetaminophen (TYLENOL ARTHRITIS PAIN) 650 MG CR tablet Take 650 mg by mouth 4 (four) times daily as needed.     [name] amLODipine (NORVASC) 5 MG tablet TAKE 1 TABLET BY MOUTH EVERY DAY 90 tablet 1  . aspirin 81 MG tablet Take 81 mg by mouth daily.      [name] atorvastatin (LIPITOR) 20 MG tablet TAKE 1 TABLET BY MOUTH EVERY DAY AT 6PM 90 tablet 0  . benazepril (LOTENSIN) 20 MG tablet TAKE 1.5 TABLETS (30 MG TOTAL) BY MOUTH DAILY. 135 tablet 1  . Calcium Carbonate-Vitamin D (CALTRATE 600+D PO) Take by mouth daily.      . fenofibrate 160 MG tablet TAKE 1 TABLET BY MOUTH EVERY DAY 90 tablet 1  . fenofibrate 160 MG tablet TAKE 1 TABLET BY MOUTH EVERY DAY 90 tablet 1  . levothyroxine (SYNTHROID, LEVOTHROID) 100 MCG tablet TAKE 1 TABLET BY MOUTH EVERY DAY EXECPT 1 AND 1/2 TABLETS ON WEDNESDAYS 90 tablet 1  . metoprolol succinate  (TOPROL-XL) 50 MG 24 hr tablet Take 1 tablet by mouth every day 180 tablet 0  . Multiple Vitamins-Minerals (CENTRUM PO) Take by mouth daily.      . potassium citrate (UROCIT-K 10) 10 MEQ (1080 MG) SR tablet Take 10 mEq by mouth daily.     [name] LORazepam (ATIVAN) 1 MG tablet TAKE 1/2 TO 1 TABLET BY MOUTH EVERY 8 HOURS AS NEEDED 60 tablet 1   No facility-administered medications prior to visit.      Past Medical History:  Diagnosis Date  . Adenomatous colon polyp   . Arthritis   . Cataract   . Heart murmur    MVP  . Hyperlipidemia   . Hypertension   . Thyroid disease       Review of Systems  Constitutional: Negative for chills and fever.  Eyes:       Negative for changes in vision  Respiratory: Negative for cough, chest tightness and wheezing.   Cardiovascular: Negative for chest pain, palpitations and leg swelling.  Neurological: Negative for dizziness, weakness and light-headedness.  Psychiatric/Behavioral: Positive for dysphoric mood and sleep disturbance. Negative for hallucinations, self-injury and suicidal ideas. The patient is nervous/anxious. The patient is not hyperactive.       Objective:    BP (!) 144/90 (BP Location: Left Arm, Patient Position: Sitting, Cuff Size: Large)  Pulse 84   Temp 98.3 F (36.8 C) (Oral)   Resp 16   Ht 5' 5.5" (1.664 m)   Wt 152 lb (68.9 kg)   SpO2 98%   BMI 24.91 kg/m  Nursing note and vital signs reviewed.  Physical Exam  Constitutional: She is oriented to person, place, and time. She appears well-developed and well-nourished. No distress.  Cardiovascular: Normal rate, regular rhythm, normal heart sounds and intact distal pulses.   Pulmonary/Chest: Effort normal and breath sounds normal.  Neurological: She is alert and oriented to person, place, and time.  Skin: Skin is warm and dry.  Psychiatric: Her behavior is normal. Judgment and thought content normal. Her mood appears anxious. She exhibits a depressed mood.         Assessment & Plan:   Problem List Items Addressed This Visit      Other   Need for influenza vaccination   Relevant Orders   Flu vaccine HIGH DOSE PF (Fluzone High dose) (Completed)   Anxiety and depression - Primary    New onset anxiety and depression related to increased levels of stress secondary to family and relationship/friendship sickness/illness. Discussed importance of support and counseling. She is open to counseling, however appointments her not available currently. Discontinue lorazepam. Start Remeron and alprazolam. Denies suicidal ideations and has good support structure around her. No evidence of psychosis. Follow-up in one month or sooner if symptoms worsen or do not improve.          I have discontinued Ms. Kullman's LORazepam. I am also having her start on mirtazapine and ALPRAZolam. Additionally, I am having her maintain her aspirin, Calcium Carbonate-Vitamin D (CALTRATE 600+D PO), Multiple Vitamins-Minerals (CENTRUM PO), potassium citrate, acetaminophen, atorvastatin, amLODipine, metoprolol succinate, benazepril, fenofibrate, fenofibrate, and levothyroxine.   Meds ordered this encounter  Medications  . mirtazapine (REMERON) 15 MG tablet    Sig: Take 1 tablet (15 mg total) by mouth at bedtime.    Dispense:  30 tablet    Refill:  2    Order Specific Question:   Supervising Provider    Answer:   Pricilla Holm A [[id number]]  . ALPRAZolam (XANAX) 0.5 MG tablet    Sig: Take 1-2 tablets (0.5-1 mg total) by mouth 2 (two) times daily as needed for anxiety or sleep.    Dispense:  60 tablet    Refill:  1    Order Specific Question:   Supervising Provider    Answer:   Pricilla Holm A [[id number]]     Follow-up: Return in about 1 month (around 06/22/2017), or if symptoms worsen or fail to improve.  Mauricio Po, FNP

## 2017-05-23 NOTE — Patient Instructions (Signed)
Thank you for choosing [facility].  SUMMARY AND INSTRUCTIONS:  STOP taking lorazepam.  START the mertazipine (Remeron) nightly.  Xanax as needed for anxiety or sleep.   Continue to use your resources and support systems.   Medication:  Your prescription(s) have been submitted to your pharmacy or been printed and provided for you. Please take as directed and contact our office if you believe you are having problem(s) with the medication(s) or have any questions.  Follow up:  If your symptoms worsen or fail to improve, please contact our office for further instruction, or in case of emergency go directly to the emergency room at the closest medical facility.

## 2017-05-23 NOTE — Assessment & Plan Note (Signed)
New onset anxiety and depression related to increased levels of stress secondary to family and relationship/friendship sickness/illness. Discussed importance of support and counseling. She is open to counseling, however appointments her not available currently. Discontinue lorazepam. Start Remeron and alprazolam. Denies suicidal ideations and has good support structure around her. No evidence of psychosis. Follow-up in one month or sooner if symptoms worsen or do not improve.

## 2017-06-18 ENCOUNTER — Other Ambulatory Visit: Payer: Self-pay | Admitting: *Deleted

## 2017-06-18 MED ORDER — MIRTAZAPINE 15 MG PO TABS: 15.0000 mg | ORAL_TABLET | Freq: Every day | ORAL | 0 refills | Status: DC

## 2017-06-27 ENCOUNTER — Ambulatory Visit: Payer: Medicare Other | Admitting: Nurse Practitioner

## 2017-06-29 ENCOUNTER — Other Ambulatory Visit: Payer: Self-pay | Admitting: *Deleted

## 2017-06-29 MED ORDER — AMLODIPINE BESYLATE 5 MG PO TABS: 5.0000 mg | ORAL_TABLET | Freq: Every day | ORAL | 1 refills | Status: DC

## 2017-07-31 ENCOUNTER — Other Ambulatory Visit: Payer: Self-pay | Admitting: *Deleted

## 2017-07-31 DIAGNOSIS — I10 Essential (primary) hypertension: Secondary | ICD-10-CM

## 2017-07-31 DIAGNOSIS — E890 Postprocedural hypothyroidism: Secondary | ICD-10-CM

## 2017-07-31 MED ORDER — LEVOTHYROXINE SODIUM 100 MCG PO TABS: ORAL_TABLET | ORAL | 0 refills | Status: DC

## 2017-08-01 MED ORDER — METOPROLOL SUCCINATE ER 50 MG PO TB24: ORAL_TABLET | ORAL | 0 refills | Status: DC

## 2017-08-01 NOTE — Addendum Note (Signed)
Addended by: Earnstine Regal on: 08/01/2017 01:40 PM   Modules accepted: Orders

## 2017-08-13 ENCOUNTER — Other Ambulatory Visit: Payer: Self-pay | Admitting: Nurse Practitioner

## 2017-08-15 ENCOUNTER — Ambulatory Visit (INDEPENDENT_AMBULATORY_CARE_PROVIDER_SITE_OTHER): Payer: Medicare Other | Admitting: Nurse Practitioner

## 2017-08-15 ENCOUNTER — Encounter: Payer: Self-pay | Admitting: Nurse Practitioner

## 2017-08-15 VITALS — BP 146/94 | HR 78 | Temp 98.6°F | Resp 16 | Ht 65.5 in | Wt 155.5 lb

## 2017-08-15 DIAGNOSIS — F419 Anxiety disorder, unspecified: Secondary | ICD-10-CM

## 2017-08-15 DIAGNOSIS — F329 Major depressive disorder, single episode, unspecified: Secondary | ICD-10-CM

## 2017-08-15 MED ORDER — SERTRALINE HCL 50 MG PO TABS: 50.0000 mg | ORAL_TABLET | Freq: Every day | ORAL | 1 refills | Status: DC

## 2017-08-15 NOTE — Assessment & Plan Note (Signed)
Medications ordered: - sertraline (ZOLOFT) 50 MG tablet; Take 1 tablet (50 mg total) by mouth daily.  Dispense: 30 tablet; Refill: 1 Instructions given to start with 1/2 dose on week 1 and increase to full dose on week 2. Side effects discussed. She will continue xanax once daily only at bedtime, with no dosage increases. Will consider weaning xanax if she has symptom improvement with zoloft.

## 2017-08-15 NOTE — Patient Instructions (Addendum)
I have sent a prescription for zoloft 50mg  tablets to your pharmacy. Please start 1/2 tablet once daily for 1 week and then increase to a full tablet once daily on week two as tolerated.  Some side effects such as nausea, drowsiness and weight gain can occur.  Also rarely people have experienced suicidal thoughts when taking this medication.  Please discontinue the medication and go directly to ED if this occurs.   I'd like to see you back in about 1 month to evaluate progress.    Please continue to take your xanax 1 tablet nightly ONLY at bedtime as needed to help you sleep, but do not increase the amount you are taking.  It was nice to meet you. Thanks for letting me take care of you today :)

## 2017-08-15 NOTE — Progress Notes (Addendum)
Subjective:    Patient ID: [name], female    DOB: 18-Jan-1946, 71 y.o.   MRN: [id number]  HPI [name] is a 71 yo female who presents today to establish care.  Is transferring to me from another provider in the same clinic.She has the following significant medical problems: hypertension, hypothyroidism, osteopenia, degenerative joint disease, hyperlipidemia, nephrolithiases, nephrectomy, hypertriglyceridemia, anxiety and depression. She presents today for chief complaint of anxiety and depression.  Anxiety and depression-  This is a chronic roblem. She was first seen in this clinic for anxiety and depression this past September. She was started on remeron daily and xanax prn.  She was unable to tolerate the remeron because it made her feel nervous and she felt like it altered her vision. She has continued to take xanax once nightly at bedtime. She does not take the xanax more frequently for fear of addiction. She has noticed improved sleep with the xanax, she is able to sleep through the night with the xanax, whereas prior she woke up multiple times at night worrying. She says she is feeling better today than when she was seen in September, she has been able to resolve some of the family issues that she was having at that time. She feels that her symptoms are partly improved due to being able to get adequate sleep at night. She does continue to have daily worry over things that she can not control. She denies thoughts of harming herself or others.  Review of Systems  Psychiatric/Behavioral: Negative for agitation, hallucinations and suicidal ideas.    Past Medical History:  Diagnosis Date  . Adenomatous colon polyp   . Arthritis   . Cataract   . Heart murmur    MVP  . Hyperlipidemia   . Hypertension   . Thyroid disease      Social History   Socioeconomic History  . Marital status: Married    Spouse name: Not on file  . Number of children: Not on file  . Years of  education: Not on file  . Highest education level: Not on file  Social Needs  . Financial resource strain: Not on file  . Food insecurity - worry: Not on file  . Food insecurity - inability: Not on file  . Transportation needs - medical: Not on file  . Transportation needs - non-medical: Not on file  Occupational History  . Occupation: Sales  Tobacco Use  . Smoking status: Former Smoker    Packs/day: 0.50    Years: 4.00    Pack years: 2.00    Last attempt to quit: 08/29/1975    Years since quitting: 41.9  . Smokeless tobacco: Never Used  . Tobacco comment: smoked 1973-1977, up to 1/2 ppd  Substance and Sexual Activity  . Alcohol use: Yes    Alcohol/week: 0.0 oz    Comment: minimally  1 every 6 months  . Drug use: No  . Sexual activity: Yes    Comment: HYST  Other Topics Concern  . Not on file  Social History Narrative   Fun/Hobbies: Grandkids    Past Surgical History:  Procedure Laterality Date  . BREAST EXCISIONAL BIOPSY Right 1983  . COLONOSCOPY W/ POLYPECTOMY  2003,2006,2013   [name]  . DILATION AND CURETTAGE OF UTERUS     X2  . NEPHRECTOMY  2010   Left for 9.8cm benign complex cyst  . THYROIDECTOMY  2000   [name]  . TONSILLECTOMY    . TOTAL ABDOMINAL HYSTERECTOMY  W/ BILATERAL SALPINGOOPHORECTOMY  2003   [name] for dysfunctional menses    Family History  Problem Relation Age of Onset  . Transient ischemic attack Father        in 67s  . Coronary artery disease Father        stents @ 20  . Nephrolithiasis Father   . Hyperlipidemia Mother   . Transient ischemic attack Mother 38  . Thyroid disease Mother        hyperthyroid  . Urolithiasis Sister   . Diabetes Neg Hx     Allergies  Allergen Reactions  . Codeine Sulfate     "made me jumpy"  . Erythromycin     nausea    Current Outpatient Medications on File Prior to Visit  Medication Sig Dispense Refill  . acetaminophen (TYLENOL ARTHRITIS PAIN) 650 MG CR tablet Take 650 mg by mouth 4 (four)  times daily as needed.     . ALPRAZolam (XANAX) 0.5 MG tablet Take 1-2 tablets (0.5-1 mg total) by mouth 2 (two) times daily as needed for anxiety or sleep. 60 tablet 1  . amLODipine (NORVASC) 5 MG tablet Take 1 tablet (5 mg total) by mouth daily. Must keep appt w/new provider for future refills 30 tablet 1  . aspirin 81 MG tablet Take 81 mg by mouth daily.      [name] atorvastatin (LIPITOR) 20 MG tablet TAKE 1 TABLET BY MOUTH EVERY DAY AT 6PM 90 tablet 0  . benazepril (LOTENSIN) 20 MG tablet TAKE 1.5 TABLETS (30 MG TOTAL) BY MOUTH DAILY. 135 tablet 1  . Calcium Carbonate-Vitamin D (CALTRATE 600+D PO) Take by mouth daily.      . fenofibrate 160 MG tablet TAKE 1 TABLET BY MOUTH EVERY DAY 90 tablet 1  . levothyroxine (SYNTHROID, LEVOTHROID) 100 MCG tablet TAKE 1 TABLET BY MOUTH EVERY DAY EXECPT 1 AND 1/2 TABLETS ON WEDNESDAYS 90 tablet 0  . metoprolol succinate (TOPROL-XL) 50 MG 24 hr tablet Take 1 tablet by mouth every day 30 tablet 0  . Multiple Vitamins-Minerals (CENTRUM PO) Take by mouth daily.      . potassium citrate (UROCIT-K 10) 10 MEQ (1080 MG) SR tablet Take 10 mEq by mouth daily.     . [DISCONTINUED] simvastatin (ZOCOR) 20 MG tablet Take 20 mg by mouth at bedtime.       No current facility-administered medications on file prior to visit.     BP (!) 146/94 (BP Location: Left Arm, Patient Position: Sitting, Cuff Size: Large)   Pulse 78   Temp 98.6 F (37 C) (Oral)   Resp 16   Ht 5' 5.5" (1.664 m)   Wt 155 lb 8 oz (70.5 kg)   SpO2 98%   BMI 25.48 kg/m      Objective:   Physical Exam  Constitutional: She is oriented to person, place, and time. She appears well-developed and well-nourished. No distress.  HENT:  Head: Normocephalic and atraumatic.  Cardiovascular: Normal rate, regular rhythm, normal heart sounds and intact distal pulses.  Pulmonary/Chest: Effort normal and breath sounds normal.  Neurological: She is alert and oriented to person, place, and time. Coordination normal.   Skin: Skin is warm and dry.  Psychiatric: She has a normal mood and affect. Judgment and thought content normal.      Assessment & Plan:

## 2017-09-02 ENCOUNTER — Other Ambulatory Visit: Payer: Self-pay | Admitting: Nurse Practitioner

## 2017-09-02 DIAGNOSIS — I10 Essential (primary) hypertension: Secondary | ICD-10-CM

## 2017-09-05 MED ORDER — AMLODIPINE BESYLATE 5 MG PO TABS: 5.0000 mg | ORAL_TABLET | Freq: Every day | ORAL | 1 refills | Status: DC

## 2017-09-05 NOTE — Telephone Encounter (Signed)
Route to CMA °

## 2017-09-05 NOTE — Telephone Encounter (Signed)
Spoke with pt and advised that her amlodipine has been sent to her pharmacy. Pt stated that she does not like zoloft and stopped taking it because it made her feel bad. She said that she feels fine she just used the xanax to help her sleep at night. She asked if you still wanted her to follow up the end of the month. I am assuming that was a follow up on the zoloft. I told her I would ask you first and get back with her on that.

## 2017-09-05 NOTE — Telephone Encounter (Signed)
Switched providers.   There is a note she must keep her follow up appt with Ashleigh in order to continue refills.  Her last OV was 08/15/17.   Next OV is 09/19/17.   I wasn't sure if she wanted [facility] to approve this or not.   Thanks.

## 2017-09-05 NOTE — Telephone Encounter (Signed)
If she feels like her anxiety is okay with one xanax daily at bedtime, then we can continue this for now. If she feels like her anxiety is getting worse or she feels like she needs to increase her xanax dosage at any point, we will need to look for alternatives that are safer than xanax. There are many other options besides xanax. Her blood pressure was slightly elevated at her last visit, and when I reviewed her chart I found that some of her lab work was abnormal this summer-June 2018. I would like for her to keep her upcoming appointment so that we can update labs and her other medical problems.

## 2017-09-05 NOTE — Telephone Encounter (Unsigned)
Copied from [place] [id number]. Topic: General - Other >> Sep 05, 2017  1:04 PM Neva Seat wrote:  Amlodipine Besylate 5mg   [facility]17[facility]  Pt as not taken the Rx for 2 days [facility] [employer]  [phone]

## 2017-09-06 NOTE — Telephone Encounter (Signed)
Pt aware to keep appointment.

## 2017-09-09 ENCOUNTER — Other Ambulatory Visit: Payer: Self-pay | Admitting: Nurse Practitioner

## 2017-09-09 DIAGNOSIS — I10 Essential (primary) hypertension: Secondary | ICD-10-CM

## 2017-09-13 ENCOUNTER — Other Ambulatory Visit: Payer: Self-pay | Admitting: Family

## 2017-09-19 ENCOUNTER — Encounter: Payer: Self-pay | Admitting: Nurse Practitioner

## 2017-09-19 ENCOUNTER — Ambulatory Visit (INDEPENDENT_AMBULATORY_CARE_PROVIDER_SITE_OTHER): Payer: Medicare Other | Admitting: Nurse Practitioner

## 2017-09-19 VITALS — BP 152/80 | HR 76 | Temp 97.8°F | Resp 16 | Ht 65.5 in | Wt 157.8 lb

## 2017-09-19 DIAGNOSIS — F419 Anxiety disorder, unspecified: Secondary | ICD-10-CM

## 2017-09-19 DIAGNOSIS — M25561 Pain in right knee: Secondary | ICD-10-CM

## 2017-09-19 DIAGNOSIS — F329 Major depressive disorder, single episode, unspecified: Secondary | ICD-10-CM

## 2017-09-19 DIAGNOSIS — I10 Essential (primary) hypertension: Secondary | ICD-10-CM

## 2017-09-19 DIAGNOSIS — M25552 Pain in left hip: Secondary | ICD-10-CM

## 2017-09-19 DIAGNOSIS — G8929 Other chronic pain: Secondary | ICD-10-CM | POA: Diagnosis not present

## 2017-09-19 MED ORDER — ALPRAZOLAM 0.5 MG PO TABS: 0.5000 mg | ORAL_TABLET | Freq: Every evening | ORAL | 1 refills | Status: DC | PRN

## 2017-09-19 NOTE — Assessment & Plan Note (Signed)
PHQ and GAD scores are low today She stopped zoloft due to side effects, we will not start an alternative today. Again, we discussed the long term risks of benzodiazepine use but we will continue xanax 0.5 mg at bedtime only per her request She will follow up if her mood worsens or if she feels increased need to use xanax Controlled substance registry reviewed with no irregularities.

## 2017-09-19 NOTE — Assessment & Plan Note (Signed)
maintained on amlodipine 5, benazepril 20, metoprolol succinate 50 daily and reports medication compliance. BP reading elevated in clinic today and also on past readings in clinic. Her home readings are normal. She will return in about 2 weeks with a BP log and her BP cuff for calibration- we will decide if medications need to be titrated at that time

## 2017-09-19 NOTE — Patient Instructions (Signed)
Please try to check your blood pressure once daily or at least a few times a week, at the same time each day, and keep a log. Please return in about 2 weeks with your log and your blood pressure cuff and we will see if we need to make adjustments to your blood pressure medications.  Please head downstairs for x-rays.  It was good to see you. Thanks for letting me take care of you today :)

## 2017-09-19 NOTE — Progress Notes (Signed)
Name: [name]   MRN: [id number]    DOB: 1946-04-06   Date:09/19/2017       Progress Note  Subjective  Chief Complaint  Chief Complaint  Patient presents with  . Follow-up    lab work redone, refill of xanax, left hip and right knee has been bothering her thinks its arthritis    HPI [name] is here for a follow up of anxiety and depression and also has an acute complaint of pain. She is also hypertensive today  Anxiety and depression- She is here for a follow up of her anxiety and depression today. She was started on zoloft about one month ago after no improvement of her mood on remeron. She is also maintained on xanax which she takes only 0.5 mg at bedtime. She stopped the zoloft after a couple of weeks because it made her feel nauseated and her "head felt foggy."  She says her anxiety and depression are directly related to her sons current legal trouble, she feels that this will decrease once they can get her son through his trouble. She would like to continue the xanax 0.5 at bedtime because it helps her get a full nights sleep and feel less anxious and depressed the next day. Without the xanax she does not sleep well and feels fatigued during the day. Depression screen [facility] 2/9 09/19/2017 08/15/2017 02/02/2017  Decreased Interest 0 0 0  Down, Depressed, Hopeless 0 0 0  PHQ - 2 Score 0 0 0  Altered sleeping 0 0 -  Tired, decreased energy 1 0 -  Change in appetite 0 0 -  Feeling bad or failure about yourself  0 0 -  Trouble concentrating 0 0 -  Moving slowly or fidgety/restless 0 0 -  Suicidal thoughts 0 0 -  PHQ-9 Score 1 0 -   GAD 7 : Generalized Anxiety Score 09/19/2017 08/15/2017  Nervous, Anxious, on Edge 1 0  Control/stop worrying 1 1  Worry too much - different things 1 0  Trouble relaxing 0 0  Restless 0 0  Easily annoyed or irritable 0 0  Afraid - awful might happen 1 1  Total GAD 7 Score 4 2    Left hip and right knee- Her pain has been ongoing for about 6  months Her pain has been worse since the weather has been colder She describes as an aching and stiffness in her left hip and right knee She does not recall any injuries She has been applying biofreeze with some relief She has also tried to wear more supportive shoes - she stands at work all day She avoids OTC pain medications due to only having one kidney She c/o mild swelling in her left knee She denies fevers, rash, numbness, weakness, bowel or bladder incontinence, redness  Hypertension -maintained on amlodipine 5, benazepril 20, metoprolol succinate 50 daily. Reports daily medication compliance without adverse medication effects. Reports readings at home are 130s-140s/80s, she checks at least a couple of times each week. Denies headaches, vision changes, chest pain, shortness of breath, edema.  BP Readings from Last 3 Encounters:  09/19/17 (!) 152/80  08/15/17 (!) 146/94  05/23/17 (!) 144/90    Patient Active Problem List   Diagnosis Date Noted  . Anxiety and depression 05/23/2017  . Pansinusitis 08/22/2016  . Hypertriglyceridemia 04/21/2016  . Routine general medical examination at a health care facility 01/05/2016  . Medicare annual wellness visit, subsequent 01/05/2016  . Allergic rhinitis 12/29/2015  . Vaginal atrophy  10/22/2013  . DJD (degenerative joint disease) of knee 10/18/2012  . VERTIGO 09/30/2010  . NEPHROLITHIASIS, HX OF 05/28/2009  . NEPHRECTOMY, HX OF 05/28/2009  . Hypothyroidism, postsurgical 07/18/2007  . HYPERLIPIDEMIA 07/18/2007  . Essential hypertension 07/18/2007  . OSTEOPENIA 07/15/2007  . History of colonic polyps 07/15/2007    Past Surgical History:  Procedure Laterality Date  . BREAST EXCISIONAL BIOPSY Right 1983  . COLONOSCOPY W/ POLYPECTOMY  2003,2006,2013   [name]  . DILATION AND CURETTAGE OF UTERUS     X2  . NEPHRECTOMY  2010   Left for 9.8cm benign complex cyst  . THYROIDECTOMY  2000   [name]  . TONSILLECTOMY    . TOTAL  ABDOMINAL HYSTERECTOMY W/ BILATERAL SALPINGOOPHORECTOMY  2003   [name] for dysfunctional menses    Family History  Problem Relation Age of Onset  . Transient ischemic attack Father        in 67s  . Coronary artery disease Father        stents @ 46  . Nephrolithiasis Father   . Hyperlipidemia Mother   . Transient ischemic attack Mother 51  . Thyroid disease Mother        hyperthyroid  . Urolithiasis Sister   . Diabetes Neg Hx     Social History   Socioeconomic History  . Marital status: Married    Spouse name: Not on file  . Number of children: Not on file  . Years of education: Not on file  . Highest education level: Not on file  Social Needs  . Financial resource strain: Not on file  . Food insecurity - worry: Not on file  . Food insecurity - inability: Not on file  . Transportation needs - medical: Not on file  . Transportation needs - non-medical: Not on file  Occupational History  . Occupation: Sales  Tobacco Use  . Smoking status: Former Smoker    Packs/day: 0.50    Years: 4.00    Pack years: 2.00    Last attempt to quit: 08/29/1975    Years since quitting: 42.0  . Smokeless tobacco: Never Used  . Tobacco comment: smoked 1973-1977, up to 1/2 ppd  Substance and Sexual Activity  . Alcohol use: Yes    Alcohol/week: 0.0 oz    Comment: minimally  1 every 6 months  . Drug use: No  . Sexual activity: Yes    Comment: HYST  Other Topics Concern  . Not on file  Social History Narrative   Fun/Hobbies: Grandkids     Current Outpatient Medications:  .  acetaminophen (TYLENOL ARTHRITIS PAIN) 650 MG CR tablet, Take 650 mg by mouth 4 (four) times daily as needed. , Disp: , Rfl:  .  ALPRAZolam (XANAX) 0.5 MG tablet, Take 1-2 tablets (0.5-1 mg total) by mouth 2 (two) times daily as needed for anxiety or sleep., Disp: 60 tablet, Rfl: 1 .  amLODipine (NORVASC) 5 MG tablet, Take 1 tablet (5 mg total) by mouth daily. Must keep appt w/new provider for future refills, Disp:  90 tablet, Rfl: 1 .  aspirin 81 MG tablet, Take 81 mg by mouth daily.  , Disp: , Rfl:  .  atorvastatin (LIPITOR) 20 MG tablet, TAKE 1 TABLET BY MOUTH EVERY DAY AT 6PM, Disp: 90 tablet, Rfl: 0 .  benazepril (LOTENSIN) 20 MG tablet, TAKE 1.5 TABLETS (30 MG TOTAL) BY MOUTH DAILY., Disp: 135 tablet, Rfl: 1 .  Calcium Carbonate-Vitamin D (CALTRATE 600+D PO), Take by mouth daily.  , Disp: ,  Rfl:  .  fenofibrate 160 MG tablet, TAKE 1 TABLET BY MOUTH EVERY DAY, Disp: 90 tablet, Rfl: 1 .  levothyroxine (SYNTHROID, LEVOTHROID) 100 MCG tablet, TAKE 1 TABLET BY MOUTH EVERY DAY EXECPT 1 AND 1/2 TABLETS ON WEDNESDAYS, Disp: 90 tablet, Rfl: 0 .  metoprolol succinate (TOPROL-XL) 50 MG 24 hr tablet, TAKE 1 TABLET BY MOUTH EVERY DAY, Disp: 90 tablet, Rfl: 0 .  Multiple Vitamins-Minerals (CENTRUM PO), Take by mouth daily.  , Disp: , Rfl:  .  potassium citrate (UROCIT-K 10) 10 MEQ (1080 MG) SR tablet, Take 10 mEq by mouth daily. , Disp: , Rfl:   Allergies  Allergen Reactions  . Codeine Sulfate     "made me jumpy"  . Erythromycin     nausea     ROS See HPI  Objective  Vitals:   09/19/17 1130  BP: (!) 152/80  Pulse: 76  Resp: 16  Temp: 97.8 F (36.6 C)  TempSrc: Oral  SpO2: 97%  Weight: 157 lb 12.8 oz (71.6 kg)  Height: 5' 5.5" (1.664 m)    Body mass index is 25.86 kg/m.  Physical Exam Constitutional: Patient appears well-developed and well-nourished. No distress.  HENT: Head: Normocephalic and atraumatic. Nose: Nose normal. Mouth/Throat: Oropharynx is clear and moist. No oropharyngeal exudate.  Eyes: Conjunctivaenormal.  No scleral icterus.  Neck: Normal range of motion. Neck supple.  Cardiovascular: Normal rate, regular rhythm and normal heart sounds. No BLE edema. Distal pulses intact. Pulmonary/Chest: Effort normal and breath sounds normal. No respiratory distress. Abdominal: Soft. No distension. Musculoskeletal: Normal range of motion, No gross deformities. Mild swelling to right  knee. Tenderness to left hip on palpation.  Neurological: She is alert and oriented to person, place, and time.  Coordination, balance, strength, speech and gait are normal.  Skin: Skin is warm and dry. No rash noted. No erythema.  Psychiatric: Patient has a normal mood and affect. behavior is normal. Judgment and thought content normal.  PHQ2/9: Depression screen [facility] 2/9 08/15/2017 02/02/2017 01/05/2016 11/17/2015 10/16/2014  Decreased Interest 0 0 0 0 0  Down, Depressed, Hopeless 0 0 0 0 0  PHQ - 2 Score 0 0 0 0 0  Altered sleeping 0 - - - -  Tired, decreased energy 0 - - - -  Change in appetite 0 - - - -  Feeling bad or failure about yourself  0 - - - -  Trouble concentrating 0 - - - -  Moving slowly or fidgety/restless 0 - - - -  Suicidal thoughts 0 - - - -  PHQ-9 Score 0 - - - -    Fall Risk: Fall Risk  02/02/2017 01/05/2016 11/17/2015 10/16/2014 09/17/2013  Falls in the past year? No No No No No    Assessment & Plan RTC in about 2 weeks for BP follow up  Chronic pain of right knee - DG Knee Complete 4 Views Right; Future She would like to f/u with sports medicine in our office, she will schedule appointment today  Chronic left hip pain - DG HIP UNILAT WITH PELVIS 2-3 VIEWS LEFT; Future She would like to f/u with sports medicine in our office, she will schedule appointment today

## 2017-09-20 ENCOUNTER — Other Ambulatory Visit: Payer: Self-pay | Admitting: Nurse Practitioner

## 2017-09-20 ENCOUNTER — Other Ambulatory Visit: Payer: Self-pay | Admitting: Family

## 2017-09-20 DIAGNOSIS — F329 Major depressive disorder, single episode, unspecified: Secondary | ICD-10-CM

## 2017-09-20 DIAGNOSIS — F419 Anxiety disorder, unspecified: Principal | ICD-10-CM

## 2017-09-20 DIAGNOSIS — F32A Depression, unspecified: Secondary | ICD-10-CM

## 2017-09-20 MED ORDER — ALPRAZOLAM 0.5 MG PO TABS: 0.5000 mg | ORAL_TABLET | Freq: Every evening | ORAL | 1 refills | Status: DC | PRN

## 2017-09-20 MED ORDER — ALPRAZOLAM 0.5 MG PO TABS
0.5000 mg | ORAL_TABLET | Freq: Every evening | ORAL | 1 refills | Status: DC | PRN
Start: 2017-09-20 — End: 2017-09-20

## 2017-09-26 ENCOUNTER — Other Ambulatory Visit: Payer: Self-pay | Admitting: Family

## 2017-09-27 ENCOUNTER — Ambulatory Visit (INDEPENDENT_AMBULATORY_CARE_PROVIDER_SITE_OTHER): Payer: Medicare Other | Admitting: Family Medicine

## 2017-09-27 ENCOUNTER — Encounter: Payer: Self-pay | Admitting: Family Medicine

## 2017-09-27 VITALS — BP 142/78 | HR 64 | Temp 97.7°F | Ht 65.5 in | Wt 156.0 lb

## 2017-09-27 DIAGNOSIS — J069 Acute upper respiratory infection, unspecified: Secondary | ICD-10-CM | POA: Diagnosis not present

## 2017-09-27 NOTE — Progress Notes (Deleted)
[name] - 72 y.o. female MRN [id number]  Date of birth: Nov 15, 1945  SUBJECTIVE:  Including CC & ROS.  Chief Complaint  Patient presents with  . Sinus pressure    [name] is a 72 y.o. female that is presenting with sinus pressure. Ongoing for three days. She has been taking Mucinex with no improvement. Denies fevers or chills. Admits to greenish mucous.   Review of Systems  HISTORY: Past Medical, Surgical, Social, and Family History Reviewed & Updated per EMR.   Pertinent Historical Findings include:  Past Medical History:  Diagnosis Date  . Adenomatous colon polyp   . Arthritis   . Cataract   . Heart murmur    MVP  . Hyperlipidemia   . Hypertension   . Thyroid disease     Past Surgical History:  Procedure Laterality Date  . BREAST EXCISIONAL BIOPSY Right 1983  . COLONOSCOPY W/ POLYPECTOMY  2003,2006,2013   [name]  . DILATION AND CURETTAGE OF UTERUS     X2  . NEPHRECTOMY  2010   Left for 9.8cm benign complex cyst  . THYROIDECTOMY  2000   [name]  . TONSILLECTOMY    . TOTAL ABDOMINAL HYSTERECTOMY W/ BILATERAL SALPINGOOPHORECTOMY  2003   [name] for dysfunctional menses    Allergies  Allergen Reactions  . Codeine Sulfate     "made me jumpy"  . Erythromycin     nausea    Family History  Problem Relation Age of Onset  . Transient ischemic attack Father        in 82s  . Coronary artery disease Father        stents @ 86  . Nephrolithiasis Father   . Hyperlipidemia Mother   . Transient ischemic attack Mother 65  . Thyroid disease Mother        hyperthyroid  . Urolithiasis Sister   . Diabetes Neg Hx      Social History   Socioeconomic History  . Marital status: Married    Spouse name: Not on file  . Number of children: Not on file  . Years of education: Not on file  . Highest education level: Not on file  Social Needs  . Financial resource strain: Not on file  . Food insecurity - worry: Not on file  . Food insecurity - inability:  Not on file  . Transportation needs - medical: Not on file  . Transportation needs - non-medical: Not on file  Occupational History  . Occupation: Sales  Tobacco Use  . Smoking status: Former Smoker    Packs/day: 0.50    Years: 4.00    Pack years: 2.00    Last attempt to quit: 08/29/1975    Years since quitting: 42.1  . Smokeless tobacco: Never Used  . Tobacco comment: smoked 1973-1977, up to 1/2 ppd  Substance and Sexual Activity  . Alcohol use: Yes    Alcohol/week: 0.0 oz    Comment: minimally  1 every 6 months  . Drug use: No  . Sexual activity: Yes    Comment: HYST  Other Topics Concern  . Not on file  Social History Narrative   Fun/Hobbies: Grandkids     PHYSICAL EXAM:  VS: BP (!) 142/78 (BP Location: Left Arm, Patient Position: Sitting, Cuff Size: Normal)   Pulse 64   Temp 97.7 F (36.5 C) (Oral)   Ht 5' 5.5" (1.664 m)   Wt 156 lb (70.8 kg)   SpO2 97%   BMI 25.56 kg/m  Physical Exam Gen: NAD, alert, cooperative with exam, well-appearing ENT: normal lips, normal nasal mucosa,  Eye: normal EOM, normal conjunctiva and lids CV:  no edema, +2 pedal pulses   Resp: no accessory muscle use, non-labored,  GI: no masses or tenderness, no hernia  Skin: no rashes, no areas of induration  Neuro: normal tone, normal sensation to touch Psych:  normal insight, alert and oriented MSK:  ***      ASSESSMENT & PLAN:   No problem-specific Assessment & Plan notes found for this encounter.

## 2017-09-27 NOTE — Patient Instructions (Signed)
Please try things such as zyrtec-D or allegra-D which is an antihistamine and decongestant.   Please try afrin which will help with nasal congestion but use for only three days.   Please also try using a netti pot on a regular occasion.  Honey can help with a sore throat.

## 2017-09-27 NOTE — Progress Notes (Signed)
[name] - 72 y.o. female MRN [id number]  Date of birth: 1946-03-17  SUBJECTIVE:  Including CC & ROS.  Chief Complaint  Patient presents with  . Sinus pressure    [name] is a 72 y.o. female that is with sinus pressure. Ongoing for three days. She has been taking Mucinex with no improvement. Denies fevers or chills. Admits to greenish mucous. Has been watching her grandchildren and they have had similar symptoms.    Review of Systems  Constitutional: Negative for fever.  HENT: Positive for sinus pain.   Eyes: Negative for photophobia.  Cardiovascular: Negative for chest pain.  Gastrointestinal: Negative for abdominal pain.    HISTORY: Past Medical, Surgical, Social, and Family History Reviewed & Updated per EMR.   Pertinent Historical Findings include:  Past Medical History:  Diagnosis Date  . Adenomatous colon polyp   . Arthritis   . Cataract   . Heart murmur    MVP  . Hyperlipidemia   . Hypertension   . Thyroid disease     Past Surgical History:  Procedure Laterality Date  . BREAST EXCISIONAL BIOPSY Right 1983  . COLONOSCOPY W/ POLYPECTOMY  2003,2006,2013   [name]  . DILATION AND CURETTAGE OF UTERUS     X2  . NEPHRECTOMY  2010   Left for 9.8cm benign complex cyst  . THYROIDECTOMY  2000   [name]  . TONSILLECTOMY    . TOTAL ABDOMINAL HYSTERECTOMY W/ BILATERAL SALPINGOOPHORECTOMY  2003   [name] for dysfunctional menses    Allergies  Allergen Reactions  . Codeine Sulfate     "made me jumpy"  . Erythromycin     nausea    Family History  Problem Relation Age of Onset  . Transient ischemic attack Father        in 63s  . Coronary artery disease Father        stents @ 8  . Nephrolithiasis Father   . Hyperlipidemia Mother   . Transient ischemic attack Mother 55  . Thyroid disease Mother        hyperthyroid  . Urolithiasis Sister   . Diabetes Neg Hx      Social History   Socioeconomic History  . Marital status: Married    Spouse  name: Not on file  . Number of children: Not on file  . Years of education: Not on file  . Highest education level: Not on file  Social Needs  . Financial resource strain: Not on file  . Food insecurity - worry: Not on file  . Food insecurity - inability: Not on file  . Transportation needs - medical: Not on file  . Transportation needs - non-medical: Not on file  Occupational History  . Occupation: Sales  Tobacco Use  . Smoking status: Former Smoker    Packs/day: 0.50    Years: 4.00    Pack years: 2.00    Last attempt to quit: 08/29/1975    Years since quitting: 42.1  . Smokeless tobacco: Never Used  . Tobacco comment: smoked 1973-1977, up to 1/2 ppd  Substance and Sexual Activity  . Alcohol use: Yes    Alcohol/week: 0.0 oz    Comment: minimally  1 every 6 months  . Drug use: No  . Sexual activity: Yes    Comment: HYST  Other Topics Concern  . Not on file  Social History Narrative   Fun/Hobbies: Grandkids     PHYSICAL EXAM:  VS: BP (!) 142/78 (BP Location: Left Arm,  Patient Position: Sitting, Cuff Size: Normal)   Pulse 64   Temp 97.7 F (36.5 C) (Oral)   Ht 5' 5.5" (1.664 m)   Wt 156 lb (70.8 kg)   SpO2 97%   BMI 25.56 kg/m  Physical Exam Gen: NAD, alert, cooperative with exam,  ENT: normal lips, normal nasal mucosa, tympanic membranes clear and intact bilaterally, normal oropharynx, no cervical lymphadenopathy Eye: normal EOM, normal conjunctiva and lids CV:  no edema, +2 pedal pulses, regular rate and rhythm, S1-S2   Resp: no accessory muscle use, non-labored, clear to auscultation bilaterally, no crackles or wheezes Skin: no rashes, no areas of induration  Neuro: normal tone, normal sensation to touch Psych:  normal insight, alert and oriented MSK: Normal gait, normal strength       ASSESSMENT & PLAN:   Upper respiratory tract infection Likely viral in nature.  - counseled on supportive  - f/u PRN

## 2017-09-27 NOTE — Assessment & Plan Note (Signed)
Likely viral in nature.  - counseled on supportive  - f/u PRN

## 2017-09-28 ENCOUNTER — Other Ambulatory Visit: Payer: Self-pay | Admitting: Nurse Practitioner

## 2017-10-02 NOTE — Progress Notes (Signed)
[facility] [address], [place] [address] Phone: [phone] Subjective:    I'm seeing this patient by the request  of:    CC: Left hip and right knee pain  JHE:[id number]  [name] is a 72 y.o. female coming in with complaint of left hip and right knee pain. Has history of bursitis in her hip. She did have a fall during the snow recently. Has had injection in hip that did help 5 years ago.   She has been having right knee pain for 2 months. She describes a pulling sensation on the lateral aspect. No history of injury to knee.   Left hip Onset- years Location- greater trochanter Duration- intermittent Character- dull Aggravating factors- stairs Reliving factors-  Therapies tried- stretching Severity-   Right knee pain Onset- 2 months ago Location-  Posterior, lateral knee Duration- intermittent Character- sharp Aggravating factors- stairs Reliving factors-  Therapies tried- stretching Severity-     Past Medical History:  Diagnosis Date  . Adenomatous colon polyp   . Arthritis   . Cataract   . Heart murmur    MVP  . Hyperlipidemia   . Hypertension   . Thyroid disease    Past Surgical History:  Procedure Laterality Date  . BREAST EXCISIONAL BIOPSY Right 1983  . COLONOSCOPY W/ POLYPECTOMY  2003,2006,2013   [name]  . DILATION AND CURETTAGE OF UTERUS     X2  . NEPHRECTOMY  2010   Left for 9.8cm benign complex cyst  . THYROIDECTOMY  2000   [name]  . TONSILLECTOMY    . TOTAL ABDOMINAL HYSTERECTOMY W/ BILATERAL SALPINGOOPHORECTOMY  2003   [name] for dysfunctional menses   Social History   Socioeconomic History  . Marital status: Married    Spouse name: Not on file  . Number of children: Not on file  . Years of education: Not on file  . Highest education level: Not on file  Social Needs  . Financial resource strain: Not on file  . Food insecurity - worry: Not on file  . Food insecurity - inability:  Not on file  . Transportation needs - medical: Not on file  . Transportation needs - non-medical: Not on file  Occupational History  . Occupation: Sales  Tobacco Use  . Smoking status: Former Smoker    Packs/day: 0.50    Years: 4.00    Pack years: 2.00    Last attempt to quit: 08/29/1975    Years since quitting: 42.1  . Smokeless tobacco: Never Used  . Tobacco comment: smoked 1973-1977, up to 1/2 ppd  Substance and Sexual Activity  . Alcohol use: Yes    Alcohol/week: 0.0 oz    Comment: minimally  1 every 6 months  . Drug use: No  . Sexual activity: Yes    Comment: HYST  Other Topics Concern  . Not on file  Social History Narrative   Fun/Hobbies: Grandkids   Allergies  Allergen Reactions  . Codeine Sulfate     "made me jumpy"  . Erythromycin     nausea   Family History  Problem Relation Age of Onset  . Transient ischemic attack Father        in 13s  . Coronary artery disease Father        stents @ 33  . Nephrolithiasis Father   . Hyperlipidemia Mother   . Transient ischemic attack Mother 8  . Thyroid disease Mother  hyperthyroid  . Urolithiasis Sister   . Diabetes Neg Hx      Past medical history, social, surgical and family history all reviewed in electronic medical record.  No pertanent information unless stated regarding to the chief complaint.   Review of Systems:Review of systems updated and as accurate as of 10/02/17  No headache, visual changes, nausea, vomiting, diarrhea, constipation, dizziness, abdominal pain, skin rash, fevers, chills, night sweats, weight loss, swollen lymph nodes, body aches, joint swelling, muscle aches, chest pain, shortness of breath, mood changes.   Objective  There were no vitals taken for this visit. Systems examined below as of 10/02/17   General: No apparent distress alert and oriented x3 mood and affect normal, dressed appropriately.  HEENT: Pupils equal, extraocular movements intact  Respiratory: Patient's speak  in full sentences and does not appear short of breath  Cardiovascular: No lower extremity edema, non tender, no erythema  Skin: Warm dry intact with no signs of infection or rash on extremities or on axial skeleton.  Abdomen: Soft nontender  Neuro: Cranial nerves II through XII are intact, neurovascularly intact in all extremities with 2+ DTRs and 2+ pulses.  Lymph: No lymphadenopathy of posterior or anterior cervical chain or axillae bilaterally.  Gait normal with good balance and coordination.  MSK:  Non tender with full range of motion and good stability and symmetric strength and tone of shoulders, elbows, wrist, and ankles bilaterally.  Hip: Left ROM IR: 15 Deg, ER: 35 Deg, Flexion: 120 Deg, Extension: 100 Deg, Abduction: 45 Deg, Adduction: 25 Deg Strength IR: 5/5, ER: 5/5, Flexion: 5/5, Extension: 5/5, Abduction: 4/5, Adduction: 5/5 Pelvic alignment unremarkable to inspection and palpation. Standing hip rotation and gait without trendelenburg sign / unsteadiness. Greater trochanter without tenderness to palpation. No tenderness over piriformis and greater trochanter. Pain with Corky Sox and internal rotation No SI joint tenderness and normal minimal SI movement.   Knee: Right Normal to inspection with no erythema or effusion or obvious bony abnormalities. Lateral aspect of the knee ROM full in flexion and extension and lower leg rotation. Ligaments with solid consistent endpoints including ACL, PCL, LCL, MCL. Positive Mcmurray's, Apley's, and Thessalonian tests. Non painful patellar compression. Patellar glide with mild crepitus. Patellar and quadriceps tendons unremarkable. Hamstring and quadriceps strength is normal. Contralateral knee unremarkable   Procedure: Real-time Ultrasound Guided Injection of left greater trochanteric bursitis secondary to patient's body habitus Device: GE Logiq Q7 Ultrasound guided injection is preferred based studies that show increased duration,  increased effect, greater accuracy, decreased procedural pain, increased response rate, and decreased cost with ultrasound guided versus blind injection.  Verbal informed consent obtained.  Time-out conducted.  Noted no overlying erythema, induration, or other signs of local infection.  Skin prepped in a sterile fashion.  Local anesthesia: Topical Ethyl chloride.  With sterile technique and under real time ultrasound guidance:  Greater trochanteric area was visualized and patient's bursa was noted. A 22-gauge 3 inch needle was inserted and 4 cc of 0.5% Marcaine and 1 cc of Kenalog 40 mg/dL was injected. Pictures taken Completed without difficulty  Pain immediately resolved suggesting accurate placement of the medication.  Advised to call if fevers/chills, erythema, induration, drainage, or persistent bleeding.  Images permanently stored and available for review in the ultrasound unit.  Impression: Technically successful ultrasound guided injection.   [id number]; 15 additional minutes spent for Therapeutic exercises as stated in above notes.  This included exercises focusing on stretching, strengthening, with significant focus on eccentric aspects.  Long term goals include an improvement in range of motion, strength, endurance as well as avoiding reinjury. Patient's frequency would include in 1-2 times a day, 3-5 times a week for a duration of 6-12 weeks.Hip strengthening exercises which included:  Pelvic tilt/bracing to help with proper recruitment of the lower abs and pelvic floor muscles  Glute strengthening to properly contract glutes without over-engaging low back and hamstrings - prone hip extension and glute bridge exercises Proper stretching techniques to increase effectiveness for the hip flexors, groin, quads, piriformic and low back when appropriate     Proper technique shown and discussed handout in great detail with ATC.  All questions were discussed and answered.     Impression and  Recommendations:     This case required medical decision making of moderate complexity.      Note: This dictation was prepared with Dragon dictation along with smaller phrase technology. Any transcriptional errors that result from this process are unintentional.

## 2017-10-03 ENCOUNTER — Ambulatory Visit: Payer: Self-pay

## 2017-10-03 ENCOUNTER — Encounter: Payer: Self-pay | Admitting: Family Medicine

## 2017-10-03 ENCOUNTER — Ambulatory Visit: Payer: Medicare Other | Admitting: Family Medicine

## 2017-10-03 VITALS — BP 140/82 | HR 86 | Ht 65.5 in | Wt 152.0 lb

## 2017-10-03 DIAGNOSIS — M7062 Trochanteric bursitis, left hip: Secondary | ICD-10-CM | POA: Diagnosis not present

## 2017-10-03 DIAGNOSIS — S83281A Other tear of lateral meniscus, current injury, right knee, initial encounter: Secondary | ICD-10-CM | POA: Insufficient documentation

## 2017-10-03 DIAGNOSIS — M25561 Pain in right knee: Principal | ICD-10-CM

## 2017-10-03 DIAGNOSIS — G8929 Other chronic pain: Secondary | ICD-10-CM

## 2017-10-03 NOTE — Assessment & Plan Note (Signed)
Injected today.  Tolerated the procedure.  Discussed icing regimen and home exercises.  Discussed protective avoid.  Follow-up again in 4 weeks discussed icing regimen.

## 2017-10-03 NOTE — Patient Instructions (Signed)
Good to see you  Alvera Singh is your friend.  Ice 20 minutes 2 times daily. Usually after activity and before bed. Compression sleeve to the knee Exercises 3 times a week.  Alternate hip and knee.  pennsaid pinkie amount topically 2 times daily as needed.  Over the counter  Tart cherry extract any dose at night Vitamin D 2000 IU daily  Avoid twisting or deep squats of the knee See me again in 4 weeks

## 2017-10-03 NOTE — Assessment & Plan Note (Signed)
Lateral meniscus of the right knee.  Home x-ray.  Follow-up again in 4 weeks

## 2017-10-10 ENCOUNTER — Ambulatory Visit (INDEPENDENT_AMBULATORY_CARE_PROVIDER_SITE_OTHER): Payer: Medicare Other | Admitting: Nurse Practitioner

## 2017-10-10 ENCOUNTER — Ambulatory Visit: Payer: Medicare Other | Admitting: Family Medicine

## 2017-10-10 ENCOUNTER — Encounter: Payer: Self-pay | Admitting: Nurse Practitioner

## 2017-10-10 VITALS — BP 148/82 | HR 76 | Temp 98.0°F | Resp 16 | Ht 65.5 in | Wt 155.0 lb

## 2017-10-10 DIAGNOSIS — E782 Mixed hyperlipidemia: Secondary | ICD-10-CM | POA: Diagnosis not present

## 2017-10-10 DIAGNOSIS — E890 Postprocedural hypothyroidism: Secondary | ICD-10-CM

## 2017-10-10 DIAGNOSIS — I10 Essential (primary) hypertension: Secondary | ICD-10-CM

## 2017-10-10 NOTE — Assessment & Plan Note (Signed)
Continue current dosage of synthroid, will update TSH - TSH; Future

## 2017-10-10 NOTE — Assessment & Plan Note (Addendum)
BP appears to increase in clinical setting. We will continue medications at current dosages and she will continue to monitor BP at home, she will notify me of elevated readings at home. We could consider increasing her amlodipine to 10 at next follow up if her blood pressure is still on the higher end of normal - Comprehensive metabolic panel; Future

## 2017-10-10 NOTE — Assessment & Plan Note (Signed)
Continue fenofibrate, update lipid panel - Lipid panel; Future

## 2017-10-10 NOTE — Patient Instructions (Addendum)
Please return to the lab downstairs for labwork when fasting- only water or black coffee for 8 hours prior  Please return after June 8 for your annual physical.  It was good to see you. Thanks for letting me take care of you today :)

## 2017-10-10 NOTE — Progress Notes (Signed)
Name: [name]   MRN: [id number]    DOB: 07/31/1946   Date:10/10/2017       Progress Note  Subjective  Chief Complaint  Chief Complaint  Patient presents with  . Follow-up    hypertension    HPI  Hypertension -maintained on amlodipine 5, benazepril 20, metoprolol succinate 50 daily and reports medication compliance. She has had elevated readings on past several visits to the clinic but checks her blood pressure daily at home with normal readings During her last visit we discussed her keeping a BP log with home readings and returning for a follow up with her log and her blood pressure cuff for calibration. Today, she brings her log with readings 130s-140s/80s at home. She says she just feels like her blood pressure elevates when she is in the clnical setting. Denies headaches, vision changes, chest pain, shortness of breath, edema.  BP Readings from Last 3 Encounters:  10/10/17 (!) 148/82  10/03/17 140/82  09/27/17 (!) 142/78   Hypothryoidism-maintained on synthroid S/p total thyroidectomy in 2000. She says she takes the synthroid daily as prescribed and tolerates the medication well Denies fatigue, weight gain, decreased appetite, palpitations, constipation, dry skin  Lab Results  Component Value Date   TSH 0.72 10/20/2015   Cholesterol- maintained on fenofibrate She was also maintained on atorvastatin but stopped it because she felt it made her legs feel heavy and achy. She has continued to take the fenofibrate daily.  Lab Results  Component Value Date   CHOL 271 (H) 02/09/2017   HDL 45.80 02/09/2017   LDLCALC 90 07/11/2007   LDLDIRECT 165.0 02/09/2017   TRIG 248.0 (H) 02/09/2017   CHOLHDL 6 02/09/2017     Patient Active Problem List   Diagnosis Date Noted  . Anxiety and depression 05/23/2017  . Hypertriglyceridemia 04/21/2016  . Routine general medical examination at a health care facility 01/05/2016  . Medicare annual wellness visit, subsequent 01/05/2016   . Allergic rhinitis 12/29/2015  . Vaginal atrophy 10/22/2013  . DJD (degenerative joint disease) of knee 10/18/2012  . NEPHRECTOMY, HX OF 05/28/2009  . Hypothyroidism, postsurgical 07/18/2007  . HYPERLIPIDEMIA 07/18/2007  . Essential hypertension 07/18/2007  . OSTEOPENIA 07/15/2007  . History of colonic polyps 07/15/2007    Past Surgical History:  Procedure Laterality Date  . BREAST EXCISIONAL BIOPSY Right 1983  . COLONOSCOPY W/ POLYPECTOMY  2003,2006,2013   [name]  . DILATION AND CURETTAGE OF UTERUS     X2  . NEPHRECTOMY  2010   Left for 9.8cm benign complex cyst  . THYROIDECTOMY  2000   [name]  . TONSILLECTOMY    . TOTAL ABDOMINAL HYSTERECTOMY W/ BILATERAL SALPINGOOPHORECTOMY  2003   [name] for dysfunctional menses    Family History  Problem Relation Age of Onset  . Transient ischemic attack Father        in 44s  . Coronary artery disease Father        stents @ 87  . Nephrolithiasis Father   . Hyperlipidemia Mother   . Transient ischemic attack Mother 65  . Thyroid disease Mother        hyperthyroid  . Urolithiasis Sister   . Diabetes Neg Hx     Social History   Socioeconomic History  . Marital status: Married    Spouse name: Not on file  . Number of children: Not on file  . Years of education: Not on file  . Highest education level: Not on file  Social Needs  .  Financial resource strain: Not on file  . Food insecurity - worry: Not on file  . Food insecurity - inability: Not on file  . Transportation needs - medical: Not on file  . Transportation needs - non-medical: Not on file  Occupational History  . Occupation: Sales  Tobacco Use  . Smoking status: Former Smoker    Packs/day: 0.50    Years: 4.00    Pack years: 2.00    Last attempt to quit: 08/29/1975    Years since quitting: 42.1  . Smokeless tobacco: Never Used  . Tobacco comment: smoked 1973-1977, up to 1/2 ppd  Substance and Sexual Activity  . Alcohol use: Yes    Alcohol/week: 0.0 oz     Comment: minimally  1 every 6 months  . Drug use: No  . Sexual activity: Yes    Comment: HYST  Other Topics Concern  . Not on file  Social History Narrative   Fun/Hobbies: Grandkids     Current Outpatient Medications:  .  acetaminophen (TYLENOL ARTHRITIS PAIN) 650 MG CR tablet, Take 650 mg by mouth 4 (four) times daily as needed. , Disp: , Rfl:  .  ALPRAZolam (XANAX) 0.5 MG tablet, Take 1 tablet (0.5 mg total) by mouth at bedtime as needed for anxiety or sleep., Disp: 30 tablet, Rfl: 1 .  amLODipine (NORVASC) 5 MG tablet, Take 1 tablet (5 mg total) by mouth daily. Must keep appt w/new provider for future refills, Disp: 90 tablet, Rfl: 1 .  aspirin 81 MG tablet, Take 81 mg by mouth daily.  , Disp: , Rfl:  .  benazepril (LOTENSIN) 20 MG tablet, TAKE 1 AND 1/2 TABLETS (30 MG TOTAL) BY MOUTH DAILY., Disp: 135 tablet, Rfl: 1 .  Calcium Carbonate-Vitamin D (CALTRATE 600+D PO), Take by mouth daily.  , Disp: , Rfl:  .  fenofibrate 160 MG tablet, TAKE 1 TABLET BY MOUTH EVERY DAY, Disp: 90 tablet, Rfl: 1 .  levothyroxine (SYNTHROID, LEVOTHROID) 100 MCG tablet, TAKE 1 TABLET BY MOUTH EVERY DAY EXECPT 1 AND 1/2 TABLETS ON WEDNESDAYS, Disp: 90 tablet, Rfl: 0 .  metoprolol succinate (TOPROL-XL) 50 MG 24 hr tablet, TAKE 1 TABLET BY MOUTH EVERY DAY, Disp: 30 tablet, Rfl: 0 .  Misc Natural Products (TART CHERRY ADVANCED PO), Take by mouth., Disp: , Rfl:  .  Multiple Vitamins-Minerals (CENTRUM PO), Take by mouth daily.  , Disp: , Rfl:  .  potassium citrate (UROCIT-K 10) 10 MEQ (1080 MG) SR tablet, Take 10 mEq by mouth daily. , Disp: , Rfl:   Allergies  Allergen Reactions  . Codeine Sulfate     "made me jumpy"  . Erythromycin     nausea     ROS See HPI  Objective  Vitals:   10/10/17 1133  BP: (!) 148/82  Pulse: 76  Resp: 16  Temp: 98 F (36.7 C)  TempSrc: Oral  SpO2: 98%  Weight: 155 lb (70.3 kg)  Height: 5' 5.5" (1.664 m)   Body mass index is 25.4 kg/m.  Physical Exam Vital  signs reviewed Constitutional: Patient appears well-developed and well-nourished. No distress.  HENT: Head: Normocephalic and atraumatic. Nose: Nose normal. Mouth/Throat: Oropharynx is clear and moist. No oropharyngeal exudate.  Eyes: Conjunctivae normal.  No scleral icterus.  Neck: Normal range of motion. Neck supple.  Cardiovascular: Normal rate, regular rhythm and normal heart sounds. No BLE edema. Distal pulses intact. Pulmonary/Chest: Effort normal and breath sounds normal. No respiratory distress. Abdominal: Soft. No distension. Musculoskeletal: Normal range of  motion, No gross deformities. Mild swelling to right knee. Tenderness to left hip on palpation.  Neurological: She is alert and oriented to person, place, and time.  Coordination, balance, strength, speech and gait are normal.  Skin: Skin is warm and dry. No rash noted. No erythema.  Psychiatric: Patient has a normal mood and affect. behavior is normal. Judgment and thought content normal.  Assessment & Plan RTC in about 3 months for CPE

## 2017-10-13 ENCOUNTER — Other Ambulatory Visit: Payer: Self-pay | Admitting: Nurse Practitioner

## 2017-10-13 DIAGNOSIS — F329 Major depressive disorder, single episode, unspecified: Secondary | ICD-10-CM

## 2017-10-13 DIAGNOSIS — F419 Anxiety disorder, unspecified: Principal | ICD-10-CM

## 2017-10-13 DIAGNOSIS — F32A Depression, unspecified: Secondary | ICD-10-CM

## 2017-10-18 ENCOUNTER — Telehealth: Payer: Self-pay

## 2017-10-18 NOTE — Telephone Encounter (Signed)
Spoke with patient who called in due to the injection for her hip not lasting more than one week. Told her we would call if there is a cancellation next week. Instructed her to use ice for 20 minutes, pennsaid, and continue with supplements that Dr. Tamala Julian recommended until appointment on 10/31/17. Patient voices understanding.

## 2017-10-24 ENCOUNTER — Ambulatory Visit: Payer: Self-pay | Admitting: *Deleted

## 2017-10-24 DIAGNOSIS — H524 Presbyopia: Secondary | ICD-10-CM | POA: Diagnosis not present

## 2017-10-24 DIAGNOSIS — H5213 Myopia, bilateral: Secondary | ICD-10-CM | POA: Diagnosis not present

## 2017-10-24 DIAGNOSIS — H2513 Age-related nuclear cataract, bilateral: Secondary | ICD-10-CM | POA: Diagnosis not present

## 2017-10-24 NOTE — Telephone Encounter (Signed)
Pt  Reports    l  Hip   Pain   For several  Months  Pt   Reports    Pain  Has  Become much  Worse  Over  Last  10  Days -  Pt  Reports  She  Has  Been taking    otc   Motrin   For  About  5  Days.  Pt has  An appointment  In  1   Week  With Dr  Tamala Julian. No  Availability until  Then .  Reason for Disposition . [1] MODERATE pain (e.g., interferes with normal activities, limping) AND [2] present > 3 days  Answer Assessment - Initial Assessment Questions 1. LOCATION and RADIATION: "Where is the pain located?"        Left   Hip  Pain  2. QUALITY: "What does the pain feel like?"  (e.g., sharp, dull, aching, burning)       Aching   And  Sharp    3. SEVERITY: "How bad is the pain?" "What does it keep you from doing?"   (Scale 1-10; or mild, moderate, severe)   -  MILD (1-3): doesn't interfere with normal activities    -  MODERATE (4-7): interferes with normal activities (e.g., work or school) or awakens from sleep, limping    -  SEVERE (8-10): excruciating pain, unable to do any normal activities, unable to walk       7  4. ONSET: "When did the pain start?" "Does it come and go, or is it there all the time?"       Has  Had  Pain  Past   Worse  Over  Last  10  Days    5. WORK OR EXERCISE: "Has there been any recent work or exercise that involved this part of the body?"     no 6. CAUSE: "What do you think is causing the hip pain?"      Possible  Fall   2  Months  Ago    7. AGGRAVATING FACTORS: "What makes the hip pain worse?" (e.g., walking, climbing stairs, running)    Certain movements    8. OTHER SYMPTOMS: "Do you have any other symptoms?" (e.g., back pain, pain shooting down leg,  fever, rash)  Protocols used: HIP PAIN-A-AH

## 2017-10-24 NOTE — Progress Notes (Signed)
Subjective:    Patient ID: [name], female    DOB: June 30, 1946, 72 y.o.   MRN: [id number]  HPI The patient is here for an acute visit.     Hip pain:  Her hip pain started several months ago. She slipped on snow and fell.  It is worse over the past 10 days.  It is a sharp and aching pain.  She has some numbness/tingling in her left groin.   She is having difficulty lifting the leg up to go up stairs and getting in the car. She has been taking motrin and aleve, but only has one kidney.  She has tried topical treatments.    She has already seen Dr. Tamala Julian for this and she has an appointment with him on 10/31/17.     Medications and allergies reviewed with patient and updated if appropriate.  Patient Active Problem List   Diagnosis Date Noted  . Anxiety and depression 05/23/2017  . Hypertriglyceridemia 04/21/2016  . Routine general medical examination at a health care facility 01/05/2016  . Medicare annual wellness visit, subsequent 01/05/2016  . Allergic rhinitis 12/29/2015  . Vaginal atrophy 10/22/2013  . DJD (degenerative joint disease) of knee 10/18/2012  . NEPHRECTOMY, HX OF 05/28/2009  . Hypothyroidism, postsurgical 07/18/2007  . HYPERLIPIDEMIA 07/18/2007  . Essential hypertension 07/18/2007  . OSTEOPENIA 07/15/2007  . History of colonic polyps 07/15/2007    Current Outpatient Medications on File Prior to Visit  Medication Sig Dispense Refill  . acetaminophen (TYLENOL ARTHRITIS PAIN) 650 MG CR tablet Take 650 mg by mouth 4 (four) times daily as needed.     . ALPRAZolam (XANAX) 0.5 MG tablet Take 1 tablet (0.5 mg total) by mouth at bedtime as needed for anxiety or sleep. 30 tablet 1  . amLODipine (NORVASC) 5 MG tablet Take 1 tablet (5 mg total) by mouth daily. Must keep appt w/new provider for future refills 90 tablet 1  . aspirin 81 MG tablet Take 81 mg by mouth daily.      . benazepril (LOTENSIN) 20 MG tablet TAKE 1 AND 1/2 TABLETS (30 MG TOTAL) BY MOUTH DAILY. 135  tablet 1  . Calcium Carbonate-Vitamin D (CALTRATE 600+D PO) Take by mouth daily.      . fenofibrate 160 MG tablet TAKE 1 TABLET BY MOUTH EVERY DAY 90 tablet 1  . levothyroxine (SYNTHROID, LEVOTHROID) 100 MCG tablet TAKE 1 TABLET BY MOUTH EVERY DAY EXECPT 1 AND 1/2 TABLETS ON WEDNESDAYS 90 tablet 0  . metoprolol succinate (TOPROL-XL) 50 MG 24 hr tablet TAKE 1 TABLET BY MOUTH EVERY DAY 30 tablet 0  . Misc Natural Products (TART CHERRY ADVANCED PO) Take by mouth.    . Multiple Vitamins-Minerals (CENTRUM PO) Take by mouth daily.      . potassium citrate (UROCIT-K 10) 10 MEQ (1080 MG) SR tablet Take 10 mEq by mouth daily.     . [DISCONTINUED] simvastatin (ZOCOR) 20 MG tablet Take 20 mg by mouth at bedtime.       No current facility-administered medications on file prior to visit.     Past Medical History:  Diagnosis Date  . Adenomatous colon polyp   . Arthritis   . Cataract   . Heart murmur    MVP  . Hyperlipidemia   . Hypertension   . Thyroid disease     Past Surgical History:  Procedure Laterality Date  . BREAST EXCISIONAL BIOPSY Right 1983  . COLONOSCOPY W/ POLYPECTOMY  2003,2006,2013   [name]  .  DILATION AND CURETTAGE OF UTERUS     X2  . NEPHRECTOMY  2010   Left for 9.8cm benign complex cyst  . THYROIDECTOMY  2000   [name]  . TONSILLECTOMY    . TOTAL ABDOMINAL HYSTERECTOMY W/ BILATERAL SALPINGOOPHORECTOMY  2003   [name] for dysfunctional menses    Social History   Socioeconomic History  . Marital status: Married    Spouse name: None  . Number of children: None  . Years of education: None  . Highest education level: None  Social Needs  . Financial resource strain: None  . Food insecurity - worry: None  . Food insecurity - inability: None  . Transportation needs - medical: None  . Transportation needs - non-medical: None  Occupational History  . Occupation: Sales  Tobacco Use  . Smoking status: Former Smoker    Packs/day: 0.50    Years: 4.00    Pack  years: 2.00    Last attempt to quit: 08/29/1975    Years since quitting: 42.1  . Smokeless tobacco: Never Used  . Tobacco comment: smoked 1973-1977, up to 1/2 ppd  Substance and Sexual Activity  . Alcohol use: Yes    Alcohol/week: 0.0 oz    Comment: minimally  1 every 6 months  . Drug use: No  . Sexual activity: Yes    Comment: HYST  Other Topics Concern  . None  Social History Narrative   Fun/Hobbies: Grandkids    Family History  Problem Relation Age of Onset  . Transient ischemic attack Father        in 55s  . Coronary artery disease Father        stents @ 14  . Nephrolithiasis Father   . Hyperlipidemia Mother   . Transient ischemic attack Mother 64  . Thyroid disease Mother        hyperthyroid  . Urolithiasis Sister   . Diabetes Neg Hx     Review of Systems  Constitutional: Negative for chills and fever.  Musculoskeletal: Positive for arthralgias and gait problem. Negative for back pain.  Neurological: Positive for weakness (Left leg feels weak) and numbness (Left groin).       Objective:   Vitals:   10/25/17 1038  BP: (!) 152/82  Pulse: 87  Resp: 16  Temp: 97.9 F (36.6 C)  SpO2: 98%   Wt Readings from Last 3 Encounters:  10/25/17 154 lb (69.9 kg)  10/10/17 155 lb (70.3 kg)  10/03/17 152 lb (68.9 kg)   Body mass index is 25.24 kg/m.   Physical Exam  Constitutional: She appears well-developed. No distress.  HENT:  Head: Normocephalic and atraumatic.  Musculoskeletal: She exhibits no edema.  Tenderness on lateral aspect of left hip, difficulty lifting left leg, slight limp  Neurological:  Normal sensation bilateral lower extremities  Skin: Skin is warm and dry. She is not diaphoretic.           Assessment & Plan:    See Problem List for Assessment and Plan of chronic medical problems.

## 2017-10-25 ENCOUNTER — Ambulatory Visit (INDEPENDENT_AMBULATORY_CARE_PROVIDER_SITE_OTHER)
Admission: RE | Admit: 2017-10-25 | Discharge: 2017-10-25 | Disposition: A | Discharge status: Home / Self Care | Payer: Medicare Other | Source: Ambulatory Visit | Attending: Internal Medicine | Admitting: Internal Medicine

## 2017-10-25 ENCOUNTER — Ambulatory Visit (INDEPENDENT_AMBULATORY_CARE_PROVIDER_SITE_OTHER): Payer: Medicare Other | Admitting: Internal Medicine

## 2017-10-25 ENCOUNTER — Encounter: Payer: Self-pay | Admitting: Internal Medicine

## 2017-10-25 VITALS — BP 152/82 | HR 87 | Temp 97.9°F | Resp 16 | Wt 154.0 lb

## 2017-10-25 DIAGNOSIS — M160 Bilateral primary osteoarthritis of hip: Secondary | ICD-10-CM | POA: Diagnosis not present

## 2017-10-25 DIAGNOSIS — M25552 Pain in left hip: Secondary | ICD-10-CM

## 2017-10-25 MED ORDER — TRAMADOL HCL 50 MG PO TABS: 50.0000 mg | ORAL_TABLET | Freq: Three times a day (TID) | ORAL | 0 refills | Status: DC | PRN

## 2017-10-25 NOTE — Assessment & Plan Note (Addendum)
Left hip/groin pain that started several months ago after a fall, but has gotten worse recently Has seen Dr. Tamala Julian and has an appointment coming up next week Has been taking ibuprofen and Aleve, which has helped, but she only has one kidney and should not be taking these medications Will obtain an x-ray since her pain is worse Will start tramadol for her pain, continue Tylenol-take together - discussed possible side effects of tramadol We will follow-up next week with Dr. Tamala Julian

## 2017-10-25 NOTE — Patient Instructions (Signed)
Have a xray of your hip today.  We will call you with the results and send them to Dr Tamala Julian.    Take the tramadol as needed for the pain.  Continue to take the tylenol.    Do not take any more motrin or aleve.

## 2017-10-30 NOTE — Progress Notes (Signed)
[name] [address], [place] [address] Phone: [phone] Subjective:    I'm seeing this patient by the request  of:    CC: Left hip, right knee follow-up  YIR:[id number]  [name] is a 72 y.o. female coming in with complaint of right knee and left hip pain. She had an injection in her hip and htat lasted only one week. Her hip is worse than last visit. She saw Dr. Quay Burow who performed an x-ray and prescribed pain mediation. Patient is in constant pain throughout the day but has some relief when she lays.   She said that her right knee is better.   Patient did have x-rays.  X-rays independently visualized by me.  Left hip does have mild osteoarthritic changes.    Past Medical History:  Diagnosis Date  . Adenomatous colon polyp   . Arthritis   . Cataract   . Heart murmur    MVP  . Hyperlipidemia   . Hypertension   . Thyroid disease    Past Surgical History:  Procedure Laterality Date  . BREAST EXCISIONAL BIOPSY Right 1983  . COLONOSCOPY W/ POLYPECTOMY  2003,2006,2013   [name]  . DILATION AND CURETTAGE OF UTERUS     X2  . NEPHRECTOMY  2010   Left for 9.8cm benign complex cyst  . THYROIDECTOMY  2000   [name]  . TONSILLECTOMY    . TOTAL ABDOMINAL HYSTERECTOMY W/ BILATERAL SALPINGOOPHORECTOMY  2003   [name] for dysfunctional menses   Social History   Socioeconomic History  . Marital status: Married    Spouse name: None  . Number of children: None  . Years of education: None  . Highest education level: None  Social Needs  . Financial resource strain: None  . Food insecurity - worry: None  . Food insecurity - inability: None  . Transportation needs - medical: None  . Transportation needs - non-medical: None  Occupational History  . Occupation: Sales  Tobacco Use  . Smoking status: Former Smoker    Packs/day: 0.50    Years: 4.00    Pack years: 2.00    Last attempt to quit: 08/29/1975    Years since quitting:  42.2  . Smokeless tobacco: Never Used  . Tobacco comment: smoked 1973-1977, up to 1/2 ppd  Substance and Sexual Activity  . Alcohol use: Yes    Alcohol/week: 0.0 oz    Comment: minimally  1 every 6 months  . Drug use: No  . Sexual activity: Yes    Comment: HYST  Other Topics Concern  . None  Social History Narrative   Fun/Hobbies: Grandkids   Allergies  Allergen Reactions  . Codeine Sulfate     "made me jumpy"  . Erythromycin     nausea   Family History  Problem Relation Age of Onset  . Transient ischemic attack Father        in 10s  . Coronary artery disease Father        stents @ 8  . Nephrolithiasis Father   . Hyperlipidemia Mother   . Transient ischemic attack Mother 28  . Thyroid disease Mother        hyperthyroid  . Urolithiasis Sister   . Diabetes Neg Hx      Past medical history, social, surgical and family history all reviewed in electronic medical record.  No pertanent information unless stated regarding to the chief complaint.   Review of Systems:Review of systems updated  and as accurate as of 10/31/17  No headache, visual changes, nausea, vomiting, diarrhea, constipation, dizziness, abdominal pain, skin rash, fevers, chills, night sweats, weight loss, swollen lymph nodes, body aches, joint swelling,chest pain, shortness of breath, mood changes.  Positive muscle aches  Objective  Blood pressure 126/76, pulse 72, height 5' 5.5" (1.664 m), weight 154 lb (69.9 kg), SpO2 94 %. Systems examined below as of 10/31/17   General: No apparent distress alert and oriented x3 mood and affect normal, dressed appropriately.  HEENT: Pupils equal, extraocular movements intact  Respiratory: Patient's speak in full sentences and does not appear short of breath  Cardiovascular: No lower extremity edema, non tender, no erythema  Skin: Warm dry intact with no signs of infection or rash on extremities or on axial skeleton.  Abdomen: Soft nontender  Neuro: Cranial nerves II  through XII are intact, neurovascularly intact in all extremities with 2+ DTRs and 2+ pulses.  Lymph: No lymphadenopathy of posterior or anterior cervical chain or axillae bilaterally.  Gait antalgic gait MSK:  Non tender with full range of motion and good stability and symmetric strength and tone of shoulders, elbows, wrist,  knee and ankles bilaterally.  Left hip shows the patient still has severe tenderness over the lateral aspect of the hip.  Patient has decreasing range of motion with only 4 degrees of internal rotation, external rotation seems to be more severe with pain on the lateral and posterior aspect of the hip.  Tightness of the hamstring with straight leg test but negative radicular symptoms.  Mild weakness with adduction of the hip.  Neurovascularly intact distally.    Impression and Recommendations:     This case required medical decision making of moderate complexity.      Note: This dictation was prepared with Dragon dictation along with smaller phrase technology. Any transcriptional errors that result from this process are unintentional.

## 2017-10-31 ENCOUNTER — Ambulatory Visit: Payer: Medicare Other | Admitting: Family Medicine

## 2017-10-31 ENCOUNTER — Encounter: Payer: Self-pay | Admitting: Family Medicine

## 2017-10-31 ENCOUNTER — Ambulatory Visit (INDEPENDENT_AMBULATORY_CARE_PROVIDER_SITE_OTHER)
Admission: RE | Admit: 2017-10-31 | Discharge: 2017-10-31 | Disposition: A | Discharge status: Home / Self Care | Payer: Medicare Other | Source: Ambulatory Visit | Attending: Family Medicine | Admitting: Family Medicine

## 2017-10-31 VITALS — BP 126/76 | HR 72 | Ht 65.5 in | Wt 154.0 lb

## 2017-10-31 DIAGNOSIS — M25552 Pain in left hip: Secondary | ICD-10-CM

## 2017-10-31 DIAGNOSIS — M545 Low back pain, unspecified: Secondary | ICD-10-CM

## 2017-10-31 DIAGNOSIS — M5136 Other intervertebral disc degeneration, lumbar region: Secondary | ICD-10-CM | POA: Diagnosis not present

## 2017-10-31 NOTE — Assessment & Plan Note (Signed)
Patient has significant weakness noted as well as decreasing range of motion.  Concern for possible internal derangement of the hip.  Patient also symptoms seem to be more on the lateral aspect of the hip.  Will be sent to formal physical therapy for greater trochanteric bursitis and increasing range of motion.  Worsening symptoms MRI could be beneficial to rule out avascular necrosis.  We discussed with patient about lumbar radiculopathy being within the differential and x-rays ordered today.  Patient declined other medications.  Patient knows if worsening symptoms advanced imaging would be warranted.  Follow-up again in 4 weeks

## 2017-10-31 NOTE — Patient Instructions (Signed)
Good to see yo u I am sorry you are not better We will get an xray of your back  We will get you in with PT and they will call you  Ice 20 minutes 2 times daily. Usually after activity and before bed. pennsaid pinkie amount topically 2 times daily as needed.  Stay active See me again in 4 weeks and if not great we will consider a MRI

## 2017-11-01 ENCOUNTER — Telehealth: Payer: Self-pay | Admitting: Nurse Practitioner

## 2017-11-01 ENCOUNTER — Encounter: Payer: Self-pay | Admitting: Family Medicine

## 2017-11-01 MED ORDER — MELOXICAM 7.5 MG PO TABS: 7.5000 mg | ORAL_TABLET | Freq: Every day | ORAL | 0 refills | Status: DC

## 2017-11-01 NOTE — Telephone Encounter (Signed)
I think you can use your judgement.  She is in pain. I do belive quite a bit.  She seems to be using it quite a bit. Could tell her to schedule tylenol and see how it does.

## 2017-11-01 NOTE — Telephone Encounter (Signed)
Hey Dr. Tamala Julian,  I see are you helping [name] with her pain, and she wrote me today for a tramadol refill. Based off your encounter with her on 3/6, should I refill this ?  Thanks!

## 2017-11-01 NOTE — Telephone Encounter (Signed)
Check [place] registry last filled 10/25/2017.Marland KitchenJohny Chess

## 2017-11-01 NOTE — Telephone Encounter (Signed)
Rx   Request  Ultram Refill    Fill   Date   10/25/2017   30  Tabs   1 tab q  8 hrs  PRN   - Office  Encounter  With Billey Gosling  MD   Last  Visit  10/31/2017   With [place]

## 2017-11-01 NOTE — Telephone Encounter (Signed)
Copied from [place] [id number]. Topic: Quick Communication - Rx Refill/Question >> Nov 01, 2017  9:19 AM Yvette Rack wrote: Medication: traMADol (ULTRAM) 50 MG tablet   Has the patient contacted their pharmacy? Yes.     (Agent: If no, request that the patient contact the pharmacy for the refill.)   Preferred Pharmacy (with phone number or street name):     [id number] [address], [place] [phone] (Phone) [phone] (Fax)     Agent: Please be advised that RX refills may take up to 3 business days. We ask that you follow-up with your pharmacy.

## 2017-11-02 ENCOUNTER — Other Ambulatory Visit: Payer: Self-pay | Admitting: Internal Medicine

## 2017-11-02 ENCOUNTER — Encounter: Payer: Self-pay | Admitting: Family Medicine

## 2017-11-02 MED ORDER — TRAMADOL HCL 50 MG PO TABS: 50.0000 mg | ORAL_TABLET | Freq: Three times a day (TID) | ORAL | 0 refills | Status: DC | PRN

## 2017-11-02 NOTE — Telephone Encounter (Signed)
Sent 1 refill

## 2017-11-04 DIAGNOSIS — S335XXA Sprain of ligaments of lumbar spine, initial encounter: Secondary | ICD-10-CM | POA: Diagnosis not present

## 2017-11-04 DIAGNOSIS — S7002XA Contusion of left hip, initial encounter: Secondary | ICD-10-CM | POA: Diagnosis not present

## 2017-11-12 ENCOUNTER — Other Ambulatory Visit: Payer: Self-pay | Admitting: Nurse Practitioner

## 2017-11-12 DIAGNOSIS — F419 Anxiety disorder, unspecified: Principal | ICD-10-CM

## 2017-11-12 DIAGNOSIS — F329 Major depressive disorder, single episode, unspecified: Secondary | ICD-10-CM

## 2017-11-12 DIAGNOSIS — F32A Depression, unspecified: Secondary | ICD-10-CM

## 2017-11-13 NOTE — Telephone Encounter (Signed)
Last refill was 10/19/17 per database. Please advise.

## 2017-11-14 DIAGNOSIS — N200 Calculus of kidney: Secondary | ICD-10-CM | POA: Diagnosis not present

## 2017-11-21 DIAGNOSIS — N200 Calculus of kidney: Secondary | ICD-10-CM | POA: Diagnosis not present

## 2017-11-22 DIAGNOSIS — S335XXD Sprain of ligaments of lumbar spine, subsequent encounter: Secondary | ICD-10-CM | POA: Diagnosis not present

## 2017-11-28 ENCOUNTER — Ambulatory Visit: Payer: Medicare Other | Admitting: Family Medicine

## 2017-11-29 ENCOUNTER — Other Ambulatory Visit: Payer: Self-pay | Admitting: Family Medicine

## 2017-11-29 DIAGNOSIS — M25552 Pain in left hip: Secondary | ICD-10-CM | POA: Diagnosis not present

## 2017-12-19 ENCOUNTER — Encounter: Payer: Self-pay | Admitting: Family Medicine

## 2018-01-05 ENCOUNTER — Other Ambulatory Visit: Payer: Self-pay | Admitting: Nurse Practitioner

## 2018-01-05 DIAGNOSIS — I10 Essential (primary) hypertension: Secondary | ICD-10-CM

## 2018-01-13 ENCOUNTER — Other Ambulatory Visit: Payer: Self-pay | Admitting: Nurse Practitioner

## 2018-01-13 DIAGNOSIS — F419 Anxiety disorder, unspecified: Principal | ICD-10-CM

## 2018-01-13 DIAGNOSIS — F329 Major depressive disorder, single episode, unspecified: Secondary | ICD-10-CM

## 2018-01-14 NOTE — Telephone Encounter (Signed)
Last refill was 12/18/17 per database.

## 2018-01-30 ENCOUNTER — Other Ambulatory Visit (INDEPENDENT_AMBULATORY_CARE_PROVIDER_SITE_OTHER): Payer: Medicare Other

## 2018-01-30 ENCOUNTER — Other Ambulatory Visit: Payer: Self-pay

## 2018-01-30 DIAGNOSIS — I10 Essential (primary) hypertension: Secondary | ICD-10-CM

## 2018-01-30 DIAGNOSIS — E782 Mixed hyperlipidemia: Secondary | ICD-10-CM

## 2018-01-30 DIAGNOSIS — E890 Postprocedural hypothyroidism: Secondary | ICD-10-CM

## 2018-01-30 LAB — LDL CHOLESTEROL, DIRECT: Direct LDL: 202 mg/dL

## 2018-01-30 LAB — COMPREHENSIVE METABOLIC PANEL
ALBUMIN: 4.2 g/dL (ref 3.5–5.2)
ALK PHOS: 58 U/L (ref 39–117)
ALT: 15 U/L (ref 0–35)
AST: 18 U/L (ref 0–37)
BUN: 19 mg/dL (ref 6–23)
CHLORIDE: 106 meq/L (ref 96–112)
CO2: 28 mEq/L (ref 19–32)
Calcium: 9.5 mg/dL (ref 8.4–10.5)
Creatinine, Ser: 1.05 mg/dL (ref 0.40–1.20)
GFR: 54.75 mL/min — AB (ref >60.00)
Glucose, Bld: 90 mg/dL (ref 70–99)
POTASSIUM: 4.4 meq/L (ref 3.5–5.1)
SODIUM: 141 meq/L (ref 135–145)
TOTAL PROTEIN: 7.1 g/dL (ref 6.0–8.3)
Total Bilirubin: 0.5 mg/dL (ref 0.2–1.2)

## 2018-01-30 LAB — LIPID PANEL
Cholesterol: 313 mg/dL — ABNORMAL HIGH (ref 0–200)
HDL: 40.4 mg/dL (ref >39.00)
NonHDL: 272.54
Total CHOL/HDL Ratio: 8
Triglycerides: 302 mg/dL — ABNORMAL HIGH (ref 0.0–149.0)
VLDL: 60.4 mg/dL — AB (ref 0.0–40.0)

## 2018-01-30 LAB — TSH: TSH: 3.31 u[IU]/mL (ref 0.35–4.50)

## 2018-01-31 ENCOUNTER — Telehealth: Payer: Self-pay | Admitting: Nurse Practitioner

## 2018-01-31 DIAGNOSIS — I10 Essential (primary) hypertension: Secondary | ICD-10-CM

## 2018-02-01 NOTE — Telephone Encounter (Signed)
Appointment scheduled on 02/15/18.

## 2018-02-01 NOTE — Telephone Encounter (Signed)
Pt is due for a follow up appt. Can you call please and have them schedule.

## 2018-02-04 ENCOUNTER — Other Ambulatory Visit: Payer: Self-pay | Admitting: Nurse Practitioner

## 2018-02-04 DIAGNOSIS — E782 Mixed hyperlipidemia: Secondary | ICD-10-CM

## 2018-02-04 MED ORDER — ROSUVASTATIN CALCIUM 10 MG PO TABS: 10.0000 mg | ORAL_TABLET | Freq: Every day | ORAL | 1 refills | Status: DC

## 2018-02-15 ENCOUNTER — Ambulatory Visit (INDEPENDENT_AMBULATORY_CARE_PROVIDER_SITE_OTHER): Payer: Medicare Other | Admitting: Nurse Practitioner

## 2018-02-15 ENCOUNTER — Ambulatory Visit (INDEPENDENT_AMBULATORY_CARE_PROVIDER_SITE_OTHER): Payer: Medicare Other | Admitting: *Deleted

## 2018-02-15 ENCOUNTER — Encounter: Payer: Self-pay | Admitting: Nurse Practitioner

## 2018-02-15 VITALS — BP 140/78 | HR 73 | Resp 18 | Ht 66.0 in | Wt 151.0 lb

## 2018-02-15 VITALS — BP 140/78 | HR 73 | Temp 98.4°F | Resp 16 | Ht 65.5 in | Wt 151.0 lb

## 2018-02-15 DIAGNOSIS — E785 Hyperlipidemia, unspecified: Secondary | ICD-10-CM | POA: Diagnosis not present

## 2018-02-15 DIAGNOSIS — Z0000 Encounter for general adult medical examination without abnormal findings: Secondary | ICD-10-CM

## 2018-02-15 DIAGNOSIS — Z13820 Encounter for screening for osteoporosis: Secondary | ICD-10-CM | POA: Diagnosis not present

## 2018-02-15 DIAGNOSIS — F419 Anxiety disorder, unspecified: Secondary | ICD-10-CM

## 2018-02-15 DIAGNOSIS — I10 Essential (primary) hypertension: Secondary | ICD-10-CM | POA: Diagnosis not present

## 2018-02-15 DIAGNOSIS — F329 Major depressive disorder, single episode, unspecified: Secondary | ICD-10-CM

## 2018-02-15 DIAGNOSIS — E890 Postprocedural hypothyroidism: Secondary | ICD-10-CM

## 2018-02-15 DIAGNOSIS — E782 Mixed hyperlipidemia: Secondary | ICD-10-CM

## 2018-02-15 NOTE — Patient Instructions (Addendum)
Continue doing brain stimulating activities (puzzles, reading, adult coloring books, staying active) to keep memory sharp.   Continue to eat heart healthy diet (full of fruits, vegetables, whole grains, lean protein, water--limit salt, fat, and sugar intake) and increase physical activity as tolerated.   [name] , Thank you for taking time to come for your Medicare Wellness Visit. I appreciate your ongoing commitment to your health goals. Please review the following plan we discussed and let me know if I can assist you in the future.   These are the goals we discussed: Goals      Patient Stated   . patient (pt-stated)     To take the time to think about future and make plans      Other   . Patient Stated     I want to get a stationary bike to ride and begin to exercise. Enjoy life and family. Maybe check into zumba classes.       This is a list of the screening recommended for you and due dates:  Health Maintenance  Topic Date Due  . Colon Cancer Screening  01/03/2017  . Flu Shot  03/28/2018  . Mammogram  12/28/2018  . Tetanus Vaccine  02/06/2023  . DEXA scan (bone density measurement)  Completed  .  Hepatitis C: One time screening is recommended by Center for Disease Control  (CDC) for  adults born from [birth year] through 1965.   Completed  . Pneumonia vaccines  Completed     Fat and Cholesterol Restricted Diet Getting too much fat and cholesterol in your diet may cause health problems. Following this diet helps keep your fat and cholesterol at normal levels. This can keep you from getting sick. What types of fat should I choose?  Choose monosaturated and polyunsaturated fats. These are found in foods such as olive oil, canola oil, flaxseeds, walnuts, almonds, and seeds.  Eat more omega-3 fats. Good choices include salmon, mackerel, sardines, tuna, flaxseed oil, and ground flaxseeds.  Limit saturated fats. These are in animal products such as meats, butter, and cream. They can  also be in plant products such as palm oil, palm kernel oil, and coconut oil.  Avoid foods with partially hydrogenated oils in them. These contain trans fats. Examples of foods that have trans fats are stick margarine, some tub margarines, cookies, crackers, and other baked goods. What general guidelines do I need to follow?  Check food labels. Look for the words "trans fat" and "saturated fat."  When preparing a meal: ? Fill half of your plate with vegetables and green salads. ? Fill one fourth of your plate with whole grains. Look for the word "whole" as the first word in the ingredient list. ? Fill one fourth of your plate with lean protein foods.  Eat more foods that have fiber, like apples, carrots, beans, peas, and barley.  Eat more home-cooked foods. Eat less at restaurants and buffets.  Limit or avoid alcohol.  Limit foods high in starch and sugar.  Limit fried foods.  Cook foods without frying them. Baking, boiling, grilling, and broiling are all great options.  Lose weight if you are overweight. Losing even a small amount of weight can help your overall health. It can also help prevent diseases such as diabetes and heart disease. What foods can I eat? Grains Whole grains, such as whole wheat or whole grain breads, crackers, cereals, and pasta. Unsweetened oatmeal, bulgur, barley, quinoa, or brown rice. Corn or whole wheat flour tortillas.  Vegetables Fresh or frozen vegetables (raw, steamed, roasted, or grilled). Green salads. Fruits All fresh, canned (in natural juice), or frozen fruits. Meat and Other Protein Products Ground beef (85% or leaner), grass-fed beef, or beef trimmed of fat. Skinless chicken or [place]. Ground chicken or [place]. Pork trimmed of fat. All fish and seafood. Eggs. Dried beans, peas, or lentils. Unsalted nuts or seeds. Unsalted canned or dry beans. Dairy Low-fat dairy products, such as skim or 1% milk, 2% or reduced-fat cheeses, low-fat ricotta or  cottage cheese, or plain low-fat yogurt. Fats and Oils Tub margarines without trans fats. Light or reduced-fat mayonnaise and salad dressings. Avocado. Olive, canola, sesame, or safflower oils. Natural peanut or almond butter (choose ones without added sugar and oil). The items listed above may not be a complete list of recommended foods or beverages. Contact your dietitian for more options. What foods are not recommended? Grains White bread. White pasta. White rice. Cornbread. Bagels, pastries, and croissants. Crackers that contain trans fat. Vegetables White potatoes. Corn. Creamed or fried vegetables. Vegetables in a cheese sauce. Fruits Dried fruits. Canned fruit in light or heavy syrup. Fruit juice. Meat and Other Protein Products Fatty cuts of meat. Ribs, chicken wings, bacon, sausage, bologna, salami, chitterlings, fatback, hot dogs, bratwurst, and packaged luncheon meats. Liver and organ meats. Dairy Whole or 2% milk, cream, half-and-half, and cream cheese. Whole milk cheeses. Whole-fat or sweetened yogurt. Full-fat cheeses. Nondairy creamers and whipped toppings. Processed cheese, cheese spreads, or cheese curds. Sweets and Desserts Corn syrup, sugars, honey, and molasses. Candy. Jam and jelly. Syrup. Sweetened cereals. Cookies, pies, cakes, donuts, muffins, and ice cream. Fats and Oils Butter, stick margarine, lard, shortening, ghee, or bacon fat. Coconut, palm kernel, or palm oils. Beverages Alcohol. Sweetened drinks (such as sodas, lemonade, and fruit drinks or punches). The items listed above may not be a complete list of foods and beverages to avoid. Contact your dietitian for more information. This information is not intended to replace advice given to you by your health care provider. Make sure you discuss any questions you have with your health care provider. Document Released: 02/13/2012 Document Revised: 04/20/2016 Document Reviewed: 11/13/2013 Elsevier Interactive Patient  Education  [employer].

## 2018-02-15 NOTE — Patient Instructions (Signed)
Please return for lab work around the end of July.  Please schedule bone scan with our front desk.  Please call GI to schedule your colonoscopy.  Please try to check your blood pressure once daily or at least a few times a week, at the same time each day, and keep a log. Please let me know if your blood pressure readings are greater than 140/90  Otherwise I will plan to see you back in 6 months for routine follow up  Health Maintenance, Female Adopting a healthy lifestyle and getting preventive care can go a long way to promote health and wellness. Talk with your health care provider about what schedule of regular examinations is right for you. This is a good chance for you to check in with your provider about disease prevention and staying healthy. In between checkups, there are plenty of things you can do on your own. Experts have done a lot of research about which lifestyle changes and preventive measures are most likely to keep you healthy. Ask your health care provider for more information. Weight and diet Eat a healthy diet  Be sure to include plenty of vegetables, fruits, low-fat dairy products, and lean protein.  Do not eat a lot of foods high in solid fats, added sugars, or salt.  Get regular exercise. This is one of the most important things you can do for your health. ? Most adults should exercise for at least 150 minutes each week. The exercise should increase your heart rate and make you sweat (moderate-intensity exercise). ? Most adults should also do strengthening exercises at least twice a week. This is in addition to the moderate-intensity exercise.  Maintain a healthy weight  Body mass index (BMI) is a measurement that can be used to identify possible weight problems. It estimates body fat based on height and weight. Your health care provider can help determine your BMI and help you achieve or maintain a healthy weight.  For females 20 years of age and older: ? A BMI  below 18.5 is considered underweight. ? A BMI of 18.5 to 24.9 is normal. ? A BMI of 25 to 29.9 is considered overweight. ? A BMI of 30 and above is considered obese.  Watch levels of cholesterol and blood lipids  You should start having your blood tested for lipids and cholesterol at 72 years of age, then have this test every 5 years.  You may need to have your cholesterol levels checked more often if: ? Your lipid or cholesterol levels are high. ? You are older than 72 years of age. ? You are at high risk for heart disease.  Cancer screening Lung Cancer  Lung cancer screening is recommended for adults 55-80 years old who are at high risk for lung cancer because of a history of smoking.  A yearly low-dose CT scan of the lungs is recommended for people who: ? Currently smoke. ? Have quit within the past 15 years. ? Have at least a 30-pack-year history of smoking. A pack year is smoking an average of one pack of cigarettes a day for 1 year.  Yearly screening should continue until it has been 15 years since you quit.  Yearly screening should stop if you develop a health problem that would prevent you from having lung cancer treatment.  Breast Cancer  Practice breast self-awareness. This means understanding how your breasts normally appear and feel.  It also means doing regular breast self-exams. Let your health care provider know about   any changes, no matter how small.  If you are in your 20s or 30s, you should have a clinical breast exam (CBE) by a health care provider every 1-3 years as part of a regular health exam.  If you are 56 or older, have a CBE every year. Also consider having a breast X-ray (mammogram) every year.  If you have a family history of breast cancer, talk to your health care provider about genetic screening.  If you are at high risk for breast cancer, talk to your health care provider about having an MRI and a mammogram every year.  Breast cancer gene  (BRCA) assessment is recommended for women who have family members with BRCA-related cancers. BRCA-related cancers include: ? Breast. ? Ovarian. ? Tubal. ? Peritoneal cancers.  Results of the assessment will determine the need for genetic counseling and BRCA1 and BRCA2 testing.  Cervical Cancer Your health care provider may recommend that you be screened regularly for cancer of the pelvic organs (ovaries, uterus, and vagina). This screening involves a pelvic examination, including checking for microscopic changes to the surface of your cervix (Pap test). You may be encouraged to have this screening done every 3 years, beginning at age 72.  For women ages 78-65, health care providers may recommend pelvic exams and Pap testing every 3 years, or they may recommend the Pap and pelvic exam, combined with testing for human papilloma virus (HPV), every 5 years. Some types of HPV increase your risk of cervical cancer. Testing for HPV may also be done on women of any age with unclear Pap test results.  Other health care providers may not recommend any screening for nonpregnant women who are considered low risk for pelvic cancer and who do not have symptoms. Ask your health care provider if a screening pelvic exam is right for you.  If you have had past treatment for cervical cancer or a condition that could lead to cancer, you need Pap tests and screening for cancer for at least 20 years after your treatment. If Pap tests have been discontinued, your risk factors (such as having a new sexual partner) need to be reassessed to determine if screening should resume. Some women have medical problems that increase the chance of getting cervical cancer. In these cases, your health care provider may recommend more frequent screening and Pap tests.  Colorectal Cancer  This type of cancer can be detected and often prevented.  Routine colorectal cancer screening usually begins at 72 years of age and continues  through 72 years of age.  Your health care provider may recommend screening at an earlier age if you have risk factors for colon cancer.  Your health care provider may also recommend using home test kits to check for hidden blood in the stool.  A small camera at the end of a tube can be used to examine your colon directly (sigmoidoscopy or colonoscopy). This is done to check for the earliest forms of colorectal cancer.  Routine screening usually begins at age 62.  Direct examination of the colon should be repeated every 5-10 years through 72 years of age. However, you may need to be screened more often if early forms of precancerous polyps or small growths are found.  Skin Cancer  Check your skin from head to toe regularly.  Tell your health care provider about any new moles or changes in moles, especially if there is a change in a mole's shape or color.  Also tell your health care provider  if you have a mole that is larger than the size of a pencil eraser.  Always use sunscreen. Apply sunscreen liberally and repeatedly throughout the day.  Protect yourself by wearing long sleeves, pants, a wide-brimmed hat, and sunglasses whenever you are outside.  Heart disease, diabetes, and high blood pressure  High blood pressure causes heart disease and increases the risk of stroke. High blood pressure is more likely to develop in: ? People who have blood pressure in the high end of the normal range (130-139/85-89 mm Hg). ? People who are overweight or obese. ? People who are African American.  If you are 73-24 years of age, have your blood pressure checked every 3-5 years. If you are 8 years of age or older, have your blood pressure checked every year. You should have your blood pressure measured twice-once when you are at a hospital or clinic, and once when you are not at a hospital or clinic. Record the average of the two measurements. To check your blood pressure when you are not at a  hospital or clinic, you can use: ? An automated blood pressure machine at a pharmacy. ? A home blood pressure monitor.  If you are between 62 years and 66 years old, ask your health care provider if you should take aspirin to prevent strokes.  Have regular diabetes screenings. This involves taking a blood sample to check your fasting blood sugar level. ? If you are at a normal weight and have a low risk for diabetes, have this test once every three years after 72 years of age. ? If you are overweight and have a high risk for diabetes, consider being tested at a younger age or more often. Preventing infection Hepatitis B  If you have a higher risk for hepatitis B, you should be screened for this virus. You are considered at high risk for hepatitis B if: ? You were born in a country where hepatitis B is common. Ask your health care provider which countries are considered high risk. ? Your parents were born in a high-risk country, and you have not been immunized against hepatitis B (hepatitis B vaccine). ? You have HIV or AIDS. ? You use needles to inject street drugs. ? You live with someone who has hepatitis B. ? You have had sex with someone who has hepatitis B. ? You get hemodialysis treatment. ? You take certain medicines for conditions, including cancer, organ transplantation, and autoimmune conditions.  Hepatitis C  Blood testing is recommended for: ? Everyone born from [birth year] through 1965. ? Anyone with known risk factors for hepatitis C.  Sexually transmitted infections (STIs)  You should be screened for sexually transmitted infections (STIs) including gonorrhea and chlamydia if: ? You are sexually active and are younger than 72 years of age. ? You are older than 72 years of age and your health care provider tells you that you are at risk for this type of infection. ? Your sexual activity has changed since you were last screened and you are at an increased risk for chlamydia or  gonorrhea. Ask your health care provider if you are at risk.  If you do not have HIV, but are at risk, it may be recommended that you take a prescription medicine daily to prevent HIV infection. This is called pre-exposure prophylaxis (PrEP). You are considered at risk if: ? You are sexually active and do not regularly use condoms or know the HIV status of your partner(s). ? You take drugs  by injection. ? You are sexually active with a partner who has HIV.  Talk with your health care provider about whether you are at high risk of being infected with HIV. If you choose to begin PrEP, you should first be tested for HIV. You should then be tested every 3 months for as long as you are taking PrEP. Pregnancy  If you are premenopausal and you may become pregnant, ask your health care provider about preconception counseling.  If you may become pregnant, take 400 to 800 micrograms (mcg) of folic acid every day.  If you want to prevent pregnancy, talk to your health care provider about birth control (contraception). Osteoporosis and menopause  Osteoporosis is a disease in which the bones lose minerals and strength with aging. This can result in serious bone fractures. Your risk for osteoporosis can be identified using a bone density scan.  If you are 18 years of age or older, or if you are at risk for osteoporosis and fractures, ask your health care provider if you should be screened.  Ask your health care provider whether you should take a calcium or vitamin D supplement to lower your risk for osteoporosis.  Menopause may have certain physical symptoms and risks.  Hormone replacement therapy may reduce some of these symptoms and risks. Talk to your health care provider about whether hormone replacement therapy is right for you. Follow these instructions at home:  Schedule regular health, dental, and eye exams.  Stay current with your immunizations.  Do not use any tobacco products including  cigarettes, chewing tobacco, or electronic cigarettes.  If you are pregnant, do not drink alcohol.  If you are breastfeeding, limit how much and how often you drink alcohol.  Limit alcohol intake to no more than 1 drink per day for nonpregnant women. One drink equals 12 ounces of beer, 5 ounces of wine, or 1 ounces of hard liquor.  Do not use street drugs.  Do not share needles.  Ask your health care provider for help if you need support or information about quitting drugs.  Tell your health care provider if you often feel depressed.  Tell your health care provider if you have ever been abused or do not feel safe at home. This information is not intended to replace advice given to you by your health care provider. Make sure you discuss any questions you have with your health care provider. Document Released: 02/27/2011 Document Revised: 01/20/2016 Document Reviewed: 05/18/2015 Elsevier Interactive Patient Education  [employer].

## 2018-02-15 NOTE — Progress Notes (Signed)
Subjective:   [name] is a 72 y.o. female who presents for Medicare Annual (Subsequent) preventive examination.  Review of Systems:  No ROS.  Medicare Wellness Visit. Additional risk factors are reflected in the social history.  Cardiac Risk Factors include: hypertension Sleep patterns: gets up 1 times nightly to void and sleeps 7 hours nightly.    Home Safety/Smoke Alarms: Feels safe in home. Smoke alarms in place.  Living environment; residence and Firearm Safety: split level / walkout, no firearms, Lives with family, no needs for DME, good support system. Seat Belt Safety/Bike Helmet: Wears seat belt.     Objective:     Vitals: BP 140/78   Pulse 73   Resp 18   Ht 5\' 6"  (1.676 m)   Wt 151 lb (68.5 kg)   SpO2 98%   BMI 24.37 kg/m   Body mass index is 24.37 kg/m.  Advanced Directives 02/15/2018 11/17/2015  Does Patient Have a Medical Advance Directive? No Yes  Copy of [place] in Chart? - Yes  Would patient like information on creating a medical advance directive? Yes (ED - Information included in AVS) -    Tobacco Social History   Tobacco Use  Smoking Status Former Smoker  . Packs/day: 0.50  . Years: 4.00  . Pack years: 2.00  . Last attempt to quit: 08/29/1975  . Years since quitting: 42.4  Smokeless Tobacco Never Used  Tobacco Comment   smoked 1973-1977, up to 1/2 ppd     Counseling given: Not Answered Comment: smoked 1973-1977, up to 1/2 ppd  Past Medical History:  Diagnosis Date  . Adenomatous colon polyp   . Arthritis   . Cataract   . Heart murmur    MVP  . Hyperlipidemia   . Hypertension   . Thyroid disease    Past Surgical History:  Procedure Laterality Date  . BREAST EXCISIONAL BIOPSY Right 1983  . COLONOSCOPY W/ POLYPECTOMY  2003,2006,2013   [name]  . DILATION AND CURETTAGE OF UTERUS     X2  . NEPHRECTOMY  2010   Left for 9.8cm benign complex cyst  . THYROIDECTOMY  2000   [name]  . TONSILLECTOMY    .  TOTAL ABDOMINAL HYSTERECTOMY W/ BILATERAL SALPINGOOPHORECTOMY  2003   [name] for dysfunctional menses   Family History  Problem Relation Age of Onset  . Transient ischemic attack Father        in 71s  . Coronary artery disease Father        stents @ 99  . Nephrolithiasis Father   . Hyperlipidemia Mother   . Transient ischemic attack Mother 52  . Thyroid disease Mother        hyperthyroid  . Urolithiasis Sister   . Diabetes Neg Hx    Social History   Socioeconomic History  . Marital status: Married    Spouse name: Not on file  . Number of children: 2  . Years of education: Not on file  . Highest education level: Not on file  Occupational History  . Occupation: Press photographer  Social Needs  . Financial resource strain: Not hard at all  . Food insecurity:    Worry: Never true    Inability: Never true  . Transportation needs:    Medical: No    Non-medical: No  Tobacco Use  . Smoking status: Former Smoker    Packs/day: 0.50    Years: 4.00    Pack years: 2.00    Last attempt  to quit: 08/29/1975    Years since quitting: 42.4  . Smokeless tobacco: Never Used  . Tobacco comment: smoked 1973-1977, up to 1/2 ppd  Substance and Sexual Activity  . Alcohol use: Yes    Alcohol/week: 0.0 oz    Comment: minimally  1 every 6 months  . Drug use: No  . Sexual activity: Yes    Comment: HYST  Lifestyle  . Physical activity:    Days per week: 3 days    Minutes per session: 40 min  . Stress: To some extent  Relationships  . Social connections:    Talks on phone: More than three times a week    Gets together: More than three times a week    Attends religious service: 1 to 4 times per year    Active member of club or organization: Yes    Attends meetings of clubs or organizations: 1 to 4 times per year    Relationship status: Married  Other Topics Concern  . Not on file  Social History Narrative   Fun/Hobbies: Grandkids    Outpatient Encounter Medications as of 02/15/2018    Medication Sig  . acetaminophen (TYLENOL ARTHRITIS PAIN) 650 MG CR tablet Take 650 mg by mouth 4 (four) times daily as needed.   . ALPRAZolam (XANAX) 0.5 MG tablet TAKE 1 TABLET BY MOUTH AT BEDTIME AS NEEDED FOR ANXIETY OR SLEEP.  [name] amLODipine (NORVASC) 5 MG tablet Take 1 tablet (5 mg total) by mouth daily. Must keep appt w/new provider for future refills  . aspirin 81 MG tablet Take 81 mg by mouth daily.    . benazepril (LOTENSIN) 20 MG tablet TAKE 1 AND 1/2 TABLETS (30 MG TOTAL) BY MOUTH DAILY.  . Calcium Carbonate-Vitamin D (CALTRATE 600+D PO) Take by mouth daily.    . fenofibrate 160 MG tablet TAKE 1 TABLET BY MOUTH EVERY DAY  . levothyroxine (SYNTHROID, LEVOTHROID) 100 MCG tablet TAKE 1 TABLET BY MOUTH EVERY DAY EXECPT 1 AND 1/2 TABLETS ON WEDNESDAYS  . metoprolol succinate (TOPROL-XL) 50 MG 24 hr tablet TAKE 1 TABLET BY MOUTH EVERY DAY  . Misc Natural Products (TART CHERRY ADVANCED PO) Take by mouth.  . Multiple Vitamins-Minerals (CENTRUM PO) Take by mouth daily.    . potassium citrate (UROCIT-K 10) 10 MEQ (1080 MG) SR tablet Take 10 mEq by mouth daily.   . rosuvastatin (CRESTOR) 10 MG tablet Take 1 tablet (10 mg total) by mouth daily.  . traMADol (ULTRAM) 50 MG tablet Take 1 tablet (50 mg total) by mouth every 8 (eight) hours as needed.  . [DISCONTINUED] meloxicam (MOBIC) 7.5 MG tablet TAKE 1 TABLET BY MOUTH EVERY DAY  . [DISCONTINUED] simvastatin (ZOCOR) 20 MG tablet Take 20 mg by mouth at bedtime.     No facility-administered encounter medications on file as of 02/15/2018.     Activities of Daily Living In your present state of health, do you have any difficulty performing the following activities: 02/15/2018  Hearing? N  Vision? N  Difficulty concentrating or making decisions? N  Walking or climbing stairs? N  Dressing or bathing? N  Doing errands, shopping? N  Preparing Food and eating ? N  Using the Toilet? N  In the past six months, have you accidently leaked urine? N  Do  you have problems with loss of bowel control? N  Managing your Medications? N  Managing your Finances? N  Housekeeping or managing your Housekeeping? N  Some recent data might be hidden  Patient Care Team: Lance Sell, NP as PCP - General (Nurse Practitioner)    Assessment:   This is a routine wellness examination for [name]. Physical assessment deferred to PCP.   Exercise Activities and Dietary recommendations Current Exercise Habits: Home exercise routine, Type of exercise: walking, Time (Minutes): 35, Frequency (Times/Week): 4, Weekly Exercise (Minutes/Week): 140, Intensity: Mild, Exercise limited by: None identified  Diet (meal preparation, eat out, water intake, caffeinated beverages, dairy products, fruits and vegetables): in general, a "healthy" diet  , well balanced, eats a variety of fruits and vegetables daily, limits salt, fat/cholesterol, sugar,carbohydrates,caffeine, drinks 6-8 glasses of water daily.  Goals      Patient Stated   . patient (pt-stated)     To take the time to think about future and make plans      Other   . Patient Stated     I want to get a stationary bike to ride and begin to exercise. Enjoy life and family. Maybe check into zumba classes.       Fall Risk Fall Risk  02/15/2018 02/02/2017 01/05/2016 11/17/2015 10/16/2014  Falls in the past year? No No No No No   Depression Screen PHQ 2/9 Scores 02/15/2018 09/19/2017 08/15/2017 02/02/2017  PHQ - 2 Score 1 0 0 0  PHQ- 9 Score 1 1 0 -     Cognitive Function       Ad8 score reviewed for issues:  Issues making decisions: no  Less interest in hobbies / activities: no  Repeats questions, stories (family complaining): no  Trouble using ordinary gadgets (microwave, computer, phone):no  Forgets the month or year: no  Mismanaging finances: no  Remembering appts: no  Daily problems with thinking and/or memory: no Ad8 score is= 0  Immunization History  Administered Date(s)  Administered  . Influenza Split 06/21/2011, 06/12/2012  . Influenza Whole 07/11/2007, 06/10/2008, 06/29/2010  . Influenza, High Dose Seasonal PF 06/18/2013, 04/21/2016, 05/23/2017  . Influenza,inj,Quad PF,6+ Mos 06/10/2014, 05/07/2015  . Pneumococcal Conjugate-13 11/17/2015  . Pneumococcal Polysaccharide-23 09/17/2013  . Td 01/05/2003  . Tdap 02/05/2013  . Zoster 11/10/2011   Screening Tests Health Maintenance  Topic Date Due  . COLONOSCOPY  01/03/2017  . INFLUENZA VACCINE  03/28/2018  . MAMMOGRAM  12/28/2018  . TETANUS/TDAP  02/06/2023  . DEXA SCAN  Completed  . Hepatitis C Screening  Completed  . PNA vac Low Risk Adult  Completed      Plan:    Continue doing brain stimulating activities (puzzles, reading, adult coloring books, staying active) to keep memory sharp.   Continue to eat heart healthy diet (full of fruits, vegetables, whole grains, lean protein, water--limit salt, fat, and sugar intake) and increase physical activity as tolerated.  I have personally reviewed and noted the following in the patient's chart:   . Medical and social history . Use of alcohol, tobacco or illicit drugs  . Current medications and supplements . Functional ability and status . Nutritional status . Physical activity . Advanced directives . List of other physicians . Vitals . Screenings to include cognitive, depression, and falls . Referrals and appointments  In addition, I have reviewed and discussed with patient certain preventive protocols, quality metrics, and best practice recommendations. A written personalized care plan for preventive services as well as general preventive health recommendations were provided to patient.     Michiel Cowboy, RN  02/15/2018

## 2018-02-15 NOTE — Progress Notes (Signed)
Name: [name]   MRN: [id number]    DOB: 1945-10-12   Date:02/17/2018       Progress Note  Subjective  Chief Complaint  Chief Complaint  Patient presents with  . CPE    not fasting    HPI  Patient presents for annual CPE.  Diet, Exercise: active with grandchildren, no routine exercise; no routine diet- limits soda to 1/day  USPSTF grade A and B recommendations  Depression: maintained on xanax 0.5 qhs for anxiety- does feel xanax improves her sleep and helps her feel less worried Denies thoughts of hurting herself or others  Depression screen [facility] 2/9 02/15/2018 09/19/2017 08/15/2017 02/02/2017 01/05/2016  Decreased Interest 0 0 0 0 0  Down, Depressed, Hopeless 1 0 0 0 0  PHQ - 2 Score 1 0 0 0 0  Altered sleeping 0 0 0 - -  Tired, decreased energy 0 1 0 - -  Change in appetite 0 0 0 - -  Feeling bad or failure about yourself  0 0 0 - -  Trouble concentrating 0 0 0 - -  Moving slowly or fidgety/restless 0 0 0 - -  Suicidal thoughts 0 0 0 - -  PHQ-9 Score 1 1 0 - -  Difficult doing work/chores Not difficult at all - - - -    Hypertension -maintained on amlodipine 5, benazepril 30, metoprolol succinate 50 daily Reports daily medication compliance without adverse medication effects. Reports she routinely checks her blood pressure at home with readings  130s/80s  BP Readings from Last 3 Encounters:  02/15/18 140/78  02/15/18 140/78  10/31/17 126/76   Obesity: Wt Readings from Last 3 Encounters:  02/15/18 151 lb (68.5 kg)  02/15/18 151 lb (68.5 kg)  10/31/17 154 lb (69.9 kg)   BMI Readings from Last 3 Encounters:  02/15/18 24.37 kg/m  02/15/18 24.75 kg/m  10/31/17 25.24 kg/m    Alcohol: rarely Tobacco use: no, never  Hep C: screening done STD testing and prevention (chl/gon/syphilis): no concerns, declines  Intimate partner violence: denies, feels safe Incontinence Symptoms: no concerns  Vaccinations: up to date  Advanced Care Planning: A voluntary  discussion about advance care planning including the explanation and discussion of advance directives.  Discussed health care proxy and Living will, and the patient DOES NOT have a living will at present time. If patient does have living will, I have requested they bring this to the clinic to be scanned in to their chart.  Breast cancer: mammogram up to date  Osteoporosis: will update DEXA- order placed Fall prevention/vitamin D: taking calcium + D  Cholesterol- maintained on fenofibrate 160 daily and started on rosuvastatin 10 daily on 01/30/18 for continued elevated cholesterol. Reports she has been taking both medications daily, has not noted any adverse medication effects.  Lab Results  Component Value Date   CHOL 313 (H) 01/30/2018   HDL 40.40 01/30/2018   LDLCALC 90 07/11/2007   LDLDIRECT 202.0 01/30/2018   TRIG 302.0 (H) 01/30/2018   CHOLHDL 8 01/30/2018    Glucose:  Glucose, Bld  Date Value Ref Range Status  01/30/2018 90 70 - 99 mg/dL Final  02/09/2017 90 70 - 99 mg/dL Final  04/12/2016 96 70 - 99 mg/dL Final  07/13/2006 86 70 - 99 mg/dL Final    Skin cancer: routinely wears sunscreen  Colorectal cancer:colonoscopy overdue, she received letter from GI to schedule, plans to call and make appointment  Aspirin: taking 81 daily ECG: not indicated   Patient  Active Problem List   Diagnosis Date Noted  . Left hip pain 10/25/2017  . Anxiety and depression 05/23/2017  . Hypertriglyceridemia 04/21/2016  . Routine general medical examination at a health care facility 01/05/2016  . Medicare annual wellness visit, subsequent 01/05/2016  . Allergic rhinitis 12/29/2015  . Vaginal atrophy 10/22/2013  . DJD (degenerative joint disease) of knee 10/18/2012  . NEPHRECTOMY, HX OF 05/28/2009  . Hypothyroidism, postsurgical 07/18/2007  . HYPERLIPIDEMIA 07/18/2007  . Essential hypertension 07/18/2007  . OSTEOPENIA 07/15/2007  . History of colonic polyps 07/15/2007    Past  Surgical History:  Procedure Laterality Date  . BREAST EXCISIONAL BIOPSY Right 1983  . COLONOSCOPY W/ POLYPECTOMY  2003,2006,2013   [name]  . DILATION AND CURETTAGE OF UTERUS     X2  . NEPHRECTOMY  2010   Left for 9.8cm benign complex cyst  . THYROIDECTOMY  2000   [name]  . TONSILLECTOMY    . TOTAL ABDOMINAL HYSTERECTOMY W/ BILATERAL SALPINGOOPHORECTOMY  2003   [name] for dysfunctional menses    Family History  Problem Relation Age of Onset  . Transient ischemic attack Father        in 65s  . Coronary artery disease Father        stents @ 41  . Nephrolithiasis Father   . Hyperlipidemia Mother   . Transient ischemic attack Mother 35  . Thyroid disease Mother        hyperthyroid  . Urolithiasis Sister   . Diabetes Neg Hx     Social History   Socioeconomic History  . Marital status: Married    Spouse name: Not on file  . Number of children: 2  . Years of education: Not on file  . Highest education level: Not on file  Occupational History  . Occupation: Press photographer  Social Needs  . Financial resource strain: Not hard at all  . Food insecurity:    Worry: Never true    Inability: Never true  . Transportation needs:    Medical: No    Non-medical: No  Tobacco Use  . Smoking status: Former Smoker    Packs/day: 0.50    Years: 4.00    Pack years: 2.00    Last attempt to quit: 08/29/1975    Years since quitting: 42.5  . Smokeless tobacco: Never Used  . Tobacco comment: smoked 1973-1977, up to 1/2 ppd  Substance and Sexual Activity  . Alcohol use: Yes    Alcohol/week: 0.0 oz    Comment: minimally  1 every 6 months  . Drug use: No  . Sexual activity: Yes    Comment: HYST  Lifestyle  . Physical activity:    Days per week: 3 days    Minutes per session: 40 min  . Stress: To some extent  Relationships  . Social connections:    Talks on phone: More than three times a week    Gets together: More than three times a week    Attends religious service: 1 to 4 times  per year    Active member of club or organization: Yes    Attends meetings of clubs or organizations: 1 to 4 times per year    Relationship status: Married  . Intimate partner violence:    Fear of current or ex partner: No    Emotionally abused: No    Physically abused: No    Forced sexual activity: No  Other Topics Concern  . Not on file  Social History Narrative  Fun/Hobbies: Grandkids     Current Outpatient Medications:  .  acetaminophen (TYLENOL ARTHRITIS PAIN) 650 MG CR tablet, Take 650 mg by mouth 4 (four) times daily as needed. , Disp: , Rfl:  .  ALPRAZolam (XANAX) 0.5 MG tablet, TAKE 1 TABLET BY MOUTH AT BEDTIME AS NEEDED FOR ANXIETY OR SLEEP., Disp: 30 tablet, Rfl: 1 .  amLODipine (NORVASC) 5 MG tablet, Take 1 tablet (5 mg total) by mouth daily. Must keep appt w/new provider for future refills, Disp: 90 tablet, Rfl: 1 .  aspirin 81 MG tablet, Take 81 mg by mouth daily.  , Disp: , Rfl:  .  benazepril (LOTENSIN) 20 MG tablet, TAKE 1 AND 1/2 TABLETS (30 MG TOTAL) BY MOUTH DAILY., Disp: 135 tablet, Rfl: 1 .  Calcium Carbonate-Vitamin D (CALTRATE 600+D PO), Take by mouth daily.  , Disp: , Rfl:  .  fenofibrate 160 MG tablet, TAKE 1 TABLET BY MOUTH EVERY DAY, Disp: 90 tablet, Rfl: 1 .  levothyroxine (SYNTHROID, LEVOTHROID) 100 MCG tablet, TAKE 1 TABLET BY MOUTH EVERY DAY EXECPT 1 AND 1/2 TABLETS ON WEDNESDAYS, Disp: 90 tablet, Rfl: 0 .  metoprolol succinate (TOPROL-XL) 50 MG 24 hr tablet, TAKE 1 TABLET BY MOUTH EVERY DAY, Disp: 30 tablet, Rfl: 0 .  Misc Natural Products (TART CHERRY ADVANCED PO), Take by mouth., Disp: , Rfl:  .  Multiple Vitamins-Minerals (CENTRUM PO), Take by mouth daily.  , Disp: , Rfl:  .  potassium citrate (UROCIT-K 10) 10 MEQ (1080 MG) SR tablet, Take 10 mEq by mouth daily. , Disp: , Rfl:  .  rosuvastatin (CRESTOR) 10 MG tablet, Take 1 tablet (10 mg total) by mouth daily., Disp: 30 tablet, Rfl: 1 .  traMADol (ULTRAM) 50 MG tablet, Take 1 tablet (50 mg total) by  mouth every 8 (eight) hours as needed., Disp: 30 tablet, Rfl: 0  Allergies  Allergen Reactions  . Codeine Sulfate     "made me jumpy"  . Erythromycin     nausea     ROS  Constitutional: Negative for fever or weight change.  Respiratory: Negative for cough and shortness of breath.   Cardiovascular: Negative for chest pain or palpitations.  Gastrointestinal: Negative for abdominal pain, no bowel changes, no rectal bleeding. Musculoskeletal: Negative for gait problem or joint swelling.  Skin: Negative for rash.  Neurological: Negative for dizziness or headache.  No other specific complaints in a complete review of systems (except as listed in HPI above).   Objective  Vitals:   02/15/18 1329 02/15/18 1415  BP: (!) 160/82 140/78  Pulse: 73   Resp: 16   Temp: 98.4 F (36.9 C)   TempSrc: Oral   SpO2: 98%   Weight: 151 lb (68.5 kg)   Height: 5' 5.5" (1.664 m)     Body mass index is 24.75 kg/m.  Physical Exam Vital signs reviewed. Constitutional: Patient appears well-developed and well-nourished. No distress.  HENT: Head: Normocephalic and atraumatic. Ears: B TMs ok, no erythema or effusion; Nose: Nose normal. Mouth/Throat: Oropharynx is clear and moist. No oropharyngeal exudate.  Eyes: Conjunctivae and EOM are normal. Pupils are equal, round, and reactive to light. No scleral icterus.  Neck: Normal range of motion. Neck supple. No cervical adenopathy. No thyromegaly present.  Cardiovascular: Normal rate, regular rhythm and normal heart sounds.  No murmur heard. No BLE edema. Distal pulses intact Pulmonary/Chest: Effort normal and breath sounds normal. No respiratory distress. Abdominal: Soft. Bowel sounds are normal, no distension. There is no tenderness. no  masses Breast: defd to mammogram Musculoskeletal: Normal range of motion. No gross deformities Neurological: She is alert and oriented to person, place, and time. No cranial nerve deficit. Coordination, balance,  strength, speech and gait are normal.  Skin: Skin is warm and dry. No rash noted. No erythema.  Psychiatric: Patient has a normal mood and affect. behavior is normal. Judgment and thought content normal.   Fall Risk: Fall Risk  02/15/2018 02/02/2017 01/05/2016 11/17/2015 10/16/2014  Falls in the past year? No No No No No     Assessment & Plan She will return at end of July for testing ordered today including her lipid panel, CBC, DEXA  RTC in 6 months for routine F/U: hypothyroidism- check TSH CMET, TSH done 01/30/18 Routinely follows with Dr Alinda Money, urology for renal monitoring s/p nephrectomy Has routinely been following with [facility]YN for [facility], her provider recently retired and she has not decided if she will continue routine GYN care

## 2018-02-17 ENCOUNTER — Encounter: Payer: Self-pay | Admitting: Nurse Practitioner

## 2018-02-17 NOTE — Assessment & Plan Note (Signed)
TSH up to date Continue synthroid at current dosage RTC in 6 months for F/U: recheck TSH

## 2018-02-17 NOTE — Assessment & Plan Note (Signed)
-  USPSTF grade A and B recommendations reviewed with patient; age-appropriate recommendations, preventive care, screening tests, etc discussed and encouraged; healthy living and sunscreen use encouraged; see AVS for patient education given to patient. Advanced directives packet given. -Discussed importance of 150 minutes of physical activity weekly, eat 6 servings of fruit/vegetables daily and drink plenty of water and avoid sweet beverages.  -Follow up and care instructions discussed and provided in AVS.  -Reviewed Health Maintenance:  Encouraged to F/U with GI for screening colonoscopy Screening for osteoporosis- DG Bone Density; Future

## 2018-02-17 NOTE — Assessment & Plan Note (Signed)
Stable Continue xanax at bedtime prn F/U for new, worsening symptoms

## 2018-02-17 NOTE — Assessment & Plan Note (Signed)
Started rosuvastatin earlier in June, she was instructed to return in about 6 weeks for updated fasting lipid panel. Will update CBC the same day Continue fenofibrate, rosuvastatin F/U with further recommendations pending lab results - CBC; Future - Lipid panel; Future

## 2018-02-17 NOTE — Progress Notes (Signed)
Medical screening examination/treatment/procedure(s) were performed by the Wellness Coach, RN. As primary care provider I was immediately available for consulation/collaboration. I agree with above documentation. Ladavia Lindenbaum, NP  

## 2018-02-17 NOTE — Assessment & Plan Note (Signed)
BP elevated on first reading today, normal on second reading with continued report of normal readings at home She will continue to monitor home BP readings and F/U for readings >140/90

## 2018-02-26 ENCOUNTER — Other Ambulatory Visit: Payer: Self-pay | Admitting: Nurse Practitioner

## 2018-02-26 DIAGNOSIS — I10 Essential (primary) hypertension: Secondary | ICD-10-CM

## 2018-02-26 DIAGNOSIS — E782 Mixed hyperlipidemia: Secondary | ICD-10-CM

## 2018-02-27 ENCOUNTER — Other Ambulatory Visit: Payer: Self-pay | Admitting: Nurse Practitioner

## 2018-02-28 ENCOUNTER — Other Ambulatory Visit: Payer: Self-pay | Admitting: Nurse Practitioner

## 2018-02-28 DIAGNOSIS — E890 Postprocedural hypothyroidism: Secondary | ICD-10-CM

## 2018-03-17 ENCOUNTER — Other Ambulatory Visit: Payer: Self-pay | Admitting: Nurse Practitioner

## 2018-03-17 DIAGNOSIS — F329 Major depressive disorder, single episode, unspecified: Secondary | ICD-10-CM

## 2018-03-17 DIAGNOSIS — F32A Depression, unspecified: Secondary | ICD-10-CM

## 2018-03-17 DIAGNOSIS — F419 Anxiety disorder, unspecified: Principal | ICD-10-CM

## 2018-03-22 ENCOUNTER — Ambulatory Visit (INDEPENDENT_AMBULATORY_CARE_PROVIDER_SITE_OTHER)
Admission: RE | Admit: 2018-03-22 | Discharge: 2018-03-22 | Disposition: A | Discharge status: Home / Self Care | Payer: Medicare Other | Source: Ambulatory Visit | Attending: Nurse Practitioner | Admitting: Nurse Practitioner

## 2018-03-22 ENCOUNTER — Other Ambulatory Visit (INDEPENDENT_AMBULATORY_CARE_PROVIDER_SITE_OTHER): Payer: Medicare Other

## 2018-03-22 DIAGNOSIS — E782 Mixed hyperlipidemia: Secondary | ICD-10-CM

## 2018-03-22 DIAGNOSIS — Z13820 Encounter for screening for osteoporosis: Secondary | ICD-10-CM | POA: Diagnosis not present

## 2018-03-22 DIAGNOSIS — Z0000 Encounter for general adult medical examination without abnormal findings: Secondary | ICD-10-CM

## 2018-03-22 LAB — CBC
HEMATOCRIT: 43.3 % (ref 36.0–46.0)
Hemoglobin: 14.6 g/dL (ref 12.0–15.0)
MCHC: 33.7 g/dL (ref 30.0–36.0)
MCV: 90.4 fl (ref 78.0–100.0)
Platelets: 342 10*3/uL (ref 150.0–400.0)
RBC: 4.79 Mil/uL (ref 3.87–5.11)
RDW: 13.1 % (ref 11.5–15.5)
WBC: 8 10*3/uL (ref 4.0–10.5)

## 2018-03-22 LAB — LIPID PANEL
CHOLESTEROL: 253 mg/dL — AB (ref 0–200)
HDL: 44.3 mg/dL (ref >39.00)
NonHDL: 208.72
Total CHOL/HDL Ratio: 6
Triglycerides: 303 mg/dL — ABNORMAL HIGH (ref 0.0–149.0)
VLDL: 60.6 mg/dL — AB (ref 0.0–40.0)

## 2018-03-22 LAB — LDL CHOLESTEROL, DIRECT: LDL DIRECT: 154 mg/dL

## 2018-03-23 ENCOUNTER — Other Ambulatory Visit: Payer: Self-pay | Admitting: Nurse Practitioner

## 2018-03-23 DIAGNOSIS — I10 Essential (primary) hypertension: Secondary | ICD-10-CM

## 2018-03-23 DIAGNOSIS — E782 Mixed hyperlipidemia: Secondary | ICD-10-CM

## 2018-03-24 ENCOUNTER — Other Ambulatory Visit: Payer: Self-pay | Admitting: Nurse Practitioner

## 2018-03-29 ENCOUNTER — Other Ambulatory Visit: Payer: Self-pay | Admitting: Nurse Practitioner

## 2018-03-29 DIAGNOSIS — E782 Mixed hyperlipidemia: Secondary | ICD-10-CM

## 2018-03-29 MED ORDER — ROSUVASTATIN CALCIUM 20 MG PO TABS: 20.0000 mg | ORAL_TABLET | Freq: Every day | ORAL | 1 refills | Status: DC

## 2018-04-08 ENCOUNTER — Other Ambulatory Visit: Payer: Self-pay | Admitting: Nurse Practitioner

## 2018-04-08 DIAGNOSIS — E782 Mixed hyperlipidemia: Secondary | ICD-10-CM

## 2018-04-09 ENCOUNTER — Other Ambulatory Visit: Payer: Self-pay | Admitting: Nurse Practitioner

## 2018-04-11 ENCOUNTER — Other Ambulatory Visit: Payer: Self-pay | Admitting: Nurse Practitioner

## 2018-04-11 DIAGNOSIS — E782 Mixed hyperlipidemia: Secondary | ICD-10-CM

## 2018-04-11 MED ORDER — EZETIMIBE 10 MG PO TABS
10.0000 mg | ORAL_TABLET | Freq: Every day | ORAL | 1 refills | Status: DC
Start: 2018-04-11 — End: 2018-05-04

## 2018-04-11 MED ORDER — ROSUVASTATIN CALCIUM 10 MG PO TABS: 10.0000 mg | ORAL_TABLET | ORAL | 1 refills | Status: DC

## 2018-04-25 ENCOUNTER — Other Ambulatory Visit: Payer: Self-pay | Admitting: Nurse Practitioner

## 2018-04-25 DIAGNOSIS — F329 Major depressive disorder, single episode, unspecified: Secondary | ICD-10-CM

## 2018-04-25 DIAGNOSIS — F419 Anxiety disorder, unspecified: Principal | ICD-10-CM

## 2018-04-25 NOTE — Telephone Encounter (Signed)
[place] Controlled Database Checked Last filled: 03/18/18 # 30 LOV w/you: 02/15/18 Next appt w/you: 08/16/18

## 2018-05-04 ENCOUNTER — Other Ambulatory Visit: Payer: Self-pay | Admitting: Nurse Practitioner

## 2018-05-04 DIAGNOSIS — E782 Mixed hyperlipidemia: Secondary | ICD-10-CM

## 2018-05-14 ENCOUNTER — Ambulatory Visit: Payer: Self-pay | Admitting: Nurse Practitioner

## 2018-05-14 NOTE — Telephone Encounter (Signed)
Pt c/o moderate watery diarrhea. Pt denies abdominal pain, fever or blood in stool, nausea, vomiting. Pt is able to eat and drink without difficulty. Pt stated that she thought her diarrhea is being caused by the Zetia that she is taking. Pt stated that she has had diarrhea since starting the Zetia and stopped taking it for a while to see if her diarrhea would stop. Pt stated that her diarrhea stopped.  Pt restarted the Zetia 6 days ago and the diarrhea has come back.  Pt does complain of dry mouth but no dizziness. Care advice given and pt verbalized understanding. Pt given an appointment for 05/17/18. Pt stated that she is going to stop the Zetia until her appointment. Reason for Disposition . [1] MODERATE diarrhea (e.g., 4-6 times / day more than normal) AND [2] present > 48 hours (2 days)  Answer Assessment - Initial Assessment Questions 1. DIARRHEA SEVERITY: "How bad is the diarrhea?" "How many extra stools have you had in the past 24 hours than normal?"    - NO DIARRHEA (SCALE 0)   - MILD (SCALE 1-3): Few loose or mushy BMs; increase of 1-3 stools over normal daily number of stools; mild increase in ostomy output.   -  MODERATE (SCALE 4-7): Increase of 4-6 stools daily over normal; moderate increase in ostomy output. * SEVERE (SCALE 8-10; OR 'WORST POSSIBLE'): Increase of 7 or more stools daily over normal; moderate increase in ostomy output; incontinence.     Moderate (4) 2. ONSET: "When did the diarrhea begin?"      6 days ago after restarting Zetia 3. BM CONSISTENCY: "How loose or watery is the diarrhea?"      watery 4. VOMITING: "Are you also vomiting?" If so, ask: "How many times in the past 24 hours?"      no 5. ABDOMINAL PAIN: "Are you having any abdominal pain?" If yes: "What does it feel like?" (e.g., crampy, dull, intermittent, constant)      no 6. ABDOMINAL PAIN SEVERITY: If present, ask: "How bad is the pain?"  (e.g., Scale 1-10; mild, moderate, or severe)   - MILD (1-3):  doesn't interfere with normal activities, abdomen soft and not tender to touch    - MODERATE (4-7): interferes with normal activities or awakens from sleep, tender to touch    - SEVERE (8-10): excruciating pain, doubled over, unable to do any normal activities       No abdominal pain 7. ORAL INTAKE: If vomiting, "Have you been able to drink liquids?" "How much fluids have you had in the past 24 hours?"     yes 8. HYDRATION: "Any signs of dehydration?" (e.g., dry mouth [not just dry lips], too weak to stand, dizziness, new weight loss) "When did you last urinate?"     Dry mouth, weight loss 9. EXPOSURE: "Have you traveled to a foreign country recently?" "Have you been exposed to anyone with diarrhea?" "Could you have eaten any food that was spoiled?"     No-no-no 10. ANTIBIOTIC USE: "Are you taking antibiotics now or have you taken antibiotics in the past 2 months?"       no 11. OTHER SYMPTOMS: "Do you have any other symptoms?" (e.g., fever, blood in stool)       no 12. PREGNANCY: "Is there any chance you are pregnant?" "When was your last menstrual period?"       n/a  Protocols used: DIARRHEA-A-AH

## 2018-05-17 ENCOUNTER — Encounter: Payer: Self-pay | Admitting: Nurse Practitioner

## 2018-05-17 ENCOUNTER — Ambulatory Visit (INDEPENDENT_AMBULATORY_CARE_PROVIDER_SITE_OTHER)
Admission: RE | Admit: 2018-05-17 | Discharge: 2018-05-17 | Disposition: A | Discharge status: Home / Self Care | Payer: Medicare Other | Source: Ambulatory Visit | Attending: Nurse Practitioner | Admitting: Nurse Practitioner

## 2018-05-17 ENCOUNTER — Ambulatory Visit (INDEPENDENT_AMBULATORY_CARE_PROVIDER_SITE_OTHER): Payer: Medicare Other | Admitting: Nurse Practitioner

## 2018-05-17 VITALS — BP 148/88 | HR 73 | Ht 66.0 in | Wt 153.0 lb

## 2018-05-17 DIAGNOSIS — E782 Mixed hyperlipidemia: Secondary | ICD-10-CM | POA: Diagnosis not present

## 2018-05-17 DIAGNOSIS — M542 Cervicalgia: Secondary | ICD-10-CM

## 2018-05-17 DIAGNOSIS — I10 Essential (primary) hypertension: Secondary | ICD-10-CM

## 2018-05-17 DIAGNOSIS — Z23 Encounter for immunization: Secondary | ICD-10-CM | POA: Diagnosis not present

## 2018-05-17 DIAGNOSIS — E785 Hyperlipidemia, unspecified: Secondary | ICD-10-CM

## 2018-05-17 NOTE — Progress Notes (Signed)
Name: [name]   MRN: [id number]    DOB: Dec 30, 1945   Date:05/17/2018       Progress Note  Subjective  Chief Complaint Follow up  HPI [name] is here today for follow up of HLD, also requesting eval of neck pain and we will review her HTN today as well.  Cholesterol- had been maintained oo fenofibrate and lipitor, due to muscle aches lipitor was stopped and rosuvastatin started in June, she continued to have muscle aches and high cholesterol on 03/22/18 labs so rosuvastatin was decreased to MWF, fenofibrate was stopped, zetia was started She tried the zetia several times but every time she developed diarrhea so she stopped taking it. She says she is tolerating the crestor three times a week well  Lab Results  Component Value Date   CHOL 253 (H) 03/22/2018   HDL 44.30 03/22/2018   LDLCALC 90 07/11/2007   LDLDIRECT 154.0 03/22/2018   TRIG 303.0 (H) 03/22/2018   CHOLHDL 6 03/22/2018   The 10-year ASCVD risk score [name], et al., 2013) is: 21.6%   Values used to calculate the score:     Age: 72 years     Sex: Female     Is Non-Hispanic African American: No     Diabetic: No     Tobacco smoker: No     Systolic Blood Pressure: 628 mmHg     Is BP treated: Yes     HDL Cholesterol: 44.3 mg/dL     Total Cholesterol: 253 mg/dL  Hypertension -maintained on amlodipine 5, benazepril 30, metoprolol succinate 50 daily Reports daily medication compliance without adverse medication effects. Reports checking her BP regularly at home with readings <140/90 No headaches, vision changes, chest pain, shortness of breath.  BP Readings from Last 3 Encounters:  05/17/18 (!) 148/88  02/15/18 140/78  02/15/18 140/78   Neck pain- This is not a new problem, shes noticed aching and cracking noise in her neck for several months now, she did suffer from 2 falls earlier this year with lower back and hip pain, which she was seen by SM for, actually began to notice the neck pain after these falls  but was not evaluated for the neck pain. shes tried neck exercises, massage, but pain persists No fevers, weakness, numbness, tingling   Patient Active Problem List   Diagnosis Date Noted  . Left hip pain 10/25/2017  . Anxiety and depression 05/23/2017  . Hyperlipidemia 04/21/2016  . Routine general medical examination at a health care facility 01/05/2016  . Medicare annual wellness visit, subsequent 01/05/2016  . Allergic rhinitis 12/29/2015  . Vaginal atrophy 10/22/2013  . DJD (degenerative joint disease) of knee 10/18/2012  . NEPHRECTOMY, HX OF 05/28/2009  . Hypothyroidism, postsurgical 07/18/2007  . HYPERLIPIDEMIA 07/18/2007  . Essential hypertension 07/18/2007  . OSTEOPENIA 07/15/2007  . History of colonic polyps 07/15/2007    Past Surgical History:  Procedure Laterality Date  . BREAST EXCISIONAL BIOPSY Right 1983  . COLONOSCOPY W/ POLYPECTOMY  2003,2006,2013   [name]  . DILATION AND CURETTAGE OF UTERUS     X2  . NEPHRECTOMY  2010   Left for 9.8cm benign complex cyst  . THYROIDECTOMY  2000   [name]  . TONSILLECTOMY    . TOTAL ABDOMINAL HYSTERECTOMY W/ BILATERAL SALPINGOOPHORECTOMY  2003   [name] for dysfunctional menses    Family History  Problem Relation Age of Onset  . Transient ischemic attack Father        in 1s  .  Coronary artery disease Father        stents @ 77  . Nephrolithiasis Father   . Hyperlipidemia Mother   . Transient ischemic attack Mother 81  . Thyroid disease Mother        hyperthyroid  . Urolithiasis Sister   . Diabetes Neg Hx     Social History   Socioeconomic History  . Marital status: Married    Spouse name: Not on file  . Number of children: 2  . Years of education: Not on file  . Highest education level: Not on file  Occupational History  . Occupation: Press photographer  Social Needs  . Financial resource strain: Not hard at all  . Food insecurity:    Worry: Never true    Inability: Never true  . Transportation needs:     Medical: No    Non-medical: No  Tobacco Use  . Smoking status: Former Smoker    Packs/day: 0.50    Years: 4.00    Pack years: 2.00    Last attempt to quit: 08/29/1975    Years since quitting: 42.7  . Smokeless tobacco: Never Used  . Tobacco comment: smoked 1973-1977, up to 1/2 ppd  Substance and Sexual Activity  . Alcohol use: Yes    Alcohol/week: 0.0 standard drinks    Comment: minimally  1 every 6 months  . Drug use: No  . Sexual activity: Yes    Comment: HYST  Lifestyle  . Physical activity:    Days per week: 3 days    Minutes per session: 40 min  . Stress: To some extent  Relationships  . Social connections:    Talks on phone: More than three times a week    Gets together: More than three times a week    Attends religious service: 1 to 4 times per year    Active member of club or organization: Yes    Attends meetings of clubs or organizations: 1 to 4 times per year    Relationship status: Married  . Intimate partner violence:    Fear of current or ex partner: No    Emotionally abused: No    Physically abused: No    Forced sexual activity: No  Other Topics Concern  . Not on file  Social History Narrative   Fun/Hobbies: Grandkids     Current Outpatient Medications:  .  acetaminophen (TYLENOL ARTHRITIS PAIN) 650 MG CR tablet, Take 650 mg by mouth 4 (four) times daily as needed. , Disp: , Rfl:  .  ALPRAZolam (XANAX) 0.5 MG tablet, TAKE 1 TABLET BY MOUTH AT BEDTIME AS NEEDED FOR ANXIETY OR SLEEP, Disp: 30 tablet, Rfl: 0 .  amLODipine (NORVASC) 5 MG tablet, TAKE 1 TABLET (5 MG TOTAL) BY MOUTH DAILY. MUST KEEP APPT W/NEW PROVIDER FOR FUTURE REFILLS, Disp: 90 tablet, Rfl: 1 .  aspirin 81 MG tablet, Take 81 mg by mouth daily.  , Disp: , Rfl:  .  benazepril (LOTENSIN) 20 MG tablet, TAKE 1 AND 1/2 TABLETS BY MOUTH DAILY., Disp: 135 tablet, Rfl: 1 .  Calcium Carbonate-Vitamin D (CALTRATE 600+D PO), Take by mouth daily.  , Disp: , Rfl:  .  levothyroxine (SYNTHROID, LEVOTHROID)  100 MCG tablet, TAKE 1 TABLET BY MOUTH EVERY DAY EXECPT 1 AND 1/2 TABLETS ON WEDNESDAYS, Disp: 90 tablet, Rfl: 0 .  metoprolol succinate (TOPROL-XL) 50 MG 24 hr tablet, TAKE 1 TABLET BY MOUTH EVERY DAY, Disp: 30 tablet, Rfl: 5 .  Misc Natural Products (TART CHERRY ADVANCED  PO), Take by mouth., Disp: , Rfl:  .  Multiple Vitamins-Minerals (CENTRUM PO), Take by mouth daily.  , Disp: , Rfl:  .  potassium citrate (UROCIT-K 10) 10 MEQ (1080 MG) SR tablet, Take 10 mEq by mouth daily. , Disp: , Rfl:  .  rosuvastatin (CRESTOR) 10 MG tablet, Take 1 tablet (10 mg total) by mouth 3 (three) times a week. Monday, Wednesday, and Friday, Disp: 30 tablet, Rfl: 1 .  traMADol (ULTRAM) 50 MG tablet, Take 1 tablet (50 mg total) by mouth every 8 (eight) hours as needed., Disp: 30 tablet, Rfl: 0 .  ezetimibe (ZETIA) 10 MG tablet, TAKE 1 TABLET BY MOUTH EVERY DAY (Patient not taking: Reported on 05/17/2018), Disp: 30 tablet, Rfl: 1  Allergies  Allergen Reactions  . Codeine Sulfate     "made me jumpy"  . Erythromycin     nausea     ROS See HPI  Objective  Vitals:   05/17/18 0829 05/17/18 0851  BP: (!) 170/90 (!) 148/88  Pulse: 73   SpO2: 98%   Weight: 153 lb (69.4 kg)   Height: 5\' 6"  (1.676 m)     Body mass index is 24.69 kg/m.  Physical Exam Vital signs reviewed. Constitutional: Patient appears well-developed and well-nourished. No distress.  HENT: Head: Normocephalic and atraumatic.  Nose: Nose normal. Mouth/Throat: Oropharynx is clear and moist. No oropharyngeal exudate.  Eyes: Conjunctivae and EOM are normal. Pupils are equal, round, and reactive to light. No scleral icterus.  Neck: Normal range of motion. Neck supple.  Cardiovascular: Normal rate, regular rhythm and  Distal pulses intact. Pulmonary/Chest: Effort normal. No respiratory distress. Musculoskeletal: Normal range of motion, No gross deformities Neurological: She is alert and oriented to person, place, and time. No cranial nerve  deficit. Coordination, balance, strength, speech and gait are normal.  Skin: Skin is warm and dry. No rash noted. No erythema.  Psychiatric: Patient has a normal mood and affect. behavior is normal. Judgment and thought content normal.    Assessment & Plan RTC in 3 months for routine F/U: check TSH, recheck BP  -Reviewed Health Maintenance: Need for influenza vaccination- Flu vaccine HIGH DOSE PF  Neck pain Update imaging F/U with further recommendations pending lab results Discussed home management of neck pain including daily stretching/neck exercises - DG Cervical Spine Complete; Future

## 2018-05-17 NOTE — Patient Instructions (Addendum)
Please head downstairs for a neck xray If any of your test results are critically abnormal, you will be contacted right away. Otherwise, I will contact you within a week about your test results and follow up recommendations  I have placed a referral to lipid clinic. Our office will begin processing this referral. Please follow up if you have not heard anything about this referral within 10 days.  Please keep an eye on your BP at home, let me know if readings >140/90  Please return to see me in about 3 months for routine follow up.   Cholesterol Cholesterol is a fat. Your body needs a small amount of cholesterol. Cholesterol (plaque) may build up in your blood vessels (arteries). That makes you more likely to have a heart attack or stroke. You cannot feel your cholesterol level. Having a blood test is the only way to find out if your level is high. Keep your test results. Work with your doctor to keep your cholesterol at a good level. What do the results mean?  Total cholesterol is how much cholesterol is in your blood.  LDL is bad cholesterol. This is the type that can build up. Try to have low LDL.  HDL is good cholesterol. It cleans your blood vessels and carries LDL away. Try to have high HDL.  Triglycerides are fat that the body can store or burn for energy. What are good levels of cholesterol?  Total cholesterol below 200.  LDL below 100 is good for people who have health risks. LDL below 70 is good for people who have very high risks.  HDL above 40 is good. It is best to have HDL of 60 or higher.  Triglycerides below 150. How can I lower my cholesterol? Diet Follow your diet program as told by your doctor.  Choose fish, white meat chicken, or [place] that is roasted or baked. Try not to eat red meat, fried foods, sausage, or lunch meats.  Eat lots of fresh fruits and vegetables.  Choose whole grains, beans, pasta, potatoes, and cereals.  Choose olive oil, corn oil, or  canola oil. Only use small amounts.  Try not to eat butter, mayonnaise, shortening, or palm kernel oils.  Try not to eat foods with trans fats.  Choose low-fat or nonfat dairy foods. ? Drink skim or nonfat milk. ? Eat low-fat or nonfat yogurt and cheeses. ? Try not to drink whole milk or cream. ? Try not to eat ice cream, egg yolks, or full-fat cheeses.  Healthy desserts include angel food cake, ginger snaps, animal crackers, hard candy, popsicles, and low-fat or nonfat frozen yogurt. Try not to eat pastries, cakes, pies, and cookies.  Exercise Follow your exercise program as told by your doctor.  Be more active. Try gardening, walking, and taking the stairs.  Ask your doctor about ways that you can be more active.  Medicine  Take over-the-counter and prescription medicines only as told by your doctor. This information is not intended to replace advice given to you by your health care provider. Make sure you discuss any questions you have with your health care provider. Document Released: 11/10/2008 Document Revised: 03/15/2016 Document Reviewed: 02/24/2016 Elsevier Interactive Patient Education  [employer].

## 2018-05-17 NOTE — Assessment & Plan Note (Signed)
Multiple medication intolerances and adverse effects now with continued HLD and elevated ASCVD We discussed referral to lipid clinic for further evaluation and she is agreeable Continue crestor at current dosage for now Additional education provided in AVS - AMB Referral to [facility]

## 2018-05-17 NOTE — Assessment & Plan Note (Signed)
BP readings continue to be elevated in office with normal readings at home Continue current medications continue to monitor home BP readings and F/U for readings >140/90  

## 2018-05-21 ENCOUNTER — Other Ambulatory Visit: Payer: Self-pay | Admitting: Nurse Practitioner

## 2018-05-21 DIAGNOSIS — F329 Major depressive disorder, single episode, unspecified: Secondary | ICD-10-CM

## 2018-05-21 DIAGNOSIS — F419 Anxiety disorder, unspecified: Principal | ICD-10-CM

## 2018-05-21 NOTE — Telephone Encounter (Signed)
Check [place] registry last filled 04/25/2018.Marland KitchenJohny Carter

## 2018-05-27 ENCOUNTER — Other Ambulatory Visit: Payer: Self-pay | Admitting: Nurse Practitioner

## 2018-05-27 DIAGNOSIS — E890 Postprocedural hypothyroidism: Secondary | ICD-10-CM

## 2018-06-01 ENCOUNTER — Other Ambulatory Visit: Payer: Self-pay | Admitting: Nurse Practitioner

## 2018-06-01 DIAGNOSIS — E782 Mixed hyperlipidemia: Secondary | ICD-10-CM

## 2018-06-18 ENCOUNTER — Other Ambulatory Visit: Payer: Self-pay | Admitting: Nurse Practitioner

## 2018-06-18 ENCOUNTER — Other Ambulatory Visit: Payer: Self-pay | Admitting: Family

## 2018-06-18 DIAGNOSIS — F329 Major depressive disorder, single episode, unspecified: Secondary | ICD-10-CM

## 2018-06-18 DIAGNOSIS — F419 Anxiety disorder, unspecified: Principal | ICD-10-CM

## 2018-06-18 DIAGNOSIS — Z1231 Encounter for screening mammogram for malignant neoplasm of breast: Secondary | ICD-10-CM

## 2018-06-18 NOTE — Telephone Encounter (Signed)
[place] Controlled Database Checked Last filled: 05/23/18 # 30 Last OV w/ PCP: 05/17/18 Next appt w/PCP: 08/16/18

## 2018-06-23 NOTE — Progress Notes (Signed)
[facility] [place], [place] [address] Phone: [phone] Subjective:    I [name] am serving as a Education administrator for Dr. Hulan Saas.    CC: Left hip and back pain follow-up  EUM:[id number]  [name] is a 72 y.o. female coming in with complaint of left hip pain. Has xrays. Balance is off. States that it feels like the leg is giving way.   Onset- Chronic Location- bursitis, glut Duration-has been going on for months initially but recent fall did have worsening symptoms. Character- achy, sore Aggravating factors- sitting to standing, shower, laying on it  Therapies tried- meloxicam, Icy hot Severity-7 out of 10   Patient never went to the physical therapy.  Patient did have x-rays taken months ago.  Was found to have degenerative disc disease  Past Medical History:  Diagnosis Date  . Adenomatous colon polyp   . Arthritis   . Cataract   . Heart murmur    MVP  . Hyperlipidemia   . Hypertension   . Thyroid disease    Past Surgical History:  Procedure Laterality Date  . BREAST EXCISIONAL BIOPSY Right 1983  . COLONOSCOPY W/ POLYPECTOMY  2003,2006,2013   [name]  . DILATION AND CURETTAGE OF UTERUS     X2  . NEPHRECTOMY  2010   Left for 9.8cm benign complex cyst  . THYROIDECTOMY  2000   [name]  . TONSILLECTOMY    . TOTAL ABDOMINAL HYSTERECTOMY W/ BILATERAL SALPINGOOPHORECTOMY  2003   [name] for dysfunctional menses   Social History   Socioeconomic History  . Marital status: Married    Spouse name: Not on file  . Number of children: 2  . Years of education: Not on file  . Highest education level: Not on file  Occupational History  . Occupation: Press photographer  Social Needs  . Financial resource strain: Not hard at all  . Food insecurity:    Worry: Never true    Inability: Never true  . Transportation needs:    Medical: No    Non-medical: No  Tobacco Use  . Smoking status: Former Smoker    Packs/day: 0.50   Years: 4.00    Pack years: 2.00    Last attempt to quit: 08/29/1975    Years since quitting: 42.8  . Smokeless tobacco: Never Used  . Tobacco comment: smoked 1973-1977, up to 1/2 ppd  Substance and Sexual Activity  . Alcohol use: Yes    Alcohol/week: 0.0 standard drinks    Comment: minimally  1 every 6 months  . Drug use: No  . Sexual activity: Yes    Comment: HYST  Lifestyle  . Physical activity:    Days per week: 3 days    Minutes per session: 40 min  . Stress: To some extent  Relationships  . Social connections:    Talks on phone: More than three times a week    Gets together: More than three times a week    Attends religious service: 1 to 4 times per year    Active member of club or organization: Yes    Attends meetings of clubs or organizations: 1 to 4 times per year    Relationship status: Married  Other Topics Concern  . Not on file  Social History Narrative   Fun/Hobbies: Grandkids   Allergies  Allergen Reactions  . Codeine Sulfate     "made me jumpy"  . Erythromycin     nausea  Family History  Problem Relation Age of Onset  . Transient ischemic attack Father        in 65s  . Coronary artery disease Father        stents @ 41  . Nephrolithiasis Father   . Hyperlipidemia Mother   . Transient ischemic attack Mother 45  . Thyroid disease Mother        hyperthyroid  . Urolithiasis Sister   . Diabetes Neg Hx     Current Outpatient Medications (Endocrine & Metabolic):  .  levothyroxine (SYNTHROID, LEVOTHROID) 100 MCG tablet, TAKE 1 TABLET BY MOUTH EVERY DAY EXECPT 1 AND 1/2 TABLETS ON WEDNESDAYS  Current Outpatient Medications (Cardiovascular):  .  amLODipine (NORVASC) 5 MG tablet, TAKE 1 TABLET (5 MG TOTAL) BY MOUTH DAILY. MUST KEEP APPT W/NEW PROVIDER FOR FUTURE REFILLS .  benazepril (LOTENSIN) 20 MG tablet, TAKE 1 AND 1/2 TABLETS BY MOUTH DAILY. .  metoprolol succinate (TOPROL-XL) 50 MG 24 hr tablet, TAKE 1 TABLET BY MOUTH EVERY DAY .  rosuvastatin  (CRESTOR) 10 MG tablet, Take 1 tablet (10 mg total) by mouth 3 (three) times a week. Monday, Wednesday, and Friday   Current Outpatient Medications (Analgesics):  .  acetaminophen (TYLENOL ARTHRITIS PAIN) 650 MG CR tablet, Take 650 mg by mouth 4 (four) times daily as needed.  [name]  aspirin 81 MG tablet, Take 81 mg by mouth daily.   .  meloxicam (MOBIC) 7.5 MG tablet, Take 1 tablet (7.5 mg total) by mouth daily.   Current Outpatient Medications (Other):  [name]  ALPRAZolam (XANAX) 0.5 MG tablet, TAKE 1 TABLET BY MOUTH AT BEDTIME AS NEEDED FOR ANXIETY OR SLEEP .  Calcium Carbonate-Vitamin D (CALTRATE 600+D PO), Take by mouth daily.   .  Misc Natural Products (TART CHERRY ADVANCED PO), Take by mouth. .  Multiple Vitamins-Minerals (CENTRUM PO), Take by mouth daily.   .  potassium citrate (UROCIT-K 10) 10 MEQ (1080 MG) SR tablet, Take 10 mEq by mouth daily.  [name]  gabapentin (NEURONTIN) 100 MG capsule, Take 2 capsules (200 mg total) by mouth at bedtime.    Past medical history, social, surgical and family history all reviewed in electronic medical record.  No pertanent information unless stated regarding to the chief complaint.   Review of Systems:  No headache, visual changes, nausea, vomiting, diarrhea, constipation, dizziness, abdominal pain, skin rash, fevers, chills, night sweats, weight loss, swollen lymph nodes, body aches, joint swelling, muscle aches, chest pain, shortness of breath, mood changes.   Objective  Blood pressure (!) 142/70, pulse 86, height 5\' 6"  (1.676 m), weight 153 lb (69.4 kg), SpO2 96 %.    General: No apparent distress alert and oriented x3 mood and affect normal, dressed appropriately.  HEENT: Pupils equal, extraocular movements intact  Respiratory: Patient's speak in full sentences and does not appear short of breath  Cardiovascular: No lower extremity edema, non tender, no erythema  Skin: Warm dry intact with no signs of infection or rash on extremities or on axial  skeleton.  Abdomen: Soft nontender  Neuro: Cranial nerves II through XII are intact, neurovascularly intact in all extremities with  2+ pulses.  Lymph: No lymphadenopathy of posterior or anterior cervical chain or axillae bilaterally.   MSK:  Non tender with full range of motion and good stability and symmetric strength and tone of shoulders, elbows, wrist, hip, knee and ankles bilaterally.  Mild arthritic changes of multiple joints left hip is tender to palpation over the lateral greater trochanteric area  Back exam though does have some loss of lordosis.  Positive straight leg test on the left side.  Weakness with dorsiflexion and a 1+ Achilles tendon reflex on the left compared to the contralateral side.  Severely antalgic gait      Impression and Recommendations:     The above documentation has been reviewed and is accurate and complete Lyndal Pulley, DO       Note: This dictation was prepared with Dragon dictation along with smaller phrase technology. Jaclyn transcriptional errors that result from this process are unintentional.

## 2018-06-24 ENCOUNTER — Encounter: Payer: Self-pay | Admitting: Family Medicine

## 2018-06-24 ENCOUNTER — Ambulatory Visit: Payer: Medicare Other | Admitting: Family Medicine

## 2018-06-24 VITALS — BP 142/70 | HR 86 | Ht 66.0 in | Wt 153.0 lb

## 2018-06-24 DIAGNOSIS — M25552 Pain in left hip: Secondary | ICD-10-CM

## 2018-06-24 DIAGNOSIS — M5416 Radiculopathy, lumbar region: Secondary | ICD-10-CM | POA: Diagnosis not present

## 2018-06-24 MED ORDER — MELOXICAM 7.5 MG PO TABS: 7.5000 mg | ORAL_TABLET | Freq: Every day | ORAL | 0 refills | Status: DC

## 2018-06-24 MED ORDER — GABAPENTIN 100 MG PO CAPS: 200.0000 mg | ORAL_CAPSULE | Freq: Every day | ORAL | 3 refills | Status: DC

## 2018-06-24 NOTE — Assessment & Plan Note (Signed)
Patient does have a left lumbar radiculopathy.  Mild weakness and decreased deep tendon reflex.  Patient has had some back pain for now going on 8 months with no significant improvement overall.  Most recent exacerbation was secondary to a fall and now has weakness.  I do believe an MRI is necessary and patient could be a candidate for an injection if necessary.  Refill the meloxicam that seems to be helping a little bit gabapentin given at night.  Discussed icing regimen.  Follow-up after imaging will discuss further treatment options.

## 2018-06-24 NOTE — Patient Instructions (Signed)
Good to see you  Ice 20 minutes 2 times daily. Usually after activity and before bed. Gabapentin 200mg  at night Refilled the meloxicam as well and can take it daily  Ordered MRI and call [phone] to schedule.  I will write you in my chart and tell you the next steps

## 2018-06-28 ENCOUNTER — Encounter: Payer: Self-pay | Admitting: Family Medicine

## 2018-06-30 ENCOUNTER — Encounter: Payer: Self-pay | Admitting: Physician Assistant

## 2018-06-30 NOTE — Progress Notes (Signed)
Dr. Sallyanne Kuster sent message this weekend requesting a referral/appt be arranged with him for this patient, [name], for eval of hyperlipidemia. I have sent a message to our office's scheduling team requesting a follow-up appointment, and our office will call the patient with this information.  Chenee Munns PA-C

## 2018-07-02 ENCOUNTER — Telehealth: Payer: Self-pay | Admitting: *Deleted

## 2018-07-02 NOTE — Telephone Encounter (Signed)
Left message for patient to call and schedule New Patient appointment with Dr. Sallyanne Kuster for hyperlipidemia

## 2018-07-09 ENCOUNTER — Other Ambulatory Visit: Payer: Medicare Other

## 2018-07-10 ENCOUNTER — Other Ambulatory Visit: Payer: Medicare Other

## 2018-07-11 ENCOUNTER — Ambulatory Visit
Admission: RE | Admit: 2018-07-11 | Discharge: 2018-07-11 | Disposition: A | Discharge status: Home / Self Care | Payer: Medicare Other | Source: Ambulatory Visit | Attending: Family Medicine | Admitting: Family Medicine

## 2018-07-11 DIAGNOSIS — M48061 Spinal stenosis, lumbar region without neurogenic claudication: Secondary | ICD-10-CM | POA: Diagnosis not present

## 2018-07-11 DIAGNOSIS — M25552 Pain in left hip: Secondary | ICD-10-CM

## 2018-07-13 ENCOUNTER — Encounter: Payer: Self-pay | Admitting: Family Medicine

## 2018-07-15 NOTE — Progress Notes (Signed)
[facility] [place], [place] [address] Phone: [phone] Subjective:    I [name] am serving as a Education administrator for Dr. Hulan Saas.   CC: Back pain follow-up  PJA:[id number]  [name] is a 72 y.o. female coming in with complaint of hip pain. Here for MRI results. Hip is also painful. Concerned about Kidney. Has been taking medications.    Patient was seen by me and was having a left lumbar radiculopathy and left hip pain.  States that the medications meloxicam and gabapentin has been helping a little bit.  Patient was sent for an MRI.  MRI was independently visualized by me showing severe spinal stenosis at L4-L5 and left nerve root impingement that corresponds with patient's symptoms.  It also said that patient did have a potential hemorrhagic cyst noted in the right kidney.      Past Medical History:  Diagnosis Date  . Adenomatous colon polyp   . Arthritis   . Cataract   . Heart murmur    MVP  . Hyperlipidemia   . Hypertension   . Thyroid disease    Past Surgical History:  Procedure Laterality Date  . BREAST EXCISIONAL BIOPSY Right 1983  . COLONOSCOPY W/ POLYPECTOMY  2003,2006,2013   [name]  . DILATION AND CURETTAGE OF UTERUS     X2  . NEPHRECTOMY  2010   Left for 9.8cm benign complex cyst  . THYROIDECTOMY  2000   [name]  . TONSILLECTOMY    . TOTAL ABDOMINAL HYSTERECTOMY W/ BILATERAL SALPINGOOPHORECTOMY  2003   [name] for dysfunctional menses   Social History   Socioeconomic History  . Marital status: Married    Spouse name: Not on file  . Number of children: 2  . Years of education: Not on file  . Highest education level: Not on file  Occupational History  . Occupation: Press photographer  Social Needs  . Financial resource strain: Not hard at all  . Food insecurity:    Worry: Never true    Inability: Never true  . Transportation needs:    Medical: No    Non-medical: No  Tobacco Use  . Smoking status:  Former Smoker    Packs/day: 0.50    Years: 4.00    Pack years: 2.00    Last attempt to quit: 08/29/1975    Years since quitting: 42.9  . Smokeless tobacco: Never Used  . Tobacco comment: smoked 1973-1977, up to 1/2 ppd  Substance and Sexual Activity  . Alcohol use: Yes    Alcohol/week: 0.0 standard drinks    Comment: minimally  1 every 6 months  . Drug use: No  . Sexual activity: Yes    Comment: HYST  Lifestyle  . Physical activity:    Days per week: 3 days    Minutes per session: 40 min  . Stress: To some extent  Relationships  . Social connections:    Talks on phone: More than three times a week    Gets together: More than three times a week    Attends religious service: 1 to 4 times per year    Active member of club or organization: Yes    Attends meetings of clubs or organizations: 1 to 4 times per year    Relationship status: Married  Other Topics Concern  . Not on file  Social History Narrative   Fun/Hobbies: Grandkids   Allergies  Allergen Reactions  . Codeine Sulfate     "  made me jumpy"  . Erythromycin     nausea   Family History  Problem Relation Age of Onset  . Transient ischemic attack Father        in 19s  . Coronary artery disease Father        stents @ 61  . Nephrolithiasis Father   . Hyperlipidemia Mother   . Transient ischemic attack Mother 18  . Thyroid disease Mother        hyperthyroid  . Urolithiasis Sister   . Diabetes Neg Hx     Current Outpatient Medications (Endocrine & Metabolic):  .  levothyroxine (SYNTHROID, LEVOTHROID) 100 MCG tablet, TAKE 1 TABLET BY MOUTH EVERY DAY EXECPT 1 AND 1/2 TABLETS ON WEDNESDAYS  Current Outpatient Medications (Cardiovascular):  .  amLODipine (NORVASC) 5 MG tablet, TAKE 1 TABLET (5 MG TOTAL) BY MOUTH DAILY. MUST KEEP APPT W/NEW PROVIDER FOR FUTURE REFILLS .  benazepril (LOTENSIN) 20 MG tablet, TAKE 1 AND 1/2 TABLETS BY MOUTH DAILY. .  metoprolol succinate (TOPROL-XL) 50 MG 24 hr tablet, TAKE 1 TABLET BY  MOUTH EVERY DAY .  rosuvastatin (CRESTOR) 10 MG tablet, Take 1 tablet (10 mg total) by mouth 3 (three) times a week. Monday, Wednesday, and Friday   Current Outpatient Medications (Analgesics):  .  acetaminophen (TYLENOL ARTHRITIS PAIN) 650 MG CR tablet, Take 650 mg by mouth 4 (four) times daily as needed.  [name]  aspirin 81 MG tablet, Take 81 mg by mouth daily.   .  meloxicam (MOBIC) 7.5 MG tablet, Take 1 tablet (7.5 mg total) by mouth daily.   Current Outpatient Medications (Other):  [name]  ALPRAZolam (XANAX) 0.5 MG tablet, TAKE 1 TABLET BY MOUTH AT BEDTIME AS NEEDED FOR ANXIETY OR SLEEP .  Calcium Carbonate-Vitamin D (CALTRATE 600+D PO), Take by mouth daily.   [name]  gabapentin (NEURONTIN) 100 MG capsule, Take 2 capsules (200 mg total) by mouth at bedtime. .  Misc Natural Products (TART CHERRY ADVANCED PO), Take by mouth. .  Multiple Vitamins-Minerals (CENTRUM PO), Take by mouth daily.   .  potassium citrate (UROCIT-K 10) 10 MEQ (1080 MG) SR tablet, Take 10 mEq by mouth daily.     Past medical history, social, surgical and family history all reviewed in electronic medical record.  No pertanent information unless stated regarding to the chief complaint.   Review of Systems:  No headache, visual changes, nausea, vomiting, diarrhea, constipation, dizziness, abdominal pain, skin rash, fevers, chills, night sweats, weight loss, swollen lymph nodes, body aches, joint swelling, muscle aches, chest pain, shortness of breath, mood changes.   Objective  There were no vitals taken for this visit. Systems examined below as of    General: No apparent distress alert and oriented x3 mood and affect normal, dressed appropriately.  HEENT: Pupils equal, extraocular movements intact  Respiratory: Patient's speak in full sentences and does not appear short of breath  Cardiovascular: No lower extremity edema, non tender, no erythema  Skin: Warm dry intact with no signs of infection or rash on extremities or on  axial skeleton.  Abdomen: Soft nontender  Neuro: Cranial nerves II through XII are intact, neurovascularly intact in all extremities with 2+ DTRs and 2+ pulses.  Lymph: No lymphadenopathy of posterior or anterior cervical chain or axillae bilaterally.  Gait normal with good balance and coordination.  MSK:  Non tender with full range of motion and good stability and symmetric strength and tone of shoulders, elbows, wrist, hip, knee and ankles bilaterally.  Back exam  shows some mild loss of lordosis.  Still tender to palpation in the paraspinal musculature in the lumbar spine left greater than right.  Tenderness over the left hip noted as well.  Positive straight leg test on the left side.  Worsening pain with extension of the back.  Patient has 4+ out of 5 strength to the lower extremity on the left side compared to the right.  Deep tendon reflexes are intact and neurovascular intact distally   Impression and Recommendations:      The above documentation has been reviewed and is accurate and complete Jaclyn Pulley, DO       Note: This dictation was prepared with Dragon dictation along with smaller phrase technology. Any transcriptional errors that result from this process are unintentional.

## 2018-07-16 ENCOUNTER — Ambulatory Visit: Payer: Medicare Other | Admitting: Family Medicine

## 2018-07-16 ENCOUNTER — Encounter: Payer: Self-pay | Admitting: Family Medicine

## 2018-07-16 DIAGNOSIS — M48062 Spinal stenosis, lumbar region with neurogenic claudication: Secondary | ICD-10-CM | POA: Diagnosis not present

## 2018-07-16 DIAGNOSIS — N281 Cyst of kidney, acquired: Secondary | ICD-10-CM | POA: Insufficient documentation

## 2018-07-16 DIAGNOSIS — M48061 Spinal stenosis, lumbar region without neurogenic claudication: Secondary | ICD-10-CM | POA: Insufficient documentation

## 2018-07-16 NOTE — Assessment & Plan Note (Signed)
Patient does have a renal cyst.  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  At this point I will send the MRI to her oncologist but I do not feel that there is other significant findings that make me be as concerned.  We will see if they would like to change any other management.

## 2018-07-16 NOTE — Assessment & Plan Note (Signed)
Severe lumbar spinal stenosis.  Patient has had the MRI confirming this.  Seems to be at L4-L5.  We discussed the prognosis of this, and treatment options including medications, formal physical therapy, home exercises, icing and other modalities.  We also discussed the possibility of epidurals and surgical intervention.  Patient states

## 2018-07-16 NOTE — Patient Instructions (Signed)
Good to see you  Epidural is ordered and I hope it helps quickly  Call them at [phone] to schedule.  Once you know when that will be make an appointment with me 3 weeks later  I will send the MRI to your oncologist to have him weigh in.  See you soon and happy [place] day!

## 2018-07-17 ENCOUNTER — Other Ambulatory Visit: Payer: Self-pay | Admitting: *Deleted

## 2018-07-17 ENCOUNTER — Other Ambulatory Visit: Payer: Self-pay | Admitting: Family Medicine

## 2018-07-17 DIAGNOSIS — N281 Cyst of kidney, acquired: Secondary | ICD-10-CM

## 2018-07-22 ENCOUNTER — Telehealth: Payer: Self-pay | Admitting: *Deleted

## 2018-07-22 MED ORDER — ALPRAZOLAM 0.25 MG PO TABS: 0.5000 mg | ORAL_TABLET | Freq: Every evening | ORAL | 0 refills | Status: DC | PRN

## 2018-07-22 NOTE — Telephone Encounter (Signed)
Left detailed message informing pt of below change in medication.

## 2018-07-22 NOTE — Telephone Encounter (Signed)
Alprazolam 0.5 mg is on back order. Pharmacy is requesting new rx for a different dose. Please advise.   [place] Controlled Database Checked Last filled: 06/20/18 # 30 of 0.5 mg  LOV w/you: 05/17/18 Next appt w/you: 08/16/18

## 2018-07-22 NOTE — Telephone Encounter (Signed)
Alprazolam 0.25mg  - 2 tabs at bedtime for sleep/anxiety Rx sent

## 2018-07-30 ENCOUNTER — Ambulatory Visit
Admission: RE | Admit: 2018-07-30 | Discharge: 2018-07-30 | Disposition: A | Discharge status: Home / Self Care | Payer: Medicare Other | Source: Ambulatory Visit | Attending: Family Medicine | Admitting: Family Medicine

## 2018-07-30 ENCOUNTER — Ambulatory Visit: Payer: Medicare Other

## 2018-07-30 DIAGNOSIS — M48062 Spinal stenosis, lumbar region with neurogenic claudication: Secondary | ICD-10-CM | POA: Diagnosis not present

## 2018-07-30 MED ORDER — METHYLPREDNISOLONE ACETATE 40 MG/ML INJ SUSP (RADIOLOG
120.0000 mg | Freq: Once | INTRAMUSCULAR | Status: AC
  Administered 2018-07-30: 120 mg via EPIDURAL

## 2018-07-30 MED ORDER — IOPAMIDOL (ISOVUE-M 200) INJECTION 41%
1.0000 mL | Freq: Once | INTRAMUSCULAR | Status: AC
  Administered 2018-07-30: 1 mL via EPIDURAL

## 2018-07-30 NOTE — Discharge Instructions (Signed)

## 2018-07-31 ENCOUNTER — Other Ambulatory Visit: Payer: Self-pay | Admitting: Nurse Practitioner

## 2018-07-31 NOTE — Telephone Encounter (Signed)
Copied from [place] [id number]. Topic: Quick Communication - See Telephone Encounter >> Jul 31, 2018  4:47 PM Percell Belt A wrote: CRM for notification. See Telephone encounter for: 07/31/18.  Pt called in and would like to know if the ALPRAZolam Duanne Moron) 0.25 MG tablet [[id number] can be sent to [facility] on [address]  [facility] is out of them and they are on back order.  She stated she has been out for 2 weeks

## 2018-08-01 MED ORDER — ALPRAZOLAM 0.25 MG PO TABS: 0.5000 mg | ORAL_TABLET | Freq: Every evening | ORAL | 0 refills | Status: DC | PRN

## 2018-08-01 NOTE — Telephone Encounter (Signed)
[place] Controlled Database Checked Last filled: 06/20/18  0.5 mg # 30  LOV w/you: 05/17/18 Next appt w/you: 08/16/18

## 2018-08-06 ENCOUNTER — Encounter: Payer: Self-pay | Admitting: Family Medicine

## 2018-08-06 ENCOUNTER — Ambulatory Visit
Admission: RE | Admit: 2018-08-06 | Discharge: 2018-08-06 | Disposition: A | Discharge status: Home / Self Care | Payer: Medicare Other | Source: Ambulatory Visit | Attending: Nurse Practitioner | Admitting: Nurse Practitioner

## 2018-08-06 ENCOUNTER — Ambulatory Visit
Admission: RE | Admit: 2018-08-06 | Discharge: 2018-08-06 | Disposition: A | Discharge status: Home / Self Care | Payer: Medicare Other | Source: Ambulatory Visit | Attending: Family Medicine | Admitting: Family Medicine

## 2018-08-06 DIAGNOSIS — Z1231 Encounter for screening mammogram for malignant neoplasm of breast: Secondary | ICD-10-CM

## 2018-08-06 DIAGNOSIS — N281 Cyst of kidney, acquired: Secondary | ICD-10-CM | POA: Diagnosis not present

## 2018-08-06 MED ORDER — GADOBENATE DIMEGLUMINE 529 MG/ML IV SOLN
13.0000 mL | Freq: Once | INTRAVENOUS | Status: AC | PRN
  Administered 2018-08-06: 13 mL via INTRAVENOUS

## 2018-08-08 ENCOUNTER — Ambulatory Visit: Payer: Medicare Other | Admitting: Cardiovascular Disease

## 2018-08-15 ENCOUNTER — Other Ambulatory Visit: Payer: Self-pay | Admitting: Nurse Practitioner

## 2018-08-15 DIAGNOSIS — E890 Postprocedural hypothyroidism: Secondary | ICD-10-CM

## 2018-08-16 ENCOUNTER — Ambulatory Visit: Payer: Medicare Other | Admitting: Nurse Practitioner

## 2018-08-27 ENCOUNTER — Encounter: Payer: Self-pay | Admitting: Obstetrics & Gynecology

## 2018-08-27 ENCOUNTER — Ambulatory Visit (INDEPENDENT_AMBULATORY_CARE_PROVIDER_SITE_OTHER): Payer: Medicare Other | Admitting: Obstetrics & Gynecology

## 2018-08-27 VITALS — BP 138/88 | Ht 65.0 in | Wt 155.0 lb

## 2018-08-27 DIAGNOSIS — Z90711 Acquired absence of uterus with remaining cervical stump: Secondary | ICD-10-CM | POA: Diagnosis not present

## 2018-08-27 DIAGNOSIS — Z124 Encounter for screening for malignant neoplasm of cervix: Secondary | ICD-10-CM | POA: Diagnosis not present

## 2018-08-27 DIAGNOSIS — M8589 Other specified disorders of bone density and structure, multiple sites: Secondary | ICD-10-CM | POA: Diagnosis not present

## 2018-08-27 DIAGNOSIS — Z01419 Encounter for gynecological examination (general) (routine) without abnormal findings: Secondary | ICD-10-CM | POA: Diagnosis not present

## 2018-08-27 DIAGNOSIS — Z780 Asymptomatic menopausal state: Secondary | ICD-10-CM | POA: Diagnosis not present

## 2018-08-27 NOTE — Progress Notes (Signed)
[name] 1946-02-03 [id number]   History:    72 y.o. G2P2L2 Married.  Son lives at home with them/his 2 children coming on weekends.  RP:  Established patient presenting for annual gyn exam   HPI: Status post supracervical hysterectomy with bilateral salpingo-oophorectomy.  Menopause, well on no hormone replacement therapy.  No postmenopausal bleeding.  No pelvic pain.  Complains of dryness and pain with intercourse.  Urine and bowel movements normal.  Breasts normal.  Body mass index 25.79.  Recent injection of corticosteroids in the left hip has helped.  Health labs with family physician.  Due for colonoscopy.    Past medical history,surgical history, family history and social history were all reviewed and documented in the EPIC chart.  Gynecologic History No LMP recorded. Patient has had a hysterectomy. Contraception: status post hysterectomy Last Pap: 09/2013. Results were: Negative/HPV HR neg Last mammogram: 07/2018. Results were: Negative Bone Density: 02/2018 Osteopenia Colonoscopy: 2013.  5 yr schedule, will schedule now.  Obstetric History OB History  Gravida Para Term Preterm AB Living  2 2 2     2   SAB TAB Ectopic Multiple Live Births          2    # Outcome Date GA Lbr Len/2nd Weight Sex Delivery Anes PTL Lv  2 Term     M Vag-Spont  N LIV  1 Term     F Vag-Spont  N LIV     ROS: A ROS was performed and pertinent positives and negatives are included in the history.  GENERAL: No fevers or chills. HEENT: No change in vision, no earache, sore throat or sinus congestion. NECK: No pain or stiffness. CARDIOVASCULAR: No chest pain or pressure. No palpitations. PULMONARY: No shortness of breath, cough or wheeze. GASTROINTESTINAL: No abdominal pain, nausea, vomiting or diarrhea, melena or bright red blood per rectum. GENITOURINARY: No urinary frequency, urgency, hesitancy or dysuria. MUSCULOSKELETAL: No joint or muscle pain, no back pain, no recent trauma. DERMATOLOGIC: No  rash, no itching, no lesions. ENDOCRINE: No polyuria, polydipsia, no heat or cold intolerance. No recent change in weight. HEMATOLOGICAL: No anemia or easy bruising or bleeding. NEUROLOGIC: No headache, seizures, numbness, tingling or weakness. PSYCHIATRIC: No depression, no loss of interest in normal activity or change in sleep pattern.     Exam:   BP 138/88   Ht 5\' 5"  (1.651 m)   Wt 155 lb (70.3 kg)   BMI 25.79 kg/m   Body mass index is 25.79 kg/m.  General appearance : Well developed well nourished female. No acute distress HEENT: Eyes: no retinal hemorrhage or exudates,  Neck supple, trachea midline, no carotid bruits, no thyroidmegaly Lungs: Clear to auscultation, no rhonchi or wheezes, or rib retractions  Heart: Regular rate and rhythm, no murmurs or gallops Breast:Examined in sitting and supine position were symmetrical in appearance, no palpable masses or tenderness,  no skin retraction, no nipple inversion, no nipple discharge, no skin discoloration, no axillary or supraclavicular lymphadenopathy Abdomen: no palpable masses or tenderness, no rebound or guarding Extremities: no edema or skin discoloration or tenderness  Pelvic: Vulva: Normal             Vagina: No gross lesions or discharge  Cervix: No gross lesions or discharge.  Pap reflex done  Uterus  Absent (Supracervical hysterectomy)  Adnexa  Without masses or tenderness  Anus: Normal   Assessment/Plan:  72 y.o. female for annual exam   1. Encounter for routine gynecological examination with Papanicolaou  smear of cervix Gynecologic exam status post supracervical hysterectomy in menopause.  Pap reflex done today.  Breast exam normal.  Last screening mammogram December 2019 was negative.  Will schedule screening colonoscopy.  Health labs with family physician.  Good body mass index at 25.79.  Continue with fitness and healthy nutrition.  2. History of hysterectomy, supracervical abdominal (subtotal)  3.  Postmenopausal Well on no hormone replacement therapy.  No postmenopausal bleeding.  4. Osteopenia of multiple sites Osteopenia on bone density in July 2019.  Vitamin D supplements, calcium intake of 1500 mg/day including nutritional and supplemental.  Continue with weightbearing physical activity on a regular basis.  Princess Bruins MD, 10:08 AM 08/27/2018

## 2018-08-28 ENCOUNTER — Encounter: Payer: Self-pay | Admitting: Obstetrics & Gynecology

## 2018-08-28 NOTE — Patient Instructions (Signed)
1. Encounter for routine gynecological examination with Papanicolaou smear of cervix Gynecologic exam status post supracervical hysterectomy in menopause.  Pap reflex done today.  Breast exam normal.  Last screening mammogram December 2019 was negative.  Will schedule screening colonoscopy.  Health labs with family physician.  Good body mass index at 25.79.  Continue with fitness and healthy nutrition.  2. History of hysterectomy, supracervical abdominal (subtotal)  3. Postmenopausal Well on no hormone replacement therapy.  No postmenopausal bleeding.  4. Osteopenia of multiple sites Osteopenia on bone density in July 2019.  Vitamin D supplements, calcium intake of 1500 mg/day including nutritional and supplemental.  Continue with weightbearing physical activity on a regular basis.  [name], it was a pleasure seeing you today!  I will inform you of your results as soon as they are available.

## 2018-08-29 LAB — PAP IG W/ RFLX HPV ASCU

## 2018-09-15 ENCOUNTER — Other Ambulatory Visit: Payer: Self-pay | Admitting: Nurse Practitioner

## 2018-09-15 ENCOUNTER — Other Ambulatory Visit: Payer: Self-pay | Admitting: Family Medicine

## 2018-09-15 DIAGNOSIS — E782 Mixed hyperlipidemia: Secondary | ICD-10-CM

## 2018-09-15 DIAGNOSIS — I10 Essential (primary) hypertension: Secondary | ICD-10-CM

## 2018-09-20 ENCOUNTER — Ambulatory Visit (INDEPENDENT_AMBULATORY_CARE_PROVIDER_SITE_OTHER): Payer: Medicare Other | Admitting: Nurse Practitioner

## 2018-09-20 ENCOUNTER — Other Ambulatory Visit (INDEPENDENT_AMBULATORY_CARE_PROVIDER_SITE_OTHER): Payer: Medicare Other

## 2018-09-20 ENCOUNTER — Encounter: Payer: Self-pay | Admitting: Nurse Practitioner

## 2018-09-20 VITALS — BP 160/85 | HR 86 | Ht 65.0 in | Wt 156.0 lb

## 2018-09-20 DIAGNOSIS — F419 Anxiety disorder, unspecified: Secondary | ICD-10-CM

## 2018-09-20 DIAGNOSIS — E890 Postprocedural hypothyroidism: Secondary | ICD-10-CM

## 2018-09-20 DIAGNOSIS — F329 Major depressive disorder, single episode, unspecified: Secondary | ICD-10-CM

## 2018-09-20 DIAGNOSIS — Z1211 Encounter for screening for malignant neoplasm of colon: Secondary | ICD-10-CM | POA: Diagnosis not present

## 2018-09-20 DIAGNOSIS — I10 Essential (primary) hypertension: Secondary | ICD-10-CM

## 2018-09-20 DIAGNOSIS — E782 Mixed hyperlipidemia: Secondary | ICD-10-CM | POA: Diagnosis not present

## 2018-09-20 LAB — LIPID PANEL
Cholesterol: 240 mg/dL — ABNORMAL HIGH (ref 0–200)
HDL: 47 mg/dL (ref >39.00)
NonHDL: 192.79
Total CHOL/HDL Ratio: 5
Triglycerides: 375 mg/dL — ABNORMAL HIGH (ref 0.0–149.0)
VLDL: 75 mg/dL — ABNORMAL HIGH (ref 0.0–40.0)

## 2018-09-20 LAB — COMPREHENSIVE METABOLIC PANEL
ALT: 18 U/L (ref 0–35)
AST: 20 U/L (ref 0–37)
Albumin: 4.4 g/dL (ref 3.5–5.2)
Alkaline Phosphatase: 94 U/L (ref 39–117)
BUN: 20 mg/dL (ref 6–23)
CO2: 30 mEq/L (ref 19–32)
Calcium: 9.7 mg/dL (ref 8.4–10.5)
Chloride: 105 mEq/L (ref 96–112)
Creatinine, Ser: 0.82 mg/dL (ref 0.40–1.20)
GFR: 68.4 mL/min (ref >60.00)
Glucose, Bld: 89 mg/dL (ref 70–99)
Potassium: 4.6 mEq/L (ref 3.5–5.1)
Sodium: 142 mEq/L (ref 135–145)
Total Bilirubin: 0.4 mg/dL (ref 0.2–1.2)
Total Protein: 7.3 g/dL (ref 6.0–8.3)

## 2018-09-20 LAB — LDL CHOLESTEROL, DIRECT: Direct LDL: 126 mg/dL

## 2018-09-20 LAB — TSH: TSH: 0.1 u[IU]/mL — AB (ref 0.35–4.50)

## 2018-09-20 MED ORDER — LEVOTHYROXINE SODIUM 100 MCG PO TABS: ORAL_TABLET | ORAL | 2 refills | Status: DC

## 2018-09-20 MED ORDER — ALPRAZOLAM 0.25 MG PO TABS: 0.5000 mg | ORAL_TABLET | Freq: Every evening | ORAL | 0 refills | Status: DC | PRN

## 2018-09-20 NOTE — Assessment & Plan Note (Signed)
BP readings continue to be elevated in office with normal readings at home Continue current medications continue to monitor home BP readings and F/U for readings >140/90

## 2018-09-20 NOTE — Assessment & Plan Note (Signed)
Stable Continue xanax prn bedtime F/u for new, worsening symptoms - ALPRAZolam (XANAX) 0.25 MG tablet; Take 2 tablets (0.5 mg total) by mouth at bedtime as needed for anxiety.  Dispense: 60 tablet; Refill: 0

## 2018-09-20 NOTE — Progress Notes (Signed)
[name] is a 74 y.o. female with the following history as recorded in EpicCare:  Patient Active Problem List   Diagnosis Date Noted  . Lumbar spinal stenosis 07/16/2018  . Renal cyst 07/16/2018  . Left lumbar radiculopathy 06/24/2018  . Left hip pain 10/25/2017  . Anxiety and depression 05/23/2017  . Hyperlipidemia 04/21/2016  . Routine general medical examination at a health care facility 01/05/2016  . Medicare annual wellness visit, subsequent 01/05/2016  . Allergic rhinitis 12/29/2015  . Vaginal atrophy 10/22/2013  . DJD (degenerative joint disease) of knee 10/18/2012  . NEPHRECTOMY, HX OF 05/28/2009  . Hypothyroidism, postsurgical 07/18/2007  . HYPERLIPIDEMIA 07/18/2007  . Essential hypertension 07/18/2007  . OSTEOPENIA 07/15/2007  . History of colonic polyps 07/15/2007    Current Outpatient Medications  Medication Sig Dispense Refill  . acetaminophen (TYLENOL ARTHRITIS PAIN) 650 MG CR tablet Take 650 mg by mouth 4 (four) times daily as needed.     . ALPRAZolam (XANAX) 0.25 MG tablet Take 2 tablets (0.5 mg total) by mouth at bedtime as needed for anxiety. 60 tablet 0  . amLODipine (NORVASC) 5 MG tablet TAKE 1 TABLET (5 MG TOTAL) BY MOUTH DAILY. MUST KEEP APPT W/NEW PROVIDER FOR FUTURE REFILLS 90 tablet 1  . aspirin 81 MG tablet Take 81 mg by mouth daily.      . benazepril (LOTENSIN) 20 MG tablet TAKE 1 AND 1/2 TABLETS DAILY BY MOUTH 135 tablet 0  . Calcium Carbonate-Vitamin D (CALTRATE 600+D PO) Take by mouth daily.      [name] gabapentin (NEURONTIN) 100 MG capsule TAKE 2 CAPSULES (200 MG TOTAL) BY MOUTH AT BEDTIME. 180 capsule 2  . levothyroxine (SYNTHROID, LEVOTHROID) 100 MCG tablet Take 1 tablet by mouth daily. 90 tablet 2  . meloxicam (MOBIC) 7.5 MG tablet TAKE 1 TABLET BY MOUTH EVERY DAY 90 tablet 0  . metoprolol succinate (TOPROL-XL) 50 MG 24 hr tablet TAKE 1 TABLET BY MOUTH EVERY DAY 90 tablet 0  . Misc Natural Products (TART CHERRY ADVANCED PO) Take by mouth.    .  Multiple Vitamins-Minerals (CENTRUM PO) Take by mouth daily.      . potassium citrate (UROCIT-K 10) 10 MEQ (1080 MG) SR tablet Take 10 mEq by mouth daily.     . rosuvastatin (CRESTOR) 10 MG tablet TAKE 1 TABLET BY MOUTH EVERY DAY 90 tablet 0   No current facility-administered medications for this visit.     Allergies: Codeine sulfate and Erythromycin  Past Medical History:  Diagnosis Date  . Adenomatous colon polyp   . Arthritis   . Cataract   . Heart murmur    MVP  . Hyperlipidemia   . Hypertension   . Thyroid disease     Past Surgical History:  Procedure Laterality Date  . BREAST EXCISIONAL BIOPSY Right 1983  . COLONOSCOPY W/ POLYPECTOMY  2003,2006,2013   [name]  . DILATION AND CURETTAGE OF UTERUS     X2  . NEPHRECTOMY  2010   Left for 9.8cm benign complex cyst  . THYROIDECTOMY  2000   [name]  . TONSILLECTOMY    . TOTAL ABDOMINAL HYSTERECTOMY W/ BILATERAL SALPINGOOPHORECTOMY  2003   [name] for dysfunctional menses    Family History  Problem Relation Age of Onset  . Transient ischemic attack Father        in 21s  . Coronary artery disease Father        stents @ 88  . Nephrolithiasis Father   . Hyperlipidemia Mother   .  Transient ischemic attack Mother 28  . Thyroid disease Mother        hyperthyroid  . Urolithiasis Sister   . Diabetes Neg Hx     Social History   Tobacco Use  . Smoking status: Former Smoker    Packs/day: 0.50    Years: 4.00    Pack years: 2.00    Last attempt to quit: 08/29/1975    Years since quitting: 43.0  . Smokeless tobacco: Never Used  . Tobacco comment: smoked 1973-1977, up to 1/2 ppd  Substance Use Topics  . Alcohol use: Yes    Alcohol/week: 0.0 standard drinks    Comment: minimally  1 every 6 months     Subjective:  [name] is here today for routine follow up of hypothyroidism and hypertension. Since our last visit, she has set up and appointment to see cardiologist at the lipid clinic and also had annual womens check  with PAP at GYN office. She is requesting xanax refill, taking prn at bedtime for sleep, without noted adverse effects and helps her sleep better, feel less anxious during the day.  Hypertension -maintained on amlodipine 5, benazepril30, metoprolol succinate 50daily. Reports daily medication compliance without adverse medication effects. She does check BP readings regularly at home, readings have remained in 130s/70s since our last visit Denies headaches, vision changes, chest pain, shortness of breath, edema.  BP Readings from Last 3 Encounters:  09/20/18 (!) 160/85  08/27/18 138/88  07/30/18 (!) 170/73    Hypothryoidism-maintained on synthroid 100 mcg daily, reports daily medication compliance without noted adverse effects No fatigue, palpitations, appetite/bowel changes.  Lab Results  Component Value Date   TSH 3.31 01/30/2018   ROS- See HPI  Objective:  Vitals:   09/20/18 1301 09/20/18 1339  BP: (!) 176/90 (!) 160/85  Pulse: 86   SpO2: 97%   Weight: 156 lb (70.8 kg)   Height: 5\' 5"  (1.651 m)     General: Well developed, well nourished, in no acute distress  Skin : Warm and dry.  Head: Normocephalic and atraumatic  Eyes: Sclera and conjunctiva clear; pupils round and reactive to light; extraocular movements intact  Oropharynx: Pink, supple. No suspicious lesions  Neck: Supple without thyromegaly Lungs: Respirations unlabored; clear to auscultation bilaterally without wheeze, rales, rhonchi  CVS exam: normal rate and regular rhythm, S1 and S2 normal.  Extremities: No edema, cyanosis, clubbing  Vessels: Symmetric bilaterally  Neurologic: Alert and oriented; speech intact; face symmetrical; moves all extremities well; CNII-XII intact without focal deficit  Psychiatric: Normal mood and affect.   Assessment:  1. Essential hypertension   2. Hypothyroidism, postsurgical   3. Anxiety and depression   4. Screening for colon cancer     Plan:   Screening for colon  cancer- Ambulatory referral to Gastroenterology  Return in about 6 months (around 03/21/2019) for CPE.  Orders Placed This Encounter  Procedures  . TSH    Standing Status:   Future    Standing Expiration Date:   09/21/2019  . Ambulatory referral to Gastroenterology    Referral Priority:   Routine    Referral Type:   Consultation    Referral Reason:   Specialty Services Required    Number of Visits Requested:   1    Requested Prescriptions   Signed Prescriptions Disp Refills  . ALPRAZolam (XANAX) 0.25 MG tablet 60 tablet 0    Sig: Take 2 tablets (0.5 mg total) by mouth at bedtime as needed for anxiety.  [name] levothyroxine (  SYNTHROID, LEVOTHROID) 100 MCG tablet 90 tablet 2    Sig: Take 1 tablet by mouth daily.

## 2018-09-20 NOTE — Assessment & Plan Note (Signed)
Continue synthroid Update TSH - levothyroxine (SYNTHROID, LEVOTHROID) 100 MCG tablet; Take 1 tablet by mouth daily.  Dispense: 90 tablet; Refill: 2 - TSH; Future

## 2018-09-20 NOTE — Patient Instructions (Signed)
Head downstairs for labs  I will see you back in 6 months, or sooner if needed

## 2018-09-27 ENCOUNTER — Other Ambulatory Visit: Payer: Self-pay | Admitting: Nurse Practitioner

## 2018-09-27 DIAGNOSIS — E890 Postprocedural hypothyroidism: Secondary | ICD-10-CM

## 2018-09-27 MED ORDER — LEVOTHYROXINE SODIUM 88 MCG PO TABS: ORAL_TABLET | ORAL | 2 refills | Status: DC

## 2018-10-02 ENCOUNTER — Ambulatory Visit: Payer: Medicare Other | Admitting: Internal Medicine

## 2018-10-23 ENCOUNTER — Encounter: Payer: Self-pay | Admitting: Gastroenterology

## 2018-10-25 ENCOUNTER — Encounter

## 2018-10-31 ENCOUNTER — Other Ambulatory Visit: Payer: Self-pay | Admitting: *Deleted

## 2018-10-31 MED ORDER — AMLODIPINE BESYLATE 5 MG PO TABS: 5.0000 mg | ORAL_TABLET | Freq: Every day | ORAL | 1 refills | Status: DC

## 2018-11-05 ENCOUNTER — Other Ambulatory Visit: Payer: Self-pay | Admitting: Nurse Practitioner

## 2018-11-05 DIAGNOSIS — F419 Anxiety disorder, unspecified: Principal | ICD-10-CM

## 2018-11-05 DIAGNOSIS — F329 Major depressive disorder, single episode, unspecified: Secondary | ICD-10-CM

## 2018-11-05 NOTE — Telephone Encounter (Signed)
[place] Controlled Database Checked Last filled: 09/20/18 # 60 LOV w/you: 09/20/18 Next appt w/you: 02/20/19

## 2018-11-12 ENCOUNTER — Telehealth: Payer: Self-pay | Admitting: *Deleted

## 2018-11-12 NOTE — Telephone Encounter (Signed)
patient is calling to reschedule office appointment for 11/13/18 with Dr Debara Pickett. Patient appt was for hyperlipidemia due to c-virus   Protocol will reschedule for later date

## 2018-11-13 ENCOUNTER — Ambulatory Visit: Payer: Medicare Other | Admitting: Internal Medicine

## 2018-11-15 DIAGNOSIS — N200 Calculus of kidney: Secondary | ICD-10-CM | POA: Diagnosis not present

## 2018-11-22 DIAGNOSIS — N200 Calculus of kidney: Secondary | ICD-10-CM | POA: Diagnosis not present

## 2018-11-29 ENCOUNTER — Encounter: Payer: Medicare Other | Admitting: Gastroenterology

## 2018-12-02 NOTE — Telephone Encounter (Signed)
   Primary Cardiologist:  Pixie Casino, MD   Patient contacted OFFICE wanted cancel appointment.  History reviewed.  No symptoms to suggest any unstable cardiac conditions.  Based on discussion, with current pandemic situation, we will be postponing this appointment for [name] with a plan for f/u in >12 wks or sooner if feasible/necessary.  If symptoms change, she has been instructed to contact our office.   Routing to C19 CANCEL pool for tracking   [name]  12/02/2018 4:47 PM         .

## 2018-12-11 ENCOUNTER — Telehealth: Payer: Self-pay | Admitting: *Deleted

## 2018-12-11 DIAGNOSIS — M5416 Radiculopathy, lumbar region: Secondary | ICD-10-CM

## 2018-12-11 NOTE — Telephone Encounter (Signed)
Pt called requesting epidural.  Order sent to [place].

## 2018-12-12 ENCOUNTER — Other Ambulatory Visit: Payer: Self-pay | Admitting: Family Medicine

## 2018-12-12 ENCOUNTER — Other Ambulatory Visit: Payer: Self-pay | Admitting: *Deleted

## 2018-12-12 DIAGNOSIS — I10 Essential (primary) hypertension: Secondary | ICD-10-CM

## 2018-12-12 DIAGNOSIS — E782 Mixed hyperlipidemia: Secondary | ICD-10-CM

## 2018-12-12 MED ORDER — BENAZEPRIL HCL 20 MG PO TABS: ORAL_TABLET | ORAL | 0 refills | Status: DC

## 2018-12-12 MED ORDER — ROSUVASTATIN CALCIUM 10 MG PO TABS: 10.0000 mg | ORAL_TABLET | Freq: Every day | ORAL | 0 refills | Status: DC

## 2018-12-12 MED ORDER — METOPROLOL SUCCINATE ER 50 MG PO TB24: 50.0000 mg | ORAL_TABLET | Freq: Every day | ORAL | 0 refills | Status: DC

## 2018-12-12 NOTE — Addendum Note (Signed)
Addended by: Cresenciano Lick on: 12/12/2018 10:27 AM   Modules accepted: Orders

## 2018-12-17 NOTE — Progress Notes (Signed)
[facility] [address] [place], [place] [address] Phone: [phone] Subjective:    Virtual Visit via Video Note  I connected with [name] on 12/17/18 at  2:00 PM EDT by a video enabled telemedicine application and verified that I am speaking with the correct person using two identifiers.   I discussed the limitations of evaluation and management by telemedicine and the availability of in person appointments. The patient expressed understanding and agreed to proceed.  Patient was in her home residence and I was in a work environment.  Had difficulty with the video aspect secondary to patient's phone.    I discussed the assessment and treatment plan with the patient. The patient was provided an opportunity to ask questions and all were answered. The patient agreed with the plan and demonstrated an understanding of the instructions.   The patient was advised to call back or seek an in-person evaluation if the symptoms worsen or if the condition fails to improve as anticipated.  I provided 26 minutes of face-to-face time during this encounter.   Lyndal Pulley, DO  More detailed note below  CC: Back pain follow-up  [id number]  [name] is a 73 y.o. female coming in with complaint of back pain.  Known spinal stenosis at L4-L5.  Responded well to epidural previously.  This was an December.  States that over the last month started having worsening pain again.  Unable to walk greater than 200 feet without significant discomfort and pain.  Patient states that seems to radiate down her legs.  Has noticed even some weakness recently.     Past Medical History:  Diagnosis Date  . Adenomatous colon polyp   . Arthritis   . Cataract   . Heart murmur    MVP  . Hyperlipidemia   . Hypertension   . Thyroid disease    Past Surgical History:  Procedure Laterality Date  . BREAST EXCISIONAL BIOPSY Right 1983  . COLONOSCOPY W/ POLYPECTOMY   2003,2006,2013   [name]  . DILATION AND CURETTAGE OF UTERUS     X2  . NEPHRECTOMY  2010   Left for 9.8cm benign complex cyst  . THYROIDECTOMY  2000   [name]  . TONSILLECTOMY    . TOTAL ABDOMINAL HYSTERECTOMY W/ BILATERAL SALPINGOOPHORECTOMY  2003   [name] for dysfunctional menses   Social History   Socioeconomic History  . Marital status: Married    Spouse name: Not on file  . Number of children: 2  . Years of education: Not on file  . Highest education level: Not on file  Occupational History  . Occupation: Press photographer  Social Needs  . Financial resource strain: Not hard at all  . Food insecurity:    Worry: Never true    Inability: Never true  . Transportation needs:    Medical: No    Non-medical: No  Tobacco Use  . Smoking status: Former Smoker    Packs/day: 0.50    Years: 4.00    Pack years: 2.00    Last attempt to quit: 08/29/1975    Years since quitting: 43.3  . Smokeless tobacco: Never Used  . Tobacco comment: smoked 1973-1977, up to 1/2 ppd  Substance and Sexual Activity  . Alcohol use: Yes    Alcohol/week: 0.0 standard drinks    Comment: minimally  1 every 6 months  . Drug use: No  . Sexual activity: Yes    Comment: HYST  Lifestyle  .  Physical activity:    Days per week: 3 days    Minutes per session: 40 min  . Stress: To some extent  Relationships  . Social connections:    Talks on phone: More than three times a week    Gets together: More than three times a week    Attends religious service: 1 to 4 times per year    Active member of club or organization: Yes    Attends meetings of clubs or organizations: 1 to 4 times per year    Relationship status: Married  Other Topics Concern  . Not on file  Social History Narrative   Fun/Hobbies: Grandkids   Allergies  Allergen Reactions  . Codeine Sulfate     "made me jumpy"  . Erythromycin     nausea   Family History  Problem Relation Age of Onset  . Transient ischemic attack Father        in 25s   . Coronary artery disease Father        stents @ 59  . Nephrolithiasis Father   . Hyperlipidemia Mother   . Transient ischemic attack Mother 78  . Thyroid disease Mother        hyperthyroid  . Urolithiasis Sister   . Diabetes Neg Hx     Current Outpatient Medications (Endocrine & Metabolic):  .  levothyroxine (SYNTHROID, LEVOTHROID) 88 MCG tablet, Take 1 tablet by mouth daily.  Current Outpatient Medications (Cardiovascular):  .  amLODipine (NORVASC) 5 MG tablet, Take 1 tablet (5 mg total) by mouth daily. .  benazepril (LOTENSIN) 20 MG tablet, TAKE 1 AND 1/2 TABLETS DAILY BY MOUTH .  metoprolol succinate (TOPROL-XL) 50 MG 24 hr tablet, Take 1 tablet (50 mg total) by mouth daily. Take with or immediately following a meal. .  rosuvastatin (CRESTOR) 10 MG tablet, Take 1 tablet (10 mg total) by mouth daily.   Current Outpatient Medications (Analgesics):  .  acetaminophen (TYLENOL ARTHRITIS PAIN) 650 MG CR tablet, Take 650 mg by mouth 4 (four) times daily as needed.  [name]  aspirin 81 MG tablet, Take 81 mg by mouth daily.   .  meloxicam (MOBIC) 7.5 MG tablet, TAKE 1 TABLET BY MOUTH EVERY DAY   Current Outpatient Medications (Other):  [name]  ALPRAZolam (XANAX) 0.25 MG tablet, TAKE 2 TABLETS BY MOUTH AT BEDTIME AS NEEDED FOR ANXIETY .  Calcium Carbonate-Vitamin D (CALTRATE 600+D PO), Take by mouth daily.   [name]  gabapentin (NEURONTIN) 100 MG capsule, TAKE 2 CAPSULES (200 MG TOTAL) BY MOUTH AT BEDTIME. [name]  Misc Natural Products (TART CHERRY ADVANCED PO), Take by mouth. .  Multiple Vitamins-Minerals (CENTRUM PO), Take by mouth daily.   .  potassium citrate (UROCIT-K 10) 10 MEQ (1080 MG) SR tablet, Take 10 mEq by mouth daily.     Past medical history, social, surgical and family history all reviewed in electronic medical record.  No pertanent information unless stated regarding to the chief complaint.   Review of Systems:  No headache, visual changes, nausea, vomiting, diarrhea, constipation,  dizziness, abdominal pain, skin rash, fevers, chills, night sweats, weight loss, swollen lymph nodes, body aches, joint swelling,, chest pain, shortness of breath, mood changes.  Positive muscle aches  Objective     General: No apparent distress alert and oriented x3 mood and affect normal, dressed appropriately.  HEENT: Pupils equal, extraocular movements intact  Respiratory: Patient's speak in full sentences and does not appear short of breath  \   Impression and Recommendations:  The above documentation has been reviewed and is accurate and complete Lyndal Pulley, DO       Note: This dictation was prepared with Dragon dictation along with smaller phrase technology. Any transcriptional errors that result from this process are unintentional.

## 2018-12-18 ENCOUNTER — Ambulatory Visit (INDEPENDENT_AMBULATORY_CARE_PROVIDER_SITE_OTHER): Payer: Medicare Other | Admitting: Family Medicine

## 2018-12-18 ENCOUNTER — Encounter: Payer: Self-pay | Admitting: Family Medicine

## 2018-12-18 DIAGNOSIS — M48062 Spinal stenosis, lumbar region with neurogenic claudication: Secondary | ICD-10-CM | POA: Diagnosis not present

## 2018-12-18 NOTE — Assessment & Plan Note (Signed)
Discussed with patient in great length.  Did respond well to an epidural previously.  We have ordered another one but unable to do it secondary to viral outbreak causing a delay in elective procedures.  We discussed the possibility of patient coming into the office setting for either piriformis injection or sacroiliac injection to see if this will help with some of the pain.  Encourage patient to take the gabapentin at 200 mg.  Patient has only been using 100 mg intermittently.  Patient will get back to [place] in 1 week to tell us how the increase in medication is doing.

## 2018-12-24 ENCOUNTER — Telehealth: Payer: Self-pay | Admitting: Family

## 2018-12-24 ENCOUNTER — Other Ambulatory Visit: Payer: Self-pay | Admitting: Family

## 2018-12-24 DIAGNOSIS — F419 Anxiety disorder, unspecified: Principal | ICD-10-CM

## 2018-12-24 DIAGNOSIS — F329 Major depressive disorder, single episode, unspecified: Secondary | ICD-10-CM

## 2018-12-24 DIAGNOSIS — F32A Depression, unspecified: Secondary | ICD-10-CM

## 2018-12-24 MED ORDER — ALPRAZOLAM 0.25 MG PO TABS: ORAL_TABLET | ORAL | 0 refills | Status: DC

## 2018-12-24 NOTE — Telephone Encounter (Signed)
I refilled her Xanax x 1 refill; she needs to schedule appt with Baldo Ash or me by end of June for further refills/ blood pressure check.

## 2018-12-25 NOTE — Telephone Encounter (Signed)
Left mess for patient to call back to schedule visit (possibly virtual if able) by end of June. CRM created.

## 2019-01-01 ENCOUNTER — Encounter: Payer: Self-pay | Admitting: Family Medicine

## 2019-01-13 ENCOUNTER — Telehealth: Payer: Self-pay | Admitting: *Deleted

## 2019-01-13 NOTE — Telephone Encounter (Signed)
[name] will call back on tomorrow and schedule her office visit.

## 2019-01-24 ENCOUNTER — Other Ambulatory Visit: Payer: Self-pay

## 2019-01-24 ENCOUNTER — Ambulatory Visit
Admission: RE | Admit: 2019-01-24 | Discharge: 2019-01-24 | Disposition: A | Discharge status: Home / Self Care | Payer: Medicare Other | Source: Ambulatory Visit | Attending: Family Medicine | Admitting: Family Medicine

## 2019-01-24 DIAGNOSIS — M5416 Radiculopathy, lumbar region: Secondary | ICD-10-CM

## 2019-01-24 DIAGNOSIS — M5116 Intervertebral disc disorders with radiculopathy, lumbar region: Secondary | ICD-10-CM | POA: Diagnosis not present

## 2019-01-24 MED ORDER — IOPAMIDOL (ISOVUE-M 200) INJECTION 41%
1.0000 mL | Freq: Once | INTRAMUSCULAR | Status: AC
  Administered 2019-01-24: 1 mL via EPIDURAL

## 2019-01-24 MED ORDER — METHYLPREDNISOLONE ACETATE 40 MG/ML INJ SUSP (RADIOLOG
120.0000 mg | Freq: Once | INTRAMUSCULAR | Status: AC
  Administered 2019-01-24: 14:00:00 120 mg via EPIDURAL

## 2019-01-24 NOTE — Discharge Instructions (Signed)

## 2019-01-31 ENCOUNTER — Encounter: Payer: Self-pay | Admitting: Family Medicine

## 2019-02-10 ENCOUNTER — Other Ambulatory Visit: Payer: Self-pay | Admitting: Internal Medicine

## 2019-02-10 DIAGNOSIS — E782 Mixed hyperlipidemia: Secondary | ICD-10-CM

## 2019-02-20 ENCOUNTER — Ambulatory Visit: Payer: Medicare Other

## 2019-02-20 ENCOUNTER — Encounter: Payer: Medicare Other | Admitting: Nurse Practitioner

## 2019-02-20 NOTE — Progress Notes (Addendum)
Subjective:   [name] is a 73 y.o. female who presents for Medicare Annual (Subsequent) preventive examination.  Review of Systems:   Cardiac Risk Factors include: dyslipidemia;hypertension Sleep patterns: feels rested on waking, gets up 1-2 times nightly to void and sleeps 7 hours nightly.    Home Safety/Smoke Alarms: Feels safe in home. Smoke alarms in place.  Living environment; residence and Firearm Safety: 2-story house. Lives with husband, no needs for DME, good support system Seat Belt Safety/Bike Helmet: Wears seat belt.     Objective:     Vitals: BP 130/84   Pulse 65   Ht 5\' 6"  (1.676 m)   Wt 158 lb (71.7 kg)   SpO2 96%   BMI 25.50 kg/m   Body mass index is 25.5 kg/m.  Advanced Directives 02/21/2019 02/15/2018 11/17/2015  Does Patient Have a Medical Advance Directive? No No Yes  Copy of [place] in Chart? - - Yes  Would patient like information on creating a medical advance directive? Yes (ED - Information included in AVS) Yes (ED - Information included in AVS) -    Tobacco Social History   Tobacco Use  Smoking Status Former Smoker  . Packs/day: 0.50  . Years: 4.00  . Pack years: 2.00  . Quit date: 08/29/1975  . Years since quitting: 43.5  Smokeless Tobacco Never Used  Tobacco Comment   smoked 1973-1977, up to 1/2 ppd     Counseling given: Not Answered Comment: smoked 1973-1977, up to 1/2 ppd  Past Medical History:  Diagnosis Date  . Adenomatous colon polyp   . Arthritis   . Cataract   . Heart murmur    MVP  . Hyperlipidemia   . Hypertension   . Thyroid disease    Past Surgical History:  Procedure Laterality Date  . BREAST EXCISIONAL BIOPSY Right 1983  . COLONOSCOPY W/ POLYPECTOMY  2003,2006,2013   [name]  . DILATION AND CURETTAGE OF UTERUS     X2  . NEPHRECTOMY  2010   Left for 9.8cm benign complex cyst  . THYROIDECTOMY  2000   [name]  . TONSILLECTOMY    . TOTAL ABDOMINAL HYSTERECTOMY W/ BILATERAL  SALPINGOOPHORECTOMY  2003   [name] for dysfunctional menses   Family History  Problem Relation Age of Onset  . Transient ischemic attack Father        in 19s  . Coronary artery disease Father        stents @ 66  . Nephrolithiasis Father   . Hyperlipidemia Mother   . Transient ischemic attack Mother 70  . Thyroid disease Mother        hyperthyroid  . Urolithiasis Sister   . Diabetes Neg Hx    Social History   Socioeconomic History  . Marital status: Married    Spouse name: Not on file  . Number of children: 2  . Years of education: Not on file  . Highest education level: Not on file  Occupational History  . Occupation: [employer]  . Financial resource strain: Not hard at all  . Food insecurity    Worry: Never true    Inability: Never true  . Transportation needs    Medical: No    Non-medical: No  Tobacco Use  . Smoking status: Former Smoker    Packs/day: 0.50    Years: 4.00    Pack years: 2.00    Quit date: 08/29/1975    Years since quitting: 43.5  .  Smokeless tobacco: Never Used  . Tobacco comment: smoked 1973-1977, up to 1/2 ppd  Substance and Sexual Activity  . Alcohol use: Yes    Alcohol/week: 0.0 standard drinks    Comment: minimally  1 every 6 months  . Drug use: No  . Sexual activity: Yes    Comment: HYST  Lifestyle  . Physical activity    Days per week: 0 days    Minutes per session: 0 min  . Stress: Only a little  Relationships  . Social connections    Talks on phone: More than three times a week    Gets together: More than three times a week    Attends religious service: 1 to 4 times per year    Active member of club or organization: Yes    Attends meetings of clubs or organizations: 1 to 4 times per year    Relationship status: Married  Other Topics Concern  . Not on file  Social History Narrative   Fun/Hobbies: Grandkids    Outpatient Encounter Medications as of 02/21/2019  Medication Sig  . acetaminophen (TYLENOL  ARTHRITIS PAIN) 650 MG CR tablet Take 650 mg by mouth 4 (four) times daily as needed.   [name] amLODipine (NORVASC) 5 MG tablet Take 1 tablet (5 mg total) by mouth daily.  [name] aspirin 81 MG tablet Take 81 mg by mouth daily.    . benazepril (LOTENSIN) 20 MG tablet TAKE 1 AND 1/2 TABLETS DAILY BY MOUTH  . Calcium Carbonate-Vitamin D (CALTRATE 600+D PO) Take by mouth daily.    [name] gabapentin (NEURONTIN) 100 MG capsule TAKE 2 CAPSULES (200 MG TOTAL) BY MOUTH AT BEDTIME.  [name] levothyroxine (SYNTHROID, LEVOTHROID) 88 MCG tablet Take 1 tablet by mouth daily.  . metoprolol succinate (TOPROL-XL) 50 MG 24 hr tablet Take 1 tablet (50 mg total) by mouth daily. Take with or immediately following a meal.  . Misc Natural Products (TART CHERRY ADVANCED PO) Take by mouth.  . Multiple Vitamins-Minerals (CENTRUM PO) Take by mouth daily.    . potassium citrate (UROCIT-K 10) 10 MEQ (1080 MG) SR tablet Take 10 mEq by mouth daily.   . rosuvastatin (CRESTOR) 10 MG tablet TAKE 1 TABLET BY MOUTH EVERY DAY  . [DISCONTINUED] ALPRAZolam (XANAX) 0.25 MG tablet TAKE 2 TABLETS BY MOUTH AT BEDTIME AS NEEDED FOR ANXIETY  . [DISCONTINUED] meloxicam (MOBIC) 7.5 MG tablet TAKE 1 TABLET BY MOUTH EVERY DAY (Patient not taking: Reported on 02/21/2019)  . [DISCONTINUED] metoprolol succinate (TOPROL-XL) 50 MG 24 hr tablet Take 1 tablet (50 mg total) by mouth daily. Take with or immediately following a meal.  . [DISCONTINUED] simvastatin (ZOCOR) 20 MG tablet Take 20 mg by mouth at bedtime.     No facility-administered encounter medications on file as of 02/21/2019.     Activities of Daily Living In your present state of health, do you have any difficulty performing the following activities: 02/21/2019  Hearing? N  Vision? N  Difficulty concentrating or making decisions? N  Walking or climbing stairs? N  Dressing or bathing? N  Doing errands, shopping? N  Preparing Food and eating ? N  Using the Toilet? N  In the past six months, have you  accidently leaked urine? N  Do you have problems with loss of bowel control? N  Managing your Medications? N  Managing your Finances? N  Housekeeping or managing your Housekeeping? N  Some recent data might be hidden    Patient Care Team: Marrian Salvage, [place] as  PCP - General (Internal Medicine) Pixie Casino, MD as PCP - Cardiology (Cardiology) Lyndal Pulley, DO as Consulting Physician (Family Medicine) Rutherford Guys, MD as Consulting Physician (Ophthalmology)    Assessment:   This is a routine wellness examination for [place]. Physical assessment deferred to PCP.  Exercise Activities and Dietary recommendations Current Exercise Habits: Home exercise routine, Type of exercise: walking(dancing), Time (Minutes): 30, Frequency (Times/Week): 3, Weekly Exercise (Minutes/Week): 90, Intensity: Mild, Exercise limited by: orthopedic condition(s)  Diet (meal preparation, eat out, water intake, caffeinated beverages, dairy products, fruits and vegetables): in general, a "healthy" diet     Reviewed heart healthy diet. Encouraged patient to increase daily water and healthy fluid intake. Diet education was attached to patient's AVS.   Goals      Patient Stated   . patient (pt-stated)     To take the time to think about future and make plans      Other   . Patient Stated     I want to get a stationary bike to ride and begin to exercise. Enjoy life and family. Maybe check into zumba classes.    . Patient Stated     I want to stay active by dancing around the house while listening to [place]. Enjoy family and life.       Fall Risk Fall Risk  02/21/2019 02/15/2018 02/02/2017 01/05/2016 11/17/2015  Falls in the past year? 1 No No No No  Number falls in past yr: 0 - - - -  Injury with Fall? 1 - - - -    Depression Screen PHQ 2/9 Scores 02/21/2019 02/15/2018 09/19/2017 08/15/2017  PHQ - 2 Score 1 1 0 0  PHQ- 9 Score 1 1 1  0     Cognitive Function       Ad8 score reviewed for  issues:  Issues making decisions: no  Less interest in hobbies / activities: no  Repeats questions, stories (family complaining): no  Trouble using ordinary gadgets (microwave, computer, phone):no  Forgets the month or year: no  Mismanaging finances: no  Remembering appts: no  Daily problems with thinking and/or memory: no Ad8 score is= 0  Immunization History  Administered Date(s) Administered  . Influenza Split 06/21/2011, 06/12/2012  . Influenza Whole 07/11/2007, 06/10/2008, 06/29/2010  . Influenza, High Dose Seasonal PF 06/18/2013, 04/21/2016, 05/23/2017, 05/17/2018  . Influenza,inj,Quad PF,6+ Mos 06/10/2014, 05/07/2015  . Pneumococcal Conjugate-13 11/17/2015  . Pneumococcal Polysaccharide-23 09/17/2013  . Td 01/05/2003  . Tdap 02/05/2013  . Zoster 11/10/2011   Screening Tests Health Maintenance  Topic Date Due  . COLONOSCOPY  01/03/2017  . INFLUENZA VACCINE  03/29/2019  . MAMMOGRAM  08/06/2020  . TETANUS/TDAP  02/06/2023  . DEXA SCAN  Completed  . Hepatitis C Screening  Completed  . PNA vac Low Risk Adult  Completed      Plan:     Reviewed health maintenance screenings with patient today and relevant education, vaccines, and/or referrals were provided.   Continue to eat heart healthy diet (full of fruits, vegetables, whole grains, lean protein, water--limit salt, fat, and sugar intake) and increase physical activity as tolerated.  Continue doing brain stimulating activities (puzzles, reading, adult coloring books, staying active) to keep memory sharp.  I have personally reviewed and noted the following in the patient's chart:   . Medical and social history . Use of alcohol, tobacco or illicit drugs  . Current medications and supplements . Functional ability and status . Nutritional status . Physical activity .  Advanced directives . List of other physicians . Vitals . Screenings to include cognitive, depression, and falls . Referrals and appointments   In addition, I have reviewed and discussed with patient certain preventive protocols, quality metrics, and best practice recommendations. A written personalized care plan for preventive services as well as general preventive health recommendations were provided to patient.     Michiel Cowboy, RN  02/21/2019   Medical screening examination/treatment/procedure(s) were performed by non-physician practitioner and as supervising provider I was immediately available for consultation/collaboration.  I agree with above. Marrian Salvage, FNP

## 2019-02-21 ENCOUNTER — Ambulatory Visit (INDEPENDENT_AMBULATORY_CARE_PROVIDER_SITE_OTHER): Payer: Medicare Other | Admitting: *Deleted

## 2019-02-21 ENCOUNTER — Ambulatory Visit (INDEPENDENT_AMBULATORY_CARE_PROVIDER_SITE_OTHER): Payer: Medicare Other | Admitting: Family

## 2019-02-21 ENCOUNTER — Other Ambulatory Visit: Payer: Self-pay

## 2019-02-21 ENCOUNTER — Encounter: Payer: Self-pay | Admitting: Family

## 2019-02-21 VITALS — BP 130/84 | HR 65 | Ht 66.0 in | Wt 158.0 lb

## 2019-02-21 VITALS — BP 130/84 | HR 82 | Temp 98.3°F | Ht 65.0 in | Wt 158.0 lb

## 2019-02-21 DIAGNOSIS — R200 Anesthesia of skin: Secondary | ICD-10-CM

## 2019-02-21 DIAGNOSIS — R202 Paresthesia of skin: Secondary | ICD-10-CM

## 2019-02-21 DIAGNOSIS — E039 Hypothyroidism, unspecified: Secondary | ICD-10-CM | POA: Diagnosis not present

## 2019-02-21 DIAGNOSIS — Z1211 Encounter for screening for malignant neoplasm of colon: Secondary | ICD-10-CM | POA: Diagnosis not present

## 2019-02-21 DIAGNOSIS — Z0000 Encounter for general adult medical examination without abnormal findings: Secondary | ICD-10-CM | POA: Diagnosis not present

## 2019-02-21 DIAGNOSIS — F32A Depression, unspecified: Secondary | ICD-10-CM

## 2019-02-21 DIAGNOSIS — E782 Mixed hyperlipidemia: Secondary | ICD-10-CM | POA: Diagnosis not present

## 2019-02-21 DIAGNOSIS — F419 Anxiety disorder, unspecified: Secondary | ICD-10-CM

## 2019-02-21 DIAGNOSIS — I10 Essential (primary) hypertension: Secondary | ICD-10-CM | POA: Diagnosis not present

## 2019-02-21 DIAGNOSIS — F329 Major depressive disorder, single episode, unspecified: Secondary | ICD-10-CM

## 2019-02-21 MED ORDER — ALPRAZOLAM 0.25 MG PO TABS: ORAL_TABLET | ORAL | 0 refills | Status: DC

## 2019-02-21 MED ORDER — METOPROLOL SUCCINATE ER 50 MG PO TB24: 50.0000 mg | ORAL_TABLET | Freq: Every day | ORAL | 3 refills | Status: DC

## 2019-02-21 NOTE — Progress Notes (Signed)
[name] is a 73 y.o. female with the following history as recorded in Epic[facility]:  Patient Active Problem List   Diagnosis Date Noted  . Lumbar spinal stenosis 07/16/2018  . Renal cyst 07/16/2018  . Left lumbar radiculopathy 06/24/2018  . Left hip pain 10/25/2017  . Anxiety and depression 05/23/2017  . Hyperlipidemia 04/21/2016  . Routine general medical examination at a health care facility 01/05/2016  . Medicare annual wellness visit, subsequent 01/05/2016  . Allergic rhinitis 12/29/2015  . Vaginal atrophy 10/22/2013  . DJD (degenerative joint disease) of knee 10/18/2012  . NEPHRECTOMY, HX OF 05/28/2009  . Hypothyroidism, postsurgical 07/18/2007  . HYPERLIPIDEMIA 07/18/2007  . Essential hypertension 07/18/2007  . OSTEOPENIA 07/15/2007  . History of colonic polyps 07/15/2007    Current Outpatient Medications  Medication Sig Dispense Refill  . acetaminophen (TYLENOL ARTHRITIS PAIN) 650 MG CR tablet Take 650 mg by mouth 4 (four) times daily as needed.     . ALPRAZolam (XANAX) 0.25 MG tablet TAKE 2 TABLETS BY MOUTH AT BEDTIME AS NEEDED FOR ANXIETY 60 tablet 0  . amLODipine (NORVASC) 5 MG tablet Take 1 tablet (5 mg total) by mouth daily. 90 tablet 1  . aspirin 81 MG tablet Take 81 mg by mouth daily.      . benazepril (LOTENSIN) 20 MG tablet TAKE 1 AND 1/2 TABLETS DAILY BY MOUTH 135 tablet 0  . Calcium Carbonate-Vitamin D (CALTRATE 600+D PO) Take by mouth daily.      [name] gabapentin (NEURONTIN) 100 MG capsule TAKE 2 CAPSULES (200 MG TOTAL) BY MOUTH AT BEDTIME. 180 capsule 2  . levothyroxine (SYNTHROID, LEVOTHROID) 88 MCG tablet Take 1 tablet by mouth daily. 30 tablet 2  . metoprolol succinate (TOPROL-XL) 50 MG 24 hr tablet Take 1 tablet (50 mg total) by mouth daily. Take with or immediately following a meal. 90 tablet 3  . Misc Natural Products (TART CHERRY ADVANCED PO) Take by mouth.    . Multiple Vitamins-Minerals (CENTRUM PO) Take by mouth daily.      . potassium citrate (UROCIT-K  10) 10 MEQ (1080 MG) SR tablet Take 10 mEq by mouth daily.     . rosuvastatin (CRESTOR) 10 MG tablet TAKE 1 TABLET BY MOUTH EVERY DAY 90 tablet 3   No current facility-administered medications for this visit.     Allergies: Codeine sulfate and Erythromycin  Past Medical History:  Diagnosis Date  . Adenomatous colon polyp   . Arthritis   . Cataract   . Heart murmur    MVP  . Hyperlipidemia   . Hypertension   . Thyroid disease     Past Surgical History:  Procedure Laterality Date  . BREAST EXCISIONAL BIOPSY Right 1983  . COLONOSCOPY W/ POLYPECTOMY  2003,2006,2013   [name]  . DILATION AND CURETTAGE OF UTERUS     X2  . NEPHRECTOMY  2010   Left for 9.8cm benign complex cyst  . THYROIDECTOMY  2000   [name]  . TONSILLECTOMY    . TOTAL ABDOMINAL HYSTERECTOMY W/ BILATERAL SALPINGOOPHORECTOMY  2003   [name] for dysfunctional menses    Family History  Problem Relation Age of Onset  . Transient ischemic attack Father        in 5s  . Coronary artery disease Father        stents @ 70  . Nephrolithiasis Father   . Hyperlipidemia Mother   . Transient ischemic attack Mother 40  . Thyroid disease Mother  hyperthyroid  . Urolithiasis Sister   . Diabetes Neg Hx     Social History   Tobacco Use  . Smoking status: Former Smoker    Packs/day: 0.50    Years: 4.00    Pack years: 2.00    Quit date: 08/29/1975    Years since quitting: 43.5  . Smokeless tobacco: Never Used  . Tobacco comment: smoked 1973-1977, up to 1/2 ppd  Substance Use Topics  . Alcohol use: Yes    Alcohol/week: 0.0 standard drinks    Comment: minimally  1 every 6 months    Subjective:  Patient presents for follow-up on her chronic care needs including: 1) hypertension; 2) hypothyroidism; 3) situational anxiety 4) hyperlipidemia- has been referred to lipid clinic; up to date on GYN needs;  Colonoscopy was due in 2018; Will be having her AWV with our RN later today;  Has one kidney- right kidney;  sees urology yearly;     Objective:  Vitals:   02/21/19 1337  BP: 130/84  Pulse: 82  Temp: 98.3 F (36.8 C)  TempSrc: Oral  SpO2: 96%  Weight: 158 lb 0.6 oz (71.7 kg)  Height: 5\' 5"  (1.651 m)    General: Well developed, well nourished, in no acute distress  Skin : Warm and dry.  Head: Normocephalic and atraumatic  Lungs: Respirations unlabored; clear to auscultation bilaterally without wheeze, rales, rhonchi  CVS exam: normal rate and regular rhythm.  Neurologic: Alert and oriented; speech intact; face symmetrical; moves all extremities well; CNII-XII intact without focal deficit   Assessment:  1. Hypothyroidism, unspecified type   2. Encounter for screening colonoscopy   3. HYPERLIPIDEMIA   4. Essential hypertension   5. Numbness and tingling   6. Anxiety and depression     Plan:  1. Check TSH; will adjust Synthroid as needed; 2. Refer to Dr. Fuller Plan for colonoscopy- was due in 2018; should be last colonoscopy she needs; 3. Check lipid panel- will determine if referral to lipid clinic is needed/ ? Increase Crestor to 20 mg daily; 4. Stable; continue same medications; 5. Check B12 level; 6. Stable- refill updated on Xanax; uses 0.25 mg qhs;   Return for Dr. Tamala Julian- already established patient.  Orders Placed This Encounter  Procedures  . TSH    Standing Status:   Future    Standing Expiration Date:   02/21/2020  . Lipid panel    Standing Status:   Future    Standing Expiration Date:   02/21/2020  . B12    Standing Status:   Future    Standing Expiration Date:   02/21/2020  . Ambulatory referral to Gastroenterology    Referral Priority:   Routine    Referral Type:   Consultation    Referral Reason:   Specialty Services Required    Referred to Provider:   Ladene Artist, MD    Number of Visits Requested:   1    Requested Prescriptions   Signed Prescriptions Disp Refills  . metoprolol succinate (TOPROL-XL) 50 MG 24 hr tablet 90 tablet 3    Sig: Take 1 tablet (50  mg total) by mouth daily. Take with or immediately following a meal.  . ALPRAZolam (XANAX) 0.25 MG tablet 60 tablet 0    Sig: TAKE 2 TABLETS BY MOUTH AT BEDTIME AS NEEDED FOR ANXIETY

## 2019-02-21 NOTE — Patient Instructions (Addendum)
If you cannot attend class in person, you can still exercise at home. Video taped versions of [facility] classes are shown on [employer] (GTN) at 8 am and 1 pm Mondays through Fridays. You can also purchase a copy of the [facility] DVD by calling [place] (GTN) [employer]. GTN is available on [employer] channel 13 with a digital cable box and on [employer] channel 31. GTN is also available on [employer], channel 99. To view GTN, go to channel 99, press OK, select [place], then select GTN to start the channel.  Continue to eat heart healthy diet (full of fruits, vegetables, whole grains, lean protein, water--limit salt, fat, and sugar intake) and increase physical activity as tolerated.  Continue doing brain stimulating activities (puzzles, reading, adult coloring books, staying active) to keep memory sharp.    [name] , Thank you for taking time to come for your Medicare Wellness Visit. I appreciate your ongoing commitment to your health goals. Please review the following plan we discussed and let me know if I can assist you in the future.   These are the goals we discussed: Goals      Patient Stated   . patient (pt-stated)     To take the time to think about future and make plans      Other   . Patient Stated     I want to get a stationary bike to ride and begin to exercise. Enjoy life and family. Maybe check into zumba classes.    . Patient Stated     I want to stay active by dancing around the house while listening to [place]. Enjoy family and life.       This is a list of the screening recommended for you and due dates:  Health Maintenance  Topic Date Due  . Colon Cancer Screening  01/03/2017  . Flu Shot  03/29/2019  . Mammogram  08/06/2020  . Tetanus Vaccine  02/06/2023  . DEXA scan (bone density measurement)  Completed  .  Hepatitis C: One time screening is recommended by Center for Disease Control  (CDC) for  adults born from  [birth year] through 1965.   Completed  . Pneumonia vaccines  Completed    Fat and Cholesterol Restricted Eating Plan Getting too much fat and cholesterol in your diet may cause health problems. Choosing the right foods helps keep your fat and cholesterol at normal levels. This can keep you from getting certain diseases. Your doctor may recommend an eating plan that includes:  Total fat: ______% or less of total calories a day.  Saturated fat: ______% or less of total calories a day.  Cholesterol: less than _________mg a day.  Fiber: ______g a day. What are tips for following this plan? Meal planning  At meals, divide your plate into four equal parts: ? Fill one-half of your plate with vegetables and green salads. ? Fill one-fourth of your plate with whole grains. ? Fill one-fourth of your plate with low-fat (lean) protein foods.  Eat fish that is high in omega-3 fats at least two times a week. This includes mackerel, tuna, sardines, and salmon.  Eat foods that are high in fiber, such as whole grains, beans, apples, broccoli, carrots, peas, and barley. General tips   Work with your doctor to lose weight if you need to.  Avoid: ? Foods with added sugar. ? Fried foods. ? Foods with partially hydrogenated oils.  Limit alcohol intake to no more than 1  drink a day for nonpregnant women and 2 drinks a day for men. One drink equals 12 oz of beer, 5 oz of wine, or 1 oz of hard liquor. Reading food labels  Check food labels for: ? Trans fats. ? Partially hydrogenated oils. ? Saturated fat (g) in each serving. ? Cholesterol (mg) in each serving. ? Fiber (g) in each serving.  Choose foods with healthy fats, such as: ? Monounsaturated fats. ? Polyunsaturated fats. ? Omega-3 fats.  Choose grain products that have whole grains. Look for the word "whole" as the first word in the ingredient list. Cooking  Cook foods using low-fat methods. These include baking, boiling, grilling, and  broiling.  Eat more home-cooked foods. Eat at restaurants and buffets less often.  Avoid cooking using saturated fats, such as butter, cream, palm oil, palm kernel oil, and coconut oil. Recommended foods  Fruits  All fresh, canned (in natural juice), or frozen fruits. Vegetables  Fresh or frozen vegetables (raw, steamed, roasted, or grilled). Green salads. Grains  Whole grains, such as whole wheat or whole grain breads, crackers, cereals, and pasta. Unsweetened oatmeal, bulgur, barley, quinoa, or brown rice. Corn or whole wheat flour tortillas. Meats and other protein foods  Ground beef (85% or leaner), grass-fed beef, or beef trimmed of fat. Skinless chicken or [place]. Ground chicken or [place]. Pork trimmed of fat. All fish and seafood. Egg whites. Dried beans, peas, or lentils. Unsalted nuts or seeds. Unsalted canned beans. Nut butters without added sugar or oil. Dairy  Low-fat or nonfat dairy products, such as skim or 1% milk, 2% or reduced-fat cheeses, low-fat and fat-free ricotta or cottage cheese, or plain low-fat and nonfat yogurt. Fats and oils  Tub margarine without trans fats. Light or reduced-fat mayonnaise and salad dressings. Avocado. Olive, canola, sesame, or safflower oils. The items listed above may not be a complete list of foods and beverages you can eat. Contact a dietitian for more information. Foods to avoid Fruits  Canned fruit in heavy syrup. Fruit in cream or butter sauce. Fried fruit. Vegetables  Vegetables cooked in cheese, cream, or butter sauce. Fried vegetables. Grains  White bread. White pasta. White rice. Cornbread. Bagels, pastries, and croissants. Crackers and snack foods that contain trans fat and hydrogenated oils. Meats and other protein foods  Fatty cuts of meat. Ribs, chicken wings, bacon, sausage, bologna, salami, chitterlings, fatback, hot dogs, bratwurst, and packaged lunch meats. Liver and organ meats. Whole eggs and egg yolks. Chicken  and [place] with skin. Fried meat. Dairy  Whole or 2% milk, cream, half-and-half, and cream cheese. Whole milk cheeses. Whole-fat or sweetened yogurt. Full-fat cheeses. Nondairy creamers and whipped toppings. Processed cheese, cheese spreads, and cheese curds. Beverages  Alcohol. Sugar-sweetened drinks such as sodas, lemonade, and fruit drinks. Fats and oils  Butter, stick margarine, lard, shortening, ghee, or bacon fat. Coconut, palm kernel, and palm oils. Sweets and desserts  Corn syrup, sugars, honey, and molasses. Candy. Jam and jelly. Syrup. Sweetened cereals. Cookies, pies, cakes, donuts, muffins, and ice cream. The items listed above may not be a complete list of foods and beverages you should avoid. Contact a dietitian for more information. Summary  Choosing the right foods helps keep your fat and cholesterol at normal levels. This can keep you from getting certain diseases.  At meals, fill one-half of your plate with vegetables and green salads.  Eat high-fiber foods, like whole grains, beans, apples, carrots, peas, and barley.  Limit added sugar, saturated fats, alcohol, and  fried foods. This information is not intended to replace advice given to you by your health care provider. Make sure you discuss any questions you have with your health care provider. Document Released: 02/13/2012 Document Revised: 04/17/2018 Document Reviewed: 05/01/2017 Elsevier Interactive Patient Education  2019 [place] 65 Years and Older, Female Preventive care refers to lifestyle choices and visits with your health care provider that can promote health and wellness. What does preventive care include?  A yearly physical exam. This is also called an annual well check.  Dental exams once or twice a year.  Routine eye exams. Ask your health care provider how often you should have your eyes checked.  Personal lifestyle choices, including: ? Daily care of your teeth and  gums. ? Regular physical activity. ? Eating a healthy diet. ? Avoiding tobacco and drug use. ? Limiting alcohol use. ? Practicing safe sex. ? Taking low-dose aspirin every day. ? Taking vitamin and mineral supplements as recommended by your health care provider. What happens during an annual well check? The services and screenings done by your health care provider during your annual well check will depend on your age, overall health, lifestyle risk factors, and family history of disease. Counseling Your health care provider may ask you questions about your:  Alcohol use.  Tobacco use.  Drug use.  Emotional well-being.  Home and relationship well-being.  Sexual activity.  Eating habits.  History of falls.  Memory and ability to understand (cognition).  Work and work Statistician.  Reproductive health.  Screening You may have the following tests or measurements:  Height, weight, and BMI.  Blood pressure.  Lipid and cholesterol levels. These may be checked every 5 years, or more frequently if you are over 57 years old.  Skin check.  Lung cancer screening. You may have this screening every year starting at age 50 if you have a 30-pack-year history of smoking and currently smoke or have quit within the past 15 years.  Colorectal cancer screening. All adults should have this screening starting at age 37 and continuing until age 72. You will have tests every 1-10 years, depending on your results and the type of screening test. People at increased risk should start screening at an earlier age. Screening tests may include: ? Guaiac-based fecal occult blood testing. ? Fecal immunochemical test (FIT). ? Stool DNA test. ? Virtual colonoscopy. ? Sigmoidoscopy. During this test, a flexible tube with a tiny camera (sigmoidoscope) is used to examine your rectum and lower colon. The sigmoidoscope is inserted through your anus into your rectum and lower colon. ? Colonoscopy. During  this test, a long, thin, flexible tube with a tiny camera (colonoscope) is used to examine your entire colon and rectum.  Hepatitis C blood test.  Hepatitis B blood test.  Sexually transmitted disease (STD) testing.  Diabetes screening. This is done by checking your blood sugar (glucose) after you have not eaten for a while (fasting). You may have this done every 1-3 years.  Bone density scan. This is done to screen for osteoporosis. You may have this done starting at age 46.  Mammogram. This may be done every 1-2 years. Talk to your health care provider about how often you should have regular mammograms. Talk with your health care provider about your test results, treatment options, and if necessary, the need for more tests. Vaccines Your health care provider may recommend certain vaccines, such as:  Influenza vaccine. This is recommended every year.  Tetanus, diphtheria,  and acellular pertussis (Tdap, Td) vaccine. You may need a Td booster every 10 years.  Varicella vaccine. You may need this if you have not been vaccinated.  Zoster vaccine. You may need this after age 15.  Measles, mumps, and rubella (MMR) vaccine. You may need at least one dose of MMR if you were born in 1957 or later. You may also need a second dose.  Pneumococcal 13-valent conjugate (PCV13) vaccine. One dose is recommended after age 41.  Pneumococcal polysaccharide (PPSV23) vaccine. One dose is recommended after age 79.  Meningococcal vaccine. You may need this if you have certain conditions.  Hepatitis A vaccine. You may need this if you have certain conditions or if you travel or work in places where you may be exposed to hepatitis A.  Hepatitis B vaccine. You may need this if you have certain conditions or if you travel or work in places where you may be exposed to hepatitis B.  Haemophilus influenzae type b (Hib) vaccine. You may need this if you have certain conditions. Talk to your health care  provider about which screenings and vaccines you need and how often you need them. This information is not intended to replace advice given to you by your health care provider. Make sure you discuss any questions you have with your health care provider. Document Released: 09/10/2015 Document Revised: 10/04/2017 Document Reviewed: 06/15/2015 Elsevier Interactive Patient Education  2019 [employer].

## 2019-03-04 NOTE — Progress Notes (Signed)
[name] [address], [place] [address] Phone: [phone] Subjective:   I [name] am serving as a Education administrator for Dr. Hulan Saas.  I'm seeing this patient by the request  of:    CC: Left hip and back pain follow-up  MHD:[id number]  [name] is a 73 y.o. female coming in with complaint of left hip pain. States she is not doing any better. Feels about the same. States her last injection did not help at all.  Patient states that the epidural for her back pain did not make any significant improvement.  Does have spinal stenosis at L4-L5.  States that the pain seems to be more localized in the lateral hip.  Waking her up at night.  Denies any radiation of pain down the leg and may be the injection helped that.     Past Medical History:  Diagnosis Date  . Adenomatous colon polyp   . Arthritis   . Cataract   . Heart murmur    MVP  . Hyperlipidemia   . Hypertension   . Thyroid disease    Past Surgical History:  Procedure Laterality Date  . BREAST EXCISIONAL BIOPSY Right 1983  . COLONOSCOPY W/ POLYPECTOMY  2003,2006,2013   [name]  . DILATION AND CURETTAGE OF UTERUS     X2  . NEPHRECTOMY  2010   Left for 9.8cm benign complex cyst  . THYROIDECTOMY  2000   [name]  . TONSILLECTOMY    . TOTAL ABDOMINAL HYSTERECTOMY W/ BILATERAL SALPINGOOPHORECTOMY  2003   [name] for dysfunctional menses   Social History   Socioeconomic History  . Marital status: Married    Spouse name: Not on file  . Number of children: 2  . Years of education: Not on file  . Highest education level: Not on file  Occupational History  . Occupation: [employer]  . Financial resource strain: Not hard at all  . Food insecurity    Worry: Never true    Inability: Never true  . Transportation needs    Medical: No    Non-medical: No  Tobacco Use  . Smoking status: Former Smoker    Packs/day: 0.50    Years: 4.00    Pack years: 2.00   Quit date: 08/29/1975    Years since quitting: 43.5  . Smokeless tobacco: Never Used  . Tobacco comment: smoked 1973-1977, up to 1/2 ppd  Substance and Sexual Activity  . Alcohol use: Yes    Alcohol/week: 0.0 standard drinks    Comment: minimally  1 every 6 months  . Drug use: No  . Sexual activity: Yes    Comment: HYST  Lifestyle  . Physical activity    Days per week: 0 days    Minutes per session: 0 min  . Stress: Only a little  Relationships  . Social connections    Talks on phone: More than three times a week    Gets together: More than three times a week    Attends religious service: 1 to 4 times per year    Active member of club or organization: Yes    Attends meetings of clubs or organizations: 1 to 4 times per year    Relationship status: Married  Other Topics Concern  . Not on file  Social History Narrative   Fun/Hobbies: Grandkids   Allergies  Allergen Reactions  . Codeine Sulfate     "made me jumpy"  . Erythromycin  nausea   Family History  Problem Relation Age of Onset  . Transient ischemic attack Father        in 47s  . Coronary artery disease Father        stents @ 10  . Nephrolithiasis Father   . Hyperlipidemia Mother   . Transient ischemic attack Mother 74  . Thyroid disease Mother        hyperthyroid  . Urolithiasis Sister   . Diabetes Neg Hx     Current Outpatient Medications (Endocrine & Metabolic):  .  levothyroxine (SYNTHROID, LEVOTHROID) 88 MCG tablet, Take 1 tablet by mouth daily.  Current Outpatient Medications (Cardiovascular):  .  amLODipine (NORVASC) 5 MG tablet, Take 1 tablet (5 mg total) by mouth daily. .  benazepril (LOTENSIN) 20 MG tablet, TAKE 1 AND 1/2 TABLETS DAILY BY MOUTH .  metoprolol succinate (TOPROL-XL) 50 MG 24 hr tablet, Take 1 tablet (50 mg total) by mouth daily. Take with or immediately following a meal. .  rosuvastatin (CRESTOR) 10 MG tablet, TAKE 1 TABLET BY MOUTH EVERY DAY   Current Outpatient Medications  (Analgesics):  .  acetaminophen (TYLENOL ARTHRITIS PAIN) 650 MG CR tablet, Take 650 mg by mouth 4 (four) times daily as needed.  [name]  aspirin 81 MG tablet, Take 81 mg by mouth daily.     Current Outpatient Medications (Other):  [name]  ALPRAZolam (XANAX) 0.25 MG tablet, TAKE 2 TABLETS BY MOUTH AT BEDTIME AS NEEDED FOR ANXIETY .  Calcium Carbonate-Vitamin D (CALTRATE 600+D PO), Take by mouth daily.   [name]  gabapentin (NEURONTIN) 100 MG capsule, TAKE 2 CAPSULES (200 MG TOTAL) BY MOUTH AT BEDTIME. [name]  Misc Natural Products (TART CHERRY ADVANCED PO), Take by mouth. .  Multiple Vitamins-Minerals (CENTRUM PO), Take by mouth daily.   .  potassium citrate (UROCIT-K 10) 10 MEQ (1080 MG) SR tablet, Take 10 mEq by mouth daily.  [name]  venlafaxine XR (EFFEXOR XR) 37.5 MG 24 hr capsule, Take 1 capsule (37.5 mg total) by mouth daily with breakfast.    Past medical history, social, surgical and family history all reviewed in electronic medical record.  No pertanent information unless stated regarding to the chief complaint.   Review of Systems:  No headache, visual changes, nausea, vomiting, diarrhea, constipation, dizziness, abdominal pain, skin rash, fevers, chills, night sweats, weight loss, swollen lymph nodes, body aches, joint swelling, muscle aches, chest pain, shortness of breath, mood changes.   Objective  Blood pressure (!) 150/80, pulse 87, height 5\' 6"  (1.676 m), weight 158 lb (71.7 kg), SpO2 97 %.    General: No apparent distress alert and oriented x3 mood and affect normal, dressed appropriately.  HEENT: Pupils equal, extraocular movements intact  Respiratory: Patient's speak in full sentences and does not appear short of breath  Cardiovascular: No lower extremity edema, non tender, no erythema  Skin: Warm dry intact with no signs of infection or rash on extremities or on axial skeleton.  Abdomen: Soft nontender  Neuro: Cranial nerves II through XII are intact, neurovascularly intact in all  extremities with 2+ DTRs and 2+ pulses.  Lymph: No lymphadenopathy of posterior or anterior cervical chain or axillae bilaterally.  Gait normal with good balance and coordination.  MSK:  Non tender with full range of motion and good stability and symmetric strength and tone of shoulders, elbows, wrist,  knee and ankles bilaterally.  Palpation: Lovena Le does have a positive Corky Sox.  Severe tenderness to palpation over the greater trochanteric area.  The  patient straight leg test does have some tightness.  No significant radicular symptoms though.  5 out of 5 strength of the lower extremity.  Tenderness to palpation though in the paraspinal musculature of the lumbar spine.   Procedure: Real-time Ultrasound Guided Injection of left  greater trochanteric bursitis secondary to patient's body habitus Device: GE Logiq Q7  Ultrasound guided injection is preferred based studies that show increased duration, increased effect, greater accuracy, decreased procedural pain, increased response rate, and decreased cost with ultrasound guided versus blind injection.  Verbal informed consent obtained.  Time-out conducted.  Noted no overlying erythema, induration, or other signs of local infection.  Skin prepped in a sterile fashion.  Local anesthesia: Topical Ethyl chloride.  With sterile technique and under real time ultrasound guidance:  Greater trochanteric area was visualized and patient's bursa was noted. A 22-gauge 3 inch needle was inserted and 4 cc of 0.5% Marcaine and 1 cc of Kenalog 40 mg/dL was injected. Pictures taken Completed without difficulty  Pain immediately resolved suggesting accurate placement of the medication.  Advised to call if fevers/chills, erythema, induration, drainage, or persistent bleeding.  Images permanently stored and available for review in the ultrasound unit.  Impression: Technically successful ultrasound guided injection.   Impression and Recommendations:     This case  required medical decision making of moderate complexity. The above documentation has been reviewed and is accurate and complete Lyndal Pulley, DO       Note: This dictation was prepared with Dragon dictation along with smaller phrase technology. Any transcriptional errors that result from this process are unintentional.

## 2019-03-05 ENCOUNTER — Other Ambulatory Visit: Payer: Self-pay

## 2019-03-05 ENCOUNTER — Ambulatory Visit: Payer: Medicare Other | Admitting: Family Medicine

## 2019-03-05 ENCOUNTER — Encounter: Payer: Self-pay | Admitting: Family Medicine

## 2019-03-05 ENCOUNTER — Ambulatory Visit: Payer: Self-pay

## 2019-03-05 VITALS — BP 150/80 | HR 87 | Ht 66.0 in | Wt 158.0 lb

## 2019-03-05 DIAGNOSIS — M5416 Radiculopathy, lumbar region: Secondary | ICD-10-CM | POA: Diagnosis not present

## 2019-03-05 DIAGNOSIS — M2550 Pain in unspecified joint: Secondary | ICD-10-CM

## 2019-03-05 DIAGNOSIS — M7062 Trochanteric bursitis, left hip: Secondary | ICD-10-CM

## 2019-03-05 DIAGNOSIS — M25552 Pain in left hip: Secondary | ICD-10-CM

## 2019-03-05 MED ORDER — VENLAFAXINE HCL ER 37.5 MG PO CP24: 37.5000 mg | ORAL_CAPSULE | Freq: Every day | ORAL | 0 refills | Status: DC

## 2019-03-05 NOTE — Patient Instructions (Addendum)
Good to see you Gabapentin 300 at night  Labs downstairs Effexor 37.5 filled today at your pharmacy 4-6 weeks

## 2019-03-05 NOTE — Assessment & Plan Note (Signed)
I do believe that patient is still having a left-sided lumbar radiculopathy.  Did not respond well to the last epidural.  We discussed with patient in great length.  Patient has had significant other things going on at the moment that could be increasing some of the discomfort and pain.  Attempted a greater trochanteric injection on the left hip today.  Increase gabapentin to 300 mg nightly and Effexor during the day.  Discussed topical anti-inflammatories and icing regimen.  Patient will follow-up again in  6 weeks

## 2019-03-05 NOTE — Assessment & Plan Note (Signed)
Patient given injection and tolerated the procedure well.  Discussed icing regimen and home exercise.  Discussed increasing activity slowly.  Follow-up again 4 to 6 weeks.  Do believe some of the pain is secondary to the radicular symptoms of patient's lumbar spine.

## 2019-03-06 ENCOUNTER — Other Ambulatory Visit: Payer: Self-pay | Admitting: Internal Medicine

## 2019-03-07 ENCOUNTER — Other Ambulatory Visit (INDEPENDENT_AMBULATORY_CARE_PROVIDER_SITE_OTHER): Payer: Medicare Other

## 2019-03-07 DIAGNOSIS — E559 Vitamin D deficiency, unspecified: Secondary | ICD-10-CM | POA: Diagnosis not present

## 2019-03-07 DIAGNOSIS — M2550 Pain in unspecified joint: Secondary | ICD-10-CM | POA: Diagnosis not present

## 2019-03-07 DIAGNOSIS — R202 Paresthesia of skin: Secondary | ICD-10-CM | POA: Diagnosis not present

## 2019-03-07 DIAGNOSIS — E039 Hypothyroidism, unspecified: Secondary | ICD-10-CM

## 2019-03-07 DIAGNOSIS — R200 Anesthesia of skin: Secondary | ICD-10-CM | POA: Diagnosis not present

## 2019-03-07 DIAGNOSIS — E782 Mixed hyperlipidemia: Secondary | ICD-10-CM

## 2019-03-07 LAB — CBC WITH DIFFERENTIAL/PLATELET
Basophils Absolute: 0 10*3/uL (ref 0.0–0.1)
Basophils Relative: 0.3 % (ref 0.0–3.0)
Eosinophils Absolute: 0 10*3/uL (ref 0.0–0.7)
Eosinophils Relative: 0.1 % (ref 0.0–5.0)
HCT: 42.1 % (ref 36.0–46.0)
Hemoglobin: 14.1 g/dL (ref 12.0–15.0)
Lymphocytes Relative: 13 % (ref 12.0–46.0)
Lymphs Abs: 1.7 10*3/uL (ref 0.7–4.0)
MCHC: 33.5 g/dL (ref 30.0–36.0)
MCV: 92.9 fl (ref 78.0–100.0)
Monocytes Absolute: 1 10*3/uL (ref 0.1–1.0)
Monocytes Relative: 7.5 % (ref 3.0–12.0)
Neutro Abs: 10.3 10*3/uL — ABNORMAL HIGH (ref 1.4–7.7)
Neutrophils Relative %: 79.1 % — ABNORMAL HIGH (ref 43.0–77.0)
Platelets: 351 10*3/uL (ref 150.0–400.0)
RBC: 4.53 Mil/uL (ref 3.87–5.11)
RDW: 13.9 % (ref 11.5–15.5)
WBC: 13 10*3/uL — ABNORMAL HIGH (ref 4.0–10.5)

## 2019-03-07 LAB — COMPREHENSIVE METABOLIC PANEL
ALT: 19 U/L (ref 0–35)
AST: 16 U/L (ref 0–37)
Albumin: 4.5 g/dL (ref 3.5–5.2)
Alkaline Phosphatase: 98 U/L (ref 39–117)
BUN: 25 mg/dL — ABNORMAL HIGH (ref 6–23)
CO2: 26 mEq/L (ref 19–32)
Calcium: 9.2 mg/dL (ref 8.4–10.5)
Chloride: 105 mEq/L (ref 96–112)
Creatinine, Ser: 0.83 mg/dL (ref 0.40–1.20)
GFR: 67.36 mL/min (ref >60.00)
Glucose, Bld: 89 mg/dL (ref 70–99)
Potassium: 4.5 mEq/L (ref 3.5–5.1)
Sodium: 141 mEq/L (ref 135–145)
Total Bilirubin: 0.6 mg/dL (ref 0.2–1.2)
Total Protein: 7.4 g/dL (ref 6.0–8.3)

## 2019-03-07 LAB — LIPID PANEL
Cholesterol: 280 mg/dL — ABNORMAL HIGH (ref 0–200)
HDL: 48.1 mg/dL (ref >39.00)
NonHDL: 231.76
Total CHOL/HDL Ratio: 6
Triglycerides: 284 mg/dL — ABNORMAL HIGH (ref 0.0–149.0)
VLDL: 56.8 mg/dL — ABNORMAL HIGH (ref 0.0–40.0)

## 2019-03-07 LAB — C-REACTIVE PROTEIN: CRP: <1 mg/dL (ref 0.5–20.0)

## 2019-03-07 LAB — FERRITIN: Ferritin: 51.4 ng/mL (ref 10.0–291.0)

## 2019-03-07 LAB — LDL CHOLESTEROL, DIRECT: Direct LDL: 177 mg/dL

## 2019-03-07 LAB — SEDIMENTATION RATE: Sed Rate: 27 mm/hr (ref 0–30)

## 2019-03-07 LAB — IBC PANEL
Iron: 141 ug/dL (ref 42–145)
Saturation Ratios: 39.7 % (ref 20.0–50.0)
Transferrin: 254 mg/dL (ref 212.0–360.0)

## 2019-03-07 LAB — URIC ACID: Uric Acid, Serum: 5.7 mg/dL (ref 2.4–7.0)

## 2019-03-07 LAB — VITAMIN B12: Vitamin B-12: 845 pg/mL (ref 211–911)

## 2019-03-07 LAB — TSH: TSH: 0.31 u[IU]/mL — ABNORMAL LOW (ref 0.35–4.50)

## 2019-03-09 ENCOUNTER — Encounter: Payer: Self-pay | Admitting: Family Medicine

## 2019-03-10 ENCOUNTER — Other Ambulatory Visit: Payer: Self-pay | Admitting: Family

## 2019-03-10 DIAGNOSIS — E782 Mixed hyperlipidemia: Secondary | ICD-10-CM

## 2019-03-10 DIAGNOSIS — E890 Postprocedural hypothyroidism: Secondary | ICD-10-CM

## 2019-03-10 MED ORDER — LEVOTHYROXINE SODIUM 75 MCG PO TABS: ORAL_TABLET | ORAL | 1 refills | Status: DC

## 2019-03-10 MED ORDER — ROSUVASTATIN CALCIUM 20 MG PO TABS: 20.0000 mg | ORAL_TABLET | Freq: Every day | ORAL | 1 refills | Status: DC

## 2019-03-13 LAB — VITAMIN D 1,25 DIHYDROXY
Vitamin D 1, 25 (OH)2 Total: 54 pg/mL (ref 18–72)
Vitamin D2 1, 25 (OH)2: <8 pg/mL
Vitamin D3 1, 25 (OH)2: 54 pg/mL

## 2019-03-13 LAB — ANGIOTENSIN CONVERTING ENZYME: Angiotensin-Converting Enzyme: <5 U/L — ABNORMAL LOW (ref 9–67)

## 2019-03-13 LAB — CALCIUM, IONIZED: Calcium, Ion: 5.28 mg/dL (ref 4.8–5.6)

## 2019-03-13 LAB — CYCLIC CITRUL PEPTIDE ANTIBODY, IGG: Cyclic Citrullin Peptide Ab: <16 UNITS

## 2019-03-13 LAB — ANA: Anti Nuclear Antibody (ANA): NEGATIVE

## 2019-03-13 LAB — PTH, INTACT AND CALCIUM
Calcium: 9.6 mg/dL (ref 8.6–10.4)
PTH: 47 pg/mL (ref 14–64)

## 2019-03-13 LAB — RHEUMATOID FACTOR: Rheumatoid fact SerPl-aCnc: 16 IU/mL — ABNORMAL HIGH (ref <14)

## 2019-03-17 ENCOUNTER — Telehealth: Payer: Self-pay | Admitting: *Deleted

## 2019-03-17 DIAGNOSIS — M5416 Radiculopathy, lumbar region: Secondary | ICD-10-CM

## 2019-03-17 NOTE — Telephone Encounter (Signed)
Called patient back in response to her VM. Patient states that the medication prescribed by Dr. Tamala Julian to treat her hip pain is not effective and is making her sleepy, she has decided not to take the effexor. Patient knows someone with very similar pain that she is experiencing who is seeing Dr. Sherley Bounds neurologist. This individual feels that Dr. Ronnald Ramp has helped a lot with the pain and patient asked if PCP could send a referral for her to see Dr. Ronnald Ramp for her hip pain.

## 2019-03-18 NOTE — Telephone Encounter (Signed)
Dr. Sherley Bounds is a neurosurgeon. I think she would do better to see an orthopedist. However, I would recommend that she ask Dr. Tamala Julian about who he wants her to see.

## 2019-03-18 NOTE — Telephone Encounter (Signed)
Called [name] to inform her of PCP's response. [name] stated she would be happy to see whoever Dr. Tamala Julian recommended and added that she has reached the point of where she feels the medications and treatments she has taken for the pain thus far are not working for her.

## 2019-03-20 NOTE — Telephone Encounter (Signed)
Dr. Ronnald Ramp would be fine, please put in referral

## 2019-03-25 ENCOUNTER — Encounter: Payer: Self-pay | Admitting: Family Medicine

## 2019-03-27 ENCOUNTER — Other Ambulatory Visit: Payer: Self-pay | Admitting: Family Medicine

## 2019-03-27 MED ORDER — MELOXICAM 15 MG PO TABS: 15.0000 mg | ORAL_TABLET | Freq: Every day | ORAL | 0 refills | Status: DC

## 2019-04-08 ENCOUNTER — Other Ambulatory Visit: Payer: Self-pay | Admitting: Family

## 2019-04-08 DIAGNOSIS — E782 Mixed hyperlipidemia: Secondary | ICD-10-CM

## 2019-04-08 DIAGNOSIS — E890 Postprocedural hypothyroidism: Secondary | ICD-10-CM

## 2019-04-08 NOTE — Telephone Encounter (Signed)
Called and left message for patient to return to clinic for lab recheck in about 2 weeks and that meds had been sent in.

## 2019-04-08 NOTE — Telephone Encounter (Signed)
Please remind her that I want her to come get her TSH and lipid panel checked in about 2 weeks.

## 2019-04-09 ENCOUNTER — Other Ambulatory Visit: Payer: Self-pay | Admitting: Neurological Surgery

## 2019-04-09 NOTE — Telephone Encounter (Signed)
Also sent my-chart message as well to follow up for labs in 2 weeks.

## 2019-04-16 ENCOUNTER — Other Ambulatory Visit: Payer: Self-pay | Admitting: Family

## 2019-04-16 DIAGNOSIS — E890 Postprocedural hypothyroidism: Secondary | ICD-10-CM

## 2019-04-16 DIAGNOSIS — F32A Depression, unspecified: Secondary | ICD-10-CM

## 2019-04-16 DIAGNOSIS — F329 Major depressive disorder, single episode, unspecified: Secondary | ICD-10-CM

## 2019-04-16 DIAGNOSIS — E782 Mixed hyperlipidemia: Secondary | ICD-10-CM

## 2019-04-18 NOTE — Progress Notes (Signed)
[id number] [name], [name] [address] Phone: [phone] Fax: [address], [address] [place] [address] Phone: [phone] Fax: [phone]      Your procedure is scheduled on Wednesday 04/23/2019.  Report to [name] Main Entrance "A" at 12:00 P.M., and check in at the Admitting office.  Call this number if you have problems the morning of surgery:  [phone]  Call [phone] if you have any questions prior to your surgery date Monday-Friday 8am-4pm    Remember:  Do not eat or drink after midnight the night before your surgery    Take these medicines the morning of surgery with A SIP OF WATER: Acetaminophen (Tylenol) - if needed Amlodipine (Norvasc) Levothyroxine (Synthroid) Metoprolol succinate (Toprol-XL) Rosuvastatin (Crestor)   7 days prior to surgery STOP taking any Meloxicam (Mobic), Aspirin (unless otherwise instructed by your surgeon), Aleve, Naproxen, Ibuprofen, Motrin, Advil, Goody's, BC's, all herbal medications, fish oil, and all vitamins and supplements, including Tart Cherry.  Follow your surgeon's instructions on when to stop Aspirin.  If no instructions were given by your surgeon then you will need to call the office to get those instructions.      The Morning of Surgery  Do not wear jewelry, make-up or nail polish.  Do not wear lotions, powders, or perfumes, or deodorant  Do not shave 48 hours prior to surgery.    Do not bring valuables to the hospital.  [facility] is not responsible for any belongings or valuables.  IF you are a smoker, DO NOT Smoke 24 hours prior to surgery  IF you wear a CPAP at night please bring your mask, tubing, and machine the morning of surgery   Remember that you must have someone to transport you home after your surgery, and remain with you for 24 hours  if you are discharged the same day.   Contacts, eyeglasses, hearing aids, dentures or bridgework may not be worn into surgery.    Leave your suitcase in the car.  After surgery it may be brought to your room.  For patients admitted to the hospital, discharge time will be determined by your treatment team.  Patients discharged the day of surgery will not be allowed to drive home.    Special instructions:   [facility]- Preparing For Surgery  Before surgery, you can play an important role. Because skin is not sterile, your skin needs to be as free of germs as possible. You can reduce the number of germs on your skin by washing with CHG (chlorahexidine gluconate) Soap before surgery.  CHG is an antiseptic cleaner which kills germs and bonds with the skin to continue killing germs even after washing.    Oral Hygiene is also important to reduce your risk of infection.  Remember - BRUSH YOUR TEETH THE MORNING OF SURGERY WITH YOUR REGULAR TOOTHPASTE  Please do not use if you have an allergy to CHG or antibacterial soaps. If your skin becomes reddened/irritated stop using the CHG.  Do not shave (including legs and underarms) for at least 48 hours prior to first CHG shower. It is OK to shave your face.  Please follow these instructions carefully.   1. Shower the NIGHT BEFORE SURGERY and the MORNING OF SURGERY with CHG Soap.   2. If you chose to wash your hair, wash your hair  first as usual with your normal shampoo.  3. After you shampoo, rinse your hair and body thoroughly to remove the shampoo.  4. Use CHG as you would any other liquid soap. You can apply CHG directly to the skin and wash gently with a scrungie or a clean washcloth.   5. Apply the CHG Soap to your body ONLY FROM THE NECK DOWN.  Do not use on open wounds or open sores. Avoid contact with your eyes, ears, mouth and genitals (private parts). Wash Face and genitals (private parts)  with your normal soap.   6. Wash thoroughly,  paying special attention to the area where your surgery will be performed.  7. Thoroughly rinse your body with warm water from the neck down.  8. DO NOT shower/wash with your normal soap after using and rinsing off the CHG Soap.  9. Pat yourself dry with a CLEAN TOWEL.  10. Wear CLEAN PAJAMAS to bed the night before surgery, wear comfortable clothes the morning of surgery  11. Place CLEAN SHEETS on your bed the night of your first shower and DO NOT SLEEP WITH PETS.    Day of Surgery:  Please shower the morning of surgery with the CHG soap Do not apply any deodorants/lotions.  Please wear clean clothes to the hospital/surgery center.   Remember to brush your teeth WITH YOUR REGULAR TOOTHPASTE.   Please read over the following fact sheets that you were given.

## 2019-04-21 ENCOUNTER — Ambulatory Visit (HOSPITAL_COMMUNITY)
Admission: RE | Admit: 2019-04-21 | Discharge: 2019-04-21 | Disposition: A | Discharge status: Home / Self Care | Payer: Medicare Other | Source: Ambulatory Visit | Attending: Neurological Surgery | Admitting: Neurological Surgery

## 2019-04-21 ENCOUNTER — Encounter (HOSPITAL_COMMUNITY): Payer: Self-pay

## 2019-04-21 ENCOUNTER — Other Ambulatory Visit (HOSPITAL_COMMUNITY)
Admission: RE | Admit: 2019-04-21 | Discharge: 2019-04-21 | Disposition: A | Discharge status: Home / Self Care | Payer: Medicare Other | Source: Ambulatory Visit | Attending: Neurological Surgery | Admitting: Neurological Surgery

## 2019-04-21 ENCOUNTER — Other Ambulatory Visit: Payer: Self-pay

## 2019-04-21 ENCOUNTER — Encounter (HOSPITAL_COMMUNITY)
Admission: RE | Admit: 2019-04-21 | Discharge: 2019-04-21 | Disposition: A | Discharge status: Home / Self Care | Payer: Medicare Other | Source: Ambulatory Visit | Attending: Neurological Surgery | Admitting: Neurological Surgery

## 2019-04-21 DIAGNOSIS — Z01818 Encounter for other preprocedural examination: Secondary | ICD-10-CM | POA: Insufficient documentation

## 2019-04-21 DIAGNOSIS — M4310 Spondylolisthesis, site unspecified: Secondary | ICD-10-CM | POA: Insufficient documentation

## 2019-04-21 DIAGNOSIS — I498 Other specified cardiac arrhythmias: Secondary | ICD-10-CM | POA: Insufficient documentation

## 2019-04-21 HISTORY — DX: Other specified postprocedural states: Z98.890

## 2019-04-21 HISTORY — DX: Other complications of anesthesia, initial encounter: T88.59XA

## 2019-04-21 HISTORY — DX: Other specified postprocedural states: R11.2

## 2019-04-21 LAB — CBC WITH DIFFERENTIAL/PLATELET
Abs Immature Granulocytes: 0.17 10*3/uL — ABNORMAL HIGH (ref 0.00–0.07)
Basophils Absolute: 0.1 10*3/uL (ref 0.0–0.1)
Basophils Relative: 1 %
Eosinophils Absolute: 0.1 10*3/uL (ref 0.0–0.5)
Eosinophils Relative: 1 %
HCT: 51.3 % — ABNORMAL HIGH (ref 36.0–46.0)
Hemoglobin: 16.9 g/dL — ABNORMAL HIGH (ref 12.0–15.0)
Immature Granulocytes: 1 %
Lymphocytes Relative: 16 %
Lymphs Abs: 2 10*3/uL (ref 0.7–4.0)
MCH: 32.1 pg (ref 26.0–34.0)
MCHC: 32.9 g/dL (ref 30.0–36.0)
MCV: 97.5 fL (ref 80.0–100.0)
Monocytes Absolute: 1.1 10*3/uL — ABNORMAL HIGH (ref 0.1–1.0)
Monocytes Relative: 9 %
Neutro Abs: 8.7 10*3/uL — ABNORMAL HIGH (ref 1.7–7.7)
Neutrophils Relative %: 72 %
Platelets: 313 10*3/uL (ref 150–400)
RBC: 5.26 MIL/uL — ABNORMAL HIGH (ref 3.87–5.11)
RDW: 13.2 % (ref 11.5–15.5)
WBC: 12.2 10*3/uL — ABNORMAL HIGH (ref 4.0–10.5)
nRBC: 0 % (ref 0.0–0.2)

## 2019-04-21 LAB — BASIC METABOLIC PANEL
Anion gap: 13 (ref 5–15)
BUN: 23 mg/dL (ref 8–23)
CO2: 25 mmol/L (ref 22–32)
Calcium: 9.7 mg/dL (ref 8.9–10.3)
Chloride: 102 mmol/L (ref 98–111)
Creatinine, Ser: 0.93 mg/dL (ref 0.44–1.00)
GFR calc Af Amer: >60 mL/min (ref >60)
GFR calc non Af Amer: >60 mL/min (ref >60)
Glucose, Bld: 90 mg/dL (ref 70–99)
Potassium: 4.1 mmol/L (ref 3.5–5.1)
Sodium: 140 mmol/L (ref 135–145)

## 2019-04-21 LAB — PROTIME-INR
INR: 1 (ref 0.8–1.2)
Prothrombin Time: 12.6 seconds (ref 11.4–15.2)

## 2019-04-21 LAB — TYPE AND SCREEN
ABO/RH(D): O NEG
Antibody Screen: NEGATIVE

## 2019-04-21 LAB — SURGICAL PCR SCREEN
MRSA, PCR: NEGATIVE
Staphylococcus aureus: NEGATIVE

## 2019-04-21 LAB — ABO/RH: ABO/RH(D): O NEG

## 2019-04-21 NOTE — Progress Notes (Addendum)
PCP - Marrian Salvage, FNP Cardiologist - pt denies  Chest x-ray - 04/21/2019 at PAT appt EKG - 04/21/2019 at PAT appt  Stress Test - n/a ECHO - n/a  Cardiac Cath -  n/a  Sleep Study - n/a CPAP - n/a  Fasting Blood Sugar - n/a Checks Blood Sugar _____ times a day-n/a  Blood Thinner Instructions: n/a Aspirin Instructions: Aspirin 81 mg 04/16/2019  Anesthesia review: pending labs  Patient denies shortness of breath, fever, cough and chest pain at PAT appointment  Patient verbalized understanding of instructions that were given to them at the PAT appointment. Patient was also instructed that they will need to review over the PAT instructions again at home before surgery.   Coronavirus Screening  Have you experienced the following symptoms:  Cough yes/no: No Fever (>100.22F)  yes/no: No Runny nose yes/no: No Sore throat yes/no: No Difficulty breathing/shortness of breath  yes/no: No  Have you or a family member traveled in the last 14 days and where? yes/no: No   If the patient indicates "YES" to the above questions, their PAT will be rescheduled to limit the exposure to others and, the surgeon will be notified. THE PATIENT WILL NEED TO BE ASYMPTOMATIC FOR 14 DAYS.   If the patient is not experiencing any of these symptoms, the PAT nurse will instruct them to NOT bring anyone with them to their appointment since they may have these symptoms or traveled as well.   Please remind your patients and families that hospital visitation restrictions are in effect and the importance of the restrictions.

## 2019-04-22 ENCOUNTER — Other Ambulatory Visit: Payer: Self-pay | Admitting: Family

## 2019-04-22 LAB — SARS CORONAVIRUS 2 (TAT 6-24 HRS): SARS Coronavirus 2: NEGATIVE

## 2019-04-22 MED ORDER — AMLODIPINE BESYLATE 5 MG PO TABS: 5.0000 mg | ORAL_TABLET | Freq: Every day | ORAL | 1 refills | Status: DC

## 2019-04-22 NOTE — Anesthesia Preprocedure Evaluation (Addendum)
Anesthesia Evaluation  Patient identified by MRN, date of birth, ID band Patient awake    Reviewed: Allergy & Precautions, H&P , NPO status , Patient's Chart, lab work & pertinent test results  History of Anesthesia Complications (+) PONV  Airway Mallampati: II  TM Distance: >3 FB Neck ROM: Full    Dental no notable dental hx. (+) Teeth Intact   Pulmonary neg pulmonary ROS, former smoker,    Pulmonary exam normal breath sounds clear to auscultation       Cardiovascular Exercise Tolerance: Good hypertension, Pt. on medications negative cardio ROS Normal cardiovascular exam Rhythm:Regular Rate:Normal     Neuro/Psych Depression negative neurological ROS     GI/Hepatic negative GI ROS, Neg liver ROS,   Endo/Other  Hypothyroidism   Renal/GU negative Renal ROS  negative genitourinary   Musculoskeletal  (+) Arthritis ,   Abdominal   Peds  Hematology negative hematology ROS (+)   Anesthesia Other Findings   Reproductive/Obstetrics                           Lab Results  Component Value Date   WBC 12.2 (H) 04/21/2019   HGB 16.9 (H) 04/21/2019   HCT 51.3 (H) 04/21/2019   MCV 97.5 04/21/2019   PLT 313 04/21/2019   Lab Results  Component Value Date   CREATININE 0.93 04/21/2019   BUN 23 04/21/2019   NA 140 04/21/2019   K 4.1 04/21/2019   CL 102 04/21/2019   CO2 25 04/21/2019    Anesthesia Physical Anesthesia Plan  ASA: III  Anesthesia Plan: General   Post-op Pain Management:    Induction: Intravenous  PONV Risk Score and Plan: 4 or greater and Treatment may vary due to age or medical condition, Ondansetron and Dexamethasone  Airway Management Planned: Oral ETT  Additional Equipment:   Intra-op Plan:   Post-operative Plan: Extubation in OR  Informed Consent: I have reviewed the patients History and Physical, chart, labs and discussed the procedure including the risks,  benefits and alternatives for the proposed anesthesia with the patient or authorized representative who has indicated his/her understanding and acceptance.     Dental advisory given  Plan Discussed with:   Anesthesia Plan Comments: (Past diagnosis of heart murmur. I spoke with patient to clarify. She says she was told by Dr. Linna Darner many years ago that she had a faint murmur. Dr. Linna Darner has since retired and pt has established with several different PCPs in the interim and none have appreciated a murmur. I reviewed Dr. Clayborn Heron notes from 2013 and he did document a grade 1/2 out of 6 systolic murmur. Recent PCP notes have documented no murmur. Pt denies every having any CV symptoms. She is currently limited by her back pain but otherwise is able to go up multiple flights of stairs without issue. She has never had any advanced cardiac testing. EKG shows NSR. Denies any CV symptoms, no DOE, CP, Edema.  Pt also reports hx of severe PONV.)      Anesthesia Quick Evaluation

## 2019-04-23 ENCOUNTER — Encounter: Payer: Self-pay | Admitting: Family

## 2019-04-23 ENCOUNTER — Inpatient Hospital Stay (HOSPITAL_COMMUNITY): Payer: Medicare Other

## 2019-04-23 ENCOUNTER — Encounter (HOSPITAL_COMMUNITY)
Admission: RE | Disposition: A | Discharge status: Home / Self Care | Payer: Self-pay | Source: Home / Self Care | Attending: Neurological Surgery

## 2019-04-23 ENCOUNTER — Inpatient Hospital Stay (HOSPITAL_COMMUNITY)
Admission: RE | Admit: 2019-04-23 | Discharge: 2019-04-24 | DRG: 455 | Disposition: A | Discharge status: Home / Self Care | Payer: Medicare Other | Attending: Neurological Surgery | Admitting: Neurological Surgery

## 2019-04-23 ENCOUNTER — Encounter (HOSPITAL_COMMUNITY): Payer: Self-pay | Admitting: Anesthesiology

## 2019-04-23 ENCOUNTER — Inpatient Hospital Stay (HOSPITAL_COMMUNITY): Payer: Medicare Other | Admitting: Physician Assistant

## 2019-04-23 ENCOUNTER — Inpatient Hospital Stay (HOSPITAL_COMMUNITY): Payer: Medicare Other | Admitting: Anesthesiology

## 2019-04-23 ENCOUNTER — Other Ambulatory Visit: Payer: Self-pay

## 2019-04-23 DIAGNOSIS — E890 Postprocedural hypothyroidism: Secondary | ICD-10-CM | POA: Diagnosis present

## 2019-04-23 DIAGNOSIS — Z881 Allergy status to other antibiotic agents status: Secondary | ICD-10-CM

## 2019-04-23 DIAGNOSIS — Z905 Acquired absence of kidney: Secondary | ICD-10-CM | POA: Diagnosis not present

## 2019-04-23 DIAGNOSIS — Z20828 Contact with and (suspected) exposure to other viral communicable diseases: Secondary | ICD-10-CM | POA: Diagnosis present

## 2019-04-23 DIAGNOSIS — Z7982 Long term (current) use of aspirin: Secondary | ICD-10-CM | POA: Diagnosis not present

## 2019-04-23 DIAGNOSIS — Z90710 Acquired absence of both cervix and uterus: Secondary | ICD-10-CM | POA: Diagnosis not present

## 2019-04-23 DIAGNOSIS — Z90722 Acquired absence of ovaries, bilateral: Secondary | ICD-10-CM | POA: Diagnosis not present

## 2019-04-23 DIAGNOSIS — Z79890 Hormone replacement therapy (postmenopausal): Secondary | ICD-10-CM

## 2019-04-23 DIAGNOSIS — R011 Cardiac murmur, unspecified: Secondary | ICD-10-CM | POA: Diagnosis present

## 2019-04-23 DIAGNOSIS — Z87891 Personal history of nicotine dependence: Secondary | ICD-10-CM | POA: Diagnosis not present

## 2019-04-23 DIAGNOSIS — Z79899 Other long term (current) drug therapy: Secondary | ICD-10-CM | POA: Diagnosis not present

## 2019-04-23 DIAGNOSIS — Z885 Allergy status to narcotic agent status: Secondary | ICD-10-CM

## 2019-04-23 DIAGNOSIS — Z8349 Family history of other endocrine, nutritional and metabolic diseases: Secondary | ICD-10-CM | POA: Diagnosis not present

## 2019-04-23 DIAGNOSIS — M48061 Spinal stenosis, lumbar region without neurogenic claudication: Principal | ICD-10-CM | POA: Diagnosis present

## 2019-04-23 DIAGNOSIS — M1990 Unspecified osteoarthritis, unspecified site: Secondary | ICD-10-CM | POA: Diagnosis present

## 2019-04-23 DIAGNOSIS — Z419 Encounter for procedure for purposes other than remedying health state, unspecified: Secondary | ICD-10-CM

## 2019-04-23 DIAGNOSIS — Z981 Arthrodesis status: Secondary | ICD-10-CM

## 2019-04-23 DIAGNOSIS — E785 Hyperlipidemia, unspecified: Secondary | ICD-10-CM | POA: Diagnosis present

## 2019-04-23 DIAGNOSIS — I10 Essential (primary) hypertension: Secondary | ICD-10-CM | POA: Diagnosis present

## 2019-04-23 DIAGNOSIS — Z8249 Family history of ischemic heart disease and other diseases of the circulatory system: Secondary | ICD-10-CM | POA: Diagnosis not present

## 2019-04-23 DIAGNOSIS — Z86010 Personal history of colonic polyps: Secondary | ICD-10-CM

## 2019-04-23 DIAGNOSIS — M4316 Spondylolisthesis, lumbar region: Secondary | ICD-10-CM | POA: Diagnosis present

## 2019-04-23 DIAGNOSIS — M545 Low back pain: Secondary | ICD-10-CM | POA: Diagnosis present

## 2019-04-23 SURGERY — POSTERIOR LUMBAR FUSION 1 LEVEL
Anesthesia: General | Site: Back

## 2019-04-23 MED ORDER — SODIUM CHLORIDE 0.9% FLUSH: 3.0000 mL | INTRAVENOUS | Status: DC | PRN

## 2019-04-23 MED ORDER — ACETAMINOPHEN 650 MG RE SUPP: 650.0000 mg | RECTAL | Status: DC | PRN

## 2019-04-23 MED ORDER — ACETAMINOPHEN 10 MG/ML IV SOLN
INTRAVENOUS | Status: DC | PRN
  Administered 2019-04-23: 1000 mg via INTRAVENOUS

## 2019-04-23 MED ORDER — SODIUM CHLORIDE (PF) 0.9 % IJ SOLN
INTRAMUSCULAR | Status: DC | PRN
  Administered 2019-04-23: 5 mL

## 2019-04-23 MED ORDER — VANCOMYCIN HCL 1000 MG IV SOLR
INTRAVENOUS | Status: DC | PRN
  Administered 2019-04-23: 1000 mg via TOPICAL

## 2019-04-23 MED ORDER — METHOCARBAMOL 1000 MG/10ML IJ SOLN
500.0000 mg | Freq: Four times a day (QID) | INTRAVENOUS | Status: DC | PRN
  Filled 2019-04-23: qty 5

## 2019-04-23 MED ORDER — PROPOFOL 10 MG/ML IV BOLUS
INTRAVENOUS | Status: DC | PRN
  Administered 2019-04-23: 130 mg via INTRAVENOUS

## 2019-04-23 MED ORDER — ONDANSETRON HCL 4 MG/2ML IJ SOLN
INTRAMUSCULAR | Status: AC
  Filled 2019-04-23: qty 2

## 2019-04-23 MED ORDER — MIDAZOLAM HCL 2 MG/2ML IJ SOLN
INTRAMUSCULAR | Status: AC
  Filled 2019-04-23: qty 2

## 2019-04-23 MED ORDER — DEXAMETHASONE SODIUM PHOSPHATE 10 MG/ML IJ SOLN: 10.0000 mg | Freq: Once | INTRAMUSCULAR | Status: DC

## 2019-04-23 MED ORDER — THROMBIN 20000 UNITS EX SOLR
CUTANEOUS | Status: AC
  Filled 2019-04-23: qty 20000

## 2019-04-23 MED ORDER — SODIUM CHLORIDE 0.9 % IV SOLN
INTRAVENOUS | Status: DC | PRN
  Administered 2019-04-23: 500 mL

## 2019-04-23 MED ORDER — METOPROLOL SUCCINATE ER 50 MG PO TB24: 50.0000 mg | ORAL_TABLET | Freq: Every day | ORAL | Status: DC

## 2019-04-23 MED ORDER — SUCCINYLCHOLINE CHLORIDE 200 MG/10ML IV SOSY
PREFILLED_SYRINGE | INTRAVENOUS | Status: AC
  Filled 2019-04-23: qty 10

## 2019-04-23 MED ORDER — PHENOL 1.4 % MT LIQD: 1.0000 | OROMUCOSAL | Status: DC | PRN

## 2019-04-23 MED ORDER — HYDROMORPHONE HCL 1 MG/ML IJ SOLN
0.5000 mg | INTRAMUSCULAR | Status: DC | PRN
  Administered 2019-04-23 (×2): 0.5 mg via INTRAVENOUS

## 2019-04-23 MED ORDER — DEXAMETHASONE 4 MG PO TABS
4.0000 mg | ORAL_TABLET | Freq: Four times a day (QID) | ORAL | Status: DC
  Administered 2019-04-23 – 2019-04-24 (×2): 4 mg via ORAL
  Filled 2019-04-23 (×2): qty 1

## 2019-04-23 MED ORDER — ACETAMINOPHEN 10 MG/ML IV SOLN
INTRAVENOUS | Status: AC
  Filled 2019-04-23: qty 100

## 2019-04-23 MED ORDER — 0.9 % SODIUM CHLORIDE (POUR BTL) OPTIME
TOPICAL | Status: DC | PRN
  Administered 2019-04-23: 16:00:00 1000 mL

## 2019-04-23 MED ORDER — MORPHINE SULFATE (PF) 2 MG/ML IV SOLN: 2.0000 mg | INTRAVENOUS | Status: DC | PRN

## 2019-04-23 MED ORDER — LACTATED RINGERS IV SOLN
INTRAVENOUS | Status: DC
  Administered 2019-04-23 (×2): via INTRAVENOUS

## 2019-04-23 MED ORDER — SODIUM CHLORIDE 0.9 % IV SOLN: 250.0000 mL | INTRAVENOUS | Status: DC

## 2019-04-23 MED ORDER — DEXAMETHASONE SODIUM PHOSPHATE 10 MG/ML IJ SOLN
INTRAMUSCULAR | Status: AC
  Filled 2019-04-23: qty 1

## 2019-04-23 MED ORDER — CHLORHEXIDINE GLUCONATE CLOTH 2 % EX PADS: 6.0000 | MEDICATED_PAD | Freq: Once | CUTANEOUS | Status: DC

## 2019-04-23 MED ORDER — LIDOCAINE 2% (20 MG/ML) 5 ML SYRINGE
INTRAMUSCULAR | Status: AC
  Filled 2019-04-23: qty 5

## 2019-04-23 MED ORDER — MIDAZOLAM HCL 5 MG/5ML IJ SOLN
INTRAMUSCULAR | Status: DC | PRN
  Administered 2019-04-23: .5 mg via INTRAVENOUS

## 2019-04-23 MED ORDER — ONDANSETRON HCL 4 MG/2ML IJ SOLN
INTRAMUSCULAR | Status: DC | PRN
  Administered 2019-04-23: 4 mg via INTRAVENOUS

## 2019-04-23 MED ORDER — LIDOCAINE 2% (20 MG/ML) 5 ML SYRINGE
INTRAMUSCULAR | Status: DC | PRN
  Administered 2019-04-23: 100 mg via INTRAVENOUS

## 2019-04-23 MED ORDER — OXYCODONE HCL 5 MG PO TABS
5.0000 mg | ORAL_TABLET | ORAL | Status: DC | PRN
  Administered 2019-04-23: 5 mg via ORAL
  Filled 2019-04-23: qty 1

## 2019-04-23 MED ORDER — SENNA 8.6 MG PO TABS
1.0000 | ORAL_TABLET | Freq: Two times a day (BID) | ORAL | Status: DC
  Administered 2019-04-23: 22:00:00 8.6 mg via ORAL
  Filled 2019-04-23: qty 1

## 2019-04-23 MED ORDER — POTASSIUM CHLORIDE IN NACL 20-0.9 MEQ/L-% IV SOLN: INTRAVENOUS | Status: DC

## 2019-04-23 MED ORDER — GABAPENTIN 100 MG PO CAPS
200.0000 mg | ORAL_CAPSULE | Freq: Every day | ORAL | Status: DC
  Administered 2019-04-23: 22:00:00 200 mg via ORAL
  Filled 2019-04-23: qty 2

## 2019-04-23 MED ORDER — HEPARIN SODIUM (PORCINE) 1000 UNIT/ML IJ SOLN
INTRAMUSCULAR | Status: AC
  Filled 2019-04-23: qty 1

## 2019-04-23 MED ORDER — PHENYLEPHRINE 40 MCG/ML (10ML) SYRINGE FOR IV PUSH (FOR BLOOD PRESSURE SUPPORT)
PREFILLED_SYRINGE | INTRAVENOUS | Status: AC
  Filled 2019-04-23: qty 10

## 2019-04-23 MED ORDER — CEFAZOLIN SODIUM-DEXTROSE 2-4 GM/100ML-% IV SOLN
INTRAVENOUS | Status: AC
  Filled 2019-04-23: qty 100

## 2019-04-23 MED ORDER — PHENYLEPHRINE 40 MCG/ML (10ML) SYRINGE FOR IV PUSH (FOR BLOOD PRESSURE SUPPORT)
PREFILLED_SYRINGE | INTRAVENOUS | Status: DC | PRN
  Administered 2019-04-23: 80 ug via INTRAVENOUS
  Administered 2019-04-23: 40 ug via INTRAVENOUS
  Administered 2019-04-23: 80 ug via INTRAVENOUS

## 2019-04-23 MED ORDER — CEFAZOLIN SODIUM-DEXTROSE 2-4 GM/100ML-% IV SOLN
2.0000 g | Freq: Three times a day (TID) | INTRAVENOUS | Status: AC
  Administered 2019-04-23 – 2019-04-24 (×2): 2 g via INTRAVENOUS
  Filled 2019-04-23 (×2): qty 100

## 2019-04-23 MED ORDER — DEXAMETHASONE SODIUM PHOSPHATE 4 MG/ML IJ SOLN
INTRAMUSCULAR | Status: DC | PRN
  Administered 2019-04-23: 10 mg via INTRAVENOUS

## 2019-04-23 MED ORDER — HEPARIN SODIUM (PORCINE) 1000 UNIT/ML IJ SOLN
INTRAMUSCULAR | Status: DC | PRN
  Administered 2019-04-23: 5000 [IU]

## 2019-04-23 MED ORDER — ACETAMINOPHEN 325 MG PO TABS: 650.0000 mg | ORAL_TABLET | ORAL | Status: DC | PRN

## 2019-04-23 MED ORDER — BUPIVACAINE HCL (PF) 0.25 % IJ SOLN
INTRAMUSCULAR | Status: AC
  Filled 2019-04-23: qty 30

## 2019-04-23 MED ORDER — LEVOTHYROXINE SODIUM 75 MCG PO TABS
75.0000 ug | ORAL_TABLET | Freq: Every day | ORAL | Status: DC
  Administered 2019-04-24: 05:00:00 75 ug via ORAL
  Filled 2019-04-23: qty 1

## 2019-04-23 MED ORDER — BUPIVACAINE HCL (PF) 0.25 % IJ SOLN
INTRAMUSCULAR | Status: DC | PRN
  Administered 2019-04-23: 6 mL

## 2019-04-23 MED ORDER — ONDANSETRON HCL 4 MG/2ML IJ SOLN: 4.0000 mg | Freq: Four times a day (QID) | INTRAMUSCULAR | Status: DC | PRN

## 2019-04-23 MED ORDER — SODIUM CHLORIDE 0.9 % IV SOLN
INTRAVENOUS | Status: DC | PRN
  Administered 2019-04-23: 16:00:00 25 ug/min via INTRAVENOUS

## 2019-04-23 MED ORDER — ARTHREX ANGEL - ACD-A SOLUTION (CHARTING ONLY) OPTIME
TOPICAL | Status: DC | PRN
  Administered 2019-04-23: 12 mL via TOPICAL

## 2019-04-23 MED ORDER — ASPIRIN EC 81 MG PO TBEC
81.0000 mg | DELAYED_RELEASE_TABLET | Freq: Every day | ORAL | Status: DC
  Administered 2019-04-23: 22:00:00 81 mg via ORAL
  Filled 2019-04-23 (×3): qty 1

## 2019-04-23 MED ORDER — EPHEDRINE SULFATE-NACL 50-0.9 MG/10ML-% IV SOSY
PREFILLED_SYRINGE | INTRAVENOUS | Status: DC | PRN
  Administered 2019-04-23: 10 mg via INTRAVENOUS
  Administered 2019-04-23: 5 mg via INTRAVENOUS

## 2019-04-23 MED ORDER — CEFAZOLIN SODIUM-DEXTROSE 2-4 GM/100ML-% IV SOLN
2.0000 g | INTRAVENOUS | Status: AC
  Administered 2019-04-23: 2 g via INTRAVENOUS

## 2019-04-23 MED ORDER — BENAZEPRIL HCL 5 MG PO TABS
30.0000 mg | ORAL_TABLET | Freq: Every day | ORAL | Status: DC
  Administered 2019-04-23: 30 mg via ORAL
  Filled 2019-04-23 (×2): qty 2
  Filled 2019-04-23: qty 1

## 2019-04-23 MED ORDER — FENTANYL CITRATE (PF) 100 MCG/2ML IJ SOLN
INTRAMUSCULAR | Status: DC | PRN
  Administered 2019-04-23: 50 ug via INTRAVENOUS
  Administered 2019-04-23: 100 ug via INTRAVENOUS
  Administered 2019-04-23: 50 ug via INTRAVENOUS

## 2019-04-23 MED ORDER — MENTHOL 3 MG MT LOZG: 1.0000 | LOZENGE | OROMUCOSAL | Status: DC | PRN

## 2019-04-23 MED ORDER — DEXMEDETOMIDINE HCL IN NACL 200 MCG/50ML IV SOLN
INTRAVENOUS | Status: DC | PRN
  Administered 2019-04-23 (×3): 8 ug via INTRAVENOUS

## 2019-04-23 MED ORDER — THROMBIN 5000 UNITS EX SOLR
CUTANEOUS | Status: AC
  Filled 2019-04-23: qty 5000

## 2019-04-23 MED ORDER — HYDROMORPHONE HCL 1 MG/ML IJ SOLN
INTRAMUSCULAR | Status: AC
  Filled 2019-04-23: qty 1

## 2019-04-23 MED ORDER — THROMBIN 20000 UNITS EX SOLR
CUTANEOUS | Status: DC | PRN
  Administered 2019-04-23: 20 mL via TOPICAL

## 2019-04-23 MED ORDER — THROMBIN 5000 UNITS EX SOLR
OROMUCOSAL | Status: DC | PRN
  Administered 2019-04-23: 5 mL via TOPICAL

## 2019-04-23 MED ORDER — CELECOXIB 200 MG PO CAPS
200.0000 mg | ORAL_CAPSULE | Freq: Two times a day (BID) | ORAL | Status: DC
  Administered 2019-04-23: 200 mg via ORAL
  Filled 2019-04-23: qty 1

## 2019-04-23 MED ORDER — ROCURONIUM BROMIDE 10 MG/ML (PF) SYRINGE
PREFILLED_SYRINGE | INTRAVENOUS | Status: AC
  Filled 2019-04-23: qty 10

## 2019-04-23 MED ORDER — SUGAMMADEX SODIUM 200 MG/2ML IV SOLN
INTRAVENOUS | Status: DC | PRN
  Administered 2019-04-23: 200 mg via INTRAVENOUS

## 2019-04-23 MED ORDER — ONDANSETRON HCL 4 MG PO TABS
4.0000 mg | ORAL_TABLET | Freq: Four times a day (QID) | ORAL | Status: DC | PRN
  Administered 2019-04-23: 22:00:00 4 mg via ORAL
  Filled 2019-04-23: qty 1

## 2019-04-23 MED ORDER — ROCURONIUM BROMIDE 10 MG/ML (PF) SYRINGE
PREFILLED_SYRINGE | INTRAVENOUS | Status: DC | PRN
  Administered 2019-04-23 (×2): 10 mg via INTRAVENOUS
  Administered 2019-04-23: 50 mg via INTRAVENOUS

## 2019-04-23 MED ORDER — AMLODIPINE BESYLATE 5 MG PO TABS: 5.0000 mg | ORAL_TABLET | Freq: Every day | ORAL | Status: DC

## 2019-04-23 MED ORDER — POTASSIUM CITRATE ER 10 MEQ (1080 MG) PO TBCR
10.0000 meq | EXTENDED_RELEASE_TABLET | Freq: Every day | ORAL | Status: DC
  Filled 2019-04-23 (×3): qty 1

## 2019-04-23 MED ORDER — EPHEDRINE 5 MG/ML INJ
INTRAVENOUS | Status: AC
  Filled 2019-04-23: qty 10

## 2019-04-23 MED ORDER — PROMETHAZINE HCL 25 MG/ML IJ SOLN
INTRAMUSCULAR | Status: AC
  Filled 2019-04-23: qty 1

## 2019-04-23 MED ORDER — METHOCARBAMOL 500 MG PO TABS
500.0000 mg | ORAL_TABLET | Freq: Four times a day (QID) | ORAL | Status: DC | PRN
  Administered 2019-04-23: 22:00:00 500 mg via ORAL
  Filled 2019-04-23: qty 1

## 2019-04-23 MED ORDER — PROMETHAZINE HCL 25 MG/ML IJ SOLN
6.2500 mg | Freq: Once | INTRAMUSCULAR | Status: AC
  Administered 2019-04-23: 20:00:00 6.25 mg via INTRAVENOUS

## 2019-04-23 MED ORDER — FENTANYL CITRATE (PF) 250 MCG/5ML IJ SOLN
INTRAMUSCULAR | Status: AC
  Filled 2019-04-23: qty 5

## 2019-04-23 MED ORDER — DEXAMETHASONE SODIUM PHOSPHATE 4 MG/ML IJ SOLN: 4.0000 mg | Freq: Four times a day (QID) | INTRAMUSCULAR | Status: DC

## 2019-04-23 MED ORDER — PROPOFOL 10 MG/ML IV BOLUS
INTRAVENOUS | Status: AC
  Filled 2019-04-23: qty 20

## 2019-04-23 MED ORDER — VANCOMYCIN HCL 1000 MG IV SOLR
INTRAVENOUS | Status: AC
  Filled 2019-04-23: qty 1000

## 2019-04-23 MED ORDER — SODIUM CHLORIDE 0.9% FLUSH: 3.0000 mL | Freq: Two times a day (BID) | INTRAVENOUS | Status: DC

## 2019-04-23 SURGICAL SUPPLY — 67 items
ADH SKN CLS APL DERMABOND .7 (GAUZE/BANDAGES/DRESSINGS) ×1
APL SKNCLS STERI-STRIP NONHPOA (GAUZE/BANDAGES/DRESSINGS) ×1
BAG DECANTER FOR FLEXI CONT (MISCELLANEOUS) ×3 IMPLANT
BASKET BONE COLLECTION (BASKET) ×3 IMPLANT
BENZOIN TINCTURE PRP APPL 2/3 (GAUZE/BANDAGES/DRESSINGS) ×3 IMPLANT
BLADE CLIPPER SURG (BLADE) IMPLANT
BUR MATCHSTICK NEURO 3.0 LAGG (BURR) ×3 IMPLANT
CANISTER SUCT 3000ML PPV (MISCELLANEOUS) ×3 IMPLANT
CARTRIDGE OIL MAESTRO DRILL (MISCELLANEOUS) ×1 IMPLANT
CLOSURE WOUND 1/2 X4 (GAUZE/BANDAGES/DRESSINGS) ×1
CONT SPEC 4OZ CLIKSEAL STRL BL (MISCELLANEOUS) ×3 IMPLANT
COVER BACK TABLE 60X90IN (DRAPES) ×3 IMPLANT
COVER WAND RF STERILE (DRAPES) ×3 IMPLANT
DERMABOND ADVANCED (GAUZE/BANDAGES/DRESSINGS) ×2
DERMABOND ADVANCED .7 DNX12 (GAUZE/BANDAGES/DRESSINGS) ×1 IMPLANT
DIFFUSER DRILL AIR PNEUMATIC (MISCELLANEOUS) ×3 IMPLANT
DRAPE C-ARM 42X72 X-RAY (DRAPES) ×4 IMPLANT
DRAPE C-ARMOR (DRAPES) ×2 IMPLANT
DRAPE LAPAROTOMY 100X72X124 (DRAPES) ×3 IMPLANT
DRAPE POUCH INSTRU U-SHP 10X18 (DRAPES) ×3 IMPLANT
DRAPE SURG 17X23 STRL (DRAPES) ×3 IMPLANT
DURAPREP 26ML APPLICATOR (WOUND CARE) ×3 IMPLANT
ELECT REM PT RETURN 9FT ADLT (ELECTROSURGICAL) ×3
ELECTRODE REM PT RTRN 9FT ADLT (ELECTROSURGICAL) ×1 IMPLANT
EVACUATOR 1/8 PVC DRAIN (DRAIN) ×1 IMPLANT
GAUZE 4X4 16PLY RFD (DISPOSABLE) IMPLANT
GLOVE BIO SURGEON STRL SZ7 (GLOVE) IMPLANT
GLOVE BIO SURGEON STRL SZ8 (GLOVE) ×6 IMPLANT
GLOVE BIOGEL PI IND STRL 7.0 (GLOVE) IMPLANT
GLOVE BIOGEL PI IND STRL 7.5 (GLOVE) IMPLANT
GLOVE BIOGEL PI INDICATOR 7.0 (GLOVE) ×6
GLOVE BIOGEL PI INDICATOR 7.5 (GLOVE) ×6
GLOVE SURG SS PI 7.0 STRL IVOR (GLOVE) ×10 IMPLANT
GOWN STRL REUS W/ TWL LRG LVL3 (GOWN DISPOSABLE) IMPLANT
GOWN STRL REUS W/ TWL XL LVL3 (GOWN DISPOSABLE) ×2 IMPLANT
GOWN STRL REUS W/TWL 2XL LVL3 (GOWN DISPOSABLE) IMPLANT
GOWN STRL REUS W/TWL LRG LVL3 (GOWN DISPOSABLE) ×3
GOWN STRL REUS W/TWL XL LVL3 (GOWN DISPOSABLE) ×12
HEMOSTAT POWDER KIT SURGIFOAM (HEMOSTASIS) ×2 IMPLANT
KIT BASIN OR (CUSTOM PROCEDURE TRAY) ×3 IMPLANT
KIT BONE MRW ASP ANGEL CPRP (KITS) ×2 IMPLANT
KIT TURNOVER KIT B (KITS) ×3 IMPLANT
MILL MEDIUM DISP (BLADE) ×3 IMPLANT
NDL HYPO 25X1 1.5 SAFETY (NEEDLE) ×1 IMPLANT
NEEDLE HYPO 25X1 1.5 SAFETY (NEEDLE) ×3 IMPLANT
NS IRRIG 1000ML POUR BTL (IV SOLUTION) ×3 IMPLANT
OIL CARTRIDGE MAESTRO DRILL (MISCELLANEOUS) ×3
PACK LAMINECTOMY NEURO (CUSTOM PROCEDURE TRAY) ×3 IMPLANT
PAD ARMBOARD 7.5X6 YLW CONV (MISCELLANEOUS) ×9 IMPLANT
PUTTY DBM ALLOSYNC PURE 10CC (Putty) ×2 IMPLANT
ROD LORD LIPPED TI 5.5X35 (Rod) ×4 IMPLANT
SCREW POLYAXIAL TULIP (Screw) ×8 IMPLANT
SCREW SHANK MOD 5.5X40 (Screw) ×8 IMPLANT
SET SCREW (Screw) ×12 IMPLANT
SET SCREW SPNE (Screw) IMPLANT
SPACER IDENTITI PS 10X9X25 15D (Spacer) ×4 IMPLANT
SPONGE LAP 4X18 RFD (DISPOSABLE) IMPLANT
SPONGE SURGIFOAM ABS GEL 100 (HEMOSTASIS) ×3 IMPLANT
STRIP CLOSURE SKIN 1/2X4 (GAUZE/BANDAGES/DRESSINGS) ×3 IMPLANT
SUT VIC AB 0 CT1 18XCR BRD8 (SUTURE) ×1 IMPLANT
SUT VIC AB 0 CT1 8-18 (SUTURE) ×3
SUT VIC AB 2-0 CP2 18 (SUTURE) ×3 IMPLANT
SUT VIC AB 3-0 SH 8-18 (SUTURE) ×6 IMPLANT
TOWEL GREEN STERILE (TOWEL DISPOSABLE) ×3 IMPLANT
TOWEL GREEN STERILE FF (TOWEL DISPOSABLE) ×3 IMPLANT
TRAY FOLEY MTR SLVR 16FR STAT (SET/KITS/TRAYS/PACK) ×3 IMPLANT
WATER STERILE IRR 1000ML POUR (IV SOLUTION) ×3 IMPLANT

## 2019-04-23 NOTE — Anesthesia Postprocedure Evaluation (Signed)
Anesthesia Post Note  Patient: [name]  Procedure(s) Performed: POSTERIOR LUMBAR INTERBODY FUSION  LUMBAR FOUR-LUMBAR FIVE (N/A Back)     Patient location during evaluation: PACU Anesthesia Type: General Level of consciousness: awake and alert, patient cooperative and oriented Pain management: pain level controlled Vital Signs Assessment: post-procedure vital signs reviewed and stable Respiratory status: spontaneous breathing, nonlabored ventilation, respiratory function stable and patient connected to nasal cannula oxygen Cardiovascular status: blood pressure returned to baseline and stable Postop Assessment: no apparent nausea or vomiting Anesthetic complications: no    Last Vitals:  Vitals:   04/23/19 1206  BP: (!) 152/68  Pulse: 73  Resp: 18  Temp: 36.7 C  SpO2: 100%    Last Pain:  Vitals:   04/23/19 1231  TempSrc:   PainSc: 0-No pain                 Iesha Summerhill,E. Harley Fitzwater

## 2019-04-23 NOTE — Progress Notes (Signed)
Back brace taken up to the room.

## 2019-04-23 NOTE — Anesthesia Procedure Notes (Signed)
Procedure Name: Intubation Date/Time: 04/23/2019 3:35 PM Performed by: Candis Shine, CRNA Pre-anesthesia Checklist: Patient identified, Emergency Drugs available, Suction available and Patient being monitored Patient Re-evaluated:Patient Re-evaluated prior to induction Oxygen Delivery Method: Circle System Utilized Preoxygenation: Pre-oxygenation with 100% oxygen Induction Type: IV induction Ventilation: Mask ventilation without difficulty and Oral airway inserted - appropriate to patient size Laryngoscope Size: Mac and 3 Grade View: Grade II Tube type: Oral Tube size: 7.0 mm Number of attempts: 1 Airway Equipment and Method: Stylet and Oral airway Placement Confirmation: ETT inserted through vocal cords under direct vision,  positive ETCO2 and breath sounds checked- equal and bilateral Secured at: 22 cm Tube secured with: Tape Dental Injury: Teeth and Oropharynx as per pre-operative assessment

## 2019-04-23 NOTE — Transfer of Care (Signed)
Immediate Anesthesia Transfer of Care Note  Patient: [name]  Procedure(s) Performed: POSTERIOR LUMBAR INTERBODY FUSION  LUMBAR FOUR-LUMBAR FIVE (N/A Back)  Patient Location: PACU  Anesthesia Type:General  Level of Consciousness: awake, alert  and oriented  Airway & Oxygen Therapy: Patient Spontanous Breathing and Patient connected to nasal cannula oxygen  Post-op Assessment: Report given to RN and Post -op Vital signs reviewed and stable  Post vital signs: Reviewed and stable  Last Vitals:  Vitals Value Taken Time  BP 156/81 04/23/19 1837  Temp    Pulse 75 04/23/19 1840  Resp 22 04/23/19 1840  SpO2 98 % 04/23/19 1840  Vitals shown include unvalidated device data.  Last Pain:  Vitals:   04/23/19 1231  TempSrc:   PainSc: 0-No pain      Patients Stated Pain Goal: 4 ([id number])  Complications: No apparent anesthesia complications

## 2019-04-23 NOTE — Progress Notes (Signed)
Night nurse in another pt. Room, instructed to come up and give bedside report.

## 2019-04-23 NOTE — H&P (Signed)
Subjective: Patient is a 73 y.o. female admitted for plif. Onset of symptoms was several months ago, gradually worsening since that time.  The pain is rated severe, and is located at the across the lower back and radiates to [place]. The pain is described as aching and occurs all day. The symptoms have been progressive. Symptoms are exacerbated by nothing in particular. MRI or CT showed spondylolisthesis with stenosis l4-5   Past Medical History:  Diagnosis Date  . Adenomatous colon polyp   . Arthritis   . Cataract   . Complication of anesthesia   . Heart murmur    MVP  . Hyperlipidemia   . Hypertension   . PONV (postoperative nausea and vomiting)   . Thyroid disease     Past Surgical History:  Procedure Laterality Date  . BREAST EXCISIONAL BIOPSY Right 1983  . COLONOSCOPY W/ POLYPECTOMY  2003,2006,2013   [name]  . DILATION AND CURETTAGE OF UTERUS     X2  . NEPHRECTOMY  2010   Left for 9.8cm benign complex cyst  . THYROIDECTOMY  2000   [name]  . TONSILLECTOMY    . TOTAL ABDOMINAL HYSTERECTOMY W/ BILATERAL SALPINGOOPHORECTOMY  2003   [name] for dysfunctional menses    Prior to Admission medications   Medication Sig Start Date End Date Taking? Authorizing Provider  acetaminophen (TYLENOL ARTHRITIS PAIN) 650 MG CR tablet Take 650 mg by mouth every 8 (eight) hours as needed for pain.    Yes [provider]  ALPRAZolam (XANAX) 0.25 MG tablet TAKE 2 TABLETS BY MOUTH AT BEDTIME AS NEEDED FOR ANXIETY Patient taking differently: Take 0.25 mg by mouth at bedtime as needed for anxiety.  04/16/19  Yes Marrian Salvage, FNP  amLODipine (NORVASC) 5 MG tablet Take 1 tablet (5 mg total) by mouth daily. 04/22/19  Yes Marrian Salvage, FNP  aspirin 81 MG tablet Take 81 mg by mouth daily.     Yes [provider]  benazepril (LOTENSIN) 20 MG tablet TAKE 1 AND 1/2 TABLETS BY MOUTH DAILY Patient taking differently: Take 30 mg by mouth daily.  03/06/19  Yes Plotnikov,  Evie Lacks, MD  Calcium Carbonate-Vitamin D (CALTRATE 600+D PO) Take 1 tablet by mouth 3 (three) times a week.    Yes [provider]  gabapentin (NEURONTIN) 100 MG capsule TAKE 2 CAPSULES (200 MG TOTAL) BY MOUTH AT BEDTIME. 07/17/18  Yes Hulan Saas M, DO  levothyroxine (SYNTHROID) 75 MCG tablet TAKE 1 TABLET BY MOUTH EVERY DAY Patient taking differently: Take 75 mcg by mouth daily before breakfast.  04/08/19  Yes Marrian Salvage, FNP  metoprolol succinate (TOPROL-XL) 50 MG 24 hr tablet Take 1 tablet (50 mg total) by mouth daily. Take with or immediately following a meal. 02/21/19  Yes Marrian Salvage, FNP  Misc Natural Products (TART CHERRY ADVANCED PO) Take 1 capsule by mouth daily.    Yes [provider]  Multiple Vitamins-Minerals (CENTRUM PO) Take 1 tablet by mouth daily.    Yes [provider]  potassium citrate (UROCIT-K 10) 10 MEQ (1080 MG) SR tablet Take 10 mEq by mouth daily.    Yes [provider]  rosuvastatin (CRESTOR) 20 MG tablet TAKE 1 TABLET BY MOUTH EVERY DAY Patient taking differently: Take 20 mg by mouth daily.  04/08/19  Yes Marrian Salvage, FNP  meloxicam (MOBIC) 15 MG tablet Take 1 tablet (15 mg total) by mouth daily. Patient not taking: Reported on 04/18/2019 03/27/19   Lyndal Pulley,  DO  venlafaxine XR (EFFEXOR-XR) 37.5 MG 24 hr capsule TAKE 1 CAPSULE (37.5 MG TOTAL) BY MOUTH DAILY WITH BREAKFAST. Patient not taking: Reported on 04/18/2019 03/27/19   Lyndal Pulley, DO  simvastatin (ZOCOR) 20 MG tablet Take 20 mg by mouth at bedtime.    11/10/11  [provider]   Allergies  Allergen Reactions  . Codeine Sulfate     "made me jumpy"  . Erythromycin Nausea Only    Social History   Tobacco Use  . Smoking status: Former Smoker    Packs/day: 0.50    Years: 4.00    Pack years: 2.00    Quit date: 08/29/1975    Years since quitting: 43.6  . Smokeless tobacco: Never Used  . Tobacco comment: smoked  1973-1977, up to 1/2 ppd  Substance Use Topics  . Alcohol use: Yes    Alcohol/week: 0.0 standard drinks    Comment: minimally  1 every 6 months    Family History  Problem Relation Age of Onset  . Transient ischemic attack Father        in 43s  . Coronary artery disease Father        stents @ 44  . Nephrolithiasis Father   . Hyperlipidemia Mother   . Transient ischemic attack Mother 26  . Thyroid disease Mother        hyperthyroid  . Urolithiasis Sister   . Diabetes Neg Hx      Review of Systems  Positive ROS: neg  All other systems have been reviewed and were otherwise negative with the exception of those mentioned in the HPI and as above.  Objective: Vital signs in last 24 hours: Temp:  [98.1 F (36.7 C)] 98.1 F (36.7 C) (08/26 1206) Pulse Rate:  [73] 73 (08/26 1206) Resp:  [18] 18 (08/26 1206) BP: (152)/(68) 152/68 (08/26 1206) SpO2:  [100 %] 100 % (08/26 1206) Weight:  [71.5 kg] 71.5 kg (08/26 1231)  General Appearance: Alert, cooperative, no distress, appears stated age Head: Normocephalic, without obvious abnormality, atraumatic Eyes: PERRL, conjunctiva/corneas clear, EOM's intact    Neck: Supple, symmetrical, trachea midline Back: Symmetric, no curvature, ROM normal, no CVA tenderness Lungs:  respirations unlabored Heart: Regular rate and rhythm Abdomen: Soft, non-tender Extremities: Extremities normal, atraumatic, no cyanosis or edema Pulses: 2+ and symmetric all extremities Skin: Skin color, texture, turgor normal, no rashes or lesions  NEUROLOGIC:   Mental status: Alert and oriented x4,  no aphasia, good attention span, fund of knowledge, and memory Motor Exam - grossly normal Sensory Exam - grossly normal Reflexes: 1+ Coordination - grossly normal Gait - grossly normal Balance - grossly normal Cranial Nerves: I: smell Not tested  II: visual acuity  OS: nl    OD: nl  II: visual fields Full to confrontation  II: pupils Equal, round, reactive to  light  III,VII: ptosis None  III,IV,VI: extraocular muscles  Full ROM  V: mastication Normal  V: facial light touch sensation  Normal  V,VII: corneal reflex  Present  VII: facial muscle function - upper  Normal  VII: facial muscle function - lower Normal  VIII: hearing Not tested  IX: soft palate elevation  Normal  IX,X: gag reflex Present  XI: trapezius strength  5/5  XI: sternocleidomastoid strength 5/5  XI: neck flexion strength  5/5  XII: tongue strength  Normal    Data Review Lab Results  Component Value Date   WBC 12.2 (H) 04/21/2019   HGB 16.9 (H) 04/21/2019  HCT 51.3 (H) 04/21/2019   MCV 97.5 04/21/2019   PLT 313 04/21/2019   Lab Results  Component Value Date   NA 140 04/21/2019   K 4.1 04/21/2019   CL 102 04/21/2019   CO2 25 04/21/2019   BUN 23 04/21/2019   CREATININE 0.93 04/21/2019   GLUCOSE 90 04/21/2019   Lab Results  Component Value Date   INR 1.0 04/21/2019    Assessment/Plan:  Estimated body mass index is 26.24 kg/m as calculated from the following:   Height as of this encounter: 5\' 5"  (1.651 m).   Weight as of this encounter: 71.5 kg. Patient admitted for PLIF L4-5. Patient has failed a reasonable attempt at conservative therapy.  I explained the condition and procedure to the patient and answered any questions.  Patient wishes to proceed with procedure as planned. Understands risks/ benefits and typical outcomes of procedure.   Eustace Moore 04/23/2019 2:44 PM

## 2019-04-23 NOTE — Progress Notes (Signed)
Pt. Dangled well denied nausea , pain tolerable, pt. Has a weak hip that needs replacement , but did well with stand and pivot to the wheelchair.

## 2019-04-23 NOTE — Op Note (Signed)
04/23/2019  6:30 PM  PATIENT:  [name]  73 y.o. female  PRE-OPERATIVE DIAGNOSIS: Degenerative spondylolisthesis L4-5 with spinal stenosis, back and leg pain  POST-OPERATIVE DIAGNOSIS:  same  PROCEDURE:   1. Decompressive lumbar laminectomy L4-5 requiring more work than would be required for a simple exposure of the disk for PLIF in order to adequately decompress the neural elements and address the spinal stenosis 2. Posterior lumbar interbody fusion L4-5 using porous titanium interbody cages packed with morcellized allograft and autograft soaked with a bone marrow aspirate obtained through a separate fascial incision over the right iliac crest 3. Posterior fixation L4-5 using Alphatec cortical pedicle screws.  4. Intertransverse arthrodesis L4-5 using morcellized autograft and allograft.  SURGEON:  Sherley Bounds, MD  ASSISTANTS: Lesly Rubenstein, FNP  ANESTHESIA:  General  EBL: 150 ml  Total I/O In: 1500 [I.V.:1500] Out: 455 [Urine:305; Blood:150]  BLOOD ADMINISTERED:none  DRAINS: none   INDICATION FOR PROCEDURE: This patient presented with severe back and bilateral leg pain worse on the left. Imaging revealed dynamic degenerative spondylolisthesis L4-5 with spinal stenosis. The patient tried a reasonable attempt at conservative medical measures without relief. I recommended decompression and instrumented fusion to address the stenosis as well as the segmental  instability.  Patient understood the risks, benefits, and alternatives and potential outcomes and wished to proceed.  PROCEDURE DETAILS:  The patient was brought to the operating room. After induction of generalized endotracheal anesthesia the patient was rolled into the prone position on chest rolls and all pressure points were padded. The patient's lumbar region was cleaned and then prepped with DuraPrep and draped in the usual sterile fashion. Anesthesia was injected and then a dorsal midline incision was made and carried  down to the lumbosacral fascia. The fascia was opened and the paraspinous musculature was taken down in a subperiosteal fashion to expose L4-5. A self-retaining retractor was placed. Intraoperative fluoroscopy confirmed my level, and I started with placement of the L4 cortical pedicle screws. The pedicle screw entry zones were identified utilizing surface landmarks and  AP and lateral fluoroscopy. I scored the cortex with the high-speed drill and then used the hand drill to drill an upward and outward direction into the pedicle. I then tapped line to line. I then placed a 5.5 x 40 mm cortical pedicle screw into the pedicles of L4 bilaterally.  I then dissected in a suprafascial plane to expose the iliac crest.  Open the fascia used a Jamshidi needle to extract 60 cc of bone marrow aspirate from the iliac crest.  This was then spun down by [facility] device and 2 to 4 cc of  BMAC was soaked on morselized allograft for later arthrodesis.  I dried the hole with Surgifoam and closed the fascia.  I then turned my attention to the decompression and complete lumbar laminectomies, hemi- facetectomies, and foraminotomies were performed at L4-5. The patient had significant spinal stenosis and this required more work than would be required for a simple exposure of the disc for posterior lumbar interbody fusion which would only require a limited laminotomy. Much more generous decompression and generous foraminotomy was undertaken in order to adequately decompress the neural elements and address the patient's leg pain. The yellow ligament was removed to expose the underlying dura and nerve roots, and generous foraminotomies were performed to adequately decompress the neural elements. Both the exiting and traversing nerve roots were decompressed on both sides until a coronary dilator passed easily along the nerve roots. Once the decompression  was complete, I turned my attention to the posterior lower lumbar interbody fusion. The  epidural venous vasculature was coagulated and cut sharply. Disc space was incised and the initial discectomy was performed with pituitary rongeurs. The disc space was distracted with sequential distractors to a height of 10 mm. We then used a series of scrapers and shavers to prepare the endplates for fusion. The midline was prepared with Epstein curettes. Once the complete discectomy was finished, we packed an appropriate sized interbody cage with local autograft and morcellized allograft, gently retracted the nerve root, and tapped the cage into position at L4-5.  The midline between the cages was packed with morselized autograft and allograft. We then turned our attention to the placement of the lower pedicle screws. The pedicle screw entry zones were identified utilizing surface landmarks and fluoroscopy. I drilled into each pedicle utilizing the hand drill, and tapped each pedicle with the appropriate tap. We palpated with a ball probe to assure no break in the cortex. We then placed 5.5 x 40 mm pedicle screws into the pedicles bilaterally at L5. We then decorticated the transverse processes and laid a mixture of morcellized autograft and allograft out over these to perform intertransverse arthrodesis at L4-5. We then placed lordotic rods into the multiaxial screw heads of the pedicle screws and locked these in position with the locking caps and anti-torque device. We then checked our construct with AP and lateral fluoroscopy. Irrigated with copious amounts of bacitracin-containing saline solution. Inspected the nerve roots once again to assure adequate decompression, lined to the dura with Gelfoam, placed powdered vancomycin into the wound, and closed the muscle and the fascia with 0 Vicryl. Closed the subcutaneous tissues with 2-0 Vicryl and subcuticular tissues with 3-0 Vicryl. The skin was closed with benzoin and Steri-Strips. Dressing was then applied, the patient was awakened from general anesthesia and  transported to the recovery room in stable condition. At the end of the procedure all sponge, needle and instrument counts were correct.   PLAN OF CARE: admit to inpatient  PATIENT DISPOSITION:  PACU - hemodynamically stable.   Delay start of Pharmacological VTE agent (>24hrs) due to surgical blood loss or risk of bleeding:  yes

## 2019-04-23 NOTE — Progress Notes (Signed)
Pt. Stated nausea and pain are both improved . Sat hob up to 60 degrees will try to call report .

## 2019-04-24 ENCOUNTER — Other Ambulatory Visit (HOSPITAL_COMMUNITY): Payer: Medicare Other

## 2019-04-24 MED ORDER — OXYCODONE HCL 5 MG PO TABS: 5.0000 mg | ORAL_TABLET | Freq: Four times a day (QID) | ORAL | 0 refills | Status: DC | PRN

## 2019-04-24 NOTE — Evaluation (Signed)
Occupational Therapy Evaluation Patient Details Name: [name] MRN: [id number] DOB: June 28, 1946 Today's Date: 04/24/2019    History of Present Illness Pt is a 73 yo female s/p PLIF L4-5. Pt PMHx: HTN, PONV.   Clinical Impression   Pt PTA: living with spouse and independent. Pt currently mobilizing well with no AD and modified independence. Pt currently performing ADL with supervisionA to modified independence. Pt able to state precautions and had dressed herself prior to OT arrival. Back handout provided and reviewed ADL in detail. Pt independent with donning brace at this time and required cue to wear in sitting. Pt educated on: clothing between brace, never sleep in brace, set an alarm at night for medication, avoid sitting for long periods of time, correct bed positioning for sleeping, correct sequence for bed mobility, avoiding lifting more than 5 pounds and never wash directly over incision. All education is complete and patient indicates understanding. Pt does not require continued OT. OT signing off.     Follow Up Recommendations  No OT follow up    Equipment Recommendations  None recommended by OT    Recommendations for Other Services       Precautions / Restrictions Precautions Precautions: Back Precaution Booklet Issued: Yes (comment) Precaution Comments: verbally discussed booklet Required Braces or Orthoses: Spinal Brace Spinal Brace: Lumbar corset Restrictions Weight Bearing Restrictions: No      Mobility Bed Mobility Overal bed mobility: Needs Assistance Bed Mobility: Supine to Sit     Supine to sit: Supervision     General bed mobility comments: verbally discussing log roll technique  Transfers Overall transfer level: Needs assistance Equipment used: None Transfers: Sit to/from Stand Sit to Stand: Supervision              Balance Overall balance assessment: Modified Independent                                         ADL  either performed or assessed with clinical judgement   ADL Overall ADL's : At baseline                                       General ADL Comments: Pt using figure 4 technique with ease. Education provided for LB AE, but pt feels as though she will nto need it.      Vision Baseline Vision/History: No visual deficits Vision Assessment?: No apparent visual deficits     Perception     Praxis      Pertinent Vitals/Pain Pain Assessment: No/denies pain     Hand Dominance     Extremity/Trunk Assessment Upper Extremity Assessment Upper Extremity Assessment: Overall WFL for tasks assessed   Lower Extremity Assessment Lower Extremity Assessment: Overall WFL for tasks assessed   Cervical / Trunk Assessment Cervical / Trunk Assessment: Other exceptions Cervical / Trunk Exceptions: s/p lumbar surgery   Communication Communication Communication: No difficulties   Cognition Arousal/Alertness: Awake/alert Behavior During Therapy: WFL for tasks assessed/performed Overall Cognitive Status: Within Functional Limits for tasks assessed                                     General Comments       Exercises     Shoulder  Instructions      Home Living Family/patient expects to be discharged to:: Private residence Living Arrangements: Spouse/significant other Available Help at Discharge: Family;Available PRN/intermittently Type of Home: House Home Access: Level entry     Home Layout: Multi-level;Bed/bath upstairs Alternate Level Stairs-Number of Steps: 8 Alternate Level Stairs-Rails: Left Bathroom Shower/Tub: Tub/shower unit;Walk-in shower   Bathroom Toilet: Handicapped height     Home Equipment: Environmental consultant - 4 wheels;Walker - 2 wheels          Prior Functioning/Environment Level of Independence: Independent with assistive device(s)                 OT Problem List: Pain      OT Treatment/Interventions:      OT Goals(Current goals can  be found in the care plan section)    OT Frequency:     Barriers to D/C:            Co-evaluation              AM-PAC OT "6 Clicks" Daily Activity     Outcome Measure Help from another person eating meals?: None Help from another person taking care of personal grooming?: None Help from another person toileting, which includes using toliet, bedpan, or urinal?: None Help from another person bathing (including washing, rinsing, drying)?: None Help from another person to put on and taking off regular upper body clothing?: None Help from another person to put on and taking off regular lower body clothing?: None 6 Click Score: 24   End of Session Equipment Utilized During Treatment: Back brace Nurse Communication: Mobility status  Activity Tolerance: Patient tolerated treatment well Patient left: in bed;with call bell/phone within reach  OT Visit Diagnosis: Unsteadiness on feet (R26.81);Muscle weakness (generalized) (M62.81)                Time: [id number] OT Time Calculation (min): 16 min Charges:  OT General Charges $OT Visit: 1 Visit OT Evaluation $OT Eval Moderate Complexity: 1 Mod  Darryl Nestle) Marsa Aris OTR/L Acute Rehabilitation Services Pager: [phone] Office: [place] 04/24/2019, 9:09 AM

## 2019-04-24 NOTE — Discharge Summary (Signed)
Physician Discharge Summary  Patient ID: [name] MRN: [id number] DOB/AGE: 73-Jan-1947 73 y.o.  Admit date: 04/23/2019 Discharge date: 04/24/2019  Admission Diagnoses: spondylolisthesis    Discharge Diagnoses: same   Discharged Condition: good  Hospital Course: The patient was admitted on 04/23/2019 and taken to the operating room where the patient underwent PLIF L4-5. The patient tolerated the procedure well and was taken to the recovery room and then to the floor in stable condition. The hospital course was routine. There were no complications. The wound remained clean dry and intact. Pt had appropriate back soreness. No complaints of leg pain or new N/T/W. The patient remained afebrile with stable vital signs, and tolerated a regular diet. The patient continued to increase activities, and pain was well controlled with oral pain medications.   Consults: None  Significant Diagnostic Studies:  Results for orders placed or performed during the hospital encounter of 04/21/19  ABO/Rh  Result Value Ref Range   ABO/RH(D)      O NEG Performed at [facility] [address], [place], [place] [address]     Chest 2 View  Result Date: 04/21/2019 CLINICAL DATA:  Spondylolisthesis. Pre-op respiratory exam for lumbar spine surgery. EXAM: CHEST - 2 VIEW COMPARISON:  11/20/2008 FINDINGS: The heart size and mediastinal contours are within normal limits. Aortic atherosclerosis. Aortic atherosclerosis. Both lungs are clear. Degenerative disc disease noted in the lower thoracic and upper lumbar spine. IMPRESSION: No active cardiopulmonary disease. Electronically Signed   By: Marlaine Hind M.D.   On: 04/21/2019 21:15   Dg Lumbar Spine 2-3 Views  Result Date: 04/23/2019 CLINICAL DATA:  73 year old female undergoing L4-L5 posterior lumbar interbody fusion EXAM: LUMBAR SPINE - 2-3 VIEW COMPARISON:  Preoperative MRI 07/11/2018 FINDINGS: Frontal and cross-table lateral radiographs are submitted for  review. The images demonstrate bilateral pedicle screw and rod posterior lumbar interbody fusion at L4-L5. An interbody graft is present. No evidence of immediate hardware complication. IMPRESSION: Completion images from L4-L5 posterior lumbar interbody fusion with interbody graft. Electronically Signed   By: Jacqulynn Cadet M.D.   On: 04/23/2019 18:48   Dg C-arm 1-60 Min  Result Date: 04/23/2019 CLINICAL DATA:  73 year old female undergoing L4-L5 posterior lumbar interbody fusion EXAM: LUMBAR SPINE - 2-3 VIEW COMPARISON:  Preoperative MRI 07/11/2018 FINDINGS: Frontal and cross-table lateral radiographs are submitted for review. The images demonstrate bilateral pedicle screw and rod posterior lumbar interbody fusion at L4-L5. An interbody graft is present. No evidence of immediate hardware complication. IMPRESSION: Completion images from L4-L5 posterior lumbar interbody fusion with interbody graft. Electronically Signed   By: Jacqulynn Cadet M.D.   On: 04/23/2019 18:48    Antibiotics:  Anti-infectives (From admission, onward)   Start     Dose/Rate Route Frequency Ordered Stop   04/24/19 0600  ceFAZolin (ANCEF) IVPB 2g/100 mL premix     2 g 200 mL/hr over 30 Minutes Intravenous On call to O.R. 04/23/19 1206 04/24/19 0600   04/23/19 2300  ceFAZolin (ANCEF) IVPB 2g/100 mL premix     2 g 200 mL/hr over 30 Minutes Intravenous Every 8 hours 04/23/19 1914 04/24/19 0559   04/23/19 1621  vancomycin (VANCOCIN) powder  Status:  Discontinued       As needed 04/23/19 1621 04/23/19 1829   04/23/19 1618  bacitracin 50,000 Units in sodium chloride 0.9 % 500 mL irrigation  Status:  Discontinued       As needed 04/23/19 1618 04/23/19 1829   04/23/19 1213  ceFAZolin (ANCEF)  2-4 GM/100ML-% IVPB    Note to Pharmacy: Jasmine Pang   : cabinet override      04/23/19 1213 04/23/19 1538      Discharge Exam: Blood pressure (!) 135/57, pulse 79, temperature 97.9 F (36.6 C), temperature source Oral, resp.  rate 16, height 5\' 5"  (1.651 m), weight 71.5 kg, SpO2 97 %. Neurologic: Grossly normal dressing dry  Discharge Medications:   Allergies as of 04/24/2019      Reactions   Codeine Sulfate    "made me jumpy"   Erythromycin Nausea Only      Medication List    TAKE these medications   ALPRAZolam 0.25 MG tablet Commonly known as: XANAX TAKE 2 TABLETS BY MOUTH AT BEDTIME AS NEEDED FOR ANXIETY What changed: See the new instructions.   amLODipine 5 MG tablet Commonly known as: NORVASC Take 1 tablet (5 mg total) by mouth daily.   aspirin 81 MG tablet Take 81 mg by mouth daily.   benazepril 20 MG tablet Commonly known as: LOTENSIN TAKE 1 AND 1/2 TABLETS BY MOUTH DAILY What changed:   how much to take  how to take this  when to take this  additional instructions   CALTRATE 600+D PO Take 1 tablet by mouth 3 (three) times a week.   CENTRUM PO Take 1 tablet by mouth daily.   gabapentin 100 MG capsule Commonly known as: NEURONTIN TAKE 2 CAPSULES (200 MG TOTAL) BY MOUTH AT BEDTIME.   levothyroxine 75 MCG tablet Commonly known as: SYNTHROID TAKE 1 TABLET BY MOUTH EVERY DAY What changed:   how much to take  how to take this  when to take this  additional instructions   meloxicam 15 MG tablet Commonly known as: MOBIC Take 1 tablet (15 mg total) by mouth daily.   metoprolol succinate 50 MG 24 hr tablet Commonly known as: TOPROL-XL Take 1 tablet (50 mg total) by mouth daily. Take with or immediately following a meal.   oxyCODONE 5 MG immediate release tablet Commonly known as: Oxy IR/ROXICODONE Take 1 tablet (5 mg total) by mouth every 6 (six) hours as needed for moderate pain or severe pain ((score 4 to 6)).   rosuvastatin 20 MG tablet Commonly known as: CRESTOR TAKE 1 TABLET BY MOUTH EVERY DAY   TART CHERRY ADVANCED PO Take 1 capsule by mouth daily.   Tylenol Arthritis Pain 650 MG CR tablet Generic drug: acetaminophen Take 650 mg by mouth every 8 (eight)  hours as needed for pain.   Urocit-K 10 10 MEQ (1080 MG) SR tablet Generic drug: potassium citrate Take 10 mEq by mouth daily.   venlafaxine XR 37.5 MG 24 hr capsule Commonly known as: EFFEXOR-XR TAKE 1 CAPSULE (37.5 MG TOTAL) BY MOUTH DAILY WITH BREAKFAST.            Durable Medical Equipment  (From admission, onward)         Start     Ordered   04/23/19 2036  DME Walker rolling  Once    Question:  Patient needs a walker to treat with the following condition  Answer:  S/P lumbar fusion   04/23/19 2035   04/23/19 2036  DME 3 n 1  Once     04/23/19 2035          Disposition: home   Final Dx: PLIF L4-5  Discharge Instructions     Remove dressing in 72 hours   Complete by: As directed    Call MD for:  difficulty  breathing, headache or visual disturbances   Complete by: As directed    Call MD for:  persistant nausea and vomiting   Complete by: As directed    Call MD for:  redness, tenderness, or signs of infection (pain, swelling, redness, odor or green/yellow discharge around incision site)   Complete by: As directed    Call MD for:  severe uncontrolled pain   Complete by: As directed    Call MD for:  temperature >100.4   Complete by: As directed    Diet - low sodium heart healthy   Complete by: As directed    Increase activity slowly   Complete by: As directed          Signed: Eustace Moore 04/24/2019, 8:07 AM

## 2019-04-24 NOTE — Evaluation (Signed)
Physical Therapy Evaluation Patient Details Name: [name] MRN: [id number] DOB: 1946/03/05 Today's Date: 04/24/2019   History of Present Illness  Pt is a 73 yo female s/p PLIF L4-5. Pt PMJHX: HTN, PONV.  Clinical Impression  Patient is s/p above surgery resulting in the deficits listed below (see PT Problem List). Patient is mobilizing well and feels ready and safe for DC.  I have encouraged the patient to gradually increase activity daily to tolerance.  I have answered all patient's question regarding PT and mobility.       Follow Up Recommendations No PT follow up;Supervision for mobility/OOB    Equipment Recommendations  None recommended by PT    Recommendations for Other Services       Precautions / Restrictions Precautions Precautions: Back Precaution Booklet Issued: Yes (comment) Precaution Comments: (Educated on back precautions) Required Braces or Orthoses: Spinal Brace Spinal Brace: Lumbar corset Restrictions Weight Bearing Restrictions: No      Mobility  Bed Mobility Overal bed mobility: Modified Independent Bed Mobility: Supine to Sit     Supine to sit: Modified independent (Device/Increase time)     General bed mobility comments: (pt demonstrated log rolling and we verbally discussed)  Transfers Overall transfer level: Needs assistance Equipment used: None Transfers: Sit to/from Stand Sit to Stand: Supervision         General transfer comment: uses UE's to assist with sit to stand  Ambulation/Gait Ambulation/Gait assistance: Supervision Gait Distance (Feet): 200 Feet Assistive device: None Gait Pattern/deviations: Step-through pattern     General Gait Details: no balance losses, no increased pain with gait  Stairs Stairs: Yes Stairs assistance: Min guard Stair Management: One rail Right;Step to pattern;Forwards Number of Stairs: 10 General stair comments: no balance losses or knee buckling  Wheelchair Mobility    Modified Rankin  (Stroke Patients Only)       Balance Overall balance assessment: No apparent balance deficits (not formally assessed)                                           Pertinent Vitals/Pain Pain Assessment: 0-10 Pain Score: 0-No pain Pain Location: low back Pain Descriptors / Indicators: Sore Pain Intervention(s): Limited activity within patient's tolerance;Monitored during session    Home Living Family/patient expects to be discharged to:: Private residence Living Arrangements: Spouse/significant other Available Help at Discharge: Family;Available PRN/intermittently Type of Home: House Home Access: Level entry     Home Layout: Multi-level;Bed/bath upstairs Home Equipment: Walker - 4 wheels;Walker - 2 wheels      Prior Function Level of Independence: Independent               Hand Dominance        Extremity/Trunk Assessment   Upper Extremity Assessment Upper Extremity Assessment: Defer to OT evaluation    Lower Extremity Assessment Lower Extremity Assessment: Overall WFL for tasks assessed(Reports left LE issues secondary to hip pain)    Cervical / Trunk Assessment Cervical / Trunk Assessment: Other exceptions Cervical / Trunk Exceptions: s/p lumbar surgery  Communication   Communication: No difficulties  Cognition Arousal/Alertness: Awake/alert Behavior During Therapy: WFL for tasks assessed/performed Overall Cognitive Status: Within Functional Limits for tasks assessed  General Comments General comments (skin integrity, edema, etc.): able to don/doff back brace    Exercises     Assessment/Plan    PT Assessment Patent does not need any further PT services  PT Problem List         PT Treatment Interventions      PT Goals (Current goals can be found in the Care Plan section)  Acute Rehab PT Goals Patient Stated Goal: Pt states she is ready for DC    Frequency     Barriers  to discharge        Co-evaluation               AM-PAC PT "6 Clicks" Mobility  Outcome Measure Help needed turning from your back to your side while in a flat bed without using bedrails?: None Help needed moving from lying on your back to sitting on the side of a flat bed without using bedrails?: None Help needed moving to and from a bed to a chair (including a wheelchair)?: None Help needed standing up from a chair using your arms (e.g., wheelchair or bedside chair)?: None Help needed to walk in hospital room?: A Little Help needed climbing 3-5 steps with a railing? : A Little 6 Click Score: 22    End of Session Equipment Utilized During Treatment: Gait belt;Back brace Activity Tolerance: Patient tolerated treatment well Patient left: in bed;with call bell/phone within reach Nurse Communication: Mobility status PT Visit Diagnosis: Unsteadiness on feet (R26.81)    Time: [id number] PT Time Calculation (min) (ACUTE ONLY): 21 min   Charges:   PT Evaluation $PT Eval Moderate Complexity: [place], PT   Acute Rehabilitation Services  Pager [phone] Office [phone] 04/24/2019   Melvern Banker 04/24/2019, 10:37 AM

## 2019-04-24 NOTE — Progress Notes (Signed)
Pt doing well. Pt given D/C instructions with verbal understanding. Pt's incision is clean and dry with no sign of infection. Pt's IV was removed prior to D/C. Pt D/C'd home via wheelchair @ 1010 per MD order. Pt is stable @ D/C and has no other needs at this time. Holli Humbles, RN

## 2019-04-24 NOTE — Discharge Instructions (Signed)
Wound Care °Keep incision covered and dry for one week.  If you shower prior to then, cover incision with plastic wrap.  °You may remove outer bandage after one week and shower.  °Do not put any creams, lotions, or ointments on incision. °Leave steri-strips on neck.  They will fall off by themselves. °Activity °Walk each and every day, increasing distance each day. °No lifting greater than 5 lbs.  Avoid bending, arching, or twisting. °No driving for 2 weeks; may ride as a passenger locally. °If provided with back brace, wear when out of bed.  It is not necessary to wear in bed. °Diet °Resume your normal diet.  °Return to Work °Will be discussed at you follow up appointment. °Call Your Doctor If Any of These Occur °Redness, drainage, or swelling at the wound.  °Temperature greater than 101 degrees. °Severe pain not relieved by pain medication. °Incision starts to come apart. °Follow Up Appt °Call today for appointment in 1-2 weeks ([phone]) or for problems.  If you have any hardware placed in your spine, you will need an x-ray before your appointment. ° ° °Spinal Fusion, Adult, Care After °This sheet gives you information about how to care for yourself after your procedure. Your doctor may also give you more specific instructions. If you have problems or questions, contact your doctor. °Follow these instructions at home: °Medicines °· Take over-the-counter and prescription medicines only as told by your doctor. These include any medicines for pain or blood-thinning medicines (anticoagulants). °· If you were prescribed an antibiotic medicine, take it as told by your doctor. Do not stop taking the antibiotic even if you start to feel better. °· Do not drive for 24 hours if you were given a medicine to help you relax (sedative) during your procedure. °· Do not drive or use heavy machinery while taking prescription pain medicine. °If you have a brace: °· Wear the brace as told by your doctor. Take it off only as told by  your doctor. °· Keep the brace clean. °Managing pain, stiffness, and swelling °· If directed, put ice on the surgery area: °? If you have a removable brace, take it off as told by your doctor. °? Put ice in a plastic bag. °? Place a towel between your skin and the bag. °? Leave the ice on for 20 minutes, 2-3 times a day. °Surgery cut care ° °· Follow instructions from your doctor about how to take care of your cut from surgery (incision). Make sure you: °? Wash your hands with soap and water before you change your bandage (dressing). If you cannot use soap and water, use hand sanitizer. °? Change your bandage as told by your doctor. °? Leave stitches (sutures), skin glue, or skin tape (adhesive) strips in place. They may need to stay in place for 2 weeks or longer. If tape strips get loose and curl up, you may trim the loose edges. Do not remove tape strips completely unless your doctor says it is okay. °· Keep your cut from surgery clean and dry. °? Do not take baths, swim, or use a hot tub until your doctor says it is okay. °? Ask your doctor if you can take showers. You may only be allowed to take sponge baths. °· Every day, check your cut from surgery and the area around it for: °? More redness, swelling, or pain. °? Fluid or blood. °? Warmth. °? Pus or a bad smell. °· If you have a drain tube, follow instructions   from your doctor about caring for it. Do not take out the drain tube or any bandages unless your doctor says it is okay. °Physical activity °· Rest and protect your back as much as possible. °· Follow instructions from your doctor about how to move. Use good posture to help your spine heal. °· Do not lift anything that is heavier than 8 lb (3.6 kg), or the limit that you are told, until your doctor says that it is safe. °· Do not twist or bend at the waist until your doctor says it is okay. °· It is best if you: °? Do not make pushing and pulling motions. °? Do not sit or lie down in the same position  for a long time. °? Do not raise your hands or arms above your head. °· Return to your normal activities as told by your doctor. Ask your doctor what activities are safe for you. Rest and protect your back as much as you can. °· Do not start to exercise until your doctor says it is okay. Ask your doctor what kinds of exercise you can do to make your back stronger. °General instructions °· To prevent blood clots and lessen swelling in your legs: °? Wear compression stockings as told. °? Walk one or more times every few hours as told by your doctor. °· Do not use any products that contain nicotine or tobacco, such as cigarettes and e-cigarettes. These can delay bone healing. If you need help quitting, ask your doctor. °· To prevent or treat constipation while you are taking prescription pain medicine, your doctor may suggest that you: °? Drink enough fluid to keep your pee (urine) pale yellow. °? Take over-the-counter or prescription medicines. °? Eat foods that are high in fiber. These include fresh fruits and vegetables, whole grains, and beans. °? Limit foods that are high in fat and processed sugars, such as fried and sweet foods. °· Keep all follow-up visits as told by your doctor. This is important. °Contact a doctor if: °· Your pain gets worse. °· Your medicine does not help your pain. °· Your legs or feet get painful or swollen. °· Your cut from surgery is more red, swollen, or painful. °· Your cut from surgery feels warm to the touch. °· You have: °? Fluid or blood coming from your cut from surgery. °? Pus or a bad smell coming from your cut from surgery. °? A fever. °? Weakness or loss of feeling (numbness) in your legs that is new or getting worse. °? Trouble controlling when you pee (urinate) or poop (have a bowel movement). °· You feel sick to your stomach (nauseous). °· You throw up (vomit). °Get help right away if: °· Your pain is very bad. °· You have chest pain. °· You have trouble breathing. °· You  start to have a cough. °These symptoms may be an emergency. Do not wait to see if the symptoms will go away. Get medical help right away. Call your local emergency services (911 in the [place] Do not drive yourself to the hospital. °Summary °· After the procedure, it is common to have pain in your back and pain by your surgery cut(s). °· Icing and pain medicines may help to control the pain. Follow directions from your doctor. °· Rest and protect your back as much as possible. Do not twist or bend at the waist. °· Get up and walk one or more times every few hours as told by your doctor. °This information   is not intended to replace advice given to you by your health care provider. Make sure you discuss any questions you have with your health care provider. °Document Released: 12/08/2010 Document Revised: 12/05/2018 Document Reviewed: 11/28/2016 °Elsevier Patient Education © 2020 [employer] ° °

## 2019-04-24 NOTE — Progress Notes (Signed)
Orthopedic Tech Progress Note Patient Details:  [name] 1946/03/14 [id number] RN said patient has brace Patient ID: [name], female   DOB: [birth year], 73 y.o.   MRN: [id number]   [name] 04/24/2019, 8:00 AM

## 2019-04-25 ENCOUNTER — Telehealth: Payer: Self-pay | Admitting: *Deleted

## 2019-04-25 NOTE — Telephone Encounter (Signed)
Patient discharged from hospital on 04/24/19 after PLIF L4-5. Patient tolerated the procedure well and discharge instruction state for her to f/u with Dr. Ronnald Ramp neurosurgery.

## 2019-05-03 ENCOUNTER — Other Ambulatory Visit: Payer: Self-pay | Admitting: Family

## 2019-05-03 DIAGNOSIS — E890 Postprocedural hypothyroidism: Secondary | ICD-10-CM

## 2019-05-04 ENCOUNTER — Other Ambulatory Visit: Payer: Self-pay | Admitting: Family

## 2019-05-04 DIAGNOSIS — E782 Mixed hyperlipidemia: Secondary | ICD-10-CM

## 2019-05-13 ENCOUNTER — Other Ambulatory Visit: Payer: Self-pay | Admitting: Family

## 2019-05-13 DIAGNOSIS — E782 Mixed hyperlipidemia: Secondary | ICD-10-CM

## 2019-05-13 MED ORDER — ROSUVASTATIN CALCIUM 20 MG PO TABS: 20.0000 mg | ORAL_TABLET | Freq: Every day | ORAL | 0 refills | Status: DC

## 2019-05-30 ENCOUNTER — Other Ambulatory Visit: Payer: Self-pay | Admitting: Internal Medicine

## 2019-05-30 ENCOUNTER — Other Ambulatory Visit: Payer: Self-pay | Admitting: Family

## 2019-05-30 DIAGNOSIS — E890 Postprocedural hypothyroidism: Secondary | ICD-10-CM

## 2019-06-02 DIAGNOSIS — M1612 Unilateral primary osteoarthritis, left hip: Secondary | ICD-10-CM | POA: Diagnosis present

## 2019-06-02 NOTE — H&P (Signed)
HIP ARTHROPLASTY ADMISSION H&P  Patient ID: [name] MRN: [id number] DOB/AGE: 73-Nov-1947 73 y.o.  Chief Complaint: left hip pain.  Planned Procedure Date: 06/24/2019 Medical Clearance by Marvis Repress, NP Additional clearance by neurosurgery: Dr. Ronnald Ramp  HPI: [name] is a 73 y.o. female with history of recent lumbar surgery, HTN, HLD, PONV, left nephrectomy who presents for evaluation of OA LEFT HIP. The patient has a history of pain and functional disability in the left hip due to arthritis and has failed non-surgical conservative treatments for greater than 12 weeks to include NSAID's and/or analgesics and activity modification.  Onset of symptoms was gradual, starting 2 years ago with gradually worsening course since that time.  Patient currently rates pain at 8 out of 10 with activity. Patient has night pain, worsening of pain with activity and weight bearing and pain that interferes with activities of daily living.  Patient has evidence of subchondral cysts, subchondral sclerosis, periarticular osteophytes and joint space narrowing by imaging studies.  There is no active infection.  Past Medical History:  Diagnosis Date  . Adenomatous colon polyp   . Arthritis   . Cataract   . Complication of anesthesia   . Heart murmur    MVP  . Hyperlipidemia   . Hypertension   . PONV (postoperative nausea and vomiting)   . Thyroid disease    Past Surgical History:  Procedure Laterality Date  . BREAST EXCISIONAL BIOPSY Right 1983  . COLONOSCOPY W/ POLYPECTOMY  2003,2006,2013   [name]  . DILATION AND CURETTAGE OF UTERUS     X2  . NEPHRECTOMY  2010   Left for 9.8cm benign complex cyst  . THYROIDECTOMY  2000   [name]  . TONSILLECTOMY    . TOTAL ABDOMINAL HYSTERECTOMY W/ BILATERAL SALPINGOOPHORECTOMY  2003   [name] for dysfunctional menses   Allergies  Allergen Reactions  . Codeine Sulfate     "made me jumpy"  . Erythromycin Nausea Only   Prior to Admission  medications   Medication Sig Start Date End Date Taking? Authorizing Provider  acetaminophen (TYLENOL ARTHRITIS PAIN) 650 MG CR tablet Take 650 mg by mouth every 8 (eight) hours as needed for pain.     [provider]  ALPRAZolam (XANAX) 0.25 MG tablet TAKE 2 TABLETS BY MOUTH AT BEDTIME AS NEEDED FOR ANXIETY Patient taking differently: Take 0.25 mg by mouth at bedtime as needed for anxiety.  04/16/19   Marrian Salvage, FNP  amLODipine (NORVASC) 5 MG tablet Take 1 tablet (5 mg total) by mouth daily. 04/22/19   Marrian Salvage, FNP  aspirin 81 MG tablet Take 81 mg by mouth daily.      [provider]  benazepril (LOTENSIN) 20 MG tablet TAKE 1 AND 1/2 TABLETS DAILY BY MOUTH 05/30/19   Marrian Salvage, FNP  Calcium Carbonate-Vitamin D (CALTRATE 600+D PO) Take 1 tablet by mouth 3 (three) times a week.     [provider]  gabapentin (NEURONTIN) 100 MG capsule TAKE 2 CAPSULES (200 MG TOTAL) BY MOUTH AT BEDTIME. 07/17/18   Lyndal Pulley, DO  levothyroxine (SYNTHROID) 75 MCG tablet TAKE 1 TABLET BY MOUTH EVERY DAY 05/30/19   Marrian Salvage, FNP  meloxicam (MOBIC) 15 MG tablet Take 1 tablet (15 mg total) by mouth daily. Patient not taking: Reported on 04/18/2019 03/27/19   Lyndal Pulley, DO  metoprolol succinate (TOPROL-XL) 50 MG 24 hr tablet Take 1 tablet (50 mg total) by mouth daily. Take with  or immediately following a meal. 02/21/19   Marrian Salvage, FNP  Misc Natural Products (TART CHERRY ADVANCED PO) Take 1 capsule by mouth daily.     [provider]  Multiple Vitamins-Minerals (CENTRUM PO) Take 1 tablet by mouth daily.     [provider]  oxyCODONE (OXY IR/ROXICODONE) 5 MG immediate release tablet Take 1 tablet (5 mg total) by mouth every 6 (six) hours as needed for moderate pain or severe pain ((score 4 to 6)). 04/24/19   Eustace Moore, MD  potassium citrate (UROCIT-K 10) 10 MEQ (1080 MG) SR tablet Take 10 mEq by mouth  daily.     [provider]  rosuvastatin (CRESTOR) 20 MG tablet Take 1 tablet (20 mg total) by mouth daily. 05/13/19   Marrian Salvage, FNP  venlafaxine XR (EFFEXOR-XR) 37.5 MG 24 hr capsule TAKE 1 CAPSULE (37.5 MG TOTAL) BY MOUTH DAILY WITH BREAKFAST. Patient not taking: Reported on 04/18/2019 03/27/19   Lyndal Pulley, DO  simvastatin (ZOCOR) 20 MG tablet Take 20 mg by mouth at bedtime.    11/10/11  [provider]   Social History   Socioeconomic History  . Marital status: Married    Spouse name: Not on file  . Number of children: 2  . Years of education: Not on file  . Highest education level: Not on file  Occupational History  . Occupation: [employer]  . Financial resource strain: Not hard at all  . Food insecurity    Worry: Never true    Inability: Never true  . Transportation needs    Medical: No    Non-medical: No  Tobacco Use  . Smoking status: Former Smoker    Packs/day: 0.50    Years: 4.00    Pack years: 2.00    Quit date: 08/29/1975    Years since quitting: 43.7  . Smokeless tobacco: Never Used  . Tobacco comment: smoked 1973-1977, up to 1/2 ppd  Substance and Sexual Activity  . Alcohol use: Yes    Alcohol/week: 0.0 standard drinks    Comment: minimally  1 every 6 months  . Drug use: No  . Sexual activity: Yes    Comment: HYST  Lifestyle  . Physical activity    Days per week: 0 days    Minutes per session: 0 min  . Stress: Only a little  Relationships  . Social connections    Talks on phone: More than three times a week    Gets together: More than three times a week    Attends religious service: 1 to 4 times per year    Active member of club or organization: Yes    Attends meetings of clubs or organizations: 1 to 4 times per year    Relationship status: Married  Other Topics Concern  . Not on file  Social History Narrative   Fun/Hobbies: Grandkids   Family History  Problem Relation Age of Onset  . Transient  ischemic attack Father        in 76s  . Coronary artery disease Father        stents @ 84  . Nephrolithiasis Father   . Hyperlipidemia Mother   . Transient ischemic attack Mother 32  . Thyroid disease Mother        hyperthyroid  . Urolithiasis Sister   . Diabetes Neg Hx     ROS: Currently denies lightheadedness, dizziness, Fever, chills, CP, SOB.   No personal history of DVT,  PE, MI, or CVA. No loose teeth or dentures All other systems have been reviewed and were otherwise currently negative with the exception of those mentioned in the HPI and as above.  Objective: Vitals: Ht: 5'6" Wt: 158 Temp: 96.9 BP: 159/80 Pulse: 90 O2 97% on room air.   Physical Exam: General: Alert, NAD. Trendelenberg Gait  HEENT: EOMI, Good Neck Extension  Pulm: No increased work of breathing.  Clear B/L A/P w/o crackle or wheeze.  CV: RRR, No m/g/r appreciated  GI: soft, NT, ND Neuro: Neuro without gross focal deficit.  Sensation intact distally Skin: No lesions in the area of chief complaint MSK/Surgical Site: Left Hip pain with passive ROM.  Positive Stinchfield.  5/5 strength.  NVI.  Sensation intact distally.  Imaging Review Plain radiographs demonstrate severe degenerative joint disease of the left hip.   Preoperative templating of the joint replacement has been completed, documented, and submitted to the Operating Room personnel in order to optimize intra-operative equipment management.  Assessment: OA LEFT HIP Principal Problem:   Primary osteoarthritis of left hip Active Problems:   HYPERLIPIDEMIA   Essential hypertension   Anxiety and depression   S/P lumbar fusion   Plan: Plan for Procedure(s): TOTAL HIP ARTHROPLASTY ANTERIOR APPROACH  The patient history, physical exam, clinical judgement of the provider and imaging are consistent with end stage degenerative joint disease and total joint arthroplasty is deemed medically necessary. The treatment options including medical  management, injection therapy, and arthroplasty were discussed at length. The risks and benefits of Procedure(s): TOTAL HIP ARTHROPLASTY ANTERIOR APPROACH were presented and reviewed.  The risks of nonoperative treatment, versus surgical intervention including but not limited to continued pain, aseptic loosening, stiffness, dislocation/subluxation, infection, bleeding, nerve injury, blood clots, cardiopulmonary complications, morbidity, mortality, among others were discussed. The patient verbalizes understanding and wishes to proceed with the plan.  Patient is being admitted for surgery, pain control, PT, prophylactic antibiotics, VTE prophylaxis, progressive ambulation, ADL's and discharge planning.   Dental prophylaxis discussed and recommended for 2 years postoperatively.   The patient does meet the criteria for TXA which will be used perioperatively.    ASA 81 mg BID will be used postoperatively for DVT prophylaxis in addition to SCDs, and early ambulation.  Plan for Norco, 100 mg Gabapentin for pain.   Limit NSAIDs due to history of nephrectomy.  Baclofen for spasm.  The patient is planning to be discharged home with HHPT ([facility]) in care of her husband.   Anticipated LOS less than 2 midnights.  Inpatient insurance approval due to - Age 66 and older with one or more of the following:  - Expected need for hospital services (PT, OT, Nursing) required for safe discharge    Prudencio Burly III, PA-C 06/02/2019 8:40 AM

## 2019-06-05 ENCOUNTER — Ambulatory Visit: Payer: Medicare Other

## 2019-06-11 ENCOUNTER — Other Ambulatory Visit: Payer: Self-pay | Admitting: Family

## 2019-06-11 DIAGNOSIS — F32A Depression, unspecified: Secondary | ICD-10-CM

## 2019-06-11 DIAGNOSIS — F329 Major depressive disorder, single episode, unspecified: Secondary | ICD-10-CM

## 2019-06-13 NOTE — Patient Instructions (Addendum)
DUE TO COVID-19 ONLY ONE VISITOR IS ALLOWED TO COME WITH YOU AND STAY IN THE WAITING ROOM ONLY DURING PRE OP AND PROCEDURE DAY OF SURGERY. THE 1 VISITOR MAY VISIT WITH YOU AFTER SURGERY IN YOUR PRIVATE ROOM DURING VISITING _,   The Covid -19 test is scheduled for Friday 06/20/2019 at 105 pm.THIS TEST MUST BE DONE BEFORE SURGERY, COME  [place], [place] , [address].  ([place]) ONCE YOUR COVID TEST IS COMPLETED, PLEASE BEGIN THE QUARANTINE INSTRUCTIONS AS OUTLINED IN YOUR HANDOUT.                [name]    Your procedure is scheduled on: Tuesday 06/24/2019   Report to [facility] Main  Entrance    Report to Short Stay at  [place] AM     Call this number if you have problems the morning of surgery [phone]    Remember: Do not eat food  :After Midnight.    NO SOLID FOOD AFTER MIDNIGHT THE NIGHT PRIOR TO SURGERY. NOTHING BY MOUTH EXCEPT CLEAR LIQUIDS UNTIL  0430 am .    PLEASE FINISH ENSURE DRINK PER SURGEON ORDER  WHICH NEEDS TO BE COMPLETED AT 0430 am .   CLEAR LIQUID DIET   Foods Allowed                                                                     Foods Excluded  Coffee and tea, regular and decaf                             liquids that you cannot  Plain Jell-O any favor except red or purple                                           see through such as: Fruit ices (not with fruit pulp)                                     milk, soups, orange juice  Iced Popsicles                                    All solid food Carbonated beverages, regular and diet                                    Cranberry, grape and apple juices Sports drinks like Gatorade Lightly seasoned clear broth or consume(fat free) Sugar, honey syrup  Sample Menu Breakfast                                Lunch  Supper Cranberry juice                    Beef broth                            Chicken broth Jell-O                                      Grape juice                           Apple juice Coffee or tea                        Jell-O                                      Popsicle                                                Coffee or tea                        Coffee or tea  _____________________________________________________________________     BRUSH YOUR TEETH MORNING OF SURGERY AND RINSE YOUR MOUTH OUT, NO CHEWING GUM CANDY OR MINTS.     Take these medicines the morning of surgery with A SIP OF WATER: Rosuvastatin (Crestor), Levothyroxine (Synthroid)                                 You may not have any metal on your body including hair pins and              piercings  Do not wear jewelry, make-up, lotions, powders or perfumes, deodorant             Do not wear nail polish on your fingernails.  Do not shave  48 hours prior to surgery.                 Do not bring valuables to the hospital. [name].  Contacts, dentures or bridgework may not be worn into surgery.  Leave suitcase in the car. After surgery it may be brought to your room.                Please read over the following fact sheets you were given: _____________________________________________________________________             [facility] - Preparing for Surgery Before surgery, you can play an important role.  Because skin is not sterile, your skin needs to be as free of germs as possible.  You can reduce the number of germs on your skin by washing with CHG (chlorahexidine gluconate) soap before surgery.  CHG is an antiseptic cleaner which kills germs and bonds with the skin to continue killing germs even after washing. Please DO NOT use if you have an allergy to CHG or antibacterial soaps.  If your skin becomes reddened/irritated stop using the CHG and inform your nurse when you arrive at Short Stay. Do not shave (including legs and underarms) for at least 48 hours prior to the first CHG  shower.  You may shave your face/neck. Please follow these instructions carefully:  1.  Shower with CHG Soap the night before surgery and the  morning of Surgery.  2.  If you choose to wash your hair, wash your hair first as usual with your  normal  shampoo.  3.  After you shampoo, rinse your hair and body thoroughly to remove the  shampoo.                           4.  Use CHG as you would any other liquid soap.  You can apply chg directly  to the skin and wash                       Gently with a scrungie or clean washcloth.  5.  Apply the CHG Soap to your body ONLY FROM THE NECK DOWN.   Do not use on face/ open                           Wound or open sores. Avoid contact with eyes, ears mouth and genitals (private parts).                       Wash face,  Genitals (private parts) with your normal soap.             6.  Wash thoroughly, paying special attention to the area where your surgery  will be performed.  7.  Thoroughly rinse your body with warm water from the neck down.  8.  DO NOT shower/wash with your normal soap after using and rinsing off  the CHG Soap.                9.  Pat yourself dry with a clean towel.            10.  Wear clean pajamas.            11.  Place clean sheets on your bed the night of your first shower and do not  sleep with pets. Day of Surgery : Do not apply any lotions/deodorants the morning of surgery.  Please wear clean clothes to the hospital/surgery center.  FAILURE TO FOLLOW THESE INSTRUCTIONS MAY RESULT IN THE CANCELLATION OF YOUR SURGERY PATIENT SIGNATURE_________________________________  NURSE SIGNATURE__________________________________  ________________________________________________________________________   [name]  An incentive spirometer is a tool that can help keep your lungs clear and active. This tool measures how well you are filling your lungs with each breath. Taking long deep breaths may help reverse or decrease the chance  of developing breathing (pulmonary) problems (especially infection) following:  A long period of time when you are unable to move or be active. BEFORE THE PROCEDURE   If the spirometer includes an indicator to show your best effort, your nurse or respiratory therapist will set it to a desired goal.  If possible, sit up straight or lean slightly forward. Try not to slouch.  Hold the incentive spirometer in an upright position. INSTRUCTIONS FOR USE  1. Sit on the edge of your bed if possible, or sit up as far as you can in bed or on  a chair. 2. Hold the incentive spirometer in an upright position. 3. Breathe out normally. 4. Place the mouthpiece in your mouth and seal your lips tightly around it. 5. Breathe in slowly and as deeply as possible, raising the piston or the ball toward the top of the column. 6. Hold your breath for 3-5 seconds or for as long as possible. Allow the piston or ball to fall to the bottom of the column. 7. Remove the mouthpiece from your mouth and breathe out normally. 8. Rest for a few seconds and repeat Steps 1 through 7 at least 10 times every 1-2 hours when you are awake. Take your time and take a few normal breaths between deep breaths. 9. The spirometer may include an indicator to show your best effort. Use the indicator as a goal to work toward during each repetition. 10. After each set of 10 deep breaths, practice coughing to be sure your lungs are clear. If you have an incision (the cut made at the time of surgery), support your incision when coughing by placing a pillow or rolled up towels firmly against it. Once you are able to get out of bed, walk around indoors and cough well. You may stop using the incentive spirometer when instructed by your caregiver.  RISKS AND COMPLICATIONS  Take your time so you do not get dizzy or light-headed.  If you are in pain, you may need to take or ask for pain medication before doing incentive spirometry. It is harder to  take a deep breath if you are having pain. AFTER USE  Rest and breathe slowly and easily.  It can be helpful to keep track of a log of your progress. Your caregiver can provide you with a simple table to help with this. If you are using the spirometer at home, follow these instructions: [place] IF:   You are having difficultly using the spirometer.  You have trouble using the spirometer as often as instructed.  Your pain medication is not giving enough relief while using the spirometer.  You develop fever of 100.5 F (38.1 C) or higher. SEEK IMMEDIATE MEDICAL CARE IF:   You cough up bloody sputum that had not been present before.  You develop fever of 102 F (38.9 C) or greater.  You develop worsening pain at or near the incision site. MAKE SURE YOU:   Understand these instructions.  Will watch your condition.  Will get help right away if you are not doing well or get worse. Document Released: 12/25/2006 Document Revised: 11/06/2011 Document Reviewed: 02/25/2007 [facility] Patient Information 2014 [place], [place].   ________________________________________________________________________

## 2019-06-16 ENCOUNTER — Other Ambulatory Visit: Payer: Self-pay

## 2019-06-16 ENCOUNTER — Encounter (HOSPITAL_COMMUNITY)
Admission: RE | Admit: 2019-06-16 | Discharge: 2019-06-16 | Disposition: A | Discharge status: Home / Self Care | Payer: Medicare Other | Source: Ambulatory Visit | Attending: Orthopedic Surgery | Admitting: Orthopedic Surgery

## 2019-06-16 ENCOUNTER — Encounter (HOSPITAL_COMMUNITY): Payer: Self-pay

## 2019-06-16 DIAGNOSIS — Z79899 Other long term (current) drug therapy: Secondary | ICD-10-CM | POA: Diagnosis not present

## 2019-06-16 DIAGNOSIS — E785 Hyperlipidemia, unspecified: Secondary | ICD-10-CM | POA: Insufficient documentation

## 2019-06-16 DIAGNOSIS — Z01812 Encounter for preprocedural laboratory examination: Secondary | ICD-10-CM | POA: Diagnosis not present

## 2019-06-16 DIAGNOSIS — E039 Hypothyroidism, unspecified: Secondary | ICD-10-CM | POA: Insufficient documentation

## 2019-06-16 DIAGNOSIS — M1612 Unilateral primary osteoarthritis, left hip: Secondary | ICD-10-CM | POA: Diagnosis not present

## 2019-06-16 DIAGNOSIS — Z7982 Long term (current) use of aspirin: Secondary | ICD-10-CM | POA: Diagnosis not present

## 2019-06-16 DIAGNOSIS — I10 Essential (primary) hypertension: Secondary | ICD-10-CM | POA: Diagnosis not present

## 2019-06-16 DIAGNOSIS — Z79890 Hormone replacement therapy (postmenopausal): Secondary | ICD-10-CM | POA: Diagnosis not present

## 2019-06-16 DIAGNOSIS — Z87891 Personal history of nicotine dependence: Secondary | ICD-10-CM | POA: Diagnosis not present

## 2019-06-16 HISTORY — DX: Hypothyroidism, unspecified: E03.9

## 2019-06-16 LAB — CBC
HCT: 43.7 % (ref 36.0–46.0)
Hemoglobin: 14.1 g/dL (ref 12.0–15.0)
MCH: 32.5 pg (ref 26.0–34.0)
MCHC: 32.3 g/dL (ref 30.0–36.0)
MCV: 100.7 fL — ABNORMAL HIGH (ref 80.0–100.0)
Platelets: 300 10*3/uL (ref 150–400)
RBC: 4.34 MIL/uL (ref 3.87–5.11)
RDW: 12.8 % (ref 11.5–15.5)
WBC: 8.8 10*3/uL (ref 4.0–10.5)
nRBC: 0 % (ref 0.0–0.2)

## 2019-06-16 LAB — URINALYSIS, ROUTINE W REFLEX MICROSCOPIC
Bilirubin Urine: NEGATIVE
Glucose, UA: NEGATIVE mg/dL
Hgb urine dipstick: NEGATIVE
Ketones, ur: NEGATIVE mg/dL
Leukocytes,Ua: NEGATIVE
Nitrite: NEGATIVE
Protein, ur: NEGATIVE mg/dL
Specific Gravity, Urine: 1.019 (ref 1.005–1.030)
pH: 5 (ref 5.0–8.0)

## 2019-06-16 LAB — SURGICAL PCR SCREEN
MRSA, PCR: NEGATIVE
Staphylococcus aureus: NEGATIVE

## 2019-06-16 LAB — BASIC METABOLIC PANEL
Anion gap: 8 (ref 5–15)
BUN: 17 mg/dL (ref 8–23)
CO2: 29 mmol/L (ref 22–32)
Calcium: 9.6 mg/dL (ref 8.9–10.3)
Chloride: 102 mmol/L (ref 98–111)
Creatinine, Ser: 0.81 mg/dL (ref 0.44–1.00)
GFR calc Af Amer: >60 mL/min (ref >60)
GFR calc non Af Amer: >60 mL/min (ref >60)
Glucose, Bld: 87 mg/dL (ref 70–99)
Potassium: 4.4 mmol/L (ref 3.5–5.1)
Sodium: 139 mmol/L (ref 135–145)

## 2019-06-16 NOTE — Progress Notes (Addendum)
PCP - Marrian Salvage, [place] Cardiologist - none  Chest x-ray - 04/21/2019 EKG - 04/21/2019 Stress Test - none ECHO - none Cardiac Cath - none  Sleep Study -none  CPAP - none  Fasting Blood Sugar - none Checks Blood Sugar _____ times a day  Blood Thinner Instructions:none Aspirin Instructions:Aspirin 81 mg prescribed for preventative by Marrian Salvage, FNP Last Dose:to instructed to call Jodi Mourning, FNP and get instructions from her as to when or if to stop taking the Aspirin .  Anesthesia review:   Patient has history of HTN, Hypothyroidism, heart murmur, 2010 nephrectomy.  Patient denies shortness of breath, fever, cough and chest pain at PAT appointment   Patient verbalized understanding of instructions that were given to them at the PAT appointment. Patient was also instructed that they will need to review over the PAT instructions again at home before surgery.

## 2019-06-17 NOTE — Progress Notes (Signed)
Anesthesia Chart Review   Case: [id number] Date/Time: 06/24/19 0715   Procedure: TOTAL HIP ARTHROPLASTY ANTERIOR APPROACH (Left Hip)   Anesthesia type: Choice   Pre-op diagnosis: OA LEFT HIP   Location: [place] 08 / [facility] ORS   Surgeon: Renette Butters, MD      DISCUSSION:73 y.o. former smoker (2 pack years, quit 08/29/75) with h/o PONV, HTN, HLD, hypothyroidism, heart murmur, s/p lumbar fusion with hardware in place L4-5, left hip OA scheduled for above procedure 06/24/2019 with Dr. Edmonia Lynch.    S/p lumbar 4-5 PSF 04/23/2019.  No anesthesia complications noted.  Per Anesthesia note, "Past diagnosis of heart murmur. I spoke with patient to clarify. She says she was told by Dr. Linna Darner many years ago that she had a faint murmur. Dr. Linna Darner has since retired and pt has established with several different PCPs in the interim and none have appreciated a murmur. I reviewed Dr. Clayborn Heron notes from 2013 and he did document a grade 1/2 out of 6 systolic murmur. Recent PCP notes have documented no murmur. Pt denies every having any CV symptoms. She is currently limited by her back pain but otherwise is able to go up multiple flights of stairs without issue. She has never had any advanced cardiac testing. EKG shows NSR. Denies any CV symptoms, no DOE, CP, Edema.  Pt also reports hx of severe PONV."  Anticipate pt can proceed with planned procedure barring acute status change.   VS: BP (!) 156/76   Pulse 88   Temp 36.9 C (Oral)   Resp 16   Ht 5\' 5"  (1.651 m)   Wt 72.9 kg   SpO2 100%   BMI 26.76 kg/m   PROVIDERS: Marrian Salvage, FNP is PCP    LABS: Labs reviewed: Acceptable for surgery. (all labs ordered are listed, but only abnormal results are displayed)  Labs Reviewed  CBC - Abnormal; Notable for the following components:      Result Value   MCV 100.7 (*)    All other components within normal limits  SURGICAL PCR SCREEN  BASIC METABOLIC PANEL  URINALYSIS, ROUTINE W REFLEX  MICROSCOPIC     IMAGES: Chest Xray 04/21/2019 FINDINGS: The heart size and mediastinal contours are within normal limits. Aortic atherosclerosis. Aortic atherosclerosis. Both lungs are clear. Degenerative disc disease noted in the lower thoracic and upper lumbar spine.  IMPRESSION: No active cardiopulmonary disease.  EKG: 04/21/2019 Rate 68 bpm Normal sinus rhythm with sinus arrhythmia  Normal ECG  CV:  Past Medical History:  Diagnosis Date  . Adenomatous colon polyp   . Arthritis   . Cataract   . Complication of anesthesia   . Heart murmur    MVP  . Hyperlipidemia   . Hypertension   . Hypothyroidism   . PONV (postoperative nausea and vomiting)   . Thyroid disease     Past Surgical History:  Procedure Laterality Date  . BREAST EXCISIONAL BIOPSY Right 1983  . COLONOSCOPY W/ POLYPECTOMY  2003,2006,2013   [name]  . DIAGNOSTIC LAPAROSCOPY  1979   exploratory to get pregnant  . DILATION AND CURETTAGE OF UTERUS     X2  . NEPHRECTOMY  2010   Left for 9.8cm benign complex cyst  . THYROIDECTOMY  2000   [name]  . TONSILLECTOMY    . TOTAL ABDOMINAL HYSTERECTOMY W/ BILATERAL SALPINGOOPHORECTOMY  2003   [name] for dysfunctional menses    MEDICATIONS: . acetaminophen (TYLENOL ARTHRITIS PAIN) 650 MG CR tablet  . ALPRAZolam (  XANAX) 0.25 MG tablet  . amLODipine (NORVASC) 5 MG tablet  . aspirin 81 MG tablet  . benazepril (LOTENSIN) 20 MG tablet  . gabapentin (NEURONTIN) 100 MG capsule  . levothyroxine (SYNTHROID) 75 MCG tablet  . metoprolol succinate (TOPROL-XL) 50 MG 24 hr tablet  . Misc Natural Products (TART CHERRY ADVANCED PO)  . Multiple Vitamins-Minerals (CENTRUM PO)  . oxyCODONE (OXY IR/ROXICODONE) 5 MG immediate release tablet  . potassium citrate (UROCIT-K 10) 10 MEQ (1080 MG) SR tablet  . rosuvastatin (CRESTOR) 20 MG tablet  . venlafaxine XR (EFFEXOR-XR) 37.5 MG 24 hr capsule   No current facility-administered medications for this encounter.       Maia Plan [facility] Pre-Surgical Testing [phone] 06/17/19  11:07 AM

## 2019-06-17 NOTE — Anesthesia Preprocedure Evaluation (Addendum)
Anesthesia Evaluation  Patient identified by MRN, date of birth, ID band Patient awake    Reviewed: Allergy & Precautions, NPO status , Patient's Chart, lab work & pertinent test results  History of Anesthesia Complications (+) PONV  Airway Mallampati: I  TM Distance: >3 FB Neck ROM: Full    Dental   Pulmonary former smoker,    Pulmonary exam normal        Cardiovascular hypertension, Pt. on medications Normal cardiovascular exam     Neuro/Psych Anxiety Depression    GI/Hepatic   Endo/Other    Renal/GU      Musculoskeletal   Abdominal   Peds  Hematology   Anesthesia Other Findings   Reproductive/Obstetrics                           Anesthesia Physical Anesthesia Plan  ASA: II  Anesthesia Plan: Spinal   Post-op Pain Management:    Induction: Intravenous  PONV Risk Score and Plan: 3 and Ondansetron, Midazolam and Treatment may vary due to age or medical condition  Airway Management Planned: Simple Face Mask  Additional Equipment:   Intra-op Plan:   Post-operative Plan:   Informed Consent: I have reviewed the patients History and Physical, chart, labs and discussed the procedure including the risks, benefits and alternatives for the proposed anesthesia with the patient or authorized representative who has indicated his/her understanding and acceptance.       Plan Discussed with: CRNA and Surgeon  Anesthesia Plan Comments: (See PAT note 06/16/2019, Konrad Felix, PA-C)       Anesthesia Quick Evaluation

## 2019-06-20 ENCOUNTER — Other Ambulatory Visit (HOSPITAL_COMMUNITY)
Admission: RE | Admit: 2019-06-20 | Discharge: 2019-06-20 | Disposition: A | Discharge status: Home / Self Care | Payer: Medicare Other | Source: Ambulatory Visit | Attending: Orthopedic Surgery | Admitting: Orthopedic Surgery

## 2019-06-20 DIAGNOSIS — Z01812 Encounter for preprocedural laboratory examination: Secondary | ICD-10-CM | POA: Insufficient documentation

## 2019-06-20 DIAGNOSIS — Z20828 Contact with and (suspected) exposure to other viral communicable diseases: Secondary | ICD-10-CM | POA: Insufficient documentation

## 2019-06-23 LAB — NOVEL CORONAVIRUS, NAA (HOSP ORDER, SEND-OUT TO REF LAB; TAT 18-24 HRS): SARS-CoV-2, NAA: NOT DETECTED

## 2019-06-23 MED ORDER — BUPIVACAINE LIPOSOME 1.3 % IJ SUSP
10.0000 mL | Freq: Once | INTRAMUSCULAR | Status: DC
  Filled 2019-06-23: qty 10

## 2019-06-24 ENCOUNTER — Encounter (HOSPITAL_COMMUNITY)
Admission: RE | Disposition: A | Discharge status: Home / Self Care | Payer: Self-pay | Source: Home / Self Care | Attending: Orthopedic Surgery

## 2019-06-24 ENCOUNTER — Inpatient Hospital Stay (HOSPITAL_COMMUNITY): Payer: Medicare Other

## 2019-06-24 ENCOUNTER — Other Ambulatory Visit: Payer: Self-pay

## 2019-06-24 ENCOUNTER — Inpatient Hospital Stay (HOSPITAL_COMMUNITY): Payer: Medicare Other | Admitting: Physician Assistant

## 2019-06-24 ENCOUNTER — Encounter (HOSPITAL_COMMUNITY): Payer: Self-pay | Admitting: *Deleted

## 2019-06-24 ENCOUNTER — Inpatient Hospital Stay (HOSPITAL_COMMUNITY)
Admission: RE | Admit: 2019-06-24 | Discharge: 2019-06-25 | DRG: 470 | Disposition: A | Discharge status: Home / Self Care | Payer: Medicare Other | Attending: Orthopedic Surgery | Admitting: Orthopedic Surgery

## 2019-06-24 ENCOUNTER — Inpatient Hospital Stay (HOSPITAL_COMMUNITY): Payer: Medicare Other | Admitting: Certified Registered"

## 2019-06-24 DIAGNOSIS — Z8349 Family history of other endocrine, nutritional and metabolic diseases: Secondary | ICD-10-CM | POA: Diagnosis not present

## 2019-06-24 DIAGNOSIS — Z791 Long term (current) use of non-steroidal anti-inflammatories (NSAID): Secondary | ICD-10-CM

## 2019-06-24 DIAGNOSIS — M1612 Unilateral primary osteoarthritis, left hip: Secondary | ICD-10-CM | POA: Diagnosis present

## 2019-06-24 DIAGNOSIS — E890 Postprocedural hypothyroidism: Secondary | ICD-10-CM | POA: Diagnosis present

## 2019-06-24 DIAGNOSIS — Z9079 Acquired absence of other genital organ(s): Secondary | ICD-10-CM

## 2019-06-24 DIAGNOSIS — Z841 Family history of disorders of kidney and ureter: Secondary | ICD-10-CM | POA: Diagnosis not present

## 2019-06-24 DIAGNOSIS — E785 Hyperlipidemia, unspecified: Secondary | ICD-10-CM | POA: Diagnosis present

## 2019-06-24 DIAGNOSIS — Z79890 Hormone replacement therapy (postmenopausal): Secondary | ICD-10-CM | POA: Diagnosis not present

## 2019-06-24 DIAGNOSIS — F419 Anxiety disorder, unspecified: Secondary | ICD-10-CM | POA: Diagnosis present

## 2019-06-24 DIAGNOSIS — Z905 Acquired absence of kidney: Secondary | ICD-10-CM

## 2019-06-24 DIAGNOSIS — F329 Major depressive disorder, single episode, unspecified: Secondary | ICD-10-CM | POA: Diagnosis present

## 2019-06-24 DIAGNOSIS — Z87891 Personal history of nicotine dependence: Secondary | ICD-10-CM | POA: Diagnosis not present

## 2019-06-24 DIAGNOSIS — Z7982 Long term (current) use of aspirin: Secondary | ICD-10-CM | POA: Diagnosis not present

## 2019-06-24 DIAGNOSIS — Z823 Family history of stroke: Secondary | ICD-10-CM | POA: Diagnosis not present

## 2019-06-24 DIAGNOSIS — I10 Essential (primary) hypertension: Secondary | ICD-10-CM | POA: Diagnosis present

## 2019-06-24 DIAGNOSIS — Z90710 Acquired absence of both cervix and uterus: Secondary | ICD-10-CM

## 2019-06-24 DIAGNOSIS — F32A Depression, unspecified: Secondary | ICD-10-CM | POA: Diagnosis present

## 2019-06-24 DIAGNOSIS — Z8249 Family history of ischemic heart disease and other diseases of the circulatory system: Secondary | ICD-10-CM

## 2019-06-24 DIAGNOSIS — Z90722 Acquired absence of ovaries, bilateral: Secondary | ICD-10-CM

## 2019-06-24 DIAGNOSIS — Z79899 Other long term (current) drug therapy: Secondary | ICD-10-CM

## 2019-06-24 DIAGNOSIS — Z981 Arthrodesis status: Secondary | ICD-10-CM | POA: Diagnosis not present

## 2019-06-24 DIAGNOSIS — Z881 Allergy status to other antibiotic agents status: Secondary | ICD-10-CM

## 2019-06-24 DIAGNOSIS — Z9089 Acquired absence of other organs: Secondary | ICD-10-CM

## 2019-06-24 DIAGNOSIS — Z86010 Personal history of colonic polyps: Secondary | ICD-10-CM

## 2019-06-24 DIAGNOSIS — Z885 Allergy status to narcotic agent status: Secondary | ICD-10-CM

## 2019-06-24 DIAGNOSIS — M1610 Unilateral primary osteoarthritis, unspecified hip: Secondary | ICD-10-CM | POA: Diagnosis present

## 2019-06-24 DIAGNOSIS — Z79891 Long term (current) use of opiate analgesic: Secondary | ICD-10-CM

## 2019-06-24 DIAGNOSIS — Z419 Encounter for procedure for purposes other than remedying health state, unspecified: Secondary | ICD-10-CM

## 2019-06-24 DIAGNOSIS — E782 Mixed hyperlipidemia: Secondary | ICD-10-CM | POA: Diagnosis present

## 2019-06-24 DIAGNOSIS — M25752 Osteophyte, left hip: Secondary | ICD-10-CM | POA: Diagnosis present

## 2019-06-24 HISTORY — PX: TOTAL HIP ARTHROPLASTY: SHX124

## 2019-06-24 SURGERY — ARTHROPLASTY, HIP, TOTAL, ANTERIOR APPROACH
Anesthesia: General | Site: Hip | Laterality: Left

## 2019-06-24 MED ORDER — SUGAMMADEX SODIUM 200 MG/2ML IV SOLN
INTRAVENOUS | Status: DC | PRN
  Administered 2019-06-24: 200 mg via INTRAVENOUS

## 2019-06-24 MED ORDER — HYDROCODONE-ACETAMINOPHEN 5-325 MG PO TABS
1.0000 | ORAL_TABLET | ORAL | Status: DC | PRN
  Administered 2019-06-24 – 2019-06-25 (×3): 1 via ORAL
  Filled 2019-06-24 (×3): qty 1

## 2019-06-24 MED ORDER — ONDANSETRON HCL 4 MG PO TABS: 4.0000 mg | ORAL_TABLET | Freq: Four times a day (QID) | ORAL | Status: DC | PRN

## 2019-06-24 MED ORDER — DEXAMETHASONE SODIUM PHOSPHATE 10 MG/ML IJ SOLN
10.0000 mg | Freq: Once | INTRAMUSCULAR | Status: AC
  Administered 2019-06-25: 10 mg via INTRAVENOUS
  Filled 2019-06-24: qty 1

## 2019-06-24 MED ORDER — GABAPENTIN 100 MG PO CAPS
100.0000 mg | ORAL_CAPSULE | Freq: Two times a day (BID) | ORAL | Status: DC
  Administered 2019-06-24 – 2019-06-25 (×3): 100 mg via ORAL
  Filled 2019-06-24 (×3): qty 1

## 2019-06-24 MED ORDER — GABAPENTIN 100 MG PO CAPS: 100.0000 mg | ORAL_CAPSULE | Freq: Two times a day (BID) | ORAL | 0 refills | Status: DC

## 2019-06-24 MED ORDER — CHLORHEXIDINE GLUCONATE 4 % EX LIQD: 60.0000 mL | Freq: Once | CUTANEOUS | Status: DC

## 2019-06-24 MED ORDER — PHENOL 1.4 % MT LIQD
1.0000 | OROMUCOSAL | Status: DC | PRN
  Filled 2019-06-24: qty 177

## 2019-06-24 MED ORDER — LACTATED RINGERS IV SOLN: INTRAVENOUS | Status: DC

## 2019-06-24 MED ORDER — TRANEXAMIC ACID-NACL 1000-0.7 MG/100ML-% IV SOLN
1000.0000 mg | INTRAVENOUS | Status: AC
  Administered 2019-06-24: 08:00:00 1000 mg via INTRAVENOUS
  Filled 2019-06-24: qty 100

## 2019-06-24 MED ORDER — POVIDONE-IODINE 10 % EX SWAB
2.0000 "application " | Freq: Once | CUTANEOUS | Status: AC
  Administered 2019-06-24: 2 via TOPICAL

## 2019-06-24 MED ORDER — DIPHENHYDRAMINE HCL 12.5 MG/5ML PO ELIX: 12.5000 mg | ORAL_SOLUTION | ORAL | Status: DC | PRN

## 2019-06-24 MED ORDER — METHOCARBAMOL 500 MG IVPB - SIMPLE MED
500.0000 mg | Freq: Four times a day (QID) | INTRAVENOUS | Status: DC | PRN
  Administered 2019-06-24: 500 mg via INTRAVENOUS
  Filled 2019-06-24: qty 50

## 2019-06-24 MED ORDER — ONDANSETRON HCL 4 MG/2ML IJ SOLN
INTRAMUSCULAR | Status: AC
  Filled 2019-06-24: qty 2

## 2019-06-24 MED ORDER — ONDANSETRON HCL 4 MG/2ML IJ SOLN
4.0000 mg | Freq: Four times a day (QID) | INTRAMUSCULAR | Status: DC | PRN
  Administered 2019-06-24: 4 mg via INTRAVENOUS
  Filled 2019-06-24: qty 2

## 2019-06-24 MED ORDER — ONDANSETRON HCL 4 MG PO TABS: 4.0000 mg | ORAL_TABLET | Freq: Three times a day (TID) | ORAL | 0 refills | Status: DC | PRN

## 2019-06-24 MED ORDER — PROPOFOL 10 MG/ML IV BOLUS
INTRAVENOUS | Status: DC | PRN
  Administered 2019-06-24: 140 mg via INTRAVENOUS

## 2019-06-24 MED ORDER — ROCURONIUM BROMIDE 10 MG/ML (PF) SYRINGE
PREFILLED_SYRINGE | INTRAVENOUS | Status: AC
  Filled 2019-06-24: qty 10

## 2019-06-24 MED ORDER — HYDROMORPHONE HCL 1 MG/ML IJ SOLN
INTRAMUSCULAR | Status: AC
  Filled 2019-06-24: qty 1

## 2019-06-24 MED ORDER — HYDROCODONE-ACETAMINOPHEN 5-325 MG PO TABS: 1.0000 | ORAL_TABLET | Freq: Four times a day (QID) | ORAL | 0 refills | Status: AC | PRN

## 2019-06-24 MED ORDER — METHOCARBAMOL 500 MG IVPB - SIMPLE MED
INTRAVENOUS | Status: AC
  Filled 2019-06-24: qty 50

## 2019-06-24 MED ORDER — CEFAZOLIN SODIUM-DEXTROSE 2-4 GM/100ML-% IV SOLN
2.0000 g | INTRAVENOUS | Status: AC
  Administered 2019-06-24: 2 g via INTRAVENOUS
  Filled 2019-06-24: qty 100

## 2019-06-24 MED ORDER — MIDAZOLAM HCL 2 MG/2ML IJ SOLN
INTRAMUSCULAR | Status: AC
  Filled 2019-06-24: qty 2

## 2019-06-24 MED ORDER — 0.9 % SODIUM CHLORIDE (POUR BTL) OPTIME
TOPICAL | Status: DC | PRN
  Administered 2019-06-24: 1000 mL

## 2019-06-24 MED ORDER — METOCLOPRAMIDE HCL 5 MG PO TABS: 5.0000 mg | ORAL_TABLET | Freq: Three times a day (TID) | ORAL | Status: DC | PRN

## 2019-06-24 MED ORDER — HYDROMORPHONE HCL 1 MG/ML IJ SOLN
0.2500 mg | INTRAMUSCULAR | Status: DC | PRN
  Administered 2019-06-24 (×4): 0.5 mg via INTRAVENOUS

## 2019-06-24 MED ORDER — MEPERIDINE HCL 50 MG/ML IJ SOLN: 6.2500 mg | INTRAMUSCULAR | Status: DC | PRN

## 2019-06-24 MED ORDER — SODIUM CHLORIDE (PF) 0.9 % IJ SOLN
INTRAMUSCULAR | Status: AC
  Filled 2019-06-24: qty 20

## 2019-06-24 MED ORDER — BENAZEPRIL HCL 10 MG PO TABS
30.0000 mg | ORAL_TABLET | Freq: Every day | ORAL | Status: DC
  Administered 2019-06-24: 30 mg via ORAL
  Filled 2019-06-24: qty 3

## 2019-06-24 MED ORDER — BUPIVACAINE LIPOSOME 1.3 % IJ SUSP
INTRAMUSCULAR | Status: DC | PRN
  Administered 2019-06-24: 10 mL

## 2019-06-24 MED ORDER — DEXAMETHASONE SODIUM PHOSPHATE 10 MG/ML IJ SOLN
INTRAMUSCULAR | Status: AC
  Filled 2019-06-24: qty 1

## 2019-06-24 MED ORDER — POLYETHYLENE GLYCOL 3350 17 G PO PACK: 17.0000 g | PACK | Freq: Every day | ORAL | Status: DC | PRN

## 2019-06-24 MED ORDER — LIDOCAINE 2% (20 MG/ML) 5 ML SYRINGE
INTRAMUSCULAR | Status: AC
  Filled 2019-06-24: qty 5

## 2019-06-24 MED ORDER — POTASSIUM CITRATE ER 10 MEQ (1080 MG) PO TBCR
10.0000 meq | EXTENDED_RELEASE_TABLET | Freq: Every day | ORAL | Status: DC
  Administered 2019-06-24 – 2019-06-25 (×2): 10 meq via ORAL
  Filled 2019-06-24 (×2): qty 1

## 2019-06-24 MED ORDER — METOCLOPRAMIDE HCL 5 MG/ML IJ SOLN: 5.0000 mg | Freq: Three times a day (TID) | INTRAMUSCULAR | Status: DC | PRN

## 2019-06-24 MED ORDER — FENTANYL CITRATE (PF) 250 MCG/5ML IJ SOLN
INTRAMUSCULAR | Status: DC | PRN
  Administered 2019-06-24 (×2): 50 ug via INTRAVENOUS
  Administered 2019-06-24: 100 ug via INTRAVENOUS

## 2019-06-24 MED ORDER — ACETAMINOPHEN 500 MG PO TABS
1000.0000 mg | ORAL_TABLET | Freq: Once | ORAL | Status: AC
  Administered 2019-06-24: 1000 mg via ORAL
  Filled 2019-06-24: qty 2

## 2019-06-24 MED ORDER — FENTANYL CITRATE (PF) 100 MCG/2ML IJ SOLN
INTRAMUSCULAR | Status: AC
  Filled 2019-06-24: qty 2

## 2019-06-24 MED ORDER — METHOCARBAMOL 500 MG PO TABS
500.0000 mg | ORAL_TABLET | Freq: Four times a day (QID) | ORAL | Status: DC | PRN
  Filled 2019-06-24: qty 1

## 2019-06-24 MED ORDER — SCOPOLAMINE 1 MG/3DAYS TD PT72
MEDICATED_PATCH | TRANSDERMAL | Status: DC | PRN
  Administered 2019-06-24: 1 via TRANSDERMAL

## 2019-06-24 MED ORDER — PHENYLEPHRINE HCL (PRESSORS) 10 MG/ML IV SOLN
INTRAVENOUS | Status: AC
  Filled 2019-06-24: qty 1

## 2019-06-24 MED ORDER — HYDROMORPHONE HCL 1 MG/ML IJ SOLN: 0.5000 mg | INTRAMUSCULAR | Status: DC | PRN

## 2019-06-24 MED ORDER — LACTATED RINGERS IV SOLN
INTRAVENOUS | Status: DC
  Administered 2019-06-24: 10:00:00 1000 mL via INTRAVENOUS
  Administered 2019-06-24: 06:00:00 via INTRAVENOUS

## 2019-06-24 MED ORDER — DEXAMETHASONE SODIUM PHOSPHATE 10 MG/ML IJ SOLN
INTRAMUSCULAR | Status: DC | PRN
  Administered 2019-06-24: 10 mg via INTRAVENOUS

## 2019-06-24 MED ORDER — BACLOFEN 10 MG PO TABS: 10.0000 mg | ORAL_TABLET | Freq: Three times a day (TID) | ORAL | 0 refills | Status: DC | PRN

## 2019-06-24 MED ORDER — CEFAZOLIN SODIUM-DEXTROSE 1-4 GM/50ML-% IV SOLN
1.0000 g | Freq: Four times a day (QID) | INTRAVENOUS | Status: AC
  Administered 2019-06-24 (×2): 1 g via INTRAVENOUS
  Filled 2019-06-24 (×2): qty 50

## 2019-06-24 MED ORDER — ROCURONIUM BROMIDE 10 MG/ML (PF) SYRINGE
PREFILLED_SYRINGE | INTRAVENOUS | Status: DC | PRN
  Administered 2019-06-24: 10 mg via INTRAVENOUS
  Administered 2019-06-24: 60 mg via INTRAVENOUS

## 2019-06-24 MED ORDER — ONDANSETRON HCL 4 MG/2ML IJ SOLN: 4.0000 mg | Freq: Once | INTRAMUSCULAR | Status: DC | PRN

## 2019-06-24 MED ORDER — ALPRAZOLAM 0.25 MG PO TABS
0.2500 mg | ORAL_TABLET | Freq: Every day | ORAL | Status: DC
  Administered 2019-06-24: 0.25 mg via ORAL
  Filled 2019-06-24: qty 1

## 2019-06-24 MED ORDER — METOPROLOL SUCCINATE ER 50 MG PO TB24
50.0000 mg | ORAL_TABLET | Freq: Every day | ORAL | Status: DC
  Administered 2019-06-24: 22:00:00 50 mg via ORAL
  Filled 2019-06-24: qty 1

## 2019-06-24 MED ORDER — SORBITOL 70 % SOLN
30.0000 mL | Freq: Every day | Status: DC | PRN
  Filled 2019-06-24: qty 30

## 2019-06-24 MED ORDER — ASPIRIN 81 MG PO CHEW
81.0000 mg | CHEWABLE_TABLET | Freq: Two times a day (BID) | ORAL | Status: DC
  Administered 2019-06-24 – 2019-06-25 (×2): 81 mg via ORAL
  Filled 2019-06-24 (×2): qty 1

## 2019-06-24 MED ORDER — PROPOFOL 10 MG/ML IV BOLUS
INTRAVENOUS | Status: AC
  Filled 2019-06-24: qty 40

## 2019-06-24 MED ORDER — DOCUSATE SODIUM 100 MG PO CAPS
100.0000 mg | ORAL_CAPSULE | Freq: Two times a day (BID) | ORAL | Status: DC
  Administered 2019-06-24 – 2019-06-25 (×3): 100 mg via ORAL
  Filled 2019-06-24 (×3): qty 1

## 2019-06-24 MED ORDER — MAGNESIUM CITRATE PO SOLN: 1.0000 | Freq: Once | ORAL | Status: DC | PRN

## 2019-06-24 MED ORDER — LIDOCAINE 2% (20 MG/ML) 5 ML SYRINGE
INTRAMUSCULAR | Status: DC | PRN
  Administered 2019-06-24: 40 mg via INTRAVENOUS

## 2019-06-24 MED ORDER — SODIUM CHLORIDE FLUSH 0.9 % IV SOLN
INTRAVENOUS | Status: DC | PRN
  Administered 2019-06-24: 20 mL

## 2019-06-24 MED ORDER — PHENYLEPHRINE 40 MCG/ML (10ML) SYRINGE FOR IV PUSH (FOR BLOOD PRESSURE SUPPORT)
PREFILLED_SYRINGE | INTRAVENOUS | Status: DC | PRN
  Administered 2019-06-24 (×3): 80 ug via INTRAVENOUS

## 2019-06-24 MED ORDER — ASPIRIN EC 81 MG PO TBEC: 81.0000 mg | DELAYED_RELEASE_TABLET | Freq: Two times a day (BID) | ORAL | 0 refills | Status: DC

## 2019-06-24 MED ORDER — LEVOTHYROXINE SODIUM 75 MCG PO TABS
75.0000 ug | ORAL_TABLET | Freq: Every day | ORAL | Status: DC
  Administered 2019-06-25: 75 ug via ORAL
  Filled 2019-06-24: qty 1

## 2019-06-24 MED ORDER — MENTHOL 3 MG MT LOZG: 1.0000 | LOZENGE | OROMUCOSAL | Status: DC | PRN

## 2019-06-24 MED ORDER — AMLODIPINE BESYLATE 5 MG PO TABS
5.0000 mg | ORAL_TABLET | Freq: Every day | ORAL | Status: DC
  Administered 2019-06-24: 5 mg via ORAL
  Filled 2019-06-24: qty 1

## 2019-06-24 MED ORDER — ACETAMINOPHEN 500 MG PO TABS
500.0000 mg | ORAL_TABLET | Freq: Four times a day (QID) | ORAL | Status: AC
  Administered 2019-06-24 – 2019-06-25 (×4): 500 mg via ORAL
  Filled 2019-06-24 (×4): qty 1

## 2019-06-24 MED ORDER — ROSUVASTATIN CALCIUM 20 MG PO TABS
20.0000 mg | ORAL_TABLET | Freq: Every day | ORAL | Status: DC
  Administered 2019-06-24 – 2019-06-25 (×2): 20 mg via ORAL
  Filled 2019-06-24 (×2): qty 1

## 2019-06-24 MED ORDER — ACETAMINOPHEN 325 MG PO TABS: 325.0000 mg | ORAL_TABLET | Freq: Four times a day (QID) | ORAL | Status: DC | PRN

## 2019-06-24 MED ORDER — SCOPOLAMINE 1 MG/3DAYS TD PT72
MEDICATED_PATCH | TRANSDERMAL | Status: AC
  Filled 2019-06-24: qty 1

## 2019-06-24 MED ORDER — ONDANSETRON HCL 4 MG/2ML IJ SOLN
INTRAMUSCULAR | Status: DC | PRN
  Administered 2019-06-24: 4 mg via INTRAVENOUS

## 2019-06-24 SURGICAL SUPPLY — 38 items
APL PRP STRL LF DISP 70% ISPRP (MISCELLANEOUS) ×1
BLADE SAG 18X100X1.27 (BLADE) IMPLANT
CHLORAPREP W/TINT 26 (MISCELLANEOUS) ×2 IMPLANT
CLSR STERI-STRIP ANTIMIC 1/2X4 (GAUZE/BANDAGES/DRESSINGS) ×2 IMPLANT
COVER PERINEAL POST (MISCELLANEOUS) ×2 IMPLANT
COVER SURGICAL LIGHT HANDLE (MISCELLANEOUS) ×2 IMPLANT
COVER WAND RF STERILE (DRAPES) IMPLANT
DECANTER SPIKE VIAL GLASS SM (MISCELLANEOUS) ×4 IMPLANT
DRAPE IMP U-DRAPE 54X76 (DRAPES) ×2 IMPLANT
DRAPE STERI IOBAN 125X83 (DRAPES) ×2 IMPLANT
DRAPE U-SHAPE 47X51 STRL (DRAPES) ×4 IMPLANT
DRSG MEPILEX BORDER 4X8 (GAUZE/BANDAGES/DRESSINGS) ×2 IMPLANT
ELECT BLADE TIP CTD 4 INCH (ELECTRODE) ×1 IMPLANT
GLOVE BIO SURGEON STRL SZ7.5 (GLOVE) ×4 IMPLANT
GLOVE BIOGEL PI IND STRL 8 (GLOVE) ×2 IMPLANT
GLOVE BIOGEL PI INDICATOR 8 (GLOVE) ×2
GOWN STRL REUS W/TWL LRG LVL3 (GOWN DISPOSABLE) ×2 IMPLANT
GOWN STRL REUS W/TWL XL LVL3 (GOWN DISPOSABLE) ×2 IMPLANT
HEAD BIOLOX HIP 36/-2.5 (Joint) IMPLANT
HIP BIOLOX HD 36/-2.5 (Joint) ×2 IMPLANT
INSERT TRIDENT POLY 36MM 0DEG (Insert) ×1 IMPLANT
KIT TURNOVER KIT A (KITS) IMPLANT
MANIFOLD NEPTUNE II (INSTRUMENTS) ×2 IMPLANT
NS IRRIG 1000ML POUR BTL (IV SOLUTION) ×2 IMPLANT
PACK ANTERIOR HIP CUSTOM (KITS) ×2 IMPLANT
PROTECTOR NERVE ULNAR (MISCELLANEOUS) ×2 IMPLANT
SCREW HEX LP 6.5X20 (Screw) ×1 IMPLANT
SHELL CLUSTERHOLE ACETABULAR 5 (Shell) ×1 IMPLANT
STEM HIP 127 DEG (Stem) ×1 IMPLANT
SUT MNCRL AB 4-0 PS2 18 (SUTURE) ×2 IMPLANT
SUT STRATAFIX 0 PDS 27 VIOLET (SUTURE) ×2
SUT VIC AB 0 CT1 36 (SUTURE) ×2 IMPLANT
SUT VIC AB 1 CT1 36 (SUTURE) ×2 IMPLANT
SUT VIC AB 2-0 CT1 27 (SUTURE) ×4
SUT VIC AB 2-0 CT1 TAPERPNT 27 (SUTURE) ×2 IMPLANT
SUTURE STRATFX 0 PDS 27 VIOLET (SUTURE) ×1 IMPLANT
WATER STERILE IRR 1000ML POUR (IV SOLUTION) ×4 IMPLANT
YANKAUER SUCT BULB TIP 10FT TU (MISCELLANEOUS) ×2 IMPLANT

## 2019-06-24 NOTE — Anesthesia Procedure Notes (Signed)
Procedure Name: Intubation Date/Time: 06/24/2019 7:53 AM Performed by: Eben Burow, CRNA Pre-anesthesia Checklist: Patient identified, Emergency Drugs available, Suction available, Patient being monitored and Timeout performed Patient Re-evaluated:Patient Re-evaluated prior to induction Oxygen Delivery Method: Circle system utilized Preoxygenation: Pre-oxygenation with 100% oxygen Induction Type: IV induction Ventilation: Mask ventilation without difficulty and Oral airway inserted - appropriate to patient size Laryngoscope Size: 4 and Glidescope Tube type: Oral Number of attempts: 1 Airway Equipment and Method: Stylet Placement Confirmation: ETT inserted through vocal cords under direct vision,  positive ETCO2 and breath sounds checked- equal and bilateral Secured at: 21 cm Tube secured with: Tape Dental Injury: Teeth and Oropharynx as per pre-operative assessment  Comments: Easy mask with oral airway, DVL x 1 by Jefm Miles, Pt anterior, CRNA could not visualize cords , DVL x 2 by Dr Conrad Long[place], Glidescope x 1 by Jefm Miles, CRNA with view of base of cords. +/= BBS, + EtCO2.

## 2019-06-24 NOTE — Evaluation (Signed)
Physical Therapy Evaluation Patient Details Name: [name] MRN: [id number] DOB: 1945-09-26 Today's Date: 06/24/2019   History of Present Illness  [name] 06/24/19; PMH of back surgery 03/2019, L nephrectomy, HTN  Clinical Impression  Pt is s/p THA resulting in the deficits listed below (see PT Problem List). Pt ambulated 100' with RW, no loss of balance. Initiated THA HEP. Good progress expected.  Pt will benefit from skilled PT to increase their independence and safety with mobility to allow discharge to the venue listed below.      Follow Up Recommendations Follow surgeon's recommendation for DC plan and follow-up therapies    Equipment Recommendations  None recommended by PT    Recommendations for Other Services       Precautions / Restrictions Precautions Precautions: Fall Restrictions Weight Bearing Restrictions: No Other Position/Activity Restrictions: WBAT      Mobility  Bed Mobility Overal bed mobility: Needs Assistance Bed Mobility: Supine to Sit     Supine to sit: HOB elevated;Min guard     General bed mobility comments: min/guard for LLE  Transfers Overall transfer level: Needs assistance Equipment used: Rolling walker (2 wheeled) Transfers: Sit to/from Stand Sit to Stand: From elevated surface;Min guard         General transfer comment: VCs hand placment  Ambulation/Gait Ambulation/Gait assistance: Min guard Gait Distance (Feet): 90 Feet Assistive device: Rolling walker (2 wheeled) Gait Pattern/deviations: Step-through pattern;Decreased stride length Gait velocity: decr   General Gait Details: VCs sequencing, no loss of balance  Stairs            Wheelchair Mobility    Modified Rankin (Stroke Patients Only)       Balance Overall balance assessment: Modified Independent                                           Pertinent Vitals/Pain Pain Assessment: No/denies pain    Home Living Family/patient expects  to be discharged to:: Private residence Living Arrangements: Spouse/significant other   Type of Home: House Home Access: Level entry     Home Layout: Multi-level Home Equipment: Environmental consultant - 2 wheels;Bedside commode      Prior Function Level of Independence: Independent               Hand Dominance        Extremity/Trunk Assessment   Upper Extremity Assessment Upper Extremity Assessment: Overall WFL for tasks assessed    Lower Extremity Assessment Lower Extremity Assessment: LLE deficits/detail LLE Deficits / Details: knee ext -3/5, hip ~2/5 limited by pain LLE Sensation: WNL    Cervical / Trunk Assessment Cervical / Trunk Assessment: Normal  Communication   Communication: No difficulties  Cognition Arousal/Alertness: Awake/alert Behavior During Therapy: WFL for tasks assessed/performed Overall Cognitive Status: Within Functional Limits for tasks assessed                                        General Comments      Exercises Total Joint Exercises Ankle Circles/Pumps: AROM;Both;10 reps;Supine Quad Sets: AROM;Left;5 reps;Supine Heel Slides: AAROM;Left;10 reps;Supine Hip ABduction/ADduction: AAROM;Left;10 reps;Supine   Assessment/Plan    PT Assessment Patient needs continued PT services  PT Problem List Decreased strength;Decreased activity tolerance;Decreased knowledge of use of DME;Decreased mobility       PT Treatment  Interventions DME instruction;Gait training;Stair training;Functional mobility training;Therapeutic exercise;Therapeutic activities;Patient/family education    PT Goals (Current goals can be found in the Care Plan section)  Acute Rehab PT Goals Patient Stated Goal: walk farther PT Goal Formulation: With patient Time For Goal Achievement: 06/30/19 Potential to Achieve Goals: Good    Frequency 7X/week   Barriers to discharge        Co-evaluation               AM-PAC PT "6 Clicks" Mobility  Outcome Measure  Help needed turning from your back to your side while in a flat bed without using bedrails?: A Little Help needed moving from lying on your back to sitting on the side of a flat bed without using bedrails?: A Little Help needed moving to and from a bed to a chair (including a wheelchair)?: A Little Help needed standing up from a chair using your arms (e.g., wheelchair or bedside chair)?: A Little Help needed to walk in hospital room?: A Little Help needed climbing 3-5 steps with a railing? : A Lot 6 Click Score: 17    End of Session Equipment Utilized During Treatment: Gait belt Activity Tolerance: Patient tolerated treatment well Patient left: in chair;with call bell/phone within reach;with chair alarm set;with nursing/sitter in room Nurse Communication: Mobility status PT Visit Diagnosis: Muscle weakness (generalized) (M62.81);Difficulty in walking, not elsewhere classified (R26.2)    Time: [id number] PT Time Calculation (min) (ACUTE ONLY): 26 min   Charges:   PT Evaluation $PT Eval Low Complexity: 1 Low PT Treatments $Gait Training: 8-22 mins   Blondell Reveal Kistler PT 06/24/2019  Acute Rehabilitation Services Pager [phone] Office [phone]

## 2019-06-24 NOTE — Transfer of Care (Signed)
Immediate Anesthesia Transfer of Care Note  Patient: [name]  Procedure(s) Performed: TOTAL HIP ARTHROPLASTY ANTERIOR APPROACH (Left Hip)  Patient Location: PACU  Anesthesia Type:General  Level of Consciousness: awake, alert  and oriented  Airway & Oxygen Therapy: Patient Spontanous Breathing and Patient connected to face mask oxygen  Post-op Assessment: Report given to RN and Post -op Vital signs reviewed and stable  Post vital signs: Reviewed and stable  Last Vitals:  Vitals Value Taken Time  BP 158/76 06/24/19 0932  Temp    Pulse 76 06/24/19 0932  Resp 12 06/24/19 0932  SpO2 100 % 06/24/19 0932  Vitals shown include unvalidated device data.  Last Pain:  Vitals:   06/24/19 0622  TempSrc: Oral         Complications: No apparent anesthesia complications

## 2019-06-24 NOTE — Plan of Care (Signed)
Plan of care reviewed and discussed with the patient. 

## 2019-06-24 NOTE — Interval H&P Note (Signed)
I participated in the care of this patient and agree with the above history, physical and evaluation. I performed a review of the history and a physical exam as detailed   Haze Antillon Daniel Isak Sotomayor MD  

## 2019-06-24 NOTE — Discharge Instructions (Signed)

## 2019-06-24 NOTE — Anesthesia Postprocedure Evaluation (Addendum)
Anesthesia Post Note  Patient: HATSUYE KNICKREHM  Procedure(s) Performed: TOTAL HIP ARTHROPLASTY ANTERIOR APPROACH (Left Hip)     Patient location during evaluation: PACU Anesthesia Type: General Level of consciousness: awake and alert Pain management: pain level controlled Vital Signs Assessment: post-procedure vital signs reviewed and stable Respiratory status: spontaneous breathing, nonlabored ventilation, respiratory function stable and patient connected to nasal cannula oxygen Cardiovascular status: blood pressure returned to baseline and stable Postop Assessment: no apparent nausea or vomiting Anesthetic complications: no    Last Vitals:  Vitals:   06/24/19 1126 06/24/19 1230  BP: 138/76 129/67  Pulse: 75 70  Resp: 14 16  Temp: (!) 36.4 C (!) 36.4 C  SpO2: 100% 100%    Last Pain:  Vitals:   06/24/19 1230  TempSrc: Axillary  PainSc:                  Galaxy Borden DAVID

## 2019-06-24 NOTE — Plan of Care (Signed)
Plan of care 

## 2019-06-24 NOTE — Op Note (Signed)
06/24/2019  8:59 AM  PATIENT:  [name]   MRN: [id number]  PRE-OPERATIVE DIAGNOSIS:  OSTEOARTHRITIS LEFT HIP  POST-OPERATIVE DIAGNOSIS:  OSTEOARTHRITIS LEFT HIP  PROCEDURE:  Procedure(s): TOTAL HIP ARTHROPLASTY ANTERIOR APPROACH  PREOPERATIVE INDICATIONS:    CHESTINA KOMATSU is an 73 y.o. female who has a diagnosis of Primary osteoarthritis of left hip and elected for surgical management after failing conservative treatment.  The risks benefits and alternatives were discussed with the patient including but not limited to the risks of nonoperative treatment, versus surgical intervention including infection, bleeding, nerve injury, periprosthetic fracture, the need for revision surgery, dislocation, leg length discrepancy, blood clots, cardiopulmonary complications, morbidity, mortality, among others, and they were willing to proceed.     OPERATIVE REPORT     SURGEON:   Renette Butters, MD    ASSISTANT:  Roxan Hockey, PA-C, he was present and scrubbed throughout the case, critical for completion in a timely fashion, and for retraction, instrumentation, and closure.     ANESTHESIA:  General    COMPLICATIONS:  None.     COMPONENTS:  [employer] acolade fit femur size 5 with a 36 mm -2.5 head ball and an acetabular shell size 52 with a  polyethylene liner    PROCEDURE IN DETAIL:   The patient was met in the holding area and  identified.  The appropriate hip was identified and marked at the operative site.  The patient was then transported to the OR  and  placed under anesthesia per that record.  At that point, the patient was  placed in the supine position and  secured to the operating room table and all bony prominences padded. He received pre-operative antibiotics    The operative lower extremity was prepped from the iliac crest to the distal leg.  Sterile draping was performed.  Time out was performed prior to incision.      Skin incision was made just 2 cm lateral to the ASIS   extending in line with the tensor fascia lata. Electrocautery was used to control all bleeders. I dissected down sharply to the fascia of the tensor fascia lata was confirmed that the muscle fibers beneath were running posteriorly. I then incised the fascia over the superficial tensor fascia lata in line with the incision. The fascia was elevated off the anterior aspect of the muscle the muscle was retracted posteriorly and protected throughout the case. I then used electrocautery to incise the tensor fascia lata fascia control and all bleeders. Immediately visible was the fat over top of the anterior neck and capsule.  I removed the anterior fat from the capsule and elevated the rectus muscle off of the anterior capsule. I then removed a large time of capsule. The retractors were then placed over the anterior acetabulum as well as around the superior and inferior neck.  I then made a femoral neck cut. Then used the power corkscrew to remove the femoral head from the acetabulum and thoroughly irrigated the acetabulum. I sized the femoral head.    I then exposed the deep acetabulum, cleared out any tissue including the ligamentum teres.   After adequate visualization, I excised the labrum, and then sequentially reamed.  I then impacted the acetabular implant into place using fluoroscopy for guidance.  Appropriate version and inclination was confirmed clinically matching their bony anatomy, and with fluoroscopy.  I placed a 20 mm screw in the posterior/superio position with an excellent bite.    I then placed the polyethylene liner  in place  I then adducted the leg and released the external rotators from the posterior femur allowing it to be easily delivered up lateral and anterior to the acetabulum for preparation of the femoral canal.    I then prepared the proximal femur using the cookie-cutter and then sequentially reamed and broached.  A trial broach, neck, and head was utilized, and I reduced the  hip and used floroscopy to assess the neck length and femoral implant.  I then impacted the femoral prosthesis into place into the appropriate version. The hip was then reduced and fluoroscopy confirmed appropriate position. Leg lengths were restored.  I then irrigated the hip copiously again with, and repaired the fascia with Vicryl, followed by monocryl for the subcutaneous tissue, Monocryl for the skin, Steri-Strips and sterile gauze. The patient was then awakened and returned to PACU in stable and satisfactory condition. There were no complications.  POST OPERATIVE PLAN: WBAT, DVT px: SCD's/TED, ambulation and chemical dvt px  Edmonia Lynch, MD Orthopedic Surgeon [phone]

## 2019-06-25 ENCOUNTER — Other Ambulatory Visit: Payer: Self-pay | Admitting: Family

## 2019-06-25 ENCOUNTER — Encounter: Payer: Self-pay | Admitting: Family

## 2019-06-25 ENCOUNTER — Encounter (HOSPITAL_COMMUNITY): Payer: Self-pay | Admitting: Orthopedic Surgery

## 2019-06-25 DIAGNOSIS — E890 Postprocedural hypothyroidism: Secondary | ICD-10-CM

## 2019-06-25 NOTE — Progress Notes (Signed)
Physical Therapy Treatment Patient Details Name: [name] MRN: [id number] DOB: 1946/02/11 Today's Date: 06/25/2019    History of Present Illness [name] 06/24/19; PMH of back surgery 03/2019, L nephrectomy, HTN    PT Comments    Pt has met PT goals and is ready to DC home from PT standpoint. She ambulated 200', demonstrates good understanding of HEP, and completed stair training.   Follow Up Recommendations  Follow surgeon's recommendation for DC plan and follow-up therapies     Equipment Recommendations  None recommended by PT    Recommendations for Other Services       Precautions / Restrictions Precautions Precautions: Fall Restrictions Weight Bearing Restrictions: No Other Position/Activity Restrictions: WBAT    Mobility  Bed Mobility Overal bed mobility: Needs Assistance Bed Mobility: Supine to Sit     Supine to sit: HOB elevated;Supervision     General bed mobility comments: self assisted LLE with belt looped around L foot  Transfers Overall transfer level: Needs assistance Equipment used: Rolling walker (2 wheeled) Transfers: Sit to/from Stand Sit to Stand: Supervision         General transfer comment: VCs hand placment  Ambulation/Gait Ambulation/Gait assistance: Supervision Gait Distance (Feet): 200 Feet Assistive device: Rolling walker (2 wheeled) Gait Pattern/deviations: Step-through pattern;Decreased stride length Gait velocity: decr   General Gait Details: VCs sequencing, no loss of balance   Stairs Stairs: Yes Stairs assistance: Min assist Stair Management: One rail Right;Step to pattern;Forwards;With cane Number of Stairs: 6 General stair comments: 3 steps x 2 trials, VCs sequencing   Wheelchair Mobility    Modified Rankin (Stroke Patients Only)       Balance Overall balance assessment: Modified Independent                                          Cognition Arousal/Alertness: Awake/alert Behavior  During Therapy: WFL for tasks assessed/performed Overall Cognitive Status: Within Functional Limits for tasks assessed                                        Exercises Total Joint Exercises Ankle Circles/Pumps: AROM;Both;10 reps;Supine Quad Sets: AROM;Left;5 reps;Supine Short Arc Quad: AROM;Left;10 reps;Supine Heel Slides: AAROM;Left;10 reps;Supine Hip ABduction/ADduction: AAROM;Left;10 reps;Supine Long Arc Quad: AROM;Right;10 reps;Seated    General Comments        Pertinent Vitals/Pain Pain Assessment: 0-10 Pain Score: 8  Pain Location: L hip Pain Descriptors / Indicators: Sore Pain Intervention(s): Limited activity within patient's tolerance;Monitored during session;Premedicated before session;Ice applied    Home Living                      Prior Function            PT Goals (current goals can now be found in the care plan section) Acute Rehab PT Goals Patient Stated Goal: walk farther PT Goal Formulation: With patient Time For Goal Achievement: 06/30/19 Potential to Achieve Goals: Good Progress towards PT goals: Progressing toward goals    Frequency    7X/week      PT Plan Current plan remains appropriate    Co-evaluation              AM-PAC PT "6 Clicks" Mobility   Outcome Measure  Help needed turning from your back to your  side while in a flat bed without using bedrails?: None Help needed moving from lying on your back to sitting on the side of a flat bed without using bedrails?: A Little Help needed moving to and from a bed to a chair (including a wheelchair)?: None Help needed standing up from a chair using your arms (e.g., wheelchair or bedside chair)?: None Help needed to walk in hospital room?: None Help needed climbing 3-5 steps with a railing? : A Little 6 Click Score: 22    End of Session Equipment Utilized During Treatment: Gait belt Activity Tolerance: Patient tolerated treatment well Patient left: in  chair;with call bell/phone within reach Nurse Communication: Mobility status PT Visit Diagnosis: Muscle weakness (generalized) (M62.81);Difficulty in walking, not elsewhere classified (R26.2)     Time: 4492-0100 PT Time Calculation (min) (ACUTE ONLY): 29 min  Charges:  $Gait Training: 8-22 mins $Therapeutic Exercise: 8-22 mins                     [name] PT 06/25/2019  Acute Rehabilitation Services Pager [phone] Office [phone]

## 2019-06-25 NOTE — Discharge Summary (Signed)
Discharge Summary  Patient ID: [name] MRN: [id number] DOB/AGE: 03-Mar-1946 73 y.o.  Admit date: 06/24/2019 Discharge date: 06/25/2019  Admission Diagnoses:  Primary osteoarthritis of left hip  Discharge Diagnoses:  Principal Problem:   Primary osteoarthritis of left hip Active Problems:   HYPERLIPIDEMIA   Essential hypertension   Anxiety and depression   S/P lumbar fusion   Primary localized osteoarthritis of hip   Past Medical History:  Diagnosis Date  . Adenomatous colon polyp   . Arthritis   . Cataract   . Complication of anesthesia   . Heart murmur    MVP  . Hyperlipidemia   . Hypertension   . Hypothyroidism   . PONV (postoperative nausea and vomiting)   . Thyroid disease     Surgeries: Procedure(s): TOTAL HIP ARTHROPLASTY ANTERIOR APPROACH on 06/24/2019   Consultants (if any):   Discharged Condition: Improved  Hospital Course: [name] is an 73 y.o. female who was admitted 06/24/2019 with a diagnosis of Primary osteoarthritis of left hip and went to the operating room on 06/24/2019 and underwent the above named procedures.    She was given perioperative antibiotics:  Anti-infectives (From admission, onward)   Start     Dose/Rate Route Frequency Ordered Stop   06/24/19 1400  ceFAZolin (ANCEF) IVPB 1 g/50 mL premix     1 g 100 mL/hr over 30 Minutes Intravenous Every 6 hours 06/24/19 1042 06/24/19 2043   06/24/19 0600  ceFAZolin (ANCEF) IVPB 2g/100 mL premix     2 g 200 mL/hr over 30 Minutes Intravenous On call to O.R. 06/24/19 [id number] 06/24/19 0754    .  She was given sequential compression devices, early ambulation, and Aspirin for DVT prophylaxis.  She benefited maximally from the hospital stay and there were no complications.    Recent vital signs:  Vitals:   06/25/19 0123 06/25/19 0525  BP: 127/66 131/63  Pulse: 77 77  Resp: 16 16  Temp: 98.5 F (36.9 C) 98 F (36.7 C)  SpO2: 93% 96%    Recent laboratory studies:  Lab Results   Component Value Date   HGB 14.1 06/16/2019   HGB 16.9 (H) 04/21/2019   HGB 14.1 03/07/2019   Lab Results  Component Value Date   WBC 8.8 06/16/2019   PLT 300 06/16/2019   Lab Results  Component Value Date   INR 1.0 04/21/2019   Lab Results  Component Value Date   NA 139 06/16/2019   K 4.4 06/16/2019   CL 102 06/16/2019   CO2 29 06/16/2019   BUN 17 06/16/2019   CREATININE 0.81 06/16/2019   GLUCOSE 87 06/16/2019    Discharge Medications:   Allergies as of 06/25/2019      Reactions   Codeine Sulfate    "made me jumpy"   Erythromycin Nausea Only      Medication List    STOP taking these medications   amLODipine 5 MG tablet Commonly known as: NORVASC   aspirin 81 MG tablet Replaced by: aspirin EC 81 MG tablet   oxyCODONE 5 MG immediate release tablet Commonly known as: Oxy IR/ROXICODONE   Tylenol Arthritis Pain 650 MG CR tablet Generic drug: acetaminophen   venlafaxine XR 37.5 MG 24 hr capsule Commonly known as: EFFEXOR-XR     TAKE these medications   ALPRAZolam 0.25 MG tablet Commonly known as: XANAX TAKE 2 TABLETS BY MOUTH AT BEDTIME AS NEEDED FOR ANXIETY What changed: See the new instructions.   aspirin EC 81 MG tablet  Take 1 tablet (81 mg total) by mouth 2 (two) times daily. For DVT prophylaxis for 30 days after surgery. Replaces: aspirin 81 MG tablet   baclofen 10 MG tablet Commonly known as: LIORESAL Take 1 tablet (10 mg total) by mouth 3 (three) times daily as needed for muscle spasms.   benazepril 20 MG tablet Commonly known as: LOTENSIN TAKE 1 AND 1/2 TABLETS DAILY BY MOUTH What changed:   how much to take  how to take this  when to take this  additional instructions   CENTRUM PO Take 1 tablet by mouth daily.   gabapentin 100 MG capsule Commonly known as: Neurontin Take 1 capsule (100 mg total) by mouth 2 (two) times daily for 14 days. For pain. What changed:   how much to take  when to take this  additional  instructions   HYDROcodone-acetaminophen 5-325 MG tablet Commonly known as: Norco Take 1-2 tablets by mouth every 6 (six) hours as needed for up to 5 days for severe pain (Use tylenol for mild and moderate pain).   levothyroxine 75 MCG tablet Commonly known as: SYNTHROID TAKE 1 TABLET BY MOUTH EVERY DAY What changed: when to take this   metoprolol succinate 50 MG 24 hr tablet Commonly known as: TOPROL-XL Take 1 tablet (50 mg total) by mouth daily. Take with or immediately following a meal. What changed: when to take this   ondansetron 4 MG tablet Commonly known as: Zofran Take 1 tablet (4 mg total) by mouth every 8 (eight) hours as needed for nausea or vomiting.   rosuvastatin 20 MG tablet Commonly known as: CRESTOR Take 1 tablet (20 mg total) by mouth daily.   TART CHERRY ADVANCED PO Take 1 capsule by mouth daily.   Urocit-K 10 10 MEQ (1080 MG) SR tablet Generic drug: potassium citrate Take 10 mEq by mouth daily.       Diagnostic Studies: Dg C-arm 1-60 Min-no Report  Result Date: 06/24/2019 CLINICAL DATA:  Status post left hip arthroplasty. EXAM: OPERATIVE LEFT HIP (WITH PELVIS IF PERFORMED) 1 VIEWS TECHNIQUE: Fluoroscopic spot image(s) were submitted for interpretation post-operatively. COMPARISON:  None FINDINGS: Images obtained in the operator in show changes of a left total hip arthroplasty. The hardware components are in anatomic alignment. No periprosthetic fracture or subluxation identified. IMPRESSION: 1. Status post left total hip arthroplasty. Electronically Signed   By: Kerby Moors M.D.   On: 06/24/2019 09:37   Dg Hip Operative Unilat With Pelvis Left  Result Date: 06/24/2019 CLINICAL DATA:  Status post left hip arthroplasty. EXAM: OPERATIVE LEFT HIP (WITH PELVIS IF PERFORMED) 1 VIEWS TECHNIQUE: Fluoroscopic spot image(s) were submitted for interpretation post-operatively. COMPARISON:  None FINDINGS: Images obtained in the operator in show changes of a left  total hip arthroplasty. The hardware components are in anatomic alignment. No periprosthetic fracture or subluxation identified. IMPRESSION: 1. Status post left total hip arthroplasty. Electronically Signed   By: Kerby Moors M.D.   On: 06/24/2019 09:37    Disposition: Discharge disposition: 01-Home or Self Care       Discharge Instructions    Discharge patient   Complete by: As directed    After A.M. therapy   Discharge disposition: 01-Home or Self Care   Discharge patient date: 06/25/2019      Follow-up [place], [place], MD.   Specialty: Orthopedic Surgery Contact information: [address] [place] [address] [phone]  Signed: Prudencio Burly III PA-C 06/25/2019, 7:08 AM

## 2019-06-25 NOTE — Progress Notes (Signed)
    Subjective: Patient reports pain as mild.  Tolerating diet.  Urinating.  No CP, SOB.  Good early mobilization with therapy.  Objective:   VITALS:   Vitals:   06/24/19 2005 06/24/19 2104 06/25/19 0123 06/25/19 0525  BP:  129/69 127/66 131/63  Pulse: 84 88 77 77  Resp: 16 16 16 16   Temp:  98.8 F (37.1 C) 98.5 F (36.9 C) 98 F (36.7 C)  TempSrc:  Oral Oral Oral  SpO2:  94% 93% 96%  Weight:      Height:       CBC Latest Ref Rng & Units 06/16/2019 04/21/2019 03/07/2019  WBC 4.0 - 10.5 K/uL 8.8 12.2(H) 13.0(H)  Hemoglobin 12.0 - 15.0 g/dL 14.1 16.9(H) 14.1  Hematocrit 36.0 - 46.0 % 43.7 51.3(H) 42.1  Platelets 150 - 400 K/uL 300 313 351.0   BMP Latest Ref Rng & Units 06/16/2019 04/21/2019 03/07/2019  Glucose 70 - 99 mg/dL 87 90 -  BUN 8 - 23 mg/dL 17 23 -  Creatinine 0.44 - 1.00 mg/dL 0.81 0.93 -  Sodium 135 - 145 mmol/L 139 140 -  Potassium 3.5 - 5.1 mmol/L 4.4 4.1 -  Chloride 98 - 111 mmol/L 102 102 -  CO2 22 - 32 mmol/L 29 25 -  Calcium 8.9 - 10.3 mg/dL 9.6 9.7 9.6   Intake/Output      10/27 0701 - 10/28 0700 10/28 0701 - 10/29 0700   P.O. 360    I.V. (mL/kg) 2874.2 (39.4)    IV Piggyback 300    Total Intake(mL/kg) 3534.2 (48.5)    Urine (mL/kg/hr) 2075 (1.2)    Blood 160    Total Output 2235    Net +1299.2            Physical Exam: General: NAD.  Supine in bed.  Calm, conversant. Resp: No increased wob Cardio: regular rate and rhythm ABD soft Neurologically intact MSK LLE: Neurovascularly intact Sensation intact distally Intact pulses distally Dorsiflexion/Plantar flexion intact Incision: dressing C/D/I   Assessment: 1 Day Post-Op  S/P Procedure(s) (LRB): TOTAL HIP ARTHROPLASTY ANTERIOR APPROACH (Left) by Dr. Ernesta Amble. Percell Miller on 06/24/2019  Principal Problem:   Primary osteoarthritis of left hip Active Problems:   HYPERLIPIDEMIA   Essential hypertension   Anxiety and depression   S/P lumbar fusion   Primary localized osteoarthritis of  hip   Primary osteoarthritis, status post total hip arthroplasty Doing well postop day 1 Tolerating diet and voiding Pain controlled Mobilizing well  Plan: Up with therapy Incentive Spirometry Apply ice as needed  Weight Bearing: Weight Bearing as Tolerated (WBAT) LLE Dressings: Maintain Mepilex.   VTE prophylaxis: Aspirin, SCDs, ambulation Dispo: Home today after a.m. therapy   Prudencio Burly III, PA-C 06/25/2019, 7:06 AM

## 2019-06-26 ENCOUNTER — Encounter: Payer: Self-pay | Admitting: Family

## 2019-07-07 ENCOUNTER — Other Ambulatory Visit: Payer: Self-pay | Admitting: Family

## 2019-07-07 DIAGNOSIS — E890 Postprocedural hypothyroidism: Secondary | ICD-10-CM

## 2019-07-07 MED ORDER — LEVOTHYROXINE SODIUM 75 MCG PO TABS: 75.0000 ug | ORAL_TABLET | Freq: Every day | ORAL | 0 refills | Status: DC

## 2019-08-13 ENCOUNTER — Other Ambulatory Visit: Payer: Self-pay | Admitting: Family

## 2019-08-13 DIAGNOSIS — F329 Major depressive disorder, single episode, unspecified: Secondary | ICD-10-CM

## 2019-08-13 DIAGNOSIS — F32A Depression, unspecified: Secondary | ICD-10-CM

## 2019-08-15 ENCOUNTER — Other Ambulatory Visit: Payer: Self-pay | Admitting: Family

## 2019-08-15 DIAGNOSIS — E890 Postprocedural hypothyroidism: Secondary | ICD-10-CM

## 2019-08-28 ENCOUNTER — Other Ambulatory Visit: Payer: Self-pay | Admitting: Family

## 2019-09-10 ENCOUNTER — Other Ambulatory Visit: Payer: Self-pay | Admitting: Family

## 2019-09-10 DIAGNOSIS — E890 Postprocedural hypothyroidism: Secondary | ICD-10-CM

## 2019-09-10 NOTE — Telephone Encounter (Signed)
I know she had multiple surgeries last fall but we have been asking her to come get her TSH re-checked since August. Is there anyway she can come get her labs checked so we can make sure she is on the correct dosage?

## 2019-09-16 ENCOUNTER — Encounter: Payer: Self-pay | Admitting: Family

## 2019-09-16 NOTE — Telephone Encounter (Signed)
My-chart message sent to patient and I called her last week and left a message.

## 2019-09-18 ENCOUNTER — Other Ambulatory Visit: Payer: Self-pay

## 2019-09-18 ENCOUNTER — Other Ambulatory Visit (INDEPENDENT_AMBULATORY_CARE_PROVIDER_SITE_OTHER): Payer: Medicare Other

## 2019-09-18 DIAGNOSIS — E890 Postprocedural hypothyroidism: Secondary | ICD-10-CM

## 2019-09-18 DIAGNOSIS — E782 Mixed hyperlipidemia: Secondary | ICD-10-CM

## 2019-09-18 LAB — LIPID PANEL
Cholesterol: 279 mg/dL — ABNORMAL HIGH (ref 0–200)
HDL: 44 mg/dL (ref >39.00)
Total CHOL/HDL Ratio: 6
Triglycerides: 499 mg/dL — ABNORMAL HIGH (ref 0.0–149.0)

## 2019-09-18 LAB — TSH: TSH: 38.68 u[IU]/mL — ABNORMAL HIGH (ref 0.35–4.50)

## 2019-09-18 LAB — LDL CHOLESTEROL, DIRECT: Direct LDL: 118 mg/dL

## 2019-09-18 NOTE — Telephone Encounter (Signed)
Patient coming in for labs tomorrow.

## 2019-09-19 ENCOUNTER — Encounter: Payer: Self-pay | Admitting: Family

## 2019-09-24 ENCOUNTER — Other Ambulatory Visit: Payer: Self-pay | Admitting: Family

## 2019-09-24 ENCOUNTER — Encounter: Payer: Self-pay | Admitting: Family

## 2019-09-24 DIAGNOSIS — E890 Postprocedural hypothyroidism: Secondary | ICD-10-CM

## 2019-09-24 DIAGNOSIS — E782 Mixed hyperlipidemia: Secondary | ICD-10-CM

## 2019-09-24 MED ORDER — LEVOTHYROXINE SODIUM 100 MCG PO TABS: 100.0000 ug | ORAL_TABLET | Freq: Every day | ORAL | 1 refills | Status: DC

## 2019-09-26 ENCOUNTER — Encounter: Payer: Self-pay | Admitting: Family

## 2019-10-02 ENCOUNTER — Other Ambulatory Visit: Payer: Self-pay | Admitting: Family

## 2019-10-02 ENCOUNTER — Encounter: Payer: Self-pay | Admitting: Family

## 2019-10-02 DIAGNOSIS — F32A Depression, unspecified: Secondary | ICD-10-CM

## 2019-10-02 DIAGNOSIS — F329 Major depressive disorder, single episode, unspecified: Secondary | ICD-10-CM

## 2019-10-03 ENCOUNTER — Other Ambulatory Visit: Payer: Self-pay | Admitting: Family

## 2019-10-03 DIAGNOSIS — E782 Mixed hyperlipidemia: Secondary | ICD-10-CM

## 2019-10-03 MED ORDER — ROSUVASTATIN CALCIUM 20 MG PO TABS: 20.0000 mg | ORAL_TABLET | Freq: Every day | ORAL | 1 refills | Status: DC

## 2019-10-20 DIAGNOSIS — Z96642 Presence of left artificial hip joint: Secondary | ICD-10-CM | POA: Diagnosis not present

## 2019-10-22 ENCOUNTER — Other Ambulatory Visit: Payer: Self-pay | Admitting: Family

## 2019-10-22 MED ORDER — LEVOTHYROXINE SODIUM 100 MCG PO TABS: 100.0000 ug | ORAL_TABLET | Freq: Every day | ORAL | 0 refills | Status: DC

## 2019-10-22 NOTE — Telephone Encounter (Signed)
It is probably time for her to come in for an actual office visit and bring her medications with her. I don't have that she is taking Amlodipine but we got a refill request today. We have that she is taking Benazepril and Metoprolol for her blood pressure. Is she now taking Amlodipine?

## 2019-10-22 NOTE — Telephone Encounter (Signed)
Called and left message for patient to return call to clinic.  Also sent her my-chart message also.

## 2019-10-23 ENCOUNTER — Other Ambulatory Visit: Payer: Self-pay

## 2019-10-23 ENCOUNTER — Other Ambulatory Visit (INDEPENDENT_AMBULATORY_CARE_PROVIDER_SITE_OTHER): Payer: Medicare Other

## 2019-10-23 DIAGNOSIS — E890 Postprocedural hypothyroidism: Secondary | ICD-10-CM

## 2019-10-23 LAB — TSH: TSH: 16.98 u[IU]/mL — ABNORMAL HIGH (ref 0.35–4.50)

## 2019-10-23 NOTE — Telephone Encounter (Signed)
My chart message read by patient. Waiting response from patient.

## 2019-10-23 NOTE — Telephone Encounter (Signed)
Patient returning call and states that she is now taking the amlodipine, along with the Benazepril and Metoprolol. States that she was taken off of the Amlodipine while in the hospital with surgery because her BP was bottoming out with the pain med. States that since she has been out of the hospital, she has started taking the Amlodipine again. States that he BP is running around 138/80. States that she would be okay making a visit to see Mickel Baas, if needed. States that she also has a few weeks left of the Amlodipine.

## 2019-10-24 ENCOUNTER — Other Ambulatory Visit: Payer: Self-pay | Admitting: Family

## 2019-10-24 MED ORDER — LEVOTHYROXINE SODIUM 125 MCG PO TABS: 100.0000 ug | ORAL_TABLET | Freq: Every day | ORAL | 0 refills | Status: DC

## 2019-10-29 ENCOUNTER — Ambulatory Visit (INDEPENDENT_AMBULATORY_CARE_PROVIDER_SITE_OTHER): Payer: Medicare Other | Admitting: Family

## 2019-10-29 ENCOUNTER — Encounter: Payer: Self-pay | Admitting: Family

## 2019-10-29 ENCOUNTER — Other Ambulatory Visit: Payer: Self-pay

## 2019-10-29 VITALS — BP 148/90 | HR 87 | Temp 98.0°F | Ht 65.0 in | Wt 159.6 lb

## 2019-10-29 DIAGNOSIS — I10 Essential (primary) hypertension: Secondary | ICD-10-CM

## 2019-10-29 DIAGNOSIS — M2550 Pain in unspecified joint: Secondary | ICD-10-CM

## 2019-10-29 DIAGNOSIS — E782 Mixed hyperlipidemia: Secondary | ICD-10-CM

## 2019-10-29 DIAGNOSIS — Z1211 Encounter for screening for malignant neoplasm of colon: Secondary | ICD-10-CM

## 2019-10-29 MED ORDER — BENAZEPRIL HCL 40 MG PO TABS: 40.0000 mg | ORAL_TABLET | Freq: Every day | ORAL | 3 refills | Status: DC

## 2019-10-29 MED ORDER — CELECOXIB 200 MG PO CAPS: ORAL_CAPSULE | ORAL | 5 refills | Status: DC

## 2019-10-29 MED ORDER — MELOXICAM 7.5 MG PO TABS: 7.5000 mg | ORAL_TABLET | Freq: Every day | ORAL | 1 refills | Status: DC

## 2019-10-29 MED ORDER — AMLODIPINE BESYLATE 5 MG PO TABS: 5.0000 mg | ORAL_TABLET | Freq: Every day | ORAL | 3 refills | Status: DC

## 2019-10-29 NOTE — Progress Notes (Signed)
[name] is a 74 y.o. female with the following history as recorded in EpicCare:  Patient Active Problem List   Diagnosis Date Noted  . Primary localized osteoarthritis of hip 06/24/2019  . Primary osteoarthritis of left hip 06/02/2019  . S/P lumbar fusion 04/23/2019  . Lumbar spinal stenosis 07/16/2018  . Renal cyst 07/16/2018  . Left lumbar radiculopathy 06/24/2018  . Left hip pain 10/25/2017  . Greater trochanteric bursitis of left hip 10/03/2017  . Anxiety and depression 05/23/2017  . Hyperlipidemia 04/21/2016  . Routine general medical examination at a health care facility 01/05/2016  . Medicare annual wellness visit, subsequent 01/05/2016  . Allergic rhinitis 12/29/2015  . Vaginal atrophy 10/22/2013  . DJD (degenerative joint disease) of knee 10/18/2012  . NEPHRECTOMY, HX OF 05/28/2009  . Hypothyroidism, postsurgical 07/18/2007  . HYPERLIPIDEMIA 07/18/2007  . Essential hypertension 07/18/2007  . OSTEOPENIA 07/15/2007  . History of colonic polyps 07/15/2007    Current Outpatient Medications  Medication Sig Dispense Refill  . ALPRAZolam (XANAX) 0.25 MG tablet TAKE 2 TABLETS BY MOUTH AT BEDTIME AS NEEDED FOR ANXIETY 60 tablet 1  . amLODipine (NORVASC) 5 MG tablet Take 1 tablet (5 mg total) by mouth daily. 90 tablet 3  . aspirin EC 81 MG tablet Take 1 tablet (81 mg total) by mouth 2 (two) times daily. For DVT prophylaxis for 30 days after surgery. 60 tablet 0  . levothyroxine (SYNTHROID) 125 MCG tablet Take 1 tablet (125 mcg total) by mouth daily. 90 tablet 0  . metoprolol succinate (TOPROL-XL) 50 MG 24 hr tablet Take 1 tablet (50 mg total) by mouth daily. Take with or immediately following a meal. (Patient taking differently: Take 50 mg by mouth at bedtime. Take with or immediately following a meal.) 90 tablet 3  . Misc Natural Products (TART CHERRY ADVANCED PO) Take 1 capsule by mouth daily.     . Multiple Vitamins-Minerals (CENTRUM PO) Take 1 tablet by mouth daily.     .  potassium citrate (UROCIT-K 10) 10 MEQ (1080 MG) SR tablet Take 10 mEq by mouth daily.     . rosuvastatin (CRESTOR) 20 MG tablet Take 1 tablet (20 mg total) by mouth daily. 90 tablet 1  . benazepril (LOTENSIN) 40 MG tablet Take 1 tablet (40 mg total) by mouth daily. 90 tablet 3  . celecoxib (CELEBREX) 200 MG capsule 1 qd prn only 30 capsule 5  . gabapentin (NEURONTIN) 100 MG capsule Take 1 capsule (100 mg total) by mouth 2 (two) times daily for 14 days. For pain. (Patient taking differently: Take 100 mg by mouth 2 (two) times daily. For pain.) 28 capsule 0   No current facility-administered medications for this visit.    Allergies: Codeine sulfate and Erythromycin  Past Medical History:  Diagnosis Date  . Adenomatous colon polyp   . Arthritis   . Cataract   . Complication of anesthesia   . Heart murmur    MVP  . Hyperlipidemia   . Hypertension   . Hypothyroidism   . PONV (postoperative nausea and vomiting)   . Thyroid disease     Past Surgical History:  Procedure Laterality Date  . BREAST EXCISIONAL BIOPSY Right 1983  . COLONOSCOPY W/ POLYPECTOMY  2003,2006,2013   [name]  . DIAGNOSTIC LAPAROSCOPY  1979   exploratory to get pregnant  . DILATION AND CURETTAGE OF UTERUS     X2  . NEPHRECTOMY  2010   Left for 9.8cm benign complex cyst  . THYROIDECTOMY  2000  [name]  . TONSILLECTOMY    . TOTAL ABDOMINAL HYSTERECTOMY W/ BILATERAL SALPINGOOPHORECTOMY  2003   [name] for dysfunctional menses  . TOTAL HIP ARTHROPLASTY Left 06/24/2019   Procedure: TOTAL HIP ARTHROPLASTY ANTERIOR APPROACH;  Surgeon: Renette Butters, MD;  Location: [facility];  Service: Orthopedics;  Laterality: Left;    Family History  Problem Relation Age of Onset  . Transient ischemic attack Father        in 78s  . Coronary artery disease Father        stents @ 56  . Nephrolithiasis Father   . Hyperlipidemia Mother   . Transient ischemic attack Mother 14  . Thyroid disease Mother        hyperthyroid  .  Urolithiasis Sister   . Diabetes Neg Hx     Social History   Tobacco Use  . Smoking status: Former Smoker    Packs/day: 0.50    Years: 4.00    Pack years: 2.00    Quit date: 08/29/1975    Years since quitting: 44.1  . Smokeless tobacco: Never Used  . Tobacco comment: smoked 1973-1977, up to 1/2 ppd  Substance Use Topics  . Alcohol use: Yes    Alcohol/week: 0.0 standard drinks    Comment: minimally  1 every 6 months    Subjective:  Patient presents for follow-up on hypertension; currently taking Amlodipine, Toprol XL and Lotensin;  Having increased problems with arthritis- has done well on Celebrex and requesting refill today; only taking Gabapentin at night- cannot tolerate during day; Does feel better with recent adjustment to Synthroid; Overdue for colonoscopy; patient also plans to schedule for her mammogram.    Objective:  Vitals:   10/29/19 1139  BP: (!) 148/90  Pulse: 87  Temp: 98 F (36.7 C)  TempSrc: Oral  SpO2: 97%  Weight: 159 lb 9.6 oz (72.4 kg)  Height: 5\' 5"  (1.651 m)    General: Well developed, well nourished, in no acute distress  Skin : Warm and dry.  Head: Normocephalic and atraumatic  Lungs: Respirations unlabored; clear to auscultation bilaterally without wheeze, rales, rhonchi  CVS exam: normal rate and regular rhythm.  Neurologic: Alert and oriented; speech intact; face symmetrical; moves all extremities well; CNII-XII intact without focal deficit   Assessment:  1. Essential hypertension   2. Encounter for screening colonoscopy   3. HYPERLIPIDEMIA   4. Polyarthralgia     Plan:  1. Uncontrolled; increase Lotensin to 40 mg daily; continue Amlodipine and Toprol XL; follow-up in 1 month, sooner prn. 2. Referral updated; 3. Keep planned appointment with lipid clinic for next week; 4. Increase Gabapentin to 200 mg qhs; trial of Celebrex 200 mg 3 x per week;  This visit occurred during the SARS-CoV-2 public health emergency.  Safety protocols were  in place, including screening questions prior to the visit, additional usage of staff PPE, and extensive cleaning of exam room while observing appropriate contact time as indicated for disinfecting solutions.     No follow-ups on file.  Orders Placed This Encounter  Procedures  . Ambulatory referral to Gastroenterology    Referral Priority:   Routine    Referral Type:   Consultation    Referral Reason:   Specialty Services Required    Referred to Provider:   Ladene Artist, MD    Number of Visits Requested:   1    Requested Prescriptions   Signed Prescriptions Disp Refills  . benazepril (LOTENSIN) 40 MG tablet 90 tablet 3  Sig: Take 1 tablet (40 mg total) by mouth daily.  . celecoxib (CELEBREX) 200 MG capsule 30 capsule 5    Sig: 1 qd prn only  . amLODipine (NORVASC) 5 MG tablet 90 tablet 3    Sig: Take 1 tablet (5 mg total) by mouth daily.

## 2019-10-31 ENCOUNTER — Other Ambulatory Visit: Payer: Self-pay | Admitting: Family

## 2019-10-31 DIAGNOSIS — E890 Postprocedural hypothyroidism: Secondary | ICD-10-CM

## 2019-11-07 ENCOUNTER — Encounter: Payer: Self-pay | Admitting: Internal Medicine

## 2019-11-07 ENCOUNTER — Telehealth: Payer: Self-pay | Admitting: Internal Medicine

## 2019-11-07 ENCOUNTER — Other Ambulatory Visit: Payer: Self-pay

## 2019-11-07 ENCOUNTER — Ambulatory Visit (INDEPENDENT_AMBULATORY_CARE_PROVIDER_SITE_OTHER): Payer: Medicare Other | Admitting: Internal Medicine

## 2019-11-07 VITALS — BP 152/82 | HR 82 | Temp 97.3°F | Ht 65.0 in | Wt 160.0 lb

## 2019-11-07 DIAGNOSIS — E782 Mixed hyperlipidemia: Secondary | ICD-10-CM | POA: Diagnosis not present

## 2019-11-07 NOTE — Telephone Encounter (Signed)
Spoke with patient regarding appointment for Calcium scoring ordered by Dr. Gaspar Garbe 11/20/19 at 1:00 pm---arrival time 12:45pm at [facility] [address], [address].  Information is also in My Chart.  Patient voiced her understanding.

## 2019-11-07 NOTE — Patient Instructions (Signed)
Medication Instructions:  NO CHANGES *If you need a refill on your cardiac medications before your next appointment, please call your pharmacy*   Lab Work: FASTING lab work in 3-4 months to check cholesterol  - please complete at least 3 days prior to your scheduled appointment  - you can go to your PCP office or use the [facility] in Dr. Lysbeth Penner office that is open M-F from 8am-4:30pm (no appointment needed)  If you have labs (blood work) drawn today and your tests are completely normal, you will receive your results only by: [name] MyChart Message (if you have MyChart) OR . A paper copy in the mail If you have any lab test that is abnormal or we need to change your treatment, we will call you to review the results.   Testing/Procedures: Dr. Debara Pickett has ordered a CT coronary calcium score. This test is done at [address]. This is $150 out of pocket.   Coronary CalciumScan A coronary calcium scan is an imaging test used to look for deposits of calcium and other fatty materials (plaques) in the inner lining of the blood vessels of the heart (coronary arteries). These deposits of calcium and plaques can partly clog and narrow the coronary arteries without producing any symptoms or warning signs. This puts a person at risk for a heart attack. This test can detect these deposits before symptoms develop. Tell a health care provider about:  Any allergies you have.  All medicines you are taking, including vitamins, herbs, eye drops, creams, and over-the-counter medicines.  Any problems you or family members have had with anesthetic medicines.  Any blood disorders you have.  Any surgeries you have had.  Any medical conditions you have.  Whether you are pregnant or may be pregnant. What are the risks? Generally, this is a safe procedure. However, problems may occur, including:  Harm to a pregnant woman and her unborn baby. This test involves the use of radiation. Radiation  exposure can be dangerous to a pregnant woman and her unborn baby. If you are pregnant, you generally should not have this procedure done.  Slight increase in the risk of cancer. This is because of the radiation involved in the test. What happens before the procedure? No preparation is needed for this procedure. What happens during the procedure?  You will undress and remove any jewelry around your neck or chest.  You will put on a hospital gown.  Sticky electrodes will be placed on your chest. The electrodes will be connected to an electrocardiogram (ECG) machine to record a tracing of the electrical activity of your heart.  A CT scanner will take pictures of your heart. During this time, you will be asked to lie still and hold your breath for 2-3 seconds while a picture of your heart is being taken. The procedure may vary among health care providers and hospitals. What happens after the procedure?  You can get dressed.  You can return to your normal activities.  It is up to you to get the results of your test. Ask your health care provider, or the department that is doing the test, when your results will be ready. Summary  A coronary calcium scan is an imaging test used to look for deposits of calcium and other fatty materials (plaques) in the inner lining of the blood vessels of the heart (coronary arteries).  Generally, this is a safe procedure. Tell your health care provider if you are pregnant or may be  pregnant.  No preparation is needed for this procedure.  A CT scanner will take pictures of your heart.  You can return to your normal activities after the scan is done. This information is not intended to replace advice given to you by your health care provider. Make sure you discuss any questions you have with your health care provider. Document Released: 02/10/2008 Document Revised: 07/03/2016 Document Reviewed: 07/03/2016 Elsevier Interactive Patient Education  2017  [place]: At [facility], you and your health needs are our priority.  As part of our continuing mission to provide you with exceptional heart care, we have created designated Provider Care Teams.  These Care Teams include your primary Cardiologist (physician) and Advanced Practice Providers (APPs -  Physician Assistants and Nurse Practitioners) who all work together to provide you with the care you need, when you need it.  We recommend signing up for the patient portal called "MyChart".  Sign up information is provided on this After Visit Summary.  MyChart is used to connect with patients for Virtual Visits (Telemedicine).  Patients are able to view lab/test results, encounter notes, upcoming appointments, etc.  Non-urgent messages can be sent to your provider as well.   To learn more about what you can do with MyChart, go to [email or web address].    Your next appointment:   3-4 month(s) - lipid clinic  The format for your next appointment:   Either In Person or Virtual  Provider:   K. Mali Hilty, MD   Other Instructions

## 2019-11-08 ENCOUNTER — Encounter: Payer: Self-pay | Admitting: Internal Medicine

## 2019-11-08 NOTE — Progress Notes (Signed)
LIPID CLINIC CONSULT NOTE  Chief Complaint:  Manage dyslipidemia  Primary Care Physician: Marrian Salvage, [place]  Primary Cardiologist:  Pixie Casino, MD  HPI:  [name] is a 74 y.o. female who is being seen today for the evaluation of dyslipidemia at the request of Marrian Salvage,*.  This is a pleasant 74 year old female kindly referred for evaluation management of dyslipidemia.  She notes a longstanding history of high cholesterol most recently her lipid showed total cholesterol 279, triglycerides 499, HDL 44 and direct LDL 118.  Currently she is tolerating rosuvastatin 20 mg daily.  Her other medical problems include hypertension, hypothyroidism and heart murmur with possible mitral valve prolapse.   PMHx:  Past Medical History:  Diagnosis Date  . Adenomatous colon polyp   . Arthritis   . Cataract   . Complication of anesthesia   . Heart murmur    MVP  . Hyperlipidemia   . Hypertension   . Hypothyroidism   . PONV (postoperative nausea and vomiting)   . Thyroid disease     Past Surgical History:  Procedure Laterality Date  . BREAST EXCISIONAL BIOPSY Right 1983  . COLONOSCOPY W/ POLYPECTOMY  2003,2006,2013   [name]  . DIAGNOSTIC LAPAROSCOPY  1979   exploratory to get pregnant  . DILATION AND CURETTAGE OF UTERUS     X2  . NEPHRECTOMY  2010   Left for 9.8cm benign complex cyst  . THYROIDECTOMY  2000   [name]  . TONSILLECTOMY    . TOTAL ABDOMINAL HYSTERECTOMY W/ BILATERAL SALPINGOOPHORECTOMY  2003   [name] for dysfunctional menses  . TOTAL HIP ARTHROPLASTY Left 06/24/2019   Procedure: TOTAL HIP ARTHROPLASTY ANTERIOR APPROACH;  Surgeon: Renette Butters, MD;  Location: [facility];  Service: Orthopedics;  Laterality: Left;    FAMHx:  Family History  Problem Relation Age of Onset  . Transient ischemic attack Father        in 57s  . Coronary artery disease Father        stents @ 25  . Nephrolithiasis Father   . Hyperlipidemia Mother     . Transient ischemic attack Mother 37  . Thyroid disease Mother        hyperthyroid  . Urolithiasis Sister   . Diabetes Neg Hx     SOCHx:   reports that she quit smoking about 44 years ago. She has a 2.00 pack-year smoking history. She has never used smokeless tobacco. She reports current alcohol use. She reports that she does not use drugs.  ALLERGIES:  Allergies  Allergen Reactions  . Codeine Sulfate     "made me jumpy"  . Erythromycin Nausea Only    ROS: Pertinent items noted in HPI and remainder of comprehensive ROS otherwise negative.  HOME MEDS: Current Outpatient Medications on File Prior to Visit  Medication Sig Dispense Refill  . ALPRAZolam (XANAX) 0.25 MG tablet TAKE 2 TABLETS BY MOUTH AT BEDTIME AS NEEDED FOR ANXIETY 60 tablet 1  . amLODipine (NORVASC) 5 MG tablet Take 1 tablet (5 mg total) by mouth daily. 90 tablet 3  . aspirin EC 81 MG tablet Take 1 tablet (81 mg total) by mouth 2 (two) times daily. For DVT prophylaxis for 30 days after surgery. 60 tablet 0  . benazepril (LOTENSIN) 40 MG tablet Take 1 tablet (40 mg total) by mouth daily. 90 tablet 3  . celecoxib (CELEBREX) 200 MG capsule 1 qd prn only 30 capsule 5  . levothyroxine (SYNTHROID) 125 MCG  tablet Take 1 tablet (125 mcg total) by mouth daily. 90 tablet 0  . metoprolol succinate (TOPROL-XL) 50 MG 24 hr tablet Take 1 tablet (50 mg total) by mouth daily. Take with or immediately following a meal. (Patient taking differently: Take 50 mg by mouth at bedtime. Take with or immediately following a meal.) 90 tablet 3  . Misc Natural Products (TART CHERRY ADVANCED PO) Take 1 capsule by mouth daily.     . Multiple Vitamins-Minerals (CENTRUM PO) Take 1 tablet by mouth daily.     . potassium citrate (UROCIT-K 10) 10 MEQ (1080 MG) SR tablet Take 10 mEq by mouth daily.     . rosuvastatin (CRESTOR) 20 MG tablet Take 1 tablet (20 mg total) by mouth daily. 90 tablet 1  . gabapentin (NEURONTIN) 100 MG capsule Take 1 capsule  (100 mg total) by mouth 2 (two) times daily for 14 days. For pain. (Patient taking differently: Take 100 mg by mouth 2 (two) times daily. For pain.) 28 capsule 0  . [DISCONTINUED] simvastatin (ZOCOR) 20 MG tablet Take 20 mg by mouth at bedtime.       No current facility-administered medications on file prior to visit.    LABS/IMAGING: No results found for this or any previous visit (from the past 48 hour(s)). No results found.  LIPID PANEL:    Component Value Date/Time   CHOL 279 (H) 09/18/2019 0943   TRIG (H) 09/18/2019 0943    499.0 Triglyceride is over 400; calculations on Lipids are invalid.   TRIG 152 (H) 07/13/2006 1126   HDL 44.00 09/18/2019 0943   CHOLHDL 6 09/18/2019 0943   VLDL 56.8 (H) 03/07/2019 1003   LDLCALC 90 07/11/2007 1321   LDLDIRECT 118.0 09/18/2019 0943    WEIGHTS: Wt Readings from Last 3 Encounters:  11/07/19 160 lb (72.6 kg)  10/29/19 159 lb 9.6 oz (72.4 kg)  06/24/19 160 lb 11.5 oz (72.9 kg)    VITALS: BP (!) 152/82   Pulse 82   Temp (!) 97.3 F (36.3 C)   Ht 5\' 5"  (1.651 m)   Wt 160 lb (72.6 kg)   SpO2 96%   BMI 26.63 kg/m   EXAM: General appearance: alert and no distress Neck: no carotid bruit, no JVD and thyroid not enlarged, symmetric, no tenderness/mass/nodules Lungs: clear to auscultation bilaterally Heart: regular rate and rhythm, S1, S2 normal, no murmur, click, rub or gallop Abdomen: soft, non-tender; bowel sounds normal; no masses,  no organomegaly Extremities: extremities normal, atraumatic, no cyanosis or edema Pulses: 2+ and symmetric Skin: Skin color, texture, turgor normal. No rashes or lesions Neurologic: Mental status: Alert, oriented, thought content appropriate Psych: Pleasant  EKG: Deferred  ASSESSMENT: 1. Mixed dyslipidemia with high triglycerides  PLAN: 1.   There are certainly other options for treatment including possibly adding Vascepa or other agents, even PCSK9 inhibitor depending on what her target LDL  is.  I think would be helpful for her to have coronary calcium scoring to determine if there is any documented coronary artery disease.  We will plan follow-up afterwards.  Thanks for the kind referral.  Pixie Casino, MD, FACC, [place] Director of the [facility]  [facility] Diplomate of the American Board of Clinical Lipidology Attending Cardiologist  Direct Dial: [phone]  Fax: [phone]  Website:  [email or web address] Jaziel Bennett 11/08/2019, 9:07 PM

## 2019-11-19 ENCOUNTER — Other Ambulatory Visit: Payer: Self-pay | Admitting: Family

## 2019-11-20 ENCOUNTER — Other Ambulatory Visit: Payer: Self-pay

## 2019-11-20 ENCOUNTER — Ambulatory Visit (INDEPENDENT_AMBULATORY_CARE_PROVIDER_SITE_OTHER)
Admission: RE | Admit: 2019-11-20 | Discharge: 2019-11-20 | Disposition: A | Discharge status: Home / Self Care | Payer: Self-pay | Source: Ambulatory Visit | Attending: Internal Medicine | Admitting: Internal Medicine

## 2019-11-20 DIAGNOSIS — E782 Mixed hyperlipidemia: Secondary | ICD-10-CM

## 2019-11-21 ENCOUNTER — Telehealth: Payer: Self-pay | Admitting: Internal Medicine

## 2019-11-21 NOTE — Telephone Encounter (Signed)
Spoke with patient about Repatha. Explained that medication will need to be pre-approved and that the co-pay is around $25-50/month. Advised that she apply for [employer] [place] [place] [employer] message sent with info). Advised she will take medication for 3-4 months, check labs, and then f/u with MD in June/July. She voiced understanding.

## 2019-11-21 NOTE — Telephone Encounter (Signed)
   Pt c/o medication issue:  1. Name of Medication: Repatha  2. How are you currently taking this medication (dosage and times per day)?   3. Are you having a reaction (difficulty breathing--STAT)?   4. What is your medication issue? Pt received mychart message from [place] regarding CT result, it also stated she needs to start taking repatha, she said that is too expensive and can't afford $500 a month. She requesting for Eliezer Lofts to call her.

## 2019-11-25 ENCOUNTER — Telehealth: Payer: Self-pay | Admitting: Internal Medicine

## 2019-11-25 NOTE — Telephone Encounter (Signed)
Follow up   Patient would like a call to discuss the repatha. Please call.

## 2019-11-25 NOTE — Telephone Encounter (Signed)
Request Reference Number: [id number]. REPATHA SURE INJ 140MG /ML is approved through 05/27/2020. Your patient may now fill this prescription and it will be covered.  Patient notified via [employer]

## 2019-11-25 NOTE — Telephone Encounter (Signed)
LMTCB

## 2019-11-25 NOTE — Telephone Encounter (Signed)
Patient states she would like to talk to PCP about medication at next appointment before deciding if she wants to take this. She is worried about cost. Advised her to apply for [employer] grant, which she plans to do if she decides to take the medication

## 2019-11-25 NOTE — Telephone Encounter (Signed)
PA for repatha submitted via [employer] (Key: [id number])

## 2019-11-25 NOTE — Telephone Encounter (Signed)
   Pt returning call, transferred call

## 2019-12-02 ENCOUNTER — Ambulatory Visit (INDEPENDENT_AMBULATORY_CARE_PROVIDER_SITE_OTHER): Payer: Medicare Other | Admitting: Family

## 2019-12-02 ENCOUNTER — Ambulatory Visit (INDEPENDENT_AMBULATORY_CARE_PROVIDER_SITE_OTHER): Payer: Medicare Other

## 2019-12-02 ENCOUNTER — Encounter: Payer: Self-pay | Admitting: Family

## 2019-12-02 ENCOUNTER — Other Ambulatory Visit: Payer: Self-pay

## 2019-12-02 VITALS — BP 140/84 | HR 83 | Temp 98.2°F | Ht 65.0 in | Wt 158.8 lb

## 2019-12-02 DIAGNOSIS — E890 Postprocedural hypothyroidism: Secondary | ICD-10-CM

## 2019-12-02 DIAGNOSIS — I10 Essential (primary) hypertension: Secondary | ICD-10-CM

## 2019-12-02 DIAGNOSIS — E785 Hyperlipidemia, unspecified: Secondary | ICD-10-CM

## 2019-12-02 DIAGNOSIS — M7732 Calcaneal spur, left foot: Secondary | ICD-10-CM | POA: Diagnosis not present

## 2019-12-02 DIAGNOSIS — M19072 Primary osteoarthritis, left ankle and foot: Secondary | ICD-10-CM | POA: Diagnosis not present

## 2019-12-02 DIAGNOSIS — M79672 Pain in left foot: Secondary | ICD-10-CM

## 2019-12-02 LAB — TSH: TSH: 0.03 u[IU]/mL — ABNORMAL LOW (ref 0.35–4.50)

## 2019-12-02 MED ORDER — GABAPENTIN 100 MG PO CAPS: 100.0000 mg | ORAL_CAPSULE | Freq: Two times a day (BID) | ORAL | 3 refills | Status: DC

## 2019-12-02 NOTE — Progress Notes (Signed)
[name] is a 74 y.o. female with the following history as recorded in EpicCare:  Patient Active Problem List   Diagnosis Date Noted  . Primary localized osteoarthritis of hip 06/24/2019  . Primary osteoarthritis of left hip 06/02/2019  . S/P lumbar fusion 04/23/2019  . Lumbar spinal stenosis 07/16/2018  . Renal cyst 07/16/2018  . Left lumbar radiculopathy 06/24/2018  . Left hip pain 10/25/2017  . Greater trochanteric bursitis of left hip 10/03/2017  . Anxiety and depression 05/23/2017  . Hyperlipidemia 04/21/2016  . Routine general medical examination at a health care facility 01/05/2016  . Medicare annual wellness visit, subsequent 01/05/2016  . Allergic rhinitis 12/29/2015  . Vaginal atrophy 10/22/2013  . DJD (degenerative joint disease) of knee 10/18/2012  . NEPHRECTOMY, HX OF 05/28/2009  . Hypothyroidism, postsurgical 07/18/2007  . HYPERLIPIDEMIA 07/18/2007  . Essential hypertension 07/18/2007  . OSTEOPENIA 07/15/2007  . History of colonic polyps 07/15/2007    Current Outpatient Medications  Medication Sig Dispense Refill  . ALPRAZolam (XANAX) 0.25 MG tablet TAKE 2 TABLETS BY MOUTH AT BEDTIME AS NEEDED FOR ANXIETY 60 tablet 1  . amLODipine (NORVASC) 5 MG tablet Take 1 tablet (5 mg total) by mouth daily. 90 tablet 3  . aspirin EC 81 MG tablet Take 1 tablet (81 mg total) by mouth 2 (two) times daily. For DVT prophylaxis for 30 days after surgery. 60 tablet 0  . benazepril (LOTENSIN) 40 MG tablet Take 1 tablet (40 mg total) by mouth daily. 90 tablet 3  . celecoxib (CELEBREX) 200 MG capsule 1 qd prn only 30 capsule 5  . levothyroxine (SYNTHROID) 125 MCG tablet Take 1 tablet (125 mcg total) by mouth daily. 90 tablet 0  . metoprolol succinate (TOPROL-XL) 50 MG 24 hr tablet Take 1 tablet (50 mg total) by mouth daily. Take with or immediately following a meal. (Patient taking differently: Take 50 mg by mouth at bedtime. Take with or immediately following a meal.) 90 tablet 3  .  Misc Natural Products (TART CHERRY ADVANCED PO) Take 1 capsule by mouth daily.     . Multiple Vitamins-Minerals (CENTRUM PO) Take 1 tablet by mouth daily.     . potassium citrate (UROCIT-K 10) 10 MEQ (1080 MG) SR tablet Take 10 mEq by mouth daily.     . rosuvastatin (CRESTOR) 20 MG tablet Take 1 tablet (20 mg total) by mouth daily. 90 tablet 1  . gabapentin (NEURONTIN) 100 MG capsule Take 1 capsule (100 mg total) by mouth 2 (two) times daily. For pain. 180 capsule 3   No current facility-administered medications for this visit.    Allergies: Codeine sulfate and Erythromycin  Past Medical History:  Diagnosis Date  . Adenomatous colon polyp   . Arthritis   . Cataract   . Complication of anesthesia   . Heart murmur    MVP  . Hyperlipidemia   . Hypertension   . Hypothyroidism   . PONV (postoperative nausea and vomiting)   . Thyroid disease     Past Surgical History:  Procedure Laterality Date  . BREAST EXCISIONAL BIOPSY Right 1983  . COLONOSCOPY W/ POLYPECTOMY  2003,2006,2013   [name]  . DIAGNOSTIC LAPAROSCOPY  1979   exploratory to get pregnant  . DILATION AND CURETTAGE OF UTERUS     X2  . NEPHRECTOMY  2010   Left for 9.8cm benign complex cyst  . THYROIDECTOMY  2000   [name]  . TONSILLECTOMY    . TOTAL ABDOMINAL HYSTERECTOMY W/ BILATERAL SALPINGOOPHORECTOMY  2003   [name] for dysfunctional menses  . TOTAL HIP ARTHROPLASTY Left 06/24/2019   Procedure: TOTAL HIP ARTHROPLASTY ANTERIOR APPROACH;  Surgeon: Renette Butters, MD;  Location: [facility];  Service: Orthopedics;  Laterality: Left;    Family History  Problem Relation Age of Onset  . Transient ischemic attack Father        in 12s  . Coronary artery disease Father        stents @ 47  . Nephrolithiasis Father   . Hyperlipidemia Mother   . Transient ischemic attack Mother 52  . Thyroid disease Mother        hyperthyroid  . Urolithiasis Sister   . Diabetes Neg Hx     Social History   Tobacco Use  . Smoking  status: Former Smoker    Packs/day: 0.50    Years: 4.00    Pack years: 2.00    Quit date: 08/29/1975    Years since quitting: 44.2  . Smokeless tobacco: Never Used  . Tobacco comment: smoked 1973-1977, up to 1/2 ppd  Substance Use Topics  . Alcohol use: Yes    Alcohol/week: 0.0 standard drinks    Comment: minimally  1 every 6 months    Subjective:  1 month follow-up on hypertension; has been feeling much better with recent medication changes; Denies any chest pain, shortness of breath, blurred vision or headache Needs to get her TSH re-checked today; has been taking medication daily; Also needs refills on Gabapentin;   Objective:  Vitals:   12/02/19 1134  BP: 140/84  Pulse: 83  Temp: 98.2 F (36.8 C)  TempSrc: Oral  SpO2: 99%  Weight: 158 lb 12.8 oz (72 kg)  Height: 5\' 5"  (1.651 m)    General: Well developed, well nourished, in no acute distress  Skin : Warm and dry.  Head: Normocephalic and atraumatic  Lungs: Respirations unlabored; clear to auscultation bilaterally without wheeze, rales, rhonchi  CVS exam: normal rate and regular rhythm.  Extremities: No edema, cyanosis, clubbing  Vessels: Symmetric bilaterally  Neurologic: Alert and oriented; speech intact; face symmetrical; moves all extremities well; CNII-XII intact without focal deficit   Assessment:  1. Hypothyroidism, postsurgical   2. Pain of left heel   3. Essential hypertension   4. Hyperlipidemia, unspecified hyperlipidemia type     Plan:  1. Update lab today; will adjust dosage as needed based on labs; 2. Update X ray today; suspect area of concern is bony prominence; may need podiatry referral; 3. Stable- runs a little high in the office; will not change medications today; follow-up in 6 months; 4. Discussed Repatha and her questions- do think she can benefit from the medication as long as it is not cost prohibitive.  This visit occurred during the SARS-CoV-2 public health emergency.  Safety protocols  were in place, including screening questions prior to the visit, additional usage of staff PPE, and extensive cleaning of exam room while observing appropriate contact time as indicated for disinfecting solutions.      Return in about 6 months (around 06/02/2020).  Orders Placed This Encounter  Procedures  . DG Foot Complete Left    Standing Status:   Future    Number of Occurrences:   1    Standing Expiration Date:   01/31/2021    Order Specific Question:   Reason for Exam (SYMPTOM  OR DIAGNOSIS REQUIRED)    Answer:   left heel bony prominence    Order Specific Question:   Preferred imaging location?  Answer:   [facility]    Order Specific Question:   Radiology Contrast Protocol - do NOT remove file path    Answer:   [email or web address]  . TSH    Requested Prescriptions   Signed Prescriptions Disp Refills  . gabapentin (NEURONTIN) 100 MG capsule 180 capsule 3    Sig: Take 1 capsule (100 mg total) by mouth 2 (two) times daily. For pain.

## 2019-12-03 ENCOUNTER — Encounter: Payer: Self-pay | Admitting: Family

## 2019-12-03 ENCOUNTER — Other Ambulatory Visit: Payer: Self-pay | Admitting: Family

## 2019-12-03 DIAGNOSIS — E039 Hypothyroidism, unspecified: Secondary | ICD-10-CM

## 2019-12-03 MED ORDER — LEVOTHYROXINE SODIUM 112 MCG PO TABS: 112.0000 ug | ORAL_TABLET | Freq: Every day | ORAL | 0 refills | Status: DC

## 2019-12-07 ENCOUNTER — Other Ambulatory Visit: Payer: Self-pay | Admitting: Family

## 2019-12-07 DIAGNOSIS — F419 Anxiety disorder, unspecified: Secondary | ICD-10-CM

## 2019-12-07 DIAGNOSIS — F329 Major depressive disorder, single episode, unspecified: Secondary | ICD-10-CM

## 2019-12-18 ENCOUNTER — Encounter: Payer: Self-pay | Admitting: Family

## 2019-12-19 MED ORDER — EZETIMIBE 10 MG PO TABS: 10.0000 mg | ORAL_TABLET | Freq: Every day | ORAL | 3 refills | Status: DC

## 2019-12-22 DIAGNOSIS — M7662 Achilles tendinitis, left leg: Secondary | ICD-10-CM | POA: Diagnosis not present

## 2019-12-24 ENCOUNTER — Encounter: Payer: Self-pay | Admitting: Family

## 2019-12-31 DIAGNOSIS — H2511 Age-related nuclear cataract, right eye: Secondary | ICD-10-CM | POA: Diagnosis not present

## 2019-12-31 DIAGNOSIS — Z01818 Encounter for other preprocedural examination: Secondary | ICD-10-CM | POA: Diagnosis not present

## 2020-01-01 ENCOUNTER — Other Ambulatory Visit: Payer: Self-pay | Admitting: Family

## 2020-01-01 DIAGNOSIS — Z1231 Encounter for screening mammogram for malignant neoplasm of breast: Secondary | ICD-10-CM

## 2020-01-02 ENCOUNTER — Ambulatory Visit
Admission: RE | Admit: 2020-01-02 | Discharge: 2020-01-02 | Disposition: A | Discharge status: Home / Self Care | Payer: Medicare Other | Source: Ambulatory Visit | Attending: Family | Admitting: Family

## 2020-01-02 DIAGNOSIS — Z1231 Encounter for screening mammogram for malignant neoplasm of breast: Secondary | ICD-10-CM | POA: Diagnosis not present

## 2020-01-02 LAB — HM MAMMOGRAPHY

## 2020-01-13 ENCOUNTER — Other Ambulatory Visit: Payer: Self-pay

## 2020-01-13 ENCOUNTER — Other Ambulatory Visit (INDEPENDENT_AMBULATORY_CARE_PROVIDER_SITE_OTHER): Payer: Medicare Other

## 2020-01-13 DIAGNOSIS — E039 Hypothyroidism, unspecified: Secondary | ICD-10-CM

## 2020-01-13 LAB — TSH: TSH: 0.03 u[IU]/mL — ABNORMAL LOW (ref 0.35–4.50)

## 2020-01-14 ENCOUNTER — Encounter: Payer: Self-pay | Admitting: Family

## 2020-01-14 ENCOUNTER — Other Ambulatory Visit: Payer: Self-pay

## 2020-01-14 ENCOUNTER — Other Ambulatory Visit: Payer: Self-pay | Admitting: Family

## 2020-01-14 DIAGNOSIS — E0590 Thyrotoxicosis, unspecified without thyrotoxic crisis or storm: Secondary | ICD-10-CM

## 2020-01-14 MED ORDER — LEVOTHYROXINE SODIUM 100 MCG PO TABS: 100.0000 ug | ORAL_TABLET | Freq: Every day | ORAL | 0 refills | Status: DC

## 2020-01-15 ENCOUNTER — Encounter: Payer: Self-pay | Admitting: Obstetrics and Gynecology

## 2020-01-15 ENCOUNTER — Ambulatory Visit (INDEPENDENT_AMBULATORY_CARE_PROVIDER_SITE_OTHER): Payer: Medicare Other | Admitting: Obstetrics and Gynecology

## 2020-01-15 VITALS — BP 130/84 | Ht 66.0 in | Wt 161.0 lb

## 2020-01-15 DIAGNOSIS — Z01419 Encounter for gynecological examination (general) (routine) without abnormal findings: Secondary | ICD-10-CM | POA: Diagnosis not present

## 2020-01-15 DIAGNOSIS — L989 Disorder of the skin and subcutaneous tissue, unspecified: Secondary | ICD-10-CM

## 2020-01-15 DIAGNOSIS — Z90711 Acquired absence of uterus with remaining cervical stump: Secondary | ICD-10-CM

## 2020-01-15 DIAGNOSIS — N898 Other specified noninflammatory disorders of vagina: Secondary | ICD-10-CM | POA: Diagnosis not present

## 2020-01-15 LAB — WET PREP FOR TRICH, YEAST, CLUE

## 2020-01-15 MED ORDER — CLOBETASOL PROPIONATE 0.05 % EX CREA: 1.0000 "application " | TOPICAL_CREAM | Freq: Every evening | CUTANEOUS | 1 refills | Status: DC

## 2020-01-15 NOTE — Progress Notes (Signed)
[name] 04-03-1946 [id number]  SUBJECTIVE:  74 y.o. G2P2002 female here for an annual routine gynecologic exam.  She has had hip and back surgery in this last year.  She has been having a rash and itch in the perineal area and hemorrhoids as well.  She has applied Preparation H with minimal relief of the itching.  No vaginal bleeding.  She has no other gynecologic concerns.    Current Outpatient Medications  Medication Sig Dispense Refill  . ALPRAZolam (XANAX) 0.25 MG tablet TAKE 2 TABLETS BY MOUTH AT BEDTIME AS NEEDED FOR ANXIETY 60 tablet 1  . amLODipine (NORVASC) 5 MG tablet Take 1 tablet (5 mg total) by mouth daily. 90 tablet 3  . aspirin EC 81 MG tablet Take 1 tablet (81 mg total) by mouth 2 (two) times daily. For DVT prophylaxis for 30 days after surgery. 60 tablet 0  . benazepril (LOTENSIN) 40 MG tablet Take 1 tablet (40 mg total) by mouth daily. 90 tablet 3  . celecoxib (CELEBREX) 200 MG capsule 1 qd prn only 30 capsule 5  . ezetimibe (ZETIA) 10 MG tablet Take 1 tablet (10 mg total) by mouth daily. 90 tablet 3  . gabapentin (NEURONTIN) 100 MG capsule Take 1 capsule (100 mg total) by mouth 2 (two) times daily. For pain. 180 capsule 3  . levothyroxine (SYNTHROID) 100 MCG tablet Take 1 tablet (100 mcg total) by mouth daily. 90 tablet 0  . metoprolol succinate (TOPROL-XL) 50 MG 24 hr tablet Take 1 tablet (50 mg total) by mouth daily. Take with or immediately following a meal. (Patient taking differently: Take 50 mg by mouth at bedtime. Take with or immediately following a meal.) 90 tablet 3  . Misc Natural Products (TART CHERRY ADVANCED PO) Take 1 capsule by mouth daily.     . Multiple Vitamins-Minerals (CENTRUM PO) Take 1 tablet by mouth daily.     . potassium citrate (UROCIT-K 10) 10 MEQ (1080 MG) SR tablet Take 10 mEq by mouth daily.     . rosuvastatin (CRESTOR) 20 MG tablet Take 1 tablet (20 mg total) by mouth daily. 90 tablet 1   No current facility-administered medications for  this visit.   Allergies: Codeine sulfate and Erythromycin  No LMP recorded. Patient has had a hysterectomy.  Past medical history,surgical history, problem list, medications, allergies, family history and social history were all reviewed and documented as reviewed in the EPIC chart.  ROS:  Feeling well. No dyspnea or chest pain on exertion.  No abdominal pain, change in bowel habits, black or bloody stools.  No urinary tract symptoms. GYN ROS: no abnormal bleeding, pelvic pain or discharge, no breast pain or new or enlarging lumps on self exam. No neurological complaints.   OBJECTIVE:  BP 130/84   Ht 5\' 6"  (1.676 m)   Wt 161 lb (73 kg)   BMI 25.99 kg/m  The patient appears well, alert, oriented x 3, in no distress. ENT normal.  Neck supple. No cervical or supraclavicular adenopathy or thyromegaly.  Lungs are clear, good air entry, no wheezes, rhonchi or rales. S1 and S2 normal, no murmurs, regular rate and rhythm.  Abdomen soft without tenderness, guarding, mass or organomegaly.  Neurological is normal, no focal findings.  BREAST EXAM: breasts appear normal, no suspicious masses, no skin or nipple changes or axillary nodes  PELVIC EXAM: VULVA: normal appearing vulva with no masses, no tenderness, white patchy "parchment paper -like" lesions present on the posterior fourchette and also one  spot anteriorly on the right labia minora visually consistent with lichen sclerosis, VAGINA: Atrophic changes, normal appearing vagina with normal color and discharge, no lesions, CERVIX: Atrophic appearance otherwise normal, UTERUS: surgically absent, ADNEXA: no masses or tenderness, WET MOUNT done - results: negative for pathogens, normal epithelial cells  Chaperone: Caryn Bee present during the examination  ASSESSMENT:  74 y.o. [id number] here for annual gynecologic exam  PLAN:   1. Postmenopausal. Prior supracervical hysterectomy and BSO for benign indications involving heavy menstrual bleeding.   No significant hot flashes or night sweats.  No vaginal bleeding. 2.  Vulvar skin changes.  White patches on exam today appear to be consistent grossly with lichen sclerosis.  We discussed the skin condition and would be consistent with her pruritus symptoms.  I recommend initiating treatment with clobetasol cream 0.05% applied externally to the itchy areas nightly for 2 weeks and then she can decrease use to 3 times per week after that.  Encouraged her to either wear gloves or wash her fingers off very well after applying the medication to avoid skin atrophy on the finger.  She knows to let me know if in the next 2 to 3 months she is not noticing any improvement in her symptoms, at which point we should consider a liver skin biopsy in that case. 3. Pap smear 07/2018.  No significant history of abnormal Pap smears.  Next Pap smear would be due 2022 following the current guidelines recommending the 3 year interval, will address if she would like to continue screening at that time based on the age criteria and current guidelines. 4. Mammogram 12/2019.  Normal breast exam today.  Continue annual mammograms. 5. Colonoscopy 2013.  Recommended that she follow up at the recommended interval.   6. Osteopenia.  DEXA 02/2018.  T score -1.8.  FRAX 11.7% / 2.4%.  Next DEXA recommended this year so she plans to schedule this. 7. Health maintenance.  No labs today as she normally has these completed elsewhere.  Return annually or sooner, prn.  Joseph Pierini MD 01/15/20

## 2020-01-16 ENCOUNTER — Other Ambulatory Visit: Payer: Self-pay | Admitting: Internal Medicine

## 2020-01-16 DIAGNOSIS — E782 Mixed hyperlipidemia: Secondary | ICD-10-CM

## 2020-01-21 DIAGNOSIS — M7662 Achilles tendinitis, left leg: Secondary | ICD-10-CM | POA: Diagnosis not present

## 2020-02-06 ENCOUNTER — Other Ambulatory Visit: Payer: Self-pay | Admitting: Family

## 2020-02-06 DIAGNOSIS — F32A Depression, unspecified: Secondary | ICD-10-CM

## 2020-02-09 DIAGNOSIS — H2511 Age-related nuclear cataract, right eye: Secondary | ICD-10-CM | POA: Diagnosis not present

## 2020-02-10 ENCOUNTER — Other Ambulatory Visit: Payer: Self-pay | Admitting: Family

## 2020-02-10 DIAGNOSIS — I10 Essential (primary) hypertension: Secondary | ICD-10-CM

## 2020-02-23 DIAGNOSIS — H2512 Age-related nuclear cataract, left eye: Secondary | ICD-10-CM | POA: Diagnosis not present

## 2020-02-26 ENCOUNTER — Other Ambulatory Visit: Payer: Self-pay

## 2020-02-26 ENCOUNTER — Other Ambulatory Visit: Payer: Medicare Other

## 2020-02-26 DIAGNOSIS — E0590 Thyrotoxicosis, unspecified without thyrotoxic crisis or storm: Secondary | ICD-10-CM

## 2020-02-26 LAB — TSH: TSH: 0.16 u[IU]/mL — ABNORMAL LOW (ref 0.35–4.50)

## 2020-02-27 ENCOUNTER — Other Ambulatory Visit: Payer: Self-pay | Admitting: Family

## 2020-02-27 ENCOUNTER — Encounter: Payer: Self-pay | Admitting: Family

## 2020-02-27 MED ORDER — LEVOTHYROXINE SODIUM 88 MCG PO TABS
88.0000 ug | ORAL_TABLET | Freq: Every day | ORAL | 0 refills | Status: DC
Start: 2020-02-27 — End: 2020-04-29

## 2020-03-30 ENCOUNTER — Other Ambulatory Visit: Payer: Self-pay | Admitting: Family

## 2020-03-30 DIAGNOSIS — F419 Anxiety disorder, unspecified: Secondary | ICD-10-CM

## 2020-03-30 DIAGNOSIS — F329 Major depressive disorder, single episode, unspecified: Secondary | ICD-10-CM

## 2020-04-01 NOTE — Telephone Encounter (Signed)
On [place] controlled sub website last refilled 03/08/20 by Valere Dross, [place] 12/02/19 Future OV: none scheduled

## 2020-04-02 ENCOUNTER — Encounter: Payer: Self-pay | Admitting: Family

## 2020-04-02 DIAGNOSIS — F419 Anxiety disorder, unspecified: Secondary | ICD-10-CM

## 2020-04-02 MED ORDER — ALPRAZOLAM 0.25 MG PO TABS: ORAL_TABLET | ORAL | 1 refills | Status: DC

## 2020-04-02 NOTE — Telephone Encounter (Signed)
Mickel Baas is out of the office pls advise on pt email.Marland KitchenJohny Chess

## 2020-04-22 ENCOUNTER — Encounter: Payer: Self-pay | Admitting: Family

## 2020-04-23 ENCOUNTER — Other Ambulatory Visit: Payer: Self-pay | Admitting: Family

## 2020-04-23 DIAGNOSIS — E039 Hypothyroidism, unspecified: Secondary | ICD-10-CM

## 2020-04-28 ENCOUNTER — Other Ambulatory Visit: Payer: Self-pay

## 2020-04-28 ENCOUNTER — Other Ambulatory Visit: Payer: Medicare Other

## 2020-04-28 DIAGNOSIS — E039 Hypothyroidism, unspecified: Secondary | ICD-10-CM | POA: Diagnosis not present

## 2020-04-29 ENCOUNTER — Other Ambulatory Visit: Payer: Self-pay | Admitting: Family

## 2020-04-29 ENCOUNTER — Encounter: Payer: Self-pay | Admitting: Family

## 2020-04-29 LAB — THYROID PANEL WITH TSH
Free Thyroxine Index: 2.8 (ref 1.4–3.8)
T3 Uptake: 27 % (ref 22–35)
T4, Total: 10.2 ug/dL (ref 5.1–11.9)
TSH: 1.47 mIU/L (ref 0.40–4.50)

## 2020-04-29 MED ORDER — LEVOTHYROXINE SODIUM 88 MCG PO TABS
88.0000 ug | ORAL_TABLET | Freq: Every day | ORAL | 1 refills | Status: DC
Start: 2020-04-29 — End: 2020-12-09

## 2020-04-30 ENCOUNTER — Other Ambulatory Visit: Payer: Self-pay | Admitting: Family

## 2020-04-30 DIAGNOSIS — M79673 Pain in unspecified foot: Secondary | ICD-10-CM

## 2020-05-06 ENCOUNTER — Ambulatory Visit: Payer: Medicare Other | Admitting: Podiatry

## 2020-05-06 ENCOUNTER — Ambulatory Visit (INDEPENDENT_AMBULATORY_CARE_PROVIDER_SITE_OTHER): Payer: Medicare Other

## 2020-05-06 ENCOUNTER — Other Ambulatory Visit: Payer: Self-pay

## 2020-05-06 DIAGNOSIS — S9030XA Contusion of unspecified foot, initial encounter: Secondary | ICD-10-CM | POA: Diagnosis not present

## 2020-05-06 DIAGNOSIS — M79672 Pain in left foot: Secondary | ICD-10-CM | POA: Diagnosis not present

## 2020-05-06 DIAGNOSIS — M79671 Pain in right foot: Secondary | ICD-10-CM | POA: Diagnosis not present

## 2020-05-06 DIAGNOSIS — M7662 Achilles tendinitis, left leg: Secondary | ICD-10-CM

## 2020-05-06 NOTE — Patient Instructions (Signed)

## 2020-05-06 NOTE — Progress Notes (Signed)
Subjective:   Patient ID: [name], female   DOB: 74 y.o.   MRN: [id number]   HPI Patient presents with a lot of pain in the left ankle and Achilles and states that it is been hard for her to walk comfortably or wear shoe gear comfortably.  States this is been going on for around 4 months and gradually getting worse.  Patient does not smoke likes to be active   Review of Systems  All other systems reviewed and are negative.       Objective:  Physical Exam Vitals and nursing note reviewed.  Constitutional:      Appearance: She is well-developed.  Pulmonary:     Effort: Pulmonary effort is normal.  Musculoskeletal:        General: Normal range of motion.  Skin:    General: Skin is warm.  Neurological:     Mental Status: She is alert.     Neurovascular status found to be intact muscle strength was adequate range of motion within normal limits.  Patient is found to have acute inflammation of the posterior medial aspect of the left heel that is very tender when pressed with no center lateral involvement currently.  There is good digital perfusion patient is well oriented x3     Assessment:  Appears to be acute Achilles tendinitis left medial side slight into the central portion of the tendon     Plan:  H&P reviewed conditions.  Today I went ahead I did sterile prep I did inject the medial side after explaining to her the chances for rupture and I utilized dexamethasone and Xylocaine.  I then applied air fracture walker to completely immobilize advised on ice therapy and reappoint to recheck  X-rays indicate small spur formation heel posterior left over right that appears to be recent and its origin

## 2020-05-10 DIAGNOSIS — M1612 Unilateral primary osteoarthritis, left hip: Secondary | ICD-10-CM | POA: Diagnosis not present

## 2020-05-24 ENCOUNTER — Ambulatory Visit (INDEPENDENT_AMBULATORY_CARE_PROVIDER_SITE_OTHER): Payer: Medicare Other | Admitting: Family

## 2020-05-24 ENCOUNTER — Other Ambulatory Visit: Payer: Self-pay

## 2020-05-24 VITALS — BP 140/82 | HR 98 | Temp 98.2°F | Ht 66.0 in | Wt 157.0 lb

## 2020-05-24 DIAGNOSIS — M25552 Pain in left hip: Secondary | ICD-10-CM | POA: Diagnosis not present

## 2020-05-24 DIAGNOSIS — Z23 Encounter for immunization: Secondary | ICD-10-CM

## 2020-05-24 DIAGNOSIS — E785 Hyperlipidemia, unspecified: Secondary | ICD-10-CM | POA: Diagnosis not present

## 2020-05-24 DIAGNOSIS — R32 Unspecified urinary incontinence: Secondary | ICD-10-CM

## 2020-05-24 DIAGNOSIS — M8580 Other specified disorders of bone density and structure, unspecified site: Secondary | ICD-10-CM

## 2020-05-24 MED ORDER — OXYBUTYNIN CHLORIDE 5 MG PO TABS: 5.0000 mg | ORAL_TABLET | Freq: Three times a day (TID) | ORAL | 0 refills | Status: DC | PRN

## 2020-05-24 NOTE — Addendum Note (Signed)
Addended by: Marcina Millard on: 05/24/2020 01:10 PM   Modules accepted: Orders

## 2020-05-24 NOTE — Progress Notes (Signed)
[name] is a 74 y.o. female with the following history as recorded in EpicCare:  Patient Active Problem List   Diagnosis Date Noted  . Primary localized osteoarthritis of hip 06/24/2019  . Primary osteoarthritis of left hip 06/02/2019  . S/P lumbar fusion 04/23/2019  . Lumbar spinal stenosis 07/16/2018  . Renal cyst 07/16/2018  . Left lumbar radiculopathy 06/24/2018  . Left hip pain 10/25/2017  . Greater trochanteric bursitis of left hip 10/03/2017  . Anxiety and depression 05/23/2017  . Hyperlipidemia 04/21/2016  . Routine general medical examination at a health care facility 01/05/2016  . Medicare annual wellness visit, subsequent 01/05/2016  . Allergic rhinitis 12/29/2015  . Vaginal atrophy 10/22/2013  . DJD (degenerative joint disease) of knee 10/18/2012  . NEPHRECTOMY, HX OF 05/28/2009  . Hypothyroidism, postsurgical 07/18/2007  . HYPERLIPIDEMIA 07/18/2007  . Essential hypertension 07/18/2007  . OSTEOPENIA 07/15/2007  . History of colonic polyps 07/15/2007    Current Outpatient Medications  Medication Sig Dispense Refill  . ALPRAZolam (XANAX) 0.25 MG tablet TAKE 2 TABLETS BY MOUTH AT BEDTIME AS NEEDED FOR ANXIETY 60 tablet 1  . amLODipine (NORVASC) 5 MG tablet Take 1 tablet (5 mg total) by mouth daily. 90 tablet 3  . amoxicillin (AMOXIL) 500 MG capsule SMARTSIG:4 Capsule(s) By Mouth Once    . aspirin EC 81 MG tablet Take 1 tablet (81 mg total) by mouth 2 (two) times daily. For DVT prophylaxis for 30 days after surgery. 60 tablet 0  . benazepril (LOTENSIN) 40 MG tablet Take 1 tablet (40 mg total) by mouth daily. 90 tablet 3  . celecoxib (CELEBREX) 200 MG capsule 1 qd prn only 30 capsule 5  . clobetasol cream (TEMOVATE) 8.36 % Apply 1 application topically at bedtime. After 2 weeks, decrease use to only 2-3 nights per week 60 g 1  . ezetimibe (ZETIA) 10 MG tablet Take 1 tablet (10 mg total) by mouth daily. 90 tablet 3  . gabapentin (NEURONTIN) 100 MG capsule Take 1  capsule (100 mg total) by mouth 2 (two) times daily. For pain. 180 capsule 3  . levothyroxine (SYNTHROID) 88 MCG tablet Take 1 tablet (88 mcg total) by mouth daily. 90 tablet 1  . metoprolol succinate (TOPROL-XL) 50 MG 24 hr tablet TAKE 1 TABLET (50 MG TOTAL) BY MOUTH DAILY. TAKE WITH OR IMMEDIATELY FOLLOWING A MEAL. 90 tablet 3  . Misc Natural Products (TART CHERRY ADVANCED PO) Take 1 capsule by mouth daily.     . Multiple Vitamins-Minerals (CENTRUM PO) Take 1 tablet by mouth daily.     . nitroGLYCERIN (NITRODUR - DOSED IN MG/24 HR) 0.2 mg/hr patch Place onto the skin. (Patient not taking: Reported on 05/24/2020)    . oxybutynin (DITROPAN) 5 MG tablet Take 1 tablet (5 mg total) by mouth every 8 (eight) hours as needed for bladder spasms. 60 tablet 0  . potassium citrate (UROCIT-K 10) 10 MEQ (1080 MG) SR tablet Take 10 mEq by mouth daily.     . rosuvastatin (CRESTOR) 20 MG tablet Take 1 tablet (20 mg total) by mouth daily. 90 tablet 1   No current facility-administered medications for this visit.    Allergies: Codeine sulfate and Erythromycin  Past Medical History:  Diagnosis Date  . Adenomatous colon polyp   . Arthritis   . Cataract   . Complication of anesthesia   . Heart murmur    MVP  . Hyperlipidemia   . Hypertension   . Hypothyroidism   . PONV (postoperative nausea  and vomiting)   . Thyroid disease     Past Surgical History:  Procedure Laterality Date  . BACK SURGERY    . BREAST EXCISIONAL BIOPSY Right 1983  . COLONOSCOPY W/ POLYPECTOMY  2003,2006,2013   [name]  . DIAGNOSTIC LAPAROSCOPY  1979   exploratory to get pregnant  . DILATION AND CURETTAGE OF UTERUS     X2  . NEPHRECTOMY  2010   Left for 9.8cm benign complex cyst  . THYROIDECTOMY  2000   [name]  . TONSILLECTOMY    . TOTAL ABDOMINAL HYSTERECTOMY W/ BILATERAL SALPINGOOPHORECTOMY  2003   [name] for dysfunctional menses  . TOTAL HIP ARTHROPLASTY Left 06/24/2019   Procedure: TOTAL HIP ARTHROPLASTY ANTERIOR  APPROACH;  Surgeon: Renette Butters, MD;  Location: [facility];  Service: Orthopedics;  Laterality: Left;    Family History  Problem Relation Age of Onset  . Transient ischemic attack Father        in 21s  . Coronary artery disease Father        stents @ 61  . Nephrolithiasis Father   . Hyperlipidemia Mother   . Transient ischemic attack Mother 49  . Thyroid disease Mother        hyperthyroid  . Urolithiasis Sister   . Diabetes Neg Hx     Social History   Tobacco Use  . Smoking status: Former Smoker    Packs/day: 0.50    Years: 4.00    Pack years: 2.00    Quit date: 08/29/1975    Years since quitting: 44.7  . Smokeless tobacco: Never Used  . Tobacco comment: smoked 1973-1977, up to 1/2 ppd  Substance Use Topics  . Alcohol use: Yes    Alcohol/week: 0.0 standard drinks    Comment: minimally  1 every 6 months    Subjective:  Presents with numerous concerns: 1) Requesting to go back to Dr. Tamala Julian about some continued left thigh pain after hip replacement; 2) Needs updated DEXA- last done in 2019; 3) Would like flu shot; 4) Needs to get lipids re-checked; has not started Repatha due to cost concerns; 5) Some urinary leakage issues especially at night- has to wake up 1 x per night to go to the bathroom;      Objective:  Vitals:   05/24/20 1041  BP: 140/82  Pulse: 98  Temp: 98.2 F (36.8 C)  TempSrc: Oral  SpO2: 99%  Weight: 157 lb (71.2 kg)  Height: _0  (1.676 m)    General: Well developed, well nourished, in no acute distress  Head: Normocephalic and atraumatic  Lungs: Respirations unlabored; clear to auscultation bilaterally without wheeze, rales, rhonchi  Musculoskeletal: No deformities; no active joint inflammation  Extremities: No edema, cyanosis, clubbing  Vessels: Symmetric bilaterally  Neurologic: Alert and oriented; speech intact; face symmetrical; moves all extremities well; CNII-XII intact without focal deficit   Assessment:  1. Osteopenia determined  by x-ray   2. Hyperlipidemia, unspecified hyperlipidemia type   3. Urinary incontinence, unspecified type   4. Needs flu shot   5. Left hip pain     Plan:  1. Update DEXA; 2. Check CMP, lipid panel; 3. Trial of Oxybutynin at night prn;  4. Flu shot given; 5. Refer to Dr. Tamala Julian;  This visit occurred during the SARS-CoV-2 public health emergency.  Safety protocols were in place, including screening questions prior to the visit, additional usage of staff PPE, and extensive cleaning of exam room while observing appropriate contact time as indicated for disinfecting  solutions.     No follow-ups on file.  Orders Placed This Encounter  Procedures  . DG Bone Density    Standing Status:   Future    Standing Expiration Date:   05/24/2021    Order Specific Question:   Reason for Exam (SYMPTOM  OR DIAGNOSIS REQUIRED)    Answer:   osteopenia    Order Specific Question:   Preferred imaging location?    Answer:   [facility]  . Comp Met ([facility])    Standing Status:   Future    Standing Expiration Date:   05/24/2021  . Lipid panel    Standing Status:   Future    Standing Expiration Date:   05/24/2021    Requested Prescriptions   Signed Prescriptions Disp Refills  . oxybutynin (DITROPAN) 5 MG tablet 60 tablet 0    Sig: Take 1 tablet (5 mg total) by mouth every 8 (eight) hours as needed for bladder spasms.

## 2020-05-31 ENCOUNTER — Ambulatory Visit: Payer: Medicare Other | Admitting: Podiatry

## 2020-06-01 ENCOUNTER — Ambulatory Visit (INDEPENDENT_AMBULATORY_CARE_PROVIDER_SITE_OTHER)
Admission: RE | Admit: 2020-06-01 | Discharge: 2020-06-01 | Disposition: A | Discharge status: Home / Self Care | Payer: Medicare Other | Source: Ambulatory Visit | Attending: Family | Admitting: Family

## 2020-06-01 DIAGNOSIS — M8580 Other specified disorders of bone density and structure, unspecified site: Secondary | ICD-10-CM | POA: Diagnosis not present

## 2020-06-01 DIAGNOSIS — Z0182 Encounter for allergy testing: Secondary | ICD-10-CM | POA: Diagnosis not present

## 2020-06-08 ENCOUNTER — Other Ambulatory Visit: Payer: Self-pay | Admitting: Internal Medicine

## 2020-06-08 DIAGNOSIS — F419 Anxiety disorder, unspecified: Secondary | ICD-10-CM

## 2020-06-08 NOTE — Telephone Encounter (Signed)
Check [place] registry last filled 05/12/2020.Marland KitchenJohny Carter

## 2020-06-10 ENCOUNTER — Encounter: Payer: Self-pay | Admitting: Family

## 2020-06-14 ENCOUNTER — Ambulatory Visit: Payer: Medicare Other | Admitting: Family Medicine

## 2020-06-15 ENCOUNTER — Other Ambulatory Visit: Payer: Self-pay

## 2020-06-15 ENCOUNTER — Ambulatory Visit: Payer: Medicare Other | Admitting: Family Medicine

## 2020-06-15 ENCOUNTER — Encounter: Payer: Self-pay | Admitting: Family Medicine

## 2020-06-15 DIAGNOSIS — Z96642 Presence of left artificial hip joint: Secondary | ICD-10-CM | POA: Diagnosis not present

## 2020-06-15 DIAGNOSIS — G8929 Other chronic pain: Secondary | ICD-10-CM | POA: Diagnosis not present

## 2020-06-15 DIAGNOSIS — M25552 Pain in left hip: Secondary | ICD-10-CM | POA: Diagnosis not present

## 2020-06-15 NOTE — Progress Notes (Signed)
[facility] [address] [place] Phone: [phone] Subjective:   I [name] am serving as a Education administrator for Dr. Hulan Saas.  This visit occurred during the SARS-CoV-2 public health emergency.  Safety protocols were in place, including screening questions prior to the visit, additional usage of staff PPE, and extensive cleaning of exam room while observing appropriate contact time as indicated for disinfecting solutions.   I'm seeing this patient by the request  of:  Marrian Salvage, FNP  CC: Left hip pain  NKN:[id number]   03/05/2019 Patient given injection and tolerated the procedure well.  Discussed icing regimen and home exercise.  Discussed increasing activity slowly.  Follow-up again 4 to 6 weeks.  Do believe some of the pain is secondary to the radicular symptoms of patient's lumbar spine.   Update 06/15/2020 [name] is a 74 y.o. female coming in with complaint of left hip pain. Patient states she had surgery on the hip. Feels knots in the upper thigh and states her stamina has decreased. Has put a heel lift into the left shoe and states the pain has gotten better. Had an injection for bursitis about a month ago. Patient is right side dominant and states her left side is not aligned correctly. Has tried ice and Celebrex every MWF. Tries not to take IBprofen but takes it when she has a lot of pain.  Patient states that her surgeon does state that the hip is fine at this time.  Patient just does not understand why she continues to have discomfort and pain.      Past Medical History:  Diagnosis Date  . Adenomatous colon polyp   . Arthritis   . Cataract   . Complication of anesthesia   . Heart murmur    MVP  . Hyperlipidemia   . Hypertension   . Hypothyroidism   . PONV (postoperative nausea and vomiting)   . Thyroid disease    Past Surgical History:  Procedure Laterality Date  . BACK SURGERY    . BREAST  EXCISIONAL BIOPSY Right 1983  . COLONOSCOPY W/ POLYPECTOMY  2003,2006,2013   [name]  . DIAGNOSTIC LAPAROSCOPY  1979   exploratory to get pregnant  . DILATION AND CURETTAGE OF UTERUS     X2  . NEPHRECTOMY  2010   Left for 9.8cm benign complex cyst  . THYROIDECTOMY  2000   [name]  . TONSILLECTOMY    . TOTAL ABDOMINAL HYSTERECTOMY W/ BILATERAL SALPINGOOPHORECTOMY  2003   [name] for dysfunctional menses  . TOTAL HIP ARTHROPLASTY Left 06/24/2019   Procedure: TOTAL HIP ARTHROPLASTY ANTERIOR APPROACH;  Surgeon: Renette Butters, MD;  Location: [facility];  Service: Orthopedics;  Laterality: Left;   Social History   Socioeconomic History  . Marital status: Married    Spouse name: Not on file  . Number of children: 2  . Years of education: Not on file  . Highest education level: Not on file  Occupational History  . Occupation: Pharmacist, community  Tobacco Use  . Smoking status: Former Smoker    Packs/day: 0.50    Years: 4.00    Pack years: 2.00    Quit date: 08/29/1975    Years since quitting: 44.8  . Smokeless tobacco: Never Used  . Tobacco comment: smoked 1973-1977, up to 1/2 ppd  Vaping Use  . Vaping Use: Never used  Substance and Sexual Activity  . Alcohol use: Yes    Alcohol/week: 0.0 standard  drinks    Comment: minimally  1 every 6 months  . Drug use: No  . Sexual activity: Yes    Comment: HYST-1st intercourse 63 yo-5 partners  Other Topics Concern  . Not on file  Social History Narrative   Fun/Hobbies: Grandkids   Social Determinants of Health   Financial Resource Strain:   . Difficulty of Paying Living Expenses: Not on file  Food Insecurity:   . Worried About Charity fundraiser in the Last Year: Not on file  . Ran Out of Food in the Last Year: Not on file  Transportation Needs:   . Lack of Transportation (Medical): Not on file  . Lack of Transportation (Non-Medical): Not on file  Physical Activity:   . Days of Exercise per Week: Not on file  . Minutes of  Exercise per Session: Not on file  Stress:   . Feeling of Stress : Not on file  Social Connections:   . Frequency of Communication with Friends and Family: Not on file  . Frequency of Social Gatherings with Friends and Family: Not on file  . Attends Religious Services: Not on file  . Active Member of Clubs or Organizations: Not on file  . Attends Archivist Meetings: Not on file  . Marital Status: Not on file   Allergies  Allergen Reactions  . Codeine Sulfate     "made me jumpy"  . Erythromycin Nausea Only   Family History  Problem Relation Age of Onset  . Transient ischemic attack Father        in 94s  . Coronary artery disease Father        stents @ 33  . Nephrolithiasis Father   . Hyperlipidemia Mother   . Transient ischemic attack Mother 84  . Thyroid disease Mother        hyperthyroid  . Urolithiasis Sister   . Diabetes Neg Hx     Current Outpatient Medications (Endocrine & Metabolic):  .  levothyroxine (SYNTHROID) 88 MCG tablet, Take 1 tablet (88 mcg total) by mouth daily.  Current Outpatient Medications (Cardiovascular):  .  amLODipine (NORVASC) 5 MG tablet, Take 1 tablet (5 mg total) by mouth daily. .  benazepril (LOTENSIN) 40 MG tablet, Take 1 tablet (40 mg total) by mouth daily. .  metoprolol succinate (TOPROL-XL) 50 MG 24 hr tablet, TAKE 1 TABLET (50 MG TOTAL) BY MOUTH DAILY. TAKE WITH OR IMMEDIATELY FOLLOWING A MEAL. .  nitroGLYCERIN (NITRODUR - DOSED IN MG/24 HR) 0.2 mg/hr patch, Place onto the skin.  .  rosuvastatin (CRESTOR) 20 MG tablet, Take 1 tablet (20 mg total) by mouth daily. [name]  ezetimibe (ZETIA) 10 MG tablet, Take 1 tablet (10 mg total) by mouth daily.   Current Outpatient Medications (Analgesics):  .  aspirin EC 81 MG tablet, Take 1 tablet (81 mg total) by mouth 2 (two) times daily. For DVT prophylaxis for 30 days after surgery. .  celecoxib (CELEBREX) 200 MG capsule, 1 qd prn only   Current Outpatient Medications (Other):  [name]   ALPRAZolam (XANAX) 0.25 MG tablet, TAKE 2 TABLETS BY MOUTH AT BEDTIME AS NEEDED FOR ANXIETY .  amoxicillin (AMOXIL) 500 MG capsule, SMARTSIG:4 Capsule(s) By Mouth Once .  clobetasol cream (TEMOVATE) 4.62 %, Apply 1 application topically at bedtime. After 2 weeks, decrease use to only 2-3 nights per week .  gabapentin (NEURONTIN) 100 MG capsule, Take 1 capsule (100 mg total) by mouth 2 (two) times daily. For pain. .  Misc Natural  Products (TART CHERRY ADVANCED PO), Take 1 capsule by mouth daily.  .  Multiple Vitamins-Minerals (CENTRUM PO), Take 1 tablet by mouth daily.  [name]  oxybutynin (DITROPAN) 5 MG tablet, Take 1 tablet (5 mg total) by mouth every 8 (eight) hours as needed for bladder spasms. .  potassium citrate (UROCIT-K 10) 10 MEQ (1080 MG) SR tablet, Take 10 mEq by mouth daily.    Reviewed prior external information including notes and imaging from  primary care provider As well as notes that were available from care everywhere and other healthcare systems.  Past medical history, social, surgical and family history all reviewed in electronic medical record.  No pertanent information unless stated regarding to the chief complaint.   Review of Systems:  No headache, visual changes, nausea, vomiting, diarrhea, constipation, dizziness, abdominal pain, skin rash, fevers, chills, night sweats, weight loss, swollen lymph nodes, body aches, joint swelling, chest pain, shortness of breath, mood changes. POSITIVE muscle aches  Objective  Blood pressure 130/82, pulse 80, height 5\' 6"  (1.676 m), weight 160 lb (72.6 kg), SpO2 96 %.   General: No apparent distress alert and oriented x3 mood and affect normal, dressed appropriately.  HEENT: Pupils equal, extraocular movements intact  Respiratory: Patient's speak in full sentences and does not appear short of breath  Cardiovascular: No lower extremity edema, non tender, no erythema .  Gait antalgic favoring the left hip MSK: Left hip exam does have a  mild positive fulcrum.  Good internal and external range of motion.  Questionable decreased range of motion with flexion of 5 degrees secondary to pain.  Palpating the anterior aspect of the hip no true mass appreciated, has some mild atrophy of the quadricep compared to the contralateral side.  Patient is pointing of the groin area questionable lipoma but not true mass felt.  More likely fatty atrophy.    Impression and Recommendations:     The above documentation has been reviewed and is accurate and complete Lyndal Pulley, DO

## 2020-06-15 NOTE — Assessment & Plan Note (Signed)
Patient is having some pain in the left hip.  Very mild positive fulcrum.  Patient does have a abnormality gait is likely just patient needs more training.  Patient is concerned though because she feels like there is potential abnormality of the joint itself.  What complicates the picture is patient also did have back surgery 2 months prior to this surgery.  Patient will have a bone scan to further evaluate the integrity of the hip replacement.  No significant findings of loosening other than the positive fulcrum test today.  Patient did not respond well to the greater trochanteric injection by the other provider either.  Once again differential includes a lumbar radiculopathy and will consider other medications.  Patient will follow up after the bone scan was discussed.

## 2020-06-15 NOTE — Patient Instructions (Signed)
Bone scan-left hip 2000-4000IU daily Thigh compression with activity Once you know when bone scan is, lets try to see you a few days afterwards

## 2020-06-24 ENCOUNTER — Encounter (HOSPITAL_COMMUNITY)
Admission: RE | Admit: 2020-06-24 | Discharge: 2020-06-24 | Disposition: A | Discharge status: Home / Self Care | Payer: Medicare Other | Source: Ambulatory Visit | Attending: Family Medicine | Admitting: Family Medicine

## 2020-06-24 ENCOUNTER — Other Ambulatory Visit: Payer: Self-pay

## 2020-06-24 DIAGNOSIS — Z96642 Presence of left artificial hip joint: Secondary | ICD-10-CM

## 2020-06-24 DIAGNOSIS — M25552 Pain in left hip: Secondary | ICD-10-CM | POA: Diagnosis not present

## 2020-06-24 DIAGNOSIS — R948 Abnormal results of function studies of other organs and systems: Secondary | ICD-10-CM | POA: Diagnosis not present

## 2020-06-24 MED ORDER — TECHNETIUM TC 99M MEDRONATE IV KIT
20.0000 | PACK | Freq: Once | INTRAVENOUS | Status: AC | PRN
  Administered 2020-06-24: 20 via INTRAVENOUS

## 2020-06-25 ENCOUNTER — Other Ambulatory Visit: Payer: Self-pay | Admitting: Family

## 2020-06-25 ENCOUNTER — Encounter: Payer: Self-pay | Admitting: Family Medicine

## 2020-06-25 DIAGNOSIS — M25552 Pain in left hip: Secondary | ICD-10-CM

## 2020-07-03 DIAGNOSIS — M25552 Pain in left hip: Secondary | ICD-10-CM | POA: Diagnosis not present

## 2020-07-03 DIAGNOSIS — Z96642 Presence of left artificial hip joint: Secondary | ICD-10-CM | POA: Diagnosis not present

## 2020-07-14 ENCOUNTER — Other Ambulatory Visit: Payer: Self-pay | Admitting: Family

## 2020-07-14 DIAGNOSIS — E782 Mixed hyperlipidemia: Secondary | ICD-10-CM

## 2020-08-06 DIAGNOSIS — N200 Calculus of kidney: Secondary | ICD-10-CM | POA: Diagnosis not present

## 2020-08-10 ENCOUNTER — Other Ambulatory Visit: Payer: Self-pay | Admitting: Family

## 2020-08-10 DIAGNOSIS — F419 Anxiety disorder, unspecified: Secondary | ICD-10-CM

## 2020-08-25 ENCOUNTER — Encounter: Payer: Self-pay | Admitting: Family Medicine

## 2020-08-25 ENCOUNTER — Other Ambulatory Visit: Payer: Self-pay

## 2020-08-25 ENCOUNTER — Ambulatory Visit: Payer: Self-pay

## 2020-08-25 ENCOUNTER — Ambulatory Visit: Payer: Medicare Other | Admitting: Family Medicine

## 2020-08-25 VITALS — BP 140/90 | HR 79 | Ht 66.0 in | Wt 165.0 lb

## 2020-08-25 DIAGNOSIS — M7662 Achilles tendinitis, left leg: Secondary | ICD-10-CM | POA: Insufficient documentation

## 2020-08-25 DIAGNOSIS — M25552 Pain in left hip: Secondary | ICD-10-CM | POA: Diagnosis not present

## 2020-08-25 DIAGNOSIS — M79672 Pain in left foot: Secondary | ICD-10-CM

## 2020-08-25 DIAGNOSIS — G8929 Other chronic pain: Secondary | ICD-10-CM | POA: Diagnosis not present

## 2020-08-25 DIAGNOSIS — Z96642 Presence of left artificial hip joint: Secondary | ICD-10-CM | POA: Diagnosis not present

## 2020-08-25 NOTE — Progress Notes (Signed)
[facility] [address] [place] [address] Phone: [phone] Subjective:   I [name] am serving as a Neurosurgeon for Dr. Antoine Primas.  This visit occurred during the SARS-CoV-2 public health emergency.  Safety protocols were in place, including screening questions prior to the visit, additional usage of staff PPE, and extensive cleaning of exam room while observing appropriate contact time as indicated for disinfecting solutions.   I'm seeing this patient by the request  of:  Olive Bass, FNP  CC: Left heel pain, left hip pain follow-up  [id number]   06/15/2020 Patient is having some pain in the left hip.  Very mild positive fulcrum.  Patient does have a abnormality gait is likely just patient needs more training.  Patient is concerned though because she feels like there is potential abnormality of the joint itself.  What complicates the picture is patient also did have back surgery 2 months prior to this surgery.  Patient will have a bone scan to further evaluate the integrity of the hip replacement.  No significant findings of loosening other than the positive fulcrum test today.  Patient did not respond well to the greater trochanteric injection by the other provider either.  Once again differential includes a lumbar radiculopathy and will consider other medications.  Patient will follow up after the bone scan was discussed.  Update 09/26/2019 [name] is a 74 y.o. female coming in with complaint of left hip pain and left heel pain. THR on left hip. States the left heel is terrible. Has a bone spur that is causing a lot of pain. Hip is not any worse. Going back to Dr. Turner Daniels in 3 months. Heel is worse than the hip today.  Patient had seen a another provider for the heel previously and had been diagnosed with an Achilles tendinitis.  Patient was put in a cam walker but this caused more the discomfort in the hip.    06/24/2020 Bone  scan-left hip IMPRESSION: Normal blood flow and blood pool images.  Uptake adjacent to the femoral component of the LEFT hip prosthesis in a pattern suspicious for aseptic loosening.   Past Medical History:  Diagnosis Date  . Adenomatous colon polyp   . Arthritis   . Cataract   . Complication of anesthesia   . Heart murmur    MVP  . Hyperlipidemia   . Hypertension   . Hypothyroidism   . PONV (postoperative nausea and vomiting)   . Thyroid disease    Past Surgical History:  Procedure Laterality Date  . BACK SURGERY    . BREAST EXCISIONAL BIOPSY Right 1983  . COLONOSCOPY W/ POLYPECTOMY  2003,2006,2013   [name]  . DIAGNOSTIC LAPAROSCOPY  1979   exploratory to get pregnant  . DILATION AND CURETTAGE OF UTERUS     X2  . NEPHRECTOMY  2010   Left for 9.8cm benign complex cyst  . THYROIDECTOMY  2000   Dr Orpah Greek  . TONSILLECTOMY    . TOTAL ABDOMINAL HYSTERECTOMY W/ BILATERAL SALPINGOOPHORECTOMY  2003   [name] for dysfunctional menses  . TOTAL HIP ARTHROPLASTY Left 06/24/2019   Procedure: TOTAL HIP ARTHROPLASTY ANTERIOR APPROACH;  Surgeon: Sheral Apley, MD;  Location: [facility];  Service: Orthopedics;  Laterality: Left;   Social History   Socioeconomic History  . Marital status: Married    Spouse name: Not on file  . Number of children: 2  . Years of education: Not on file  . Highest  education level: Not on file  Occupational History  . Occupation: Pharmacist, community  Tobacco Use  . Smoking status: Former Smoker    Packs/day: 0.50    Years: 4.00    Pack years: 2.00    Quit date: 08/29/1975    Years since quitting: 45.0  . Smokeless tobacco: Never Used  . Tobacco comment: smoked 1973-1977, up to 1/2 ppd  Vaping Use  . Vaping Use: Never used  Substance and Sexual Activity  . Alcohol use: Yes    Alcohol/week: 0.0 standard drinks    Comment: minimally  1 every 6 months  . Drug use: No  . Sexual activity: Yes    Comment: HYST-1st intercourse 4 yo-5 partners  Other  Topics Concern  . Not on file  Social History Narrative   Fun/Hobbies: Grandkids   Social Determinants of Health   Financial Resource Strain: Not on file  Food Insecurity: Not on file  Transportation Needs: Not on file  Physical Activity: Not on file  Stress: Not on file  Social Connections: Not on file   Allergies  Allergen Reactions  . Codeine Sulfate     "made me jumpy"  . Erythromycin Nausea Only   Family History  Problem Relation Age of Onset  . Transient ischemic attack Father        in 55s  . Coronary artery disease Father        stents @ 87  . Nephrolithiasis Father   . Hyperlipidemia Mother   . Transient ischemic attack Mother 45  . Thyroid disease Mother        hyperthyroid  . Urolithiasis Sister   . Diabetes Neg Hx     Current Outpatient Medications (Endocrine & Metabolic):  .  levothyroxine (SYNTHROID) 88 MCG tablet, Take 1 tablet (88 mcg total) by mouth daily.  Current Outpatient Medications (Cardiovascular):  .  amLODipine (NORVASC) 5 MG tablet, Take 1 tablet (5 mg total) by mouth daily. .  benazepril (LOTENSIN) 40 MG tablet, Take 1 tablet (40 mg total) by mouth daily. .  metoprolol succinate (TOPROL-XL) 50 MG 24 hr tablet, TAKE 1 TABLET (50 MG TOTAL) BY MOUTH DAILY. TAKE WITH OR IMMEDIATELY FOLLOWING A MEAL. .  nitroGLYCERIN (NITRODUR - DOSED IN MG/24 HR) 0.2 mg/hr patch, Place onto the skin.  .  rosuvastatin (CRESTOR) 20 MG tablet, TAKE 1 TABLET BY MOUTH EVERY DAY .  ezetimibe (ZETIA) 10 MG tablet, Take 1 tablet (10 mg total) by mouth daily.   Current Outpatient Medications (Analgesics):  .  aspirin EC 81 MG tablet, Take 1 tablet (81 mg total) by mouth 2 (two) times daily. For DVT prophylaxis for 30 days after surgery. .  celecoxib (CELEBREX) 200 MG capsule, TAKE 1 CAPSULE BY MOUTH DAILY AS NEEDED   Current Outpatient Medications (Other):  [name]  ALPRAZolam (XANAX) 0.25 MG tablet, TAKE 2 TABLETS BY MOUTH AT BEDTIME AS NEEDED FOR ANXIETY .  amoxicillin  (AMOXIL) 500 MG capsule, SMARTSIG:4 Capsule(s) By Mouth Once .  clobetasol cream (TEMOVATE) [id number] %, Apply 1 application topically at bedtime. After 2 weeks, decrease use to only 2-3 nights per week .  gabapentin (NEURONTIN) 100 MG capsule, Take 1 capsule (100 mg total) by mouth 2 (two) times daily. For pain. .  Misc Natural Products (TART CHERRY ADVANCED PO), Take 1 capsule by mouth daily.  .  Multiple Vitamins-Minerals (CENTRUM PO), Take 1 tablet by mouth daily. [name]  oxybutynin (DITROPAN) 5 MG tablet, Take 1 tablet (5 mg total) by mouth every  8 (eight) hours as needed for bladder spasms. .  potassium citrate (UROCIT-K) 10 MEQ (1080 MG) SR tablet, Take 10 mEq by mouth daily.   Reviewed prior external information including notes and imaging from  primary care provider As well as notes that were available from care everywhere and other healthcare systems.  Past medical history, social, surgical and family history all reviewed in electronic medical record.  No pertanent information unless stated regarding to the chief complaint.   Review of Systems:  No headache, visual changes, nausea, vomiting, diarrhea, constipation, dizziness, abdominal pain, skin rash, fevers, chills, night sweats, weight loss, swollen lymph nodes, body aches, joint swelling, chest pain, shortness of breath, mood changes. POSITIVE muscle aches  Objective  Blood pressure 140/90, pulse 79, height 5\' 6"  (1.676 m), weight 165 lb (74.8 kg), SpO2 100 %.   General: No apparent distress alert and oriented x3 mood and affect normal, dressed appropriately.  HEENT: Pupils equal, extraocular movements intact  Respiratory: Patient's speak in full sentences and does not appear short of breath  Cardiovascular: No lower extremity edema, non tender, no erythema  Antalgic gait noted Left heel has significant swelling with a Haglund nodule noted.  Patient though negative Thompson test.  The patient does have good strength at the foot and  ankle.  Patient is severely tender though on the posterior aspect of the calcaneal area.  Limited musculoskeletal ultrasound was performed and interpreted Lyndal Pulley  Limited ultrasound of patient's left Achilles show that patient does have signs that are consistent with a partial tear.  Patient has increasing in neovascularization within the substance of the tendon.  Significant surrounding hypoechoic changes as well.  Cortical irregularity of the calcaneal area that is consistent with chronic inflammation Impression: Severe chronic tendinitis of the Achilles with intersubstance tearing noted..     Impression and Recommendations:     The above documentation has been reviewed and is accurate and complete Lyndal Pulley, DO

## 2020-08-25 NOTE — Assessment & Plan Note (Addendum)
Patient does have what seems to be more of a partial tear of the left Achilles tendinitis.  With the amount of inflammation patient is at a significant increased risk of a rupture.  Patient would normally be nonweightbearing but this is difficult for patient patient states.  Unable to do cam walker secondary to patient's hip .  Quite severe.  On ultrasound there does seem to be significant intersubstance tearing noted.  We discussed with patient in great length about the anti-inflammatories, given heel lifts today, icing regimen, will get patient in with formal physical therapy we discussed with patient due to the longevity of this I would also consider patient to think about the possibility of an MRI.  Patient wants to try conservative therapy first.  Patient will follow up with me again in 3 to 4 weeks and we will see how patient is responding.

## 2020-08-25 NOTE — Patient Instructions (Addendum)
Good to see you Ankle brace and heel lifts you can do either or Try voltaren gel Ice 20 mins 2 times daily See me again in 3-4 weeks if worse we may need MRI

## 2020-08-25 NOTE — Assessment & Plan Note (Signed)
Patient's bone scan did show that patient does have some aseptic loosening of the joint following up with orthopedic surgery in the near future unable to wear cam walker on the foot secondary to it causing more discomfort with the hip

## 2020-09-03 ENCOUNTER — Ambulatory Visit: Payer: Medicare Other | Admitting: Family Medicine

## 2020-09-06 DIAGNOSIS — L570 Actinic keratosis: Secondary | ICD-10-CM | POA: Diagnosis not present

## 2020-09-06 DIAGNOSIS — D2261 Melanocytic nevi of right upper limb, including shoulder: Secondary | ICD-10-CM | POA: Diagnosis not present

## 2020-09-06 DIAGNOSIS — L814 Other melanin hyperpigmentation: Secondary | ICD-10-CM | POA: Diagnosis not present

## 2020-09-06 DIAGNOSIS — D225 Melanocytic nevi of trunk: Secondary | ICD-10-CM | POA: Diagnosis not present

## 2020-09-06 DIAGNOSIS — L281 Prurigo nodularis: Secondary | ICD-10-CM | POA: Diagnosis not present

## 2020-09-06 DIAGNOSIS — D2262 Melanocytic nevi of left upper limb, including shoulder: Secondary | ICD-10-CM | POA: Diagnosis not present

## 2020-09-06 DIAGNOSIS — L821 Other seborrheic keratosis: Secondary | ICD-10-CM | POA: Diagnosis not present

## 2020-09-10 ENCOUNTER — Encounter: Payer: Self-pay | Admitting: Family

## 2020-09-14 ENCOUNTER — Encounter: Payer: Self-pay | Admitting: Family Medicine

## 2020-09-16 ENCOUNTER — Ambulatory Visit: Payer: Medicare Other | Admitting: Family Medicine

## 2020-09-29 ENCOUNTER — Other Ambulatory Visit: Payer: Self-pay

## 2020-09-29 ENCOUNTER — Encounter: Payer: Self-pay | Admitting: Family Medicine

## 2020-09-29 ENCOUNTER — Ambulatory Visit: Payer: Medicare Other | Admitting: Family Medicine

## 2020-09-29 ENCOUNTER — Ambulatory Visit: Payer: Self-pay

## 2020-09-29 VITALS — BP 132/84 | HR 80 | Ht 66.0 in | Wt 167.0 lb

## 2020-09-29 DIAGNOSIS — M7662 Achilles tendinitis, left leg: Secondary | ICD-10-CM | POA: Diagnosis not present

## 2020-09-29 DIAGNOSIS — G8929 Other chronic pain: Secondary | ICD-10-CM | POA: Diagnosis not present

## 2020-09-29 DIAGNOSIS — M79672 Pain in left foot: Secondary | ICD-10-CM

## 2020-09-29 DIAGNOSIS — M25552 Pain in left hip: Secondary | ICD-10-CM

## 2020-09-29 DIAGNOSIS — Z96642 Presence of left artificial hip joint: Secondary | ICD-10-CM | POA: Diagnosis not present

## 2020-09-29 NOTE — Assessment & Plan Note (Signed)
Bone scan showed that there is potentially some aseptic loosening.  Seeing orthopedic surgeon tomorrow.

## 2020-09-29 NOTE — Patient Instructions (Signed)
PT [facility] Compression brace  [name] about hip See you in 4-6 weeks, if worsening pain consider MRI

## 2020-09-29 NOTE — Assessment & Plan Note (Signed)
Patient on ultrasound today does have tearing noted.  Intersubstance tearing.  Patient has done the nitroglycerin but did not make any improvement.  Unable to wear the cam walker secondary to the pain in the hip and the instability.  Patient was given a pneumatic compression sleeve today to see if this will be beneficial.  We will start with formal physical therapy.  If no significant improvement I do think MRI is necessary to evaluate the tear as well as the bone spur noted.  Patient will follow up with me again in 4 to 6 weeks

## 2020-09-29 NOTE — Progress Notes (Signed)
[facility] [place] Phone: [phone] Subjective:   [name], am serving as a scribe for Dr. Hulan Saas. This visit occurred during the SARS-CoV-2 public health emergency.  Safety protocols were in place, including screening questions prior to the visit, additional usage of staff PPE, and extensive cleaning of exam room while observing appropriate contact time as indicated for disinfecting solutions.   I'm seeing this patient by the request  of:  Marrian Salvage, FNP  CC: Left heel pain  [id number]   08/25/2020 Patient does have what seems to be more of a partial tear of the left Achilles tendinitis.  With the amount of inflammation patient is at a significant increased risk of a rupture.  Patient would normally be nonweightbearing but this is difficult for patient patient states.  Unable to do cam walker secondary to patient's hip .  Quite severe.  On ultrasound there does seem to be significant intersubstance tearing noted.  We discussed with patient in great length about the anti-inflammatories, given heel lifts today, icing regimen, will get patient in with formal physical therapy we discussed with patient due to the longevity of this I would also consider patient to think about the possibility of an MRI.  Patient wants to try conservative therapy first.  Patient will follow up with me again in 3 to 4 weeks and we will see how patient is responding.  Update 09/29/2020 [name] is a 75 y.o. female coming in with complaint of left achilles tendon pain. States that by the end of the day her achilles tendon. Has been wearing heel lifts which have helped. Slowly improving.  Patient would state 70% better.  Still though.  Has difficulty with walking long distances.  Patient states that it is very sore at the end of the day.  Still in the same area.  Meeting with Dr. Mayer Camel tomorrow to speak about her hip pain.   Patient did have a bone scan that did show that there was instability of the left hip noted.      Past Medical History:  Diagnosis Date  . Adenomatous colon polyp   . Arthritis   . Cataract   . Complication of anesthesia   . Heart murmur    MVP  . Hyperlipidemia   . Hypertension   . Hypothyroidism   . PONV (postoperative nausea and vomiting)   . Thyroid disease    Past Surgical History:  Procedure Laterality Date  . BACK SURGERY    . BREAST EXCISIONAL BIOPSY Right 1983  . COLONOSCOPY W/ POLYPECTOMY  2003,2006,2013   [name]  . DIAGNOSTIC LAPAROSCOPY  1979   exploratory to get pregnant  . DILATION AND CURETTAGE OF UTERUS     X2  . NEPHRECTOMY  2010   Left for 9.8cm benign complex cyst  . THYROIDECTOMY  2000   [name]  . TONSILLECTOMY    . TOTAL ABDOMINAL HYSTERECTOMY W/ BILATERAL SALPINGOOPHORECTOMY  2003   [name] for dysfunctional menses  . TOTAL HIP ARTHROPLASTY Left 06/24/2019   Procedure: TOTAL HIP ARTHROPLASTY ANTERIOR APPROACH;  Surgeon: Renette Butters, MD;  Location: [facility];  Service: Orthopedics;  Laterality: Left;   Social History   Socioeconomic History  . Marital status: Married    Spouse name: Not on file  . Number of children: 2  . Years of education: Not on file  . Highest education level: Not on file  Occupational History  .  Occupation: Pharmacist, community  Tobacco Use  . Smoking status: Former Smoker    Packs/day: 0.50    Years: 4.00    Pack years: 2.00    Quit date: 08/29/1975    Years since quitting: 45.1  . Smokeless tobacco: Never Used  . Tobacco comment: smoked 1973-1977, up to 1/2 ppd  Vaping Use  . Vaping Use: Never used  Substance and Sexual Activity  . Alcohol use: Yes    Alcohol/week: 0.0 standard drinks    Comment: minimally  1 every 6 months  . Drug use: Carter  . Sexual activity: Yes    Comment: HYST-1st intercourse 10 yo-5 partners  Other Topics Concern  . Not on file  Social History Narrative   Fun/Hobbies: Grandkids    Social Determinants of Health   Financial Resource Strain: Not on file  Food Insecurity: Not on file  Transportation Needs: Not on file  Physical Activity: Not on file  Stress: Not on file  Social Connections: Not on file   Allergies  Allergen Reactions  . Codeine Sulfate     "made me jumpy"  . Erythromycin Nausea Only   Family History  Problem Relation Age of Onset  . Transient ischemic attack Father        in 9s  . Coronary artery disease Father        stents @ 65  . Nephrolithiasis Father   . Hyperlipidemia Mother   . Transient ischemic attack Mother 12  . Thyroid disease Mother        hyperthyroid  . Urolithiasis Sister   . Diabetes Neg Hx     Current Outpatient Medications (Endocrine & Metabolic):  .  levothyroxine (SYNTHROID) 88 MCG tablet, Take 1 tablet (88 mcg total) by mouth daily.  Current Outpatient Medications (Cardiovascular):  .  amLODipine (NORVASC) 5 MG tablet, Take 1 tablet (5 mg total) by mouth daily. .  benazepril (LOTENSIN) 40 MG tablet, Take 1 tablet (40 mg total) by mouth daily. .  metoprolol succinate (TOPROL-XL) 50 MG 24 hr tablet, TAKE 1 TABLET (50 MG TOTAL) BY MOUTH DAILY. TAKE WITH OR IMMEDIATELY FOLLOWING A MEAL. .  nitroGLYCERIN (NITRODUR - DOSED IN MG/24 HR) 0.2 mg/hr patch, Place onto the skin.  .  rosuvastatin (CRESTOR) 20 MG tablet, TAKE 1 TABLET BY MOUTH EVERY DAY .  ezetimibe (ZETIA) 10 MG tablet, Take 1 tablet (10 mg total) by mouth daily.   Current Outpatient Medications (Analgesics):  .  aspirin EC 81 MG tablet, Take 1 tablet (81 mg total) by mouth 2 (two) times daily. For DVT prophylaxis for 30 days after surgery. .  celecoxib (CELEBREX) 200 MG capsule, TAKE 1 CAPSULE BY MOUTH DAILY AS NEEDED   Current Outpatient Medications (Other):  [name]  ALPRAZolam (XANAX) 0.25 MG tablet, TAKE 2 TABLETS BY MOUTH AT BEDTIME AS NEEDED FOR ANXIETY .  amoxicillin (AMOXIL) 500 MG capsule, SMARTSIG:4 Capsule(s) By Mouth Once .  clobetasol cream  (TEMOVATE) 0.27 %, Apply 1 application topically at bedtime. After 2 weeks, decrease use to only 2-3 nights per week .  gabapentin (NEURONTIN) 100 MG capsule, Take 1 capsule (100 mg total) by mouth 2 (two) times daily. For pain. .  Misc Natural Products (TART CHERRY ADVANCED PO), Take 1 capsule by mouth daily.  .  Multiple Vitamins-Minerals (CENTRUM PO), Take 1 tablet by mouth daily. [name]  oxybutynin (DITROPAN) 5 MG tablet, Take 1 tablet (5 mg total) by mouth every 8 (eight) hours as needed for bladder spasms. [name]  potassium citrate (UROCIT-K) 10 MEQ (1080 MG) SR tablet, Take 10 mEq by mouth daily.   Reviewed prior external information including notes and imaging from  primary care provider As well as notes that were available from care everywhere and other healthcare systems.  Past medical history, social, surgical and family history all reviewed in electronic medical record.  Carter pertanent information unless stated regarding to the chief complaint.   Review of Systems:  Carter headache, visual changes, nausea, vomiting, diarrhea, constipation, dizziness, abdominal pain, skin rash, fevers, chills, night sweats, weight loss, swollen lymph nodes, body aches, joint swelling, chest pain, shortness of breath, mood changes. POSITIVE muscle aches  Objective  Blood pressure 132/84, pulse 80, height 5\' 6"  (1.676 m), weight 167 lb (75.8 kg), SpO2 99 %.   General: Carter apparent distress alert and oriented x3 mood and affect normal, dressed appropriately.  HEENT: Pupils equal, extraocular movements intact  Respiratory: Patient's speak in full sentences and does not appear short of breath  Cardiovascular: Carter lower extremity edema, non tender, Carter erythema  Gait antalgic gait Left ankle shows that patient still has a very large Haglund nodule noted.  Patient is tender to palpation at the insertion of the Achilles.  Patient has good strength though of the calf.  Negative Thompson.  Significant decrease in erythema and  warmness from previous exam. Right side does have a Haglund nodule noted as well.  Limited musculoskeletal ultrasound was performed and interpreted by Lyndal Pulley  Limited ultrasound of patient's left Achilles shows significant decrease in hypoechoic changes but patient does now have intersubstance tearing that is being able to be visualized.  Carter significant increase in Doppler flow noted.  This seems to be approximately 2 cm from insertion.  Patient does have a prominent spur noted on the posterior calcaneal area but not near where the tear is. Impression: Intrasubstance tearing of the Achilles.   Impression and Recommendations:     The above documentation has been reviewed and is accurate and complete Lyndal Pulley, DO

## 2020-09-30 ENCOUNTER — Encounter: Payer: Self-pay | Admitting: Orthopedic Surgery

## 2020-09-30 DIAGNOSIS — Z96642 Presence of left artificial hip joint: Secondary | ICD-10-CM | POA: Diagnosis not present

## 2020-09-30 DIAGNOSIS — M25552 Pain in left hip: Secondary | ICD-10-CM | POA: Diagnosis not present

## 2020-09-30 DIAGNOSIS — M7662 Achilles tendinitis, left leg: Secondary | ICD-10-CM | POA: Diagnosis not present

## 2020-10-05 ENCOUNTER — Other Ambulatory Visit: Payer: Self-pay | Admitting: Family

## 2020-10-05 DIAGNOSIS — F32A Depression, unspecified: Secondary | ICD-10-CM

## 2020-10-05 DIAGNOSIS — F419 Anxiety disorder, unspecified: Secondary | ICD-10-CM

## 2020-10-11 DIAGNOSIS — M7662 Achilles tendinitis, left leg: Secondary | ICD-10-CM | POA: Diagnosis not present

## 2020-10-11 DIAGNOSIS — M25561 Pain in right knee: Secondary | ICD-10-CM | POA: Diagnosis not present

## 2020-10-11 DIAGNOSIS — M1711 Unilateral primary osteoarthritis, right knee: Secondary | ICD-10-CM | POA: Diagnosis not present

## 2020-10-17 ENCOUNTER — Other Ambulatory Visit: Payer: Self-pay | Admitting: Family

## 2020-10-18 DIAGNOSIS — M6281 Muscle weakness (generalized): Secondary | ICD-10-CM | POA: Diagnosis not present

## 2020-10-18 DIAGNOSIS — M79662 Pain in left lower leg: Secondary | ICD-10-CM | POA: Diagnosis not present

## 2020-10-18 DIAGNOSIS — R262 Difficulty in walking, not elsewhere classified: Secondary | ICD-10-CM | POA: Diagnosis not present

## 2020-10-22 DIAGNOSIS — R262 Difficulty in walking, not elsewhere classified: Secondary | ICD-10-CM | POA: Diagnosis not present

## 2020-10-22 DIAGNOSIS — M6281 Muscle weakness (generalized): Secondary | ICD-10-CM | POA: Diagnosis not present

## 2020-10-22 DIAGNOSIS — M79662 Pain in left lower leg: Secondary | ICD-10-CM | POA: Diagnosis not present

## 2020-10-25 DIAGNOSIS — M79662 Pain in left lower leg: Secondary | ICD-10-CM | POA: Diagnosis not present

## 2020-10-25 DIAGNOSIS — M6281 Muscle weakness (generalized): Secondary | ICD-10-CM | POA: Diagnosis not present

## 2020-10-25 DIAGNOSIS — R262 Difficulty in walking, not elsewhere classified: Secondary | ICD-10-CM | POA: Diagnosis not present

## 2020-11-04 ENCOUNTER — Ambulatory Visit: Payer: Medicare Other | Admitting: Family Medicine

## 2020-11-22 DIAGNOSIS — M1711 Unilateral primary osteoarthritis, right knee: Secondary | ICD-10-CM | POA: Diagnosis not present

## 2020-12-05 ENCOUNTER — Other Ambulatory Visit: Payer: Self-pay | Admitting: Family

## 2020-12-05 DIAGNOSIS — F32A Depression, unspecified: Secondary | ICD-10-CM

## 2020-12-09 ENCOUNTER — Other Ambulatory Visit: Payer: Self-pay | Admitting: Family

## 2020-12-13 ENCOUNTER — Other Ambulatory Visit: Payer: Self-pay | Admitting: Family

## 2020-12-13 DIAGNOSIS — I10 Essential (primary) hypertension: Secondary | ICD-10-CM

## 2020-12-14 ENCOUNTER — Other Ambulatory Visit: Payer: Self-pay | Admitting: Family

## 2020-12-16 ENCOUNTER — Other Ambulatory Visit: Payer: Self-pay | Admitting: Internal Medicine

## 2020-12-24 DIAGNOSIS — M1711 Unilateral primary osteoarthritis, right knee: Secondary | ICD-10-CM | POA: Diagnosis not present

## 2021-01-04 DIAGNOSIS — S335XXD Sprain of ligaments of lumbar spine, subsequent encounter: Secondary | ICD-10-CM | POA: Diagnosis not present

## 2021-01-08 ENCOUNTER — Other Ambulatory Visit: Payer: Self-pay | Admitting: Family

## 2021-01-08 DIAGNOSIS — E782 Mixed hyperlipidemia: Secondary | ICD-10-CM

## 2021-01-10 ENCOUNTER — Other Ambulatory Visit: Payer: Self-pay | Admitting: Internal Medicine

## 2021-01-11 ENCOUNTER — Encounter: Payer: Self-pay | Admitting: Family

## 2021-01-12 ENCOUNTER — Other Ambulatory Visit: Payer: Self-pay

## 2021-01-12 ENCOUNTER — Ambulatory Visit (INDEPENDENT_AMBULATORY_CARE_PROVIDER_SITE_OTHER): Payer: Medicare Other | Admitting: Family

## 2021-01-12 DIAGNOSIS — H6983 Other specified disorders of Eustachian tube, bilateral: Secondary | ICD-10-CM

## 2021-01-12 DIAGNOSIS — H6980 Other specified disorders of Eustachian tube, unspecified ear: Secondary | ICD-10-CM | POA: Insufficient documentation

## 2021-01-12 MED ORDER — LORATADINE 10 MG PO TABS: 10.0000 mg | ORAL_TABLET | Freq: Every day | ORAL | 11 refills | Status: DC

## 2021-01-12 MED ORDER — FLUTICASONE PROPIONATE 50 MCG/ACT NA SUSP: 2.0000 | Freq: Every day | NASAL | 6 refills | Status: DC

## 2021-01-12 NOTE — Progress Notes (Signed)
Subjective:   By signing my name below, I, Shehryar Baig, attest that this documentation has been prepared under the direction and in the presence of Debbrah Alar NP. 01/12/2021      Patient ID: [name], female    DOB: [birth year], 75 y.o.   MRN: [id number]  No chief complaint on file.   HPI Patient is in today for a office visit. She is feeling echoing when she talks in her right ears and diminished hearing. Recently took a trip to the mountains "and my ears wouldn't pop." Today her symptoms have slightly improved. She also complains that her balance feels off. She denies having any cold symptoms.  Allergies- She has mild environmental allergies. She is not currently on an antihistamine.    Past Medical History:  Diagnosis Date  . Adenomatous colon polyp   . Arthritis   . Cataract   . Complication of anesthesia   . Heart murmur    MVP  . Hyperlipidemia   . Hypertension   . Hypothyroidism   . PONV (postoperative nausea and vomiting)   . Thyroid disease     Past Surgical History:  Procedure Laterality Date  . BACK SURGERY    . BREAST EXCISIONAL BIOPSY Right 1983  . COLONOSCOPY W/ POLYPECTOMY  2003,2006,2013   [name]  . DIAGNOSTIC LAPAROSCOPY  1979   exploratory to get pregnant  . DILATION AND CURETTAGE OF UTERUS     X2  . NEPHRECTOMY  2010   Left for 9.8cm benign complex cyst  . THYROIDECTOMY  2000   [name]  . TONSILLECTOMY    . TOTAL ABDOMINAL HYSTERECTOMY W/ BILATERAL SALPINGOOPHORECTOMY  2003   [name] for dysfunctional menses  . TOTAL HIP ARTHROPLASTY Left 06/24/2019   Procedure: TOTAL HIP ARTHROPLASTY ANTERIOR APPROACH;  Surgeon: Renette Butters, MD;  Location: [facility];  Service: Orthopedics;  Laterality: Left;    Family History  Problem Relation Age of Onset  . Transient ischemic attack Father        in 10s  . Coronary artery disease Father        stents @ 28  . Nephrolithiasis Father   . Hyperlipidemia Mother   . Transient ischemic  attack Mother 60  . Thyroid disease Mother        hyperthyroid  . Urolithiasis Sister   . Diabetes Neg Hx     Social History   Socioeconomic History  . Marital status: Married    Spouse name: Not on file  . Number of children: 2  . Years of education: Not on file  . Highest education level: Not on file  Occupational History  . Occupation: Pharmacist, community  Tobacco Use  . Smoking status: Former Smoker    Packs/day: 0.50    Years: 4.00    Pack years: 2.00    Quit date: 08/29/1975    Years since quitting: 45.4  . Smokeless tobacco: Never Used  . Tobacco comment: smoked 1973-1977, up to 1/2 ppd  Vaping Use  . Vaping Use: Never used  Substance and Sexual Activity  . Alcohol use: Yes    Alcohol/week: 0.0 standard drinks    Comment: minimally  1 every 6 months  . Drug use: No  . Sexual activity: Yes    Comment: HYST-1st intercourse 79 yo-5 partners  Other Topics Concern  . Not on file  Social History Narrative   Fun/Hobbies: Grandkids   Social Determinants of Health   Financial Resource Strain: Not on file  Food Insecurity: Not on file  Transportation Needs: Not on file  Physical Activity: Not on file  Stress: Not on file  Social Connections: Not on file  Intimate Partner Violence: Not on file    Outpatient Medications Prior to Visit  Medication Sig Dispense Refill  . ALPRAZolam (XANAX) 0.25 MG tablet TAKE 2 TABLETS BY MOUTH AT BEDTIME AS NEEDED FOR ANXIETY 60 tablet 1  . amLODipine (NORVASC) 5 MG tablet TAKE 1 TABLET BY MOUTH EVERY DAY 90 tablet 1  . amoxicillin (AMOXIL) 500 MG capsule SMARTSIG:4 Capsule(s) By Mouth Once    . aspirin EC 81 MG tablet Take 1 tablet (81 mg total) by mouth 2 (two) times daily. For DVT prophylaxis for 30 days after surgery. 60 tablet 0  . benazepril (LOTENSIN) 40 MG tablet TAKE 1 TABLET BY MOUTH EVERY DAY 90 tablet 3  . celecoxib (CELEBREX) 200 MG capsule TAKE 1 CAPSULE BY MOUTH DAILY AS NEEDED 30 capsule 5  . clobetasol cream (TEMOVATE)  1.61 % Apply 1 application topically at bedtime. After 2 weeks, decrease use to only 2-3 nights per week 60 g 1  . ezetimibe (ZETIA) 10 MG tablet TAKE 1 TABLET BY MOUTH EVERY DAY 30 tablet 1  . gabapentin (NEURONTIN) 100 MG capsule Take 1 capsule (100 mg total) by mouth 2 (two) times daily. For pain. 180 capsule 3  . levothyroxine (SYNTHROID) 88 MCG tablet TAKE 1 TABLET BY MOUTH EVERY DAY 90 tablet 1  . metoprolol succinate (TOPROL-XL) 50 MG 24 hr tablet TAKE 1 TABLET (50 MG TOTAL) BY MOUTH DAILY. TAKE WITH OR IMMEDIATELY FOLLOWING A MEAL. 90 tablet 0  . Misc Natural Products (TART CHERRY ADVANCED PO) Take 1 capsule by mouth daily.     . Multiple Vitamins-Minerals (CENTRUM PO) Take 1 tablet by mouth daily.    . nitroGLYCERIN (NITRODUR - DOSED IN MG/24 HR) 0.2 mg/hr patch Place onto the skin.     [name] oxybutynin (DITROPAN) 5 MG tablet Take 1 tablet (5 mg total) by mouth every 8 (eight) hours as needed for bladder spasms. 60 tablet 0  . potassium citrate (UROCIT-K) 10 MEQ (1080 MG) SR tablet Take 10 mEq by mouth daily.    . rosuvastatin (CRESTOR) 20 MG tablet TAKE 1 TABLET BY MOUTH EVERY DAY 90 tablet 1   No facility-administered medications prior to visit.    Allergies  Allergen Reactions  . Codeine Sulfate     "made me jumpy"  . Erythromycin Nausea Only    Review of Systems  HENT: Positive for tinnitus (Right ear). Negative for congestion.        (+)Diminished hearing in right ear  Respiratory: Negative for cough.   Endo/Heme/Allergies: Positive for environmental allergies.       Objective:    Physical Exam Constitutional:      Appearance: Normal appearance.  HENT:     Head: Normocephalic and atraumatic.     Right Ear: External ear normal.     Left Ear: External ear normal.     Ears:     Comments: Left ear had clear fluid with bubbles behind TM. Right ear is normal. Eyes:     Extraocular Movements: Extraocular movements intact.     Pupils: Pupils are equal, round, and reactive  to light.  Pulmonary:     Effort: Pulmonary effort is normal.  Skin:    General: Skin is warm and dry.  Neurological:     Mental Status: She is alert and oriented to person, place, and  time.  Psychiatric:        Behavior: Behavior normal.     There were no vitals taken for this visit. Wt Readings from Last 3 Encounters:  09/29/20 167 lb (75.8 kg)  08/25/20 165 lb (74.8 kg)  06/15/20 160 lb (72.6 kg)    Diabetic Foot Exam - Simple   No data filed    Lab Results  Component Value Date   WBC 8.8 06/16/2019   HGB 14.1 06/16/2019   HCT 43.7 06/16/2019   PLT 300 06/16/2019   GLUCOSE 87 06/16/2019   CHOL 279 (H) 09/18/2019   TRIG (H) 09/18/2019    499.0 Triglyceride is over 400; calculations on Lipids are invalid.   HDL 44.00 09/18/2019   LDLDIRECT 118.0 09/18/2019   LDLCALC 90 07/11/2007   ALT 19 03/07/2019   AST 16 03/07/2019   NA 139 06/16/2019   K 4.4 06/16/2019   CL 102 06/16/2019   CREATININE 0.81 06/16/2019   BUN 17 06/16/2019   CO2 29 06/16/2019   TSH 1.47 04/28/2020   INR 1.0 04/21/2019   HGBA1C 5.8 02/21/2008    Lab Results  Component Value Date   TSH 1.47 04/28/2020   Lab Results  Component Value Date   WBC 8.8 06/16/2019   HGB 14.1 06/16/2019   HCT 43.7 06/16/2019   MCV 100.7 (H) 06/16/2019   PLT 300 06/16/2019   Lab Results  Component Value Date   NA 139 06/16/2019   K 4.4 06/16/2019   CO2 29 06/16/2019   GLUCOSE 87 06/16/2019   BUN 17 06/16/2019   CREATININE 0.81 06/16/2019   BILITOT 0.6 03/07/2019   ALKPHOS 98 03/07/2019   AST 16 03/07/2019   ALT 19 03/07/2019   PROT 7.4 03/07/2019   ALBUMIN 4.5 03/07/2019   CALCIUM 9.6 06/16/2019   ANIONGAP 8 06/16/2019   GFR 67.36 03/07/2019   Lab Results  Component Value Date   CHOL 279 (H) 09/18/2019   Lab Results  Component Value Date   HDL 44.00 09/18/2019   Lab Results  Component Value Date   LDLCALC 90 07/11/2007   Lab Results  Component Value Date   TRIG (H) 09/18/2019     499.0 Triglyceride is over 400; calculations on Lipids are invalid.   Lab Results  Component Value Date   CHOLHDL 6 09/18/2019   Lab Results  Component Value Date   HGBA1C 5.8 02/21/2008       Assessment & Plan:   Problem List Items Addressed This Visit   None      No orders of the defined types were placed in this encounter.   I, Debbrah Alar NP, personally preformed the services described in this documentation.  All medical record entries made by the scribe were at my direction and in my presence.  I have reviewed the chart and discharge instructions (if applicable) and agree that the record reflects my personal performance and is accurate and complete. 01/12/2021   I,Shehryar Baig,acting as a Education administrator for Nance Pear, NP.,have documented all relevant documentation on the behalf of Nance Pear, NP,as directed by  Nance Pear, NP while in the presence of Nance Pear, NP.   Shehryar [employer]

## 2021-01-12 NOTE — Assessment & Plan Note (Signed)
Will avoid sudafed due to HTN hx.  Recommended the following:  Please begin claritin 10mg  once daily and flonase 2 sprays each nostril once daily. Call if symptoms worsen or if now improved in 1 week and we will send you to see an ENT specialist.

## 2021-01-12 NOTE — Patient Instructions (Addendum)
Please begin claritin 10mg  once daily and flonase 2 sprays each nostril once daily. Call if symptoms worsen or if now improved in 1 week and we will send you to see an ENT specialist.    Eustachian Tube Dysfunction  Eustachian tube dysfunction refers to a condition in which a blockage develops in the narrow passage that connects the middle ear to the back of the nose (eustachian tube). The eustachian tube regulates air pressure in the middle ear by letting air move between the ear and nose. It also helps to drain fluid from the middle ear space. Eustachian tube dysfunction can affect one or both ears. When the eustachian tube does not function properly, air pressure, fluid, or both can build up in the middle ear. What are the causes? This condition occurs when the eustachian tube becomes blocked or cannot open normally. Common causes of this condition include:  Ear infections.  Colds and other infections that affect the nose, mouth, and throat (upper respiratory tract).  Allergies.  Irritation from cigarette smoke.  Irritation from stomach acid coming up into the esophagus (gastroesophageal reflux). The esophagus is the tube that carries food from the mouth to the stomach.  Sudden changes in air pressure, such as from descending in an airplane or scuba diving.  Abnormal growths in the nose or throat, such as: ? Growths that line the nose (nasal polyps). ? Abnormal growth of cells (tumors). ? Enlarged tissue at the back of the throat (adenoids). What increases the risk? You are more likely to develop this condition if:  You smoke.  You are overweight.  You are a child who has: ? Certain birth defects of the mouth, such as cleft palate. ? Large tonsils or adenoids. What are the signs or symptoms? Common symptoms of this condition include:  A feeling of fullness in the ear.  Ear pain.  Clicking or popping noises in the ear.  Ringing in the ear.  Hearing loss.  Loss of  balance.  Dizziness. Symptoms may get worse when the air pressure around you changes, such as when you travel to an area of high elevation, fly on an airplane, or go scuba diving. How is this diagnosed? This condition may be diagnosed based on:  Your symptoms.  A physical exam of your ears, nose, and throat.  Tests, such as those that measure: ? The movement of your eardrum (tympanogram). ? Your hearing (audiometry). How is this treated? Treatment depends on the cause and severity of your condition.  In mild cases, you may relieve your symptoms by moving air into your ears. This is called "popping the ears."  In more severe cases, or if you have symptoms of fluid in your ears, treatment may include: ? Medicines to relieve congestion (decongestants). ? Medicines that treat allergies (antihistamines). ? Nasal sprays or ear drops that contain medicines that reduce swelling (steroids). ? A procedure to drain the fluid in your eardrum (myringotomy). In this procedure, a small tube is placed in the eardrum to:  Drain the fluid.  Restore the air in the middle ear space. ? A procedure to insert a balloon device through the nose to inflate the opening of the eustachian tube (balloon dilation). Follow these instructions at home: Lifestyle  Do not do any of the following until your health care provider approves: ? Travel to high altitudes. ? Fly in airplanes. ? Work in a Pension scheme manager or room. ? Scuba dive.  Do not use any products that contain nicotine or  tobacco, such as cigarettes and e-cigarettes. If you need help quitting, ask your health care provider.  Keep your ears dry. Wear fitted earplugs during showering and bathing. Dry your ears completely after. General instructions  Take over-the-counter and prescription medicines only as told by your health care provider.  Use techniques to help pop your ears as recommended by your health care provider. These may  include: ? Chewing gum. ? Yawning. ? Frequent, forceful swallowing. ? Closing your mouth, holding your nose closed, and gently blowing as if you are trying to blow air out of your nose.  Keep all follow-up visits as told by your health care provider. This is important. Contact a health care provider if:  Your symptoms do not go away after treatment.  Your symptoms come back after treatment.  You are unable to pop your ears.  You have: ? A fever. ? Pain in your ear. ? Pain in your head or neck. ? Fluid draining from your ear.  Your hearing suddenly changes.  You become very dizzy.  You lose your balance. Summary  Eustachian tube dysfunction refers to a condition in which a blockage develops in the eustachian tube.  It can be caused by ear infections, allergies, inhaled irritants, or abnormal growths in the nose or throat.  Symptoms include ear pain, hearing loss, or ringing in the ears.  Mild cases are treated with maneuvers to unblock the ears, such as yawning or ear popping.  Severe cases are treated with medicines. Surgery may also be done (rare). This information is not intended to replace advice given to you by your health care provider. Make sure you discuss any questions you have with your health care provider. Document Revised: 12/04/2017 Document Reviewed: 12/04/2017 Elsevier Patient Education  [place].

## 2021-01-26 ENCOUNTER — Ambulatory Visit (INDEPENDENT_AMBULATORY_CARE_PROVIDER_SITE_OTHER): Payer: Medicare Other | Admitting: Family

## 2021-01-26 ENCOUNTER — Encounter: Payer: Self-pay | Admitting: Family

## 2021-01-26 ENCOUNTER — Other Ambulatory Visit: Payer: Self-pay

## 2021-01-26 VITALS — BP 150/80 | HR 98 | Temp 98.3°F | Ht 66.0 in | Wt 167.2 lb

## 2021-01-26 DIAGNOSIS — R202 Paresthesia of skin: Secondary | ICD-10-CM

## 2021-01-26 DIAGNOSIS — Z0000 Encounter for general adult medical examination without abnormal findings: Secondary | ICD-10-CM | POA: Diagnosis not present

## 2021-01-26 DIAGNOSIS — Z1211 Encounter for screening for malignant neoplasm of colon: Secondary | ICD-10-CM | POA: Diagnosis not present

## 2021-01-26 DIAGNOSIS — R200 Anesthesia of skin: Secondary | ICD-10-CM | POA: Diagnosis not present

## 2021-01-26 DIAGNOSIS — I10 Essential (primary) hypertension: Secondary | ICD-10-CM | POA: Diagnosis not present

## 2021-01-26 DIAGNOSIS — E039 Hypothyroidism, unspecified: Secondary | ICD-10-CM

## 2021-01-26 DIAGNOSIS — E782 Mixed hyperlipidemia: Secondary | ICD-10-CM | POA: Diagnosis not present

## 2021-01-26 DIAGNOSIS — R32 Unspecified urinary incontinence: Secondary | ICD-10-CM

## 2021-01-26 LAB — B12 AND FOLATE PANEL
Folate: >24.4 ng/mL (ref >5.9)
Vitamin B-12: >1550 pg/mL — ABNORMAL HIGH (ref 211–911)

## 2021-01-26 LAB — CBC WITH DIFFERENTIAL/PLATELET
Basophils Absolute: 0 10*3/uL (ref 0.0–0.1)
Basophils Relative: 0.6 % (ref 0.0–3.0)
Eosinophils Absolute: 0.3 10*3/uL (ref 0.0–0.7)
Eosinophils Relative: 4.7 % (ref 0.0–5.0)
HCT: 42.4 % (ref 36.0–46.0)
Hemoglobin: 14.4 g/dL (ref 12.0–15.0)
Lymphocytes Relative: 16.6 % (ref 12.0–46.0)
Lymphs Abs: 1.1 10*3/uL (ref 0.7–4.0)
MCHC: 33.9 g/dL (ref 30.0–36.0)
MCV: 95 fl (ref 78.0–100.0)
Monocytes Absolute: 0.7 10*3/uL (ref 0.1–1.0)
Monocytes Relative: 10.2 % (ref 3.0–12.0)
Neutro Abs: 4.6 10*3/uL (ref 1.4–7.7)
Neutrophils Relative %: 67.9 % (ref 43.0–77.0)
Platelets: 277 10*3/uL (ref 150.0–400.0)
RBC: 4.46 Mil/uL (ref 3.87–5.11)
RDW: 13.9 % (ref 11.5–15.5)
WBC: 6.7 10*3/uL (ref 4.0–10.5)

## 2021-01-26 LAB — TSH: TSH: 5.63 u[IU]/mL — ABNORMAL HIGH (ref 0.35–4.50)

## 2021-01-26 NOTE — Progress Notes (Signed)
Neg Bi-[facility] from [facility] from [place] on 01/02/20

## 2021-01-26 NOTE — Progress Notes (Signed)
[name] is a 75 y.o. female with the following history as recorded in EpicCare:  Patient Active Problem List   Diagnosis Date Noted  . Eustachian tube dysfunction 01/12/2021  . Left Achilles tendinitis 08/25/2020  . Chronic hip pain after total replacement of left hip joint 06/15/2020  . Primary localized osteoarthritis of hip 06/24/2019  . Primary osteoarthritis of left hip 06/02/2019  . S/P lumbar fusion 04/23/2019  . Lumbar spinal stenosis 07/16/2018  . Renal cyst 07/16/2018  . Left lumbar radiculopathy 06/24/2018  . Left hip pain 10/25/2017  . Greater trochanteric bursitis of left hip 10/03/2017  . Anxiety and depression 05/23/2017  . Hyperlipidemia 04/21/2016  . Routine general medical examination at a health care facility 01/05/2016  . Medicare annual wellness visit, subsequent 01/05/2016  . Allergic rhinitis 12/29/2015  . Vaginal atrophy 10/22/2013  . DJD (degenerative joint disease) of knee 10/18/2012  . NEPHRECTOMY, HX OF 05/28/2009  . Hypothyroidism, postsurgical 07/18/2007  . HYPERLIPIDEMIA 07/18/2007  . Essential hypertension 07/18/2007  . OSTEOPENIA 07/15/2007  . History of colonic polyps 07/15/2007    Current Outpatient Medications  Medication Sig Dispense Refill  . ALPRAZolam (XANAX) 0.25 MG tablet TAKE 2 TABLETS BY MOUTH AT BEDTIME AS NEEDED FOR ANXIETY 60 tablet 1  . amLODipine (NORVASC) 5 MG tablet TAKE 1 TABLET BY MOUTH EVERY DAY 90 tablet 1  . amoxicillin (AMOXIL) 500 MG capsule SMARTSIG:4 Capsule(s) By Mouth Once    . aspirin EC 81 MG tablet Take 1 tablet (81 mg total) by mouth 2 (two) times daily. For DVT prophylaxis for 30 days after surgery. 60 tablet 0  . benazepril (LOTENSIN) 40 MG tablet TAKE 1 TABLET BY MOUTH EVERY DAY 90 tablet 3  . celecoxib (CELEBREX) 200 MG capsule TAKE 1 CAPSULE BY MOUTH DAILY AS NEEDED 30 capsule 5  . clobetasol cream (TEMOVATE) 2.68 % Apply 1 application topically at bedtime. After 2 weeks, decrease use to only 2-3 nights  per week 60 g 1  . ezetimibe (ZETIA) 10 MG tablet TAKE 1 TABLET BY MOUTH EVERY DAY 30 tablet 1  . fluticasone (FLONASE) 50 MCG/ACT nasal spray Place 2 sprays into both nostrils daily. 16 g 6  . gabapentin (NEURONTIN) 100 MG capsule Take 1 capsule (100 mg total) by mouth 2 (two) times daily. For pain. 180 capsule 3  . levothyroxine (SYNTHROID) 88 MCG tablet TAKE 1 TABLET BY MOUTH EVERY DAY 90 tablet 1  . metoprolol succinate (TOPROL-XL) 50 MG 24 hr tablet TAKE 1 TABLET (50 MG TOTAL) BY MOUTH DAILY. TAKE WITH OR IMMEDIATELY FOLLOWING A MEAL. 90 tablet 0  . Misc Natural Products (TART CHERRY ADVANCED PO) Take 1 capsule by mouth daily.     . Multiple Vitamins-Minerals (CENTRUM PO) Take 1 tablet by mouth daily.    . potassium citrate (UROCIT-K) 10 MEQ (1080 MG) SR tablet Take 10 mEq by mouth daily.    . rosuvastatin (CRESTOR) 20 MG tablet TAKE 1 TABLET BY MOUTH EVERY DAY 90 tablet 1   No current facility-administered medications for this visit.    Allergies: Codeine sulfate and Erythromycin  Past Medical History:  Diagnosis Date  . Adenomatous colon polyp   . Arthritis   . Cataract   . Complication of anesthesia   . Heart murmur    MVP  . Hyperlipidemia   . Hypertension   . Hypothyroidism   . PONV (postoperative nausea and vomiting)   . Thyroid disease     Past Surgical History:  Procedure Laterality  Date  . BACK SURGERY    . BREAST EXCISIONAL BIOPSY Right 1983  . COLONOSCOPY W/ POLYPECTOMY  2003,2006,2013   [name]  . DIAGNOSTIC LAPAROSCOPY  1979   exploratory to get pregnant  . DILATION AND CURETTAGE OF UTERUS     X2  . NEPHRECTOMY  2010   Left for 9.8cm benign complex cyst  . THYROIDECTOMY  2000   [name]  . TONSILLECTOMY    . TOTAL ABDOMINAL HYSTERECTOMY W/ BILATERAL SALPINGOOPHORECTOMY  2003   [name] for dysfunctional menses  . TOTAL HIP ARTHROPLASTY Left 06/24/2019   Procedure: TOTAL HIP ARTHROPLASTY ANTERIOR APPROACH;  Surgeon: Renette Butters, MD;  Location: [facility];  Service: Orthopedics;  Laterality: Left;    Family History  Problem Relation Age of Onset  . Transient ischemic attack Father        in 18s  . Coronary artery disease Father        stents @ 51  . Nephrolithiasis Father   . Hyperlipidemia Mother   . Transient ischemic attack Mother 15  . Thyroid disease Mother        hyperthyroid  . Urolithiasis Sister   . Diabetes Neg Hx     Social History   Tobacco Use  . Smoking status: Former Smoker    Packs/day: 0.50    Years: 4.00    Pack years: 2.00    Quit date: 08/29/1975    Years since quitting: 45.4  . Smokeless tobacco: Never Used  . Tobacco comment: smoked 1973-1977, up to 1/2 ppd  Substance Use Topics  . Alcohol use: Yes    Alcohol/week: 0.0 standard drinks    Comment: minimally  1 every 6 months    Subjective:  Presents for yearly CPE; continuing to struggle with lower extremity pain and weakness- wonders if her cholesterol medication could be contributing;  Is working with Dr. Percell Miller for management of right knee/ low back/ left hip issues;   Review of Systems  Constitutional: Negative.   HENT: Negative.   Eyes: Negative.   Respiratory: Negative.   Cardiovascular: Negative.   Gastrointestinal: Negative.   Genitourinary: Negative.   Musculoskeletal: Positive for back pain and joint pain.  Skin: Negative.   Neurological: Negative.   Endo/Heme/Allergies: Negative.   Psychiatric/Behavioral: Negative.       Objective:  Vitals:   01/26/21 1037 01/26/21 1120  BP: (!) 160/70 (!) 150/80  Pulse: 98   Temp: 98.3 F (36.8 C)   TempSrc: Oral   SpO2: 98%   Weight: 167 lb 3.2 oz (75.8 kg)   Height: 5' 6"  (1.676 m)     General: Well developed, well nourished, in no acute distress  Skin : Warm and dry.  Head: Normocephalic and atraumatic  Eyes: Sclera and conjunctiva clear; pupils round and reactive to light; extraocular movements intact  Ears: External normal; canals clear; tympanic membranes normal  Oropharynx:  Pink, supple. No suspicious lesions  Neck: Supple without thyromegaly, adenopathy  Lungs: Respirations unlabored; clear to auscultation bilaterally without wheeze, rales, rhonchi  CVS exam: normal rate and regular rhythm.  Abdomen: Soft; nontender; nondistended; normoactive bowel sounds; no masses or hepatosplenomegaly  Musculoskeletal: No deformities; no active joint inflammation  Extremities: No edema, cyanosis, clubbing  Vessels: Symmetric bilaterally  Neurologic: Alert and oriented; speech intact; face symmetrical; moves all extremities well; CNII-XII intact without focal deficit  Assessment:  1. PE (physical exam), annual   2. Urinary incontinence, unspecified type   3. Encounter for screening colonoscopy  4. Hypothyroidism, unspecified type   5. HYPERLIPIDEMIA   6. Numbness and tingling   7. Essential hypertension     Plan:  Age appropriate preventive healthcare needs addressed; encouraged regular eye doctor and dental exams; encouraged regular exercise; will update labs and refills as needed today; follow-up to be determined; Refer to urogynecology for consult; Refer for colonoscopy; Update TSH today- will adjust medication accordingly; Hold Crestor x 2 weeks to see if her legs feel better- she will also call Dr. Debara Pickett for a follow up at the lipid clinic; Check B12, magnesium today; can increase Gabapentin to bid; ? Control- start checking regularly and send readings for review in the next 2 weeks;  This visit occurred during the SARS-CoV-2 public health emergency.  Safety protocols were in place, including screening questions prior to the visit, additional usage of staff PPE, and extensive cleaning of exam room while observing appropriate contact time as indicated for disinfecting solutions.     No follow-ups on file.  Orders Placed This Encounter  Procedures  . CBC with Differential/Platelet  . Comp Met (CMET)  . Lipid panel  . TSH  . B12 and Folate Panel  . Magnesium   . Ambulatory referral to Urogynecology    Referral Priority:   Routine    Referral Type:   Consultation    Referral Reason:   Specialty Services Required    Requested Specialty:   Urology    Number of Visits Requested:   1  . Ambulatory referral to Gastroenterology    Referral Priority:   Routine    Referral Type:   Consultation    Referral Reason:   Specialty Services Required    Number of Visits Requested:   1    Requested Prescriptions    No prescriptions requested or ordered in this encounter

## 2021-01-26 NOTE — Patient Instructions (Addendum)
Hold the Crestor for now and let me hear in a few weeks with your response to being off the medication;  Go ahead and schedule to see Dr. Debara Pickett in the next month or so to discuss treatment options for your cholesterol.   It is fine to increase the Gabapentin to twice a day for now. We can increase to 3x per day if needed.   Please start checking your blood pressure and include those numbers with your e-mail in a few weeks;

## 2021-01-28 DIAGNOSIS — S335XXD Sprain of ligaments of lumbar spine, subsequent encounter: Secondary | ICD-10-CM | POA: Diagnosis not present

## 2021-01-28 DIAGNOSIS — M25561 Pain in right knee: Secondary | ICD-10-CM | POA: Diagnosis not present

## 2021-01-28 LAB — COMPREHENSIVE METABOLIC PANEL
ALT: 25 U/L (ref 0–35)
AST: 24 U/L (ref 0–37)
Albumin: 4.6 g/dL (ref 3.5–5.2)
Alkaline Phosphatase: 102 U/L (ref 39–117)
BUN: 16 mg/dL (ref 6–23)
CO2: 23 mEq/L (ref 19–32)
Calcium: 9.5 mg/dL (ref 8.4–10.5)
Chloride: 106 mEq/L (ref 96–112)
Creatinine, Ser: 0.87 mg/dL (ref 0.40–1.20)
GFR: 65.36 mL/min (ref >60.00)
Glucose, Bld: 112 mg/dL — ABNORMAL HIGH (ref 70–99)
Potassium: 4.3 mEq/L (ref 3.5–5.1)
Sodium: 147 mEq/L — ABNORMAL HIGH (ref 135–145)
Total Bilirubin: 0.5 mg/dL (ref 0.2–1.2)
Total Protein: 7 g/dL (ref 6.0–8.3)

## 2021-01-28 LAB — MAGNESIUM: Magnesium: 2.2 mg/dL (ref 1.5–2.5)

## 2021-01-28 LAB — LIPID PANEL
Cholesterol: 212 mg/dL — ABNORMAL HIGH (ref 0–200)
HDL: 46.4 mg/dL (ref >39.00)
NonHDL: 166.03
Total CHOL/HDL Ratio: 5
Triglycerides: 393 mg/dL — ABNORMAL HIGH (ref 0.0–149.0)
VLDL: 78.6 mg/dL — ABNORMAL HIGH (ref 0.0–40.0)

## 2021-01-28 LAB — LDL CHOLESTEROL, DIRECT: Direct LDL: 89 mg/dL

## 2021-01-31 ENCOUNTER — Other Ambulatory Visit: Payer: Self-pay | Admitting: Family

## 2021-01-31 ENCOUNTER — Encounter: Payer: Self-pay | Admitting: Family

## 2021-01-31 MED ORDER — LEVOTHYROXINE SODIUM 100 MCG PO TABS: 100.0000 ug | ORAL_TABLET | Freq: Every day | ORAL | 0 refills | Status: DC

## 2021-02-03 ENCOUNTER — Other Ambulatory Visit: Payer: Self-pay | Admitting: Internal Medicine

## 2021-02-03 ENCOUNTER — Other Ambulatory Visit: Payer: Self-pay | Admitting: Family

## 2021-02-03 DIAGNOSIS — F419 Anxiety disorder, unspecified: Secondary | ICD-10-CM

## 2021-02-05 ENCOUNTER — Other Ambulatory Visit: Payer: Self-pay | Admitting: Family

## 2021-02-23 ENCOUNTER — Encounter: Payer: Self-pay | Admitting: Family

## 2021-02-24 ENCOUNTER — Other Ambulatory Visit: Payer: Self-pay | Admitting: Family

## 2021-02-24 ENCOUNTER — Other Ambulatory Visit (INDEPENDENT_AMBULATORY_CARE_PROVIDER_SITE_OTHER): Payer: Medicare Other

## 2021-02-24 DIAGNOSIS — E890 Postprocedural hypothyroidism: Secondary | ICD-10-CM | POA: Diagnosis not present

## 2021-02-24 LAB — BASIC METABOLIC PANEL
BUN: 20 mg/dL (ref 6–23)
CO2: 31 mEq/L (ref 19–32)
Calcium: 9.4 mg/dL (ref 8.4–10.5)
Chloride: 103 mEq/L (ref 96–112)
Creatinine, Ser: 1.03 mg/dL (ref 0.40–1.20)
GFR: 53.34 mL/min — ABNORMAL LOW (ref >60.00)
Glucose, Bld: 95 mg/dL (ref 70–99)
Potassium: 4.1 mEq/L (ref 3.5–5.1)
Sodium: 141 mEq/L (ref 135–145)

## 2021-02-25 LAB — THYROID PANEL WITH TSH
Free Thyroxine Index: 3.9 — ABNORMAL HIGH (ref 1.4–3.8)
T3 Uptake: 28 % (ref 22–35)
T4, Total: 14.1 ug/dL — ABNORMAL HIGH (ref 5.1–11.9)
TSH: 1.98 mIU/L (ref 0.40–4.50)

## 2021-02-25 NOTE — Progress Notes (Signed)
Patient notified of results through MyChart.

## 2021-03-01 ENCOUNTER — Other Ambulatory Visit: Payer: Self-pay | Admitting: Family

## 2021-03-01 ENCOUNTER — Encounter: Payer: Self-pay | Admitting: Family

## 2021-03-01 DIAGNOSIS — G4700 Insomnia, unspecified: Secondary | ICD-10-CM

## 2021-03-01 DIAGNOSIS — R400 Somnolence: Secondary | ICD-10-CM

## 2021-03-01 DIAGNOSIS — H9191 Unspecified hearing loss, right ear: Secondary | ICD-10-CM

## 2021-03-02 ENCOUNTER — Other Ambulatory Visit: Payer: Self-pay | Admitting: Internal Medicine

## 2021-03-02 ENCOUNTER — Telehealth: Payer: Self-pay | Admitting: *Deleted

## 2021-03-02 DIAGNOSIS — M25562 Pain in left knee: Secondary | ICD-10-CM | POA: Diagnosis not present

## 2021-03-02 NOTE — Telephone Encounter (Signed)
   [name] Pre-operative Risk Assessment    Patient Name: [name]  DOB: 05/03/1946  MRN: [id number]     Request for surgical clearance:  What type of surgery is being performed? Left total knee replacement   When is this surgery scheduled? tbd   What type of clearance is required (medical clearance vs. Pharmacy clearance to hold med vs. Both)? Medical   Are there any medications that need to be held prior to surgery and how long?Aspirin 61m   Practice name and name of physician performing surgery? [name] ;Dr TEdmonia Carter  What is the office phone number? [phone] x [phone]   7.   What is the office fax number? [phone] attn [name]  8.   Anesthesia type (None, local, MAC, general) ? Unknown    [name]7/01/2021, 5:59 PM  _________________________________________________________________   (provider comments below)

## 2021-03-03 NOTE — Telephone Encounter (Signed)
S/w the pt and she is agreeable to an appt for pre op clearance. Pt will see Almyra Deforest, [facility] 03/09/21 @ 10:45, ok per Isaac Laud to use time slot. I will forward notes to [facility] for upcoming appt. Will send FYI to surgeon's office pt has appt 03/09/21.

## 2021-03-03 NOTE — Telephone Encounter (Signed)
   Name: [name]  DOB: May 01, 1946  MRN: [id number]  Primary Cardiologist: Pixie Casino, MD  Chart reviewed as part of pre-operative protocol coverage. Because of [name]'s past medical history and time since last visit, she will require a follow-up visit in order to better assess preoperative cardiovascular risk.  She was last seen 10/2019, so over 1 year ago. Appears our office tried to reach out to her end of 2021 to schedule but were unsuccessful. She had only seen Dr. Debara Pickett for lipid clinic so needs a general cardiology appt with him or APP.  Pre-op covering staff: - Please schedule appointment and call patient to inform them. - Please contact requesting surgeon's office via preferred method (i.e, phone, fax) to inform them of need for appointment prior to surgery.  Regarding aspirin, will hold off on routing to MD since aspirin should be able to be held based on clinical information available - no hx of MI, PCI, stroke, etc listed in chart but this can be reviewed at time of follow-up.  Charlie Pitter, PA-C  03/03/2021, 9:47 AM

## 2021-03-04 ENCOUNTER — Encounter: Payer: Self-pay | Admitting: Family

## 2021-03-09 ENCOUNTER — Encounter: Payer: Self-pay | Admitting: Physician Assistant

## 2021-03-09 ENCOUNTER — Ambulatory Visit: Payer: Medicare Other | Admitting: Physician Assistant

## 2021-03-09 ENCOUNTER — Other Ambulatory Visit: Payer: Self-pay

## 2021-03-09 VITALS — BP 150/88 | HR 76 | Ht 66.0 in | Wt 165.7 lb

## 2021-03-09 DIAGNOSIS — E039 Hypothyroidism, unspecified: Secondary | ICD-10-CM

## 2021-03-09 DIAGNOSIS — E785 Hyperlipidemia, unspecified: Secondary | ICD-10-CM

## 2021-03-09 DIAGNOSIS — Z01818 Encounter for other preprocedural examination: Secondary | ICD-10-CM | POA: Diagnosis not present

## 2021-03-09 DIAGNOSIS — R0602 Shortness of breath: Secondary | ICD-10-CM

## 2021-03-09 DIAGNOSIS — I10 Essential (primary) hypertension: Secondary | ICD-10-CM

## 2021-03-09 NOTE — Progress Notes (Signed)
Cardiology Office Note:    Date:  03/11/2021   ID:  [name], DOB 1945-11-03, MRN [id number]  PCP:  Marrian Salvage, [facility] Cardiologist:  Pixie Casino, MD     Referring MD: Marrian Salvage,*   Chief Complaint  Patient presents with   Follow-up    Seen for Dr. Debara Pickett   Pre-op Exam    Pending knee surgery    History of Present Illness:    [name] is a 75 y.o. female with a hx of hypertension, hyperlipidemia, hypothyroidism and history of mitral valve prolapse.  Patient was last seen by Dr. Debara Pickett in March 2021.  Coronary calcium score obtained on 11/20/2019 showed a coronary calcium score of 61 which placed the patient in the 57th percentile for age and sex matched control, there was also aortic atherosclerosis as well.  Dr. Debara Pickett recommended LDL goal of less than 70 and started on PCSK9 inhibitor.  Her insurance has approved Repatha however patient wished to discuss with her PCP first before starting on this medication.  More recently, patient is scheduled to proceed with a left total knee replacement by Dr. Percell Miller.  Patient presents today for follow-up and preop clearance.  She denies any recent chest pain, functional ability is limited by knees issue.  EKG showed new T wave inversion in V2 and also chronic T wave inversion in V1.  I discussed the case with DOD Dr. Sallyanne Kuster who recommended proceed with a nuclear stress test since patient cannot achieve 4 METS of activity.  Blood pressure remained high on repeat manual blood pressure checked by myself.  Her blood pressure was 146/78 on recheck.  She says her systolic blood pressure is usually in the 130s at home.  If her systolic blood pressure is persistently greater than 135 mmHg, I would highly recommend that she increase amlodipine to 10 mg daily.  If so we can send a new prescription for her.  She had muscle ache with Zocor and Crestor.  At this point still only cluster medication she is  taking as Zetia.  Her LDL goal is less than 70.  She was approved for Repatha last year however never started on the medication due to the fear of hitting the donut hole near the end of the year.  I will discussed the case with our clinical pharmacist regarding different alternatives and give her a call this afternoon.  Addendum: I have discussed with our clinical pharmacist and Mrs. Beldin regarding alternatives such as Livalo, PCSK9 inhibitor versus other statins but 3 times a a week dosing rather than daily dosing, she is willing to try the [place] again.  We will initiate prior authorization with insurance.  Past Medical History:  Diagnosis Date   Adenomatous colon polyp    Arthritis    Cataract    Complication of anesthesia    Heart murmur    MVP   Hyperlipidemia    Hypertension    Hypothyroidism    PONV (postoperative nausea and vomiting)    Thyroid disease     Past Surgical History:  Procedure Laterality Date   BACK SURGERY     BREAST EXCISIONAL BIOPSY Right 1983   COLONOSCOPY W/ POLYPECTOMY  2003,2006,2013   [name]   DIAGNOSTIC LAPAROSCOPY  1979   exploratory to get pregnant   DILATION AND CURETTAGE OF UTERUS     X2   NEPHRECTOMY  2010   Left for 9.8cm benign complex cyst  THYROIDECTOMY  2000   [name]   TONSILLECTOMY     TOTAL ABDOMINAL HYSTERECTOMY W/ BILATERAL SALPINGOOPHORECTOMY  2003   [name] for dysfunctional menses   TOTAL HIP ARTHROPLASTY Left 06/24/2019   Procedure: TOTAL HIP ARTHROPLASTY ANTERIOR APPROACH;  Surgeon: Renette Butters, MD;  Location: [facility];  Service: Orthopedics;  Laterality: Left;    Current Medications: Current Meds  Medication Sig   ALPRAZolam (XANAX) 0.25 MG tablet TAKE 2 TABLETS BY MOUTH AT BEDTIME AS NEEDED FOR ANXIETY   amLODipine (NORVASC) 5 MG tablet TAKE 1 TABLET BY MOUTH EVERY DAY   amoxicillin (AMOXIL) 500 MG capsule SMARTSIG:4 Capsule(s) By Mouth Once   aspirin EC 81 MG tablet Take 1 tablet (81 mg total) by mouth 2  (two) times daily. For DVT prophylaxis for 30 days after surgery.   benazepril (LOTENSIN) 40 MG tablet TAKE 1 TABLET BY MOUTH EVERY DAY   celecoxib (CELEBREX) 200 MG capsule TAKE 1 CAPSULE BY MOUTH DAILY AS NEEDED   clobetasol cream (TEMOVATE) 6.30 % Apply 1 application topically at bedtime. After 2 weeks, decrease use to only 2-3 nights per week   ezetimibe (ZETIA) 10 MG tablet TAKE 1 TABLET BY MOUTH EVERY DAY   gabapentin (NEURONTIN) 100 MG capsule Take 1 capsule (100 mg total) by mouth 2 (two) times daily. For pain.   levothyroxine (SYNTHROID) 100 MCG tablet Take 1 tablet (100 mcg total) by mouth daily.   metoprolol succinate (TOPROL-XL) 50 MG 24 hr tablet TAKE 1 TABLET (50 MG TOTAL) BY MOUTH DAILY. TAKE WITH OR IMMEDIATELY FOLLOWING A MEAL.   Misc Natural Products (TART CHERRY ADVANCED PO) Take 1 capsule by mouth daily.    Multiple Vitamins-Minerals (CENTRUM PO) Take 1 tablet by mouth daily.   potassium citrate (UROCIT-K) 10 MEQ (1080 MG) SR tablet Take 10 mEq by mouth daily.   rosuvastatin (CRESTOR) 20 MG tablet TAKE 1 TABLET BY MOUTH EVERY DAY     Allergies:   Codeine sulfate and Erythromycin   Social History   Socioeconomic History   Marital status: Married    Spouse name: Not on file   Number of children: 2   Years of education: Not on file   Highest education level: Not on file  Occupational History   Occupation: [employer]  Tobacco Use   Smoking status: Former    Packs/day: 0.50    Years: 4.00    Pack years: 2.00    Types: Cigarettes    Quit date: 08/29/1975    Years since quitting: 45.5   Smokeless tobacco: Never   Tobacco comments:    smoked 1973-1977, up to 1/2 ppd  Vaping Use   Vaping Use: Never used  Substance and Sexual Activity   Alcohol use: Yes    Alcohol/week: 0.0 standard drinks    Comment: minimally  1 every 6 months   Drug use: No   Sexual activity: Yes    Comment: HYST-1st intercourse 50 yo-5 partners  Other Topics Concern   Not on file  Social  History Narrative   Fun/Hobbies: Grandkids   Social Determinants of Health   Financial Resource Strain: Not on file  Food Insecurity: Not on file  Transportation Needs: Not on file  Physical Activity: Not on file  Stress: Not on file  Social Connections: Not on file     Family History: The patient's family history includes Coronary artery disease in her father; Hyperlipidemia in her mother; Nephrolithiasis in her father; Thyroid disease in her mother; Transient ischemic  attack in her father; Transient ischemic attack (age of onset: 50) in her mother; Urolithiasis in her sister. There is no history of Diabetes.  ROS:   Please see the history of present illness.     All other systems reviewed and are negative.  EKGs/Labs/Other Studies Reviewed:    The following studies were reviewed today:  N/A  EKG:  EKG is ordered today.  The ekg ordered today demonstrates normal sinus rhythm with T wave inversion in V1 and V2.  Recent Labs: 01/26/2021: ALT 25; Hemoglobin 14.4; Magnesium 2.2; Platelets 277.0 02/24/2021: BUN 20; Creatinine, Ser 1.03; Potassium 4.1; Sodium 141; TSH 1.98  Recent Lipid Panel    Component Value Date/Time   CHOL 212 (H) 01/26/2021 1149   TRIG 393.0 (H) 01/26/2021 1149   TRIG 152 (H) 07/13/2006 1126   HDL 46.40 01/26/2021 1149   CHOLHDL 5 01/26/2021 1149   VLDL 78.6 (H) 01/26/2021 1149   LDLCALC 90 07/11/2007 1321   LDLDIRECT 89.0 01/26/2021 1149     Risk Assessment/Calculations:           Physical Exam:    VS:  BP (!) 150/88 (BP Location: Right Arm, Patient Position: Sitting, Cuff Size: Normal)   Pulse 76   Ht 5\' 6"  (1.676 m)   Wt 165 lb 11.2 oz (75.2 kg)   SpO2 97%   BMI 26.74 kg/m     Wt Readings from Last 3 Encounters:  03/09/21 165 lb 11.2 oz (75.2 kg)  01/26/21 167 lb 3.2 oz (75.8 kg)  01/12/21 165 lb 3.2 oz (74.9 kg)     GEN:  Well nourished, well developed in no acute distress HEENT: Normal NECK: No JVD; No carotid bruits LYMPHATICS:  No lymphadenopathy CARDIAC: RRR, no murmurs, rubs, gallops RESPIRATORY:  Clear to auscultation without rales, wheezing or rhonchi  ABDOMEN: Soft, non-tender, non-distended MUSCULOSKELETAL:  No edema; No deformity  SKIN: Warm and dry NEUROLOGIC:  Alert and oriented x 3 PSYCHIATRIC:  Normal affect   ASSESSMENT:    1. Preop examination   2. SOB (shortness of breath)   3. Essential hypertension   4. Hyperlipidemia LDL goal <70   5. Hypothyroidism, unspecified type    PLAN:    In order of problems listed above:  Preoperative clearance: Patient is unable to accomplish more than 4 METS of activity.  EKG showed T wave inversion in V1 and V2.  I discussed the case with the DOD Dr. Sallyanne Kuster would recommend to proceed with Myoview prior to full clearance.  Dyspnea: Related to knee issue on exertion.  Hypertension: Blood pressure elevated today.  However normally her blood pressure is well controlled   Hyperlipidemia: She was previously seen by Dr. Debara Pickett for cholesterol management, Dr. Debara Pickett recommended Repatha.  If she was approved by her insurance, however she never started on the medication.  She is intolerant of statins.  Hypothyroidism: On levothyroxine.   Shared Decision Making/Informed Consent The risks [chest pain, shortness of breath, cardiac arrhythmias, dizziness, blood pressure fluctuations, myocardial infarction, stroke/transient ischemic attack, nausea, vomiting, allergic reaction, radiation exposure, metallic taste sensation and life-threatening complications (estimated to be 1 in 10,000)], benefits (risk stratification, diagnosing coronary artery disease, treatment guidance) and alternatives of a nuclear stress test were discussed in detail with Ms. Bajaj and she agrees to proceed.    Medication Adjustments/Labs and Tests Ordered: Current medicines are reviewed at length with the patient today.  Concerns regarding medicines are outlined above.  Orders Placed This Encounter   Procedures  Cardiac Stress Test: Informed Consent Details: Physician/Practitioner Attestation; Transcribe to consent form and obtain patient signature   MYOCARDIAL PERFUSION IMAGING   EKG 12-Lead   No orders of the defined types were placed in this encounter.   Patient Instructions  Medication Instructions:  Your physician recommends that you continue on your current medications as directed. Please refer to the Current Medication list given to you today.  *If you need a refill on your cardiac medications before your next appointment, please call your pharmacy*  Lab Work: NONE ordered at this time of appointment   If you have labs (blood work) drawn today and your tests are completely normal, you will receive your results only by: [place] (if you have MyChart) OR A paper copy in the mail If you have any lab test that is abnormal or we need to change your treatment, we will call you to review the results.  Testing/Procedures: Your physician has requested that you have a lexiscan myoview. For further information please visit [email or web address]. Please follow instruction sheet, as given.  Please schedule for 2-3 weeks at [facility] office   Follow-Up: At [facility], you and your health needs are our priority.  As part of our continuing mission to provide you with exceptional heart care, we have created designated Provider Care Teams.  These Care Teams include your primary Cardiologist (physician) and Advanced Practice Providers (APPs -  Physician Assistants and Nurse Practitioners) who all work together to provide you with the care you need, when you need it.  Your next appointment:   Pending Cholesterol medications    The format for your next appointment:   In Person  Provider:     Other Instructions MONITOR Blood pressure (BP) at home. If you continually get a systolic (top number) blood pressure greater than 135 on  multiple checks you may INCREASE Amlodipine to  10 mg daily.    Hilbert Corrigan, [place]  03/11/2021 2:14 PM    [facility]

## 2021-03-09 NOTE — Patient Instructions (Signed)
Medication Instructions:  Your physician recommends that you continue on your current medications as directed. Please refer to the Current Medication list given to you today.  *If you need a refill on your cardiac medications before your next appointment, please call your pharmacy*  Lab Work: NONE ordered at this time of appointment   If you have labs (blood work) drawn today and your tests are completely normal, you will receive your results only by: [place] (if you have MyChart) OR A paper copy in the mail If you have any lab test that is abnormal or we need to change your treatment, we will call you to review the results.  Testing/Procedures: Your physician has requested that you have a lexiscan myoview. For further information please visit [email or web address]. Please follow instruction sheet, as given.  Please schedule for 2-3 weeks at [facility] office   Follow-Up: At [facility], you and your health needs are our priority.  As part of our continuing mission to provide you with exceptional heart care, we have created designated Provider Care Teams.  These Care Teams include your primary Cardiologist (physician) and Advanced Practice Providers (APPs -  Physician Assistants and Nurse Practitioners) who all work together to provide you with the care you need, when you need it.  Your next appointment:   Pending Cholesterol medications    The format for your next appointment:   In Person  Provider:     Other Instructions MONITOR Blood pressure (BP) at home. If you continually get a systolic (top number) blood pressure greater than 135 on  multiple checks you may INCREASE Amlodipine to 10 mg daily.

## 2021-03-10 DIAGNOSIS — H9311 Tinnitus, right ear: Secondary | ICD-10-CM | POA: Diagnosis not present

## 2021-03-10 DIAGNOSIS — H9121 Sudden idiopathic hearing loss, right ear: Secondary | ICD-10-CM | POA: Diagnosis not present

## 2021-03-10 DIAGNOSIS — H903 Sensorineural hearing loss, bilateral: Secondary | ICD-10-CM | POA: Diagnosis not present

## 2021-03-10 DIAGNOSIS — H9113 Presbycusis, bilateral: Secondary | ICD-10-CM | POA: Diagnosis not present

## 2021-03-11 ENCOUNTER — Encounter: Payer: Self-pay | Admitting: Physician Assistant

## 2021-03-15 ENCOUNTER — Other Ambulatory Visit: Payer: Self-pay | Admitting: Otolaryngology

## 2021-03-15 DIAGNOSIS — H9121 Sudden idiopathic hearing loss, right ear: Secondary | ICD-10-CM

## 2021-03-15 DIAGNOSIS — H9113 Presbycusis, bilateral: Secondary | ICD-10-CM

## 2021-03-15 DIAGNOSIS — H9311 Tinnitus, right ear: Secondary | ICD-10-CM

## 2021-03-16 ENCOUNTER — Telehealth: Payer: Self-pay | Admitting: Internal Medicine

## 2021-03-16 NOTE — Telephone Encounter (Signed)
Follow Up:    Patient would like for [name] to call her, concerning the message on My Chart please.

## 2021-03-16 NOTE — Telephone Encounter (Signed)
Spoke with patient regarding MyChart message as noted below - she reviewed message but has not responded yet b/c of lengthy message. She is statin-intolerant. She is currently taking zetia 10mg .   She is also losing hearing in her right ear - Dr. Constance Holster is getting a head MRI -- feels like ringing in her ear. Is on steroid for this.  She would like to proceed with trying to get approval for PCSK9i. Advised I will notify her once approved, will need to schedule her for a lipid follow up about 3-4 months after starting on therapy and check fasting labs prior

## 2021-03-16 NOTE — Telephone Encounter (Signed)
[place] PA sent me a note that you would like to try [place]. In reviewing your chart, I see that crestor is on your med list but his note said you are intolerant of statins. Are you still taking this medication? If not, what side effects did you have? Are you still taking zetia? I just need to know these few things for a prior authorization.   Thanks! Clarnce Flock., RN (Dr. Lysbeth Penner nurse)

## 2021-03-17 ENCOUNTER — Telehealth (HOSPITAL_COMMUNITY): Payer: Self-pay | Admitting: *Deleted

## 2021-03-17 NOTE — Telephone Encounter (Signed)
Patient given detailed instructions per Myocardial Perfusion Study Information Sheet for the test on 03/23/21 Patient notified to arrive 15 minutes early and that it is imperative to arrive on time for appointment to keep from having the test rescheduled.  If you need to cancel or reschedule your appointment, please call the office within 24 hours of your appointment. . Patient verbalized understanding. [name]   

## 2021-03-17 NOTE — Telephone Encounter (Signed)
PA for repatha submitted via CMM (Key: [id number])

## 2021-03-23 ENCOUNTER — Ambulatory Visit (HOSPITAL_COMMUNITY): Payer: Medicare Other | Attending: Physician Assistant

## 2021-03-23 ENCOUNTER — Other Ambulatory Visit: Payer: Self-pay

## 2021-03-23 DIAGNOSIS — R0602 Shortness of breath: Secondary | ICD-10-CM | POA: Insufficient documentation

## 2021-03-23 DIAGNOSIS — Z01818 Encounter for other preprocedural examination: Secondary | ICD-10-CM | POA: Diagnosis not present

## 2021-03-23 DIAGNOSIS — Z01810 Encounter for preprocedural cardiovascular examination: Secondary | ICD-10-CM

## 2021-03-23 LAB — MYOCARDIAL PERFUSION IMAGING
LV dias vol: 57 mL (ref 46–106)
LV sys vol: 23 mL
Peak HR: 91 {beats}/min
Rest HR: 75 {beats}/min
SDS: 3
SRS: 0
SSS: 3
TID: 1

## 2021-03-23 MED ORDER — TECHNETIUM TC 99M TETROFOSMIN IV KIT
9.7000 | PACK | Freq: Once | INTRAVENOUS | Status: AC | PRN
  Administered 2021-03-23: 9.7 via INTRAVENOUS
  Filled 2021-03-23: qty 10

## 2021-03-23 MED ORDER — REGADENOSON 0.4 MG/5ML IV SOLN
0.4000 mg | Freq: Once | INTRAVENOUS | Status: AC
  Administered 2021-03-23: 0.4 mg via INTRAVENOUS

## 2021-03-23 MED ORDER — TECHNETIUM TC 99M TETROFOSMIN IV KIT
31.9000 | PACK | Freq: Once | INTRAVENOUS | Status: AC | PRN
  Administered 2021-03-23: 31.9 via INTRAVENOUS
  Filled 2021-03-23: qty 32

## 2021-03-24 NOTE — Telephone Encounter (Signed)
    Patient Name: [name]  DOB: [birth year] MRN: [id number]  Primary Cardiologist: Pixie Casino, MD  Chart reviewed as part of pre-operative protocol coverage. Given past medical history and time since last visit, based on ACC/AHA guidelines, ROZIE RYKOWSKI underwent nuclear stress test on 03/23/2021 which came back low risk with normal EF, no evidence of prior infarction or ischemia.  She is stable from cardiac perspective to proceed with surgery.  If needed, she may hold aspirin for 5 to 7 days prior to the surgery and restart as soon as possible afterward.  The patient was advised that if she develops new symptoms prior to surgery to contact our office to arrange for a follow-up visit, and she verbalized understanding.  I will route this recommendation to the requesting party via Epic fax function and remove from pre-op pool.  Please call with questions.  [place], [place] 03/24/2021, 4:15 PM

## 2021-03-25 NOTE — Telephone Encounter (Signed)
Appeal letter composed, faxed to [phone]

## 2021-03-28 ENCOUNTER — Other Ambulatory Visit: Payer: Self-pay | Admitting: Family

## 2021-03-28 DIAGNOSIS — F32A Depression, unspecified: Secondary | ICD-10-CM

## 2021-03-28 DIAGNOSIS — F419 Anxiety disorder, unspecified: Secondary | ICD-10-CM

## 2021-03-29 ENCOUNTER — Other Ambulatory Visit: Payer: Self-pay | Admitting: Internal Medicine

## 2021-04-05 NOTE — Telephone Encounter (Signed)
Received letter from [place] that patient is approved for Repatha from 03/15/21 - 10/01/21

## 2021-04-08 ENCOUNTER — Ambulatory Visit (INDEPENDENT_AMBULATORY_CARE_PROVIDER_SITE_OTHER): Payer: Medicare Other | Admitting: Family

## 2021-04-08 ENCOUNTER — Other Ambulatory Visit: Payer: Self-pay

## 2021-04-08 ENCOUNTER — Encounter: Payer: Self-pay | Admitting: Family

## 2021-04-08 VITALS — BP 162/100 | HR 84 | Temp 97.9°F | Ht 66.0 in | Wt 169.6 lb

## 2021-04-08 DIAGNOSIS — M25562 Pain in left knee: Secondary | ICD-10-CM | POA: Diagnosis not present

## 2021-04-08 DIAGNOSIS — M25561 Pain in right knee: Secondary | ICD-10-CM | POA: Diagnosis not present

## 2021-04-08 DIAGNOSIS — H9319 Tinnitus, unspecified ear: Secondary | ICD-10-CM | POA: Diagnosis not present

## 2021-04-08 DIAGNOSIS — G8929 Other chronic pain: Secondary | ICD-10-CM

## 2021-04-08 DIAGNOSIS — R233 Spontaneous ecchymoses: Secondary | ICD-10-CM

## 2021-04-08 DIAGNOSIS — I10 Essential (primary) hypertension: Secondary | ICD-10-CM

## 2021-04-08 MED ORDER — AMLODIPINE BESYLATE 10 MG PO TABS: 10.0000 mg | ORAL_TABLET | Freq: Every day | ORAL | 1 refills | Status: DC

## 2021-04-08 MED ORDER — MUPIROCIN 2 % EX OINT
1.0000 | TOPICAL_OINTMENT | Freq: Two times a day (BID) | CUTANEOUS | 0 refills | Status: DC
Start: 2021-04-08 — End: 2021-09-30

## 2021-04-08 NOTE — Progress Notes (Signed)
[name] is a 75 y.o. female with the following history as recorded in EpicCare:  Patient Active Problem List   Diagnosis Date Noted   Eustachian tube dysfunction 01/12/2021   Left Achilles tendinitis 08/25/2020   Chronic hip pain after total replacement of left hip joint 06/15/2020   Primary localized osteoarthritis of hip 06/24/2019   Primary osteoarthritis of left hip 06/02/2019   S/P lumbar fusion 04/23/2019   Lumbar spinal stenosis 07/16/2018   Renal cyst 07/16/2018   Left lumbar radiculopathy 06/24/2018   Left hip pain 10/25/2017   Greater trochanteric bursitis of left hip 10/03/2017   Anxiety and depression 05/23/2017   Hyperlipidemia 04/21/2016   Routine general medical examination at a health care facility 01/05/2016   Medicare annual wellness visit, subsequent 01/05/2016   Allergic rhinitis 12/29/2015   Vaginal atrophy 10/22/2013   DJD (degenerative joint disease) of knee 10/18/2012   NEPHRECTOMY, HX OF 05/28/2009   Hypothyroidism, postsurgical 07/18/2007   HYPERLIPIDEMIA 07/18/2007   Essential hypertension 07/18/2007   OSTEOPENIA 07/15/2007   History of colonic polyps 07/15/2007    Current Outpatient Medications  Medication Sig Dispense Refill   ALPRAZolam (XANAX) 0.25 MG tablet TAKE 2 TABLETS BY MOUTH AT BEDTIME AS NEEDED FOR ANXIETY 60 tablet 1   amoxicillin (AMOXIL) 500 MG capsule SMARTSIG:4 Capsule(s) By Mouth Once     aspirin EC 81 MG tablet Take 1 tablet (81 mg total) by mouth 2 (two) times daily. For DVT prophylaxis for 30 days after surgery. 60 tablet 0   benazepril (LOTENSIN) 40 MG tablet TAKE 1 TABLET BY MOUTH EVERY DAY 90 tablet 3   celecoxib (CELEBREX) 200 MG capsule TAKE 1 CAPSULE BY MOUTH DAILY AS NEEDED (Patient taking differently: TAKE 1 CAPSULE BY MOUTH DAILY AS NEEDED (M,W,F)) 30 capsule 5   ezetimibe (ZETIA) 10 MG tablet TAKE 1 TABLET BY MOUTH EVERY DAY 30 tablet 0   gabapentin (NEURONTIN) 100 MG capsule Take 1 capsule (100 mg total) by mouth 2  (two) times daily. For pain. 180 capsule 3   levothyroxine (SYNTHROID) 100 MCG tablet Take 1 tablet (100 mcg total) by mouth daily. 90 tablet 0   metoprolol succinate (TOPROL-XL) 50 MG 24 hr tablet TAKE 1 TABLET (50 MG TOTAL) BY MOUTH DAILY. TAKE WITH OR IMMEDIATELY FOLLOWING A MEAL. 90 tablet 0   mupirocin ointment (BACTROBAN) 2 % Apply 1 application topically 2 (two) times daily. 22 g 0   amLODipine (NORVASC) 10 MG tablet Take 1 tablet (10 mg total) by mouth daily. 90 tablet 1   clobetasol cream (TEMOVATE) [id number] % Apply 1 application topically at bedtime. After 2 weeks, decrease use to only 2-3 nights per week (Patient not taking: Reported on 04/08/2021) 60 g 1   Misc Natural Products (TART CHERRY ADVANCED PO) Take 1 capsule by mouth daily.      Multiple Vitamins-Minerals (CENTRUM PO) Take 1 tablet by mouth daily.     potassium citrate (UROCIT-K) 10 MEQ (1080 MG) SR tablet Take 10 mEq by mouth daily.     No current facility-administered medications for this visit.    Allergies: Atorvastatin, Rosuvastatin, Simvastatin, Codeine sulfate, and Erythromycin  Past Medical History:  Diagnosis Date   Adenomatous colon polyp    Arthritis    Cataract    Complication of anesthesia    Heart murmur    MVP   Hyperlipidemia    Hypertension    Hypothyroidism    PONV (postoperative nausea and vomiting)    Thyroid disease  Past Surgical History:  Procedure Laterality Date   BACK SURGERY     BREAST EXCISIONAL BIOPSY Right 1983   COLONOSCOPY W/ POLYPECTOMY  2003,2006,2013   [name]   DIAGNOSTIC LAPAROSCOPY  1979   exploratory to get pregnant   DILATION AND CURETTAGE OF UTERUS     X2   NEPHRECTOMY  2010   Left for 9.8cm benign complex cyst   THYROIDECTOMY  2000   [name]   TONSILLECTOMY     TOTAL ABDOMINAL HYSTERECTOMY W/ BILATERAL SALPINGOOPHORECTOMY  2003   [name] for dysfunctional menses   TOTAL HIP ARTHROPLASTY Left 06/24/2019   Procedure: TOTAL HIP ARTHROPLASTY ANTERIOR APPROACH;   Surgeon: Renette Butters, MD;  Location: [facility];  Service: Orthopedics;  Laterality: Left;    Family History  Problem Relation Age of Onset   Transient ischemic attack Father        in 9s   Coronary artery disease Father        stents @ 1   Nephrolithiasis Father    Hyperlipidemia Mother    Transient ischemic attack Mother 98   Thyroid disease Mother        hyperthyroid   Urolithiasis Sister    Diabetes Neg Hx     Social History   Tobacco Use   Smoking status: Former    Packs/day: 0.50    Years: 4.00    Pack years: 2.00    Types: Cigarettes    Quit date: 08/29/1975    Years since quitting: 45.6   Smokeless tobacco: Never   Tobacco comments:    smoked 1973-1977, up to 1/2 ppd  Substance Use Topics   Alcohol use: Yes    Alcohol/week: 0.0 standard drinks    Comment: minimally  1 every 6 months    Subjective:   Has noticed some "red marks" on her lower legs recently; no pain; did just complete course of prednisone per ENT; Also wants to discuss her blood pressure- PA at cardiologist office recommended increasing Amlodipine to 10 mg; she wanted to discuss that recommendation;      Objective:  Vitals:   04/08/21 0838  BP: (!) 162/100  Pulse: 84  Temp: 97.9 F (36.6 C)  TempSrc: Oral  SpO2: 97%  Weight: 169 lb 9.6 oz (76.9 kg)  Height: '5\' 6"'$  (1.676 m)    General: Well developed, well nourished, in no acute distress  Skin : Warm and dry. Scattered petechiae noted on both lower extremities Head: Normocephalic and atraumatic  Lungs: Respirations unlabored; clear to auscultation bilaterally without wheeze, rales, rhonchi  CVS exam: normal rate and regular rhythm.  Neurologic: Alert and oriented; speech intact; face symmetrical; moves all extremities well; CNII-XII intact without focal deficit   Assessment:  1. Essential hypertension   2. Petechiae   3. Tinnitus, unspecified laterality   4. Chronic pain of both knees     Plan:  Increase Amlodipine to 10 mg  daily; Reassurance- per patient, areas have been improving; ? Due to recent use of prednisone; CBC was normal in June 2022; follow up if areas do not continue to improve; Keep planned appt for MRI next week; follow up with ENT as scheduled; Planning for left knee replacement later this year;  This visit occurred during the SARS-CoV-2 public health emergency.  Safety protocols were in place, including screening questions prior to the visit, additional usage of staff PPE, and extensive cleaning of exam room while observing appropriate contact time as indicated for disinfecting solutions.  No follow-ups on file.  No orders of the defined types were placed in this encounter.   Requested Prescriptions   Signed Prescriptions Disp Refills   amLODipine (NORVASC) 10 MG tablet 90 tablet 1    Sig: Take 1 tablet (10 mg total) by mouth daily.   mupirocin ointment (BACTROBAN) 2 % 22 g 0    Sig: Apply 1 application topically 2 (two) times daily.

## 2021-04-11 DIAGNOSIS — M25562 Pain in left knee: Secondary | ICD-10-CM | POA: Diagnosis not present

## 2021-04-12 ENCOUNTER — Ambulatory Visit
Admission: RE | Admit: 2021-04-12 | Discharge: 2021-04-12 | Disposition: A | Discharge status: Home / Self Care | Payer: Medicare Other | Source: Ambulatory Visit | Attending: Otolaryngology | Admitting: Otolaryngology

## 2021-04-12 DIAGNOSIS — G939 Disorder of brain, unspecified: Secondary | ICD-10-CM | POA: Diagnosis not present

## 2021-04-12 DIAGNOSIS — I6782 Cerebral ischemia: Secondary | ICD-10-CM | POA: Diagnosis not present

## 2021-04-12 DIAGNOSIS — H9311 Tinnitus, right ear: Secondary | ICD-10-CM | POA: Diagnosis not present

## 2021-04-12 DIAGNOSIS — H9113 Presbycusis, bilateral: Secondary | ICD-10-CM

## 2021-04-12 DIAGNOSIS — H9121 Sudden idiopathic hearing loss, right ear: Secondary | ICD-10-CM

## 2021-04-12 MED ORDER — GADOBENATE DIMEGLUMINE 529 MG/ML IV SOLN
15.0000 mL | Freq: Once | INTRAVENOUS | Status: AC | PRN
  Administered 2021-04-12: 15 mL via INTRAVENOUS

## 2021-04-14 DIAGNOSIS — H9121 Sudden idiopathic hearing loss, right ear: Secondary | ICD-10-CM | POA: Diagnosis not present

## 2021-04-14 DIAGNOSIS — D497 Neoplasm of unspecified behavior of endocrine glands and other parts of nervous system: Secondary | ICD-10-CM | POA: Diagnosis not present

## 2021-04-14 DIAGNOSIS — H903 Sensorineural hearing loss, bilateral: Secondary | ICD-10-CM | POA: Diagnosis not present

## 2021-04-26 DIAGNOSIS — E236 Other disorders of pituitary gland: Secondary | ICD-10-CM | POA: Diagnosis not present

## 2021-04-27 ENCOUNTER — Other Ambulatory Visit: Payer: Self-pay | Admitting: Internal Medicine

## 2021-04-28 DIAGNOSIS — G9389 Other specified disorders of brain: Secondary | ICD-10-CM | POA: Diagnosis not present

## 2021-04-28 DIAGNOSIS — E236 Other disorders of pituitary gland: Secondary | ICD-10-CM | POA: Diagnosis not present

## 2021-04-28 DIAGNOSIS — H9190 Unspecified hearing loss, unspecified ear: Secondary | ICD-10-CM | POA: Diagnosis not present

## 2021-05-03 DIAGNOSIS — I10 Essential (primary) hypertension: Secondary | ICD-10-CM | POA: Diagnosis not present

## 2021-05-03 DIAGNOSIS — M4316 Spondylolisthesis, lumbar region: Secondary | ICD-10-CM | POA: Diagnosis not present

## 2021-05-03 DIAGNOSIS — E236 Other disorders of pituitary gland: Secondary | ICD-10-CM | POA: Diagnosis not present

## 2021-05-04 ENCOUNTER — Telehealth: Payer: Self-pay

## 2021-05-05 ENCOUNTER — Other Ambulatory Visit: Payer: Self-pay

## 2021-05-05 DIAGNOSIS — I10 Essential (primary) hypertension: Secondary | ICD-10-CM

## 2021-05-05 MED ORDER — METOPROLOL SUCCINATE ER 50 MG PO TB24: 50.0000 mg | ORAL_TABLET | Freq: Every day | ORAL | 0 refills | Status: DC

## 2021-05-05 NOTE — Progress Notes (Signed)
[facility] New Patient Evaluation and Consultation  Referring Provider: Marrian Salvage,* PCP: Marrian Salvage, [place] Date of Service: 05/06/2021  SUBJECTIVE Chief Complaint: New Patient (Initial Visit)  History of Present Illness: [name] is a 75 y.o. White or Caucasian female seen in consultation at the request of [place] for evaluation of incontinence.    Review of records from [facility] significant for: History of urgency at night. Has used oxybutynin last year.  Urinary Symptoms: Leaks urine with with urgency- only at night Leaks 1 time(s) per day.  Pad use: 1 pads per day.   She is bothered by her UI symptoms.  Day time voids 5-6.  Nocturia: 4 times per night to void. Voiding dysfunction: she empties her bladder well.  does not use a catheter to empty bladder.  When urinating, she feels the need to urinate multiple times in a row Snores at night- has not done a sleep test.  Does not drink anything after dinner.  Tried oxybutynin '5mg'$  but that made her mouth too dry.   UTIs:  0  UTI's in the last year.   Reports history of kidney or bladder stones  Pelvic Organ Prolapse Symptoms:                  She Denies a feeling of a bulge the vaginal area.   Bowel Symptom: Bowel movements: 3 time(s) per day Stool consistency: hard or soft  Straining: yes.  Splinting: yes.  Incomplete evacuation: yes.  She Denies accidental bowel leakage / fecal incontinence Bowel regimen: none  Sexual Function Sexually active: no.    Pelvic Pain Denies pelvic pain  Past Medical History:  Past Medical History:  Diagnosis Date   Adenomatous colon polyp    Arthritis    Cataract    Complication of anesthesia    Heart murmur    MVP   Hyperlipidemia    Hypertension    Hypothyroidism    PONV (postoperative nausea and vomiting)    Thyroid disease      Past Surgical History:   Past Surgical History:  Procedure Laterality Date   BACK SURGERY      BREAST EXCISIONAL BIOPSY Right 1983   COLONOSCOPY W/ POLYPECTOMY  2003,2006,2013   [name]   DIAGNOSTIC LAPAROSCOPY  1979   exploratory to get pregnant   DILATION AND CURETTAGE OF UTERUS     X2   NEPHRECTOMY  2010   Left for 9.8cm benign complex cyst   THYROIDECTOMY  2000   [name]   TONSILLECTOMY     TOTAL ABDOMINAL HYSTERECTOMY W/ BILATERAL SALPINGOOPHORECTOMY  2003   [name] for dysfunctional menses   TOTAL HIP ARTHROPLASTY Left 06/24/2019   Procedure: TOTAL HIP ARTHROPLASTY ANTERIOR APPROACH;  Surgeon: Renette Butters, MD;  Location: [facility];  Service: Orthopedics;  Laterality: Left;     Past OB/GYN History: OB History  Gravida Para Term Preterm AB Living  '2 2 2     2  '$ SAB IAB Ectopic Multiple Live Births          2    # Outcome Date GA Lbr Len/2nd Weight Sex Delivery Anes PTL Lv  2 Term     M Vag-Spont  N LIV  1 Term     F Vag-Spont  N LIV    Vaginal deliveries: 2,  Forceps/ Vacuum deliveries: 0, Cesarean section: 0 S/p hysterectomy   Medications: She has a current medication list which includes the following prescription(s): alprazolam,  amlodipine, aspirin ec, benazepril, celecoxib, clobetasol cream, ezetimibe, gabapentin, levothyroxine, metoprolol succinate, mirabegron er, misc natural products, multiple vitamins-minerals, potassium citrate, mupirocin ointment, and [DISCONTINUED] simvastatin.   Allergies: Patient is allergic to atorvastatin, rosuvastatin, simvastatin, codeine sulfate, and erythromycin.   Social History:  Social History   Tobacco Use   Smoking status: Former    Packs/day: 0.50    Years: 4.00    Pack years: 2.00    Types: Cigarettes    Quit date: 08/29/1975    Years since quitting: 45.7   Smokeless tobacco: Never   Tobacco comments:    smoked 1973-1977, up to 1/2 ppd  Vaping Use   Vaping Use: Never used  Substance Use Topics   Alcohol use: Yes    Alcohol/week: 0.0 standard drinks    Comment: minimally  1 every 6 months   Drug use:  No    Family History:   Family History  Problem Relation Age of Onset   Transient ischemic attack Father        in 77s   Coronary artery disease Father        stents @ 51   Nephrolithiasis Father    Hyperlipidemia Mother    Transient ischemic attack Mother 71   Thyroid disease Mother        hyperthyroid   Urolithiasis Sister    Diabetes Neg Hx      Review of Systems: Review of Systems  Constitutional:  Negative for fever, malaise/fatigue and weight loss.  Respiratory:  Negative for cough, shortness of breath and wheezing.   Cardiovascular:  Negative for chest pain, palpitations and leg swelling.  Gastrointestinal:  Negative for abdominal pain and blood in stool.  Genitourinary:  Negative for dysuria.  Musculoskeletal:  Negative for myalgias.  Skin:  Negative for rash.  Neurological:  Negative for dizziness and headaches.  Endo/Heme/Allergies:  Does not bruise/bleed easily.  Psychiatric/Behavioral:  Negative for depression. The patient is not nervous/anxious.     OBJECTIVE Physical Exam: Vitals:   05/06/21 1607  BP: 136/85  Pulse: 93  Weight: 169 lb (76.7 kg)  Height: '5\' 6"'$  (1.676 m)    Physical Exam Constitutional:      General: She is not in acute distress. Pulmonary:     Effort: Pulmonary effort is normal.  Abdominal:     General: There is no distension.     Palpations: Abdomen is soft.     Tenderness: There is no abdominal tenderness. There is no rebound.  Musculoskeletal:        General: No swelling. Normal range of motion.  Skin:    General: Skin is warm and dry.     Findings: No rash.  Neurological:     Mental Status: She is alert and oriented to person, place, and time.  Psychiatric:        Mood and Affect: Mood normal.        Behavior: Behavior normal.     GU / Detailed Urogynecologic Evaluation:  Pelvic Exam: Lichen sclerosus noted on anterior labia minora and inferior introitus Bartholin's and Skene's glands normal in appearance; urethral  meatus normal in appearance, no urethral masses or discharge.   CST: negative  Speculum exam reveals normal vaginal mucosa without atrophy. Cervix normal appearance. Uterus normal single, nontender. Adnexa no mass, fullness, tenderness.    Pelvic floor strength I/V  Pelvic floor musculature: Right levator non-tender, Right obturator non-tender, Left levator non-tender, Left obturator non-tender  POP-Q:   POP-Q  -3  Aa   -3                                           Ba  -6.5                                              C   1                                            Gh  3                                            Pb  8                                            tvl   -2                                            Ap  -2                                            Bp  -8                                              D     Rectal Exam:  Normal external rectum  Post-Void Residual (PVR) by Bladder Scan: In order to evaluate bladder emptying, we discussed obtaining a postvoid residual and she agreed to this procedure.  Procedure: The ultrasound unit was placed on the patient's abdomen in the suprapubic region after the patient had voided. A PVR of 3 ml was obtained by bladder scan.  Laboratory Results: POC urine: trace blood   ASSESSMENT AND PLAN [name] is a 75 y.o. with:  1. Urinary frequency   2. Overactive bladder     - We discussed the symptoms of overactive bladder (OAB), which include urinary urgency, urinary frequency, nocturia, with or without urge incontinence.  While we do not know the exact etiology of OAB, several treatment options exist. We discussed management including behavioral therapy (decreasing bladder irritants, urge suppression strategies, timed voids, bladder retraining), physical therapy, medication; for refractory cases posterior tibial nerve stimulation, sacral neuromodulation, and intravesical  botulinum toxin injection.  For anticholinergic medications, we discussed the potential side effects of anticholinergics including dry eyes, dry mouth, constipation, cognitive impairment and urinary retention. For Beta-3 agonist medication, we discussed the potential side effect of elevated blood pressure which is more likely to occur in individuals with uncontrolled hypertension. - Prescribed Myrbetriq '25mg'$  daily.  - Recommended that she  get a sleep study since she snores and this can worsen nighttime urination if untreated. She will plan to do this after her upcoming surgery.   Return 6 weeks.   Jaquita Folds, MD   Medical Decision Making:  - Reviewed/ ordered a clinical laboratory test - Review and summation of prior records

## 2021-05-05 NOTE — Telephone Encounter (Signed)
Refill sent.

## 2021-05-06 ENCOUNTER — Encounter: Payer: Self-pay | Admitting: Obstetrics and Gynecology

## 2021-05-06 ENCOUNTER — Other Ambulatory Visit: Payer: Self-pay

## 2021-05-06 ENCOUNTER — Ambulatory Visit (INDEPENDENT_AMBULATORY_CARE_PROVIDER_SITE_OTHER): Payer: Medicare Other | Admitting: Obstetrics and Gynecology

## 2021-05-06 VITALS — BP 136/85 | HR 93 | Ht 66.0 in | Wt 169.0 lb

## 2021-05-06 DIAGNOSIS — R350 Frequency of micturition: Secondary | ICD-10-CM | POA: Diagnosis not present

## 2021-05-06 DIAGNOSIS — N3281 Overactive bladder: Secondary | ICD-10-CM

## 2021-05-06 LAB — POCT URINALYSIS DIPSTICK
Appearance: NORMAL
Bilirubin, UA: NEGATIVE
Glucose, UA: NEGATIVE
Ketones, UA: NEGATIVE
Leukocytes, UA: NEGATIVE
Nitrite, UA: NEGATIVE
Protein, UA: NEGATIVE
Spec Grav, UA: >=1.03 — AB (ref 1.010–1.025)
Urobilinogen, UA: 1 E.U./dL
pH, UA: 6.5 (ref 5.0–8.0)

## 2021-05-06 MED ORDER — MIRABEGRON ER 25 MG PO TB24: 25.0000 mg | ORAL_TABLET | Freq: Every day | ORAL | 5 refills | Status: DC

## 2021-05-06 NOTE — Patient Instructions (Signed)
We discussed the symptoms of overactive bladder (OAB), which include urinary urgency, urinary frequency, night-time urination, with or without urge incontinence.  We discussed management including behavioral therapy (decreasing bladder irritants by following a bladder diet, urge suppression strategies, timed voids, bladder retraining), physical therapy, medication.  For Beta-3 agonist medication, we discussed the potential side effect of elevated blood pressure which is more likely to occur in individuals with uncontrolled hypertension. You wereprescribed Myrbetriq 25 mg.  It can take a month to start working so give it time, but if you have bothersome side effects call sooner and we can try a different medication.  Call us if you have trouble filling the prescription or if it's not covered by your insurance.  Also recommend sleep study to evaluate for sleep apnea.

## 2021-05-09 DIAGNOSIS — Z01812 Encounter for preprocedural laboratory examination: Secondary | ICD-10-CM | POA: Diagnosis not present

## 2021-05-09 DIAGNOSIS — R791 Abnormal coagulation profile: Secondary | ICD-10-CM | POA: Diagnosis not present

## 2021-05-09 DIAGNOSIS — M1712 Unilateral primary osteoarthritis, left knee: Secondary | ICD-10-CM | POA: Diagnosis not present

## 2021-05-09 DIAGNOSIS — M25562 Pain in left knee: Secondary | ICD-10-CM | POA: Diagnosis not present

## 2021-05-12 ENCOUNTER — Telehealth: Payer: Self-pay | Admitting: Family

## 2021-05-12 MED ORDER — LEVOTHYROXINE SODIUM 100 MCG PO TABS: 100.0000 ug | ORAL_TABLET | Freq: Every day | ORAL | 1 refills | Status: DC

## 2021-05-12 NOTE — Telephone Encounter (Signed)
Medication:  levothyroxine (SYNTHROID) 100 MCG tablet  Has the patient contacted their pharmacy? Yes.   (If no, request that the patient contact the pharmacy for the refill.) (If yes, when and what did the pharmacy advise?) Contact office  Preferred Pharmacy (with phone number or street name): [id number] [place] Phone:  [phone]   Agent: Please be advised that RX refills may take up to 3 business days. We ask that you follow-up with your pharmacy.

## 2021-05-12 NOTE — Telephone Encounter (Signed)
Patient notified that rx has been sent in.    She wanted [name] to know that she is finally going to have her knee replacement on 05/26/21.

## 2021-05-17 DIAGNOSIS — R262 Difficulty in walking, not elsewhere classified: Secondary | ICD-10-CM | POA: Diagnosis not present

## 2021-05-17 DIAGNOSIS — M6281 Muscle weakness (generalized): Secondary | ICD-10-CM | POA: Diagnosis not present

## 2021-05-17 DIAGNOSIS — M1712 Unilateral primary osteoarthritis, left knee: Secondary | ICD-10-CM | POA: Diagnosis not present

## 2021-05-26 ENCOUNTER — Other Ambulatory Visit: Payer: Self-pay | Admitting: Internal Medicine

## 2021-05-26 DIAGNOSIS — M1712 Unilateral primary osteoarthritis, left knee: Secondary | ICD-10-CM | POA: Diagnosis not present

## 2021-05-26 DIAGNOSIS — G8918 Other acute postprocedural pain: Secondary | ICD-10-CM | POA: Diagnosis not present

## 2021-05-26 DIAGNOSIS — M25762 Osteophyte, left knee: Secondary | ICD-10-CM | POA: Diagnosis not present

## 2021-05-28 DIAGNOSIS — M1712 Unilateral primary osteoarthritis, left knee: Secondary | ICD-10-CM | POA: Diagnosis not present

## 2021-05-28 DIAGNOSIS — Z96652 Presence of left artificial knee joint: Secondary | ICD-10-CM | POA: Diagnosis not present

## 2021-05-31 ENCOUNTER — Other Ambulatory Visit: Payer: Self-pay | Admitting: Family

## 2021-05-31 DIAGNOSIS — F419 Anxiety disorder, unspecified: Secondary | ICD-10-CM

## 2021-06-01 DIAGNOSIS — R531 Weakness: Secondary | ICD-10-CM | POA: Diagnosis not present

## 2021-06-01 DIAGNOSIS — R269 Unspecified abnormalities of gait and mobility: Secondary | ICD-10-CM | POA: Diagnosis not present

## 2021-06-01 DIAGNOSIS — Z96652 Presence of left artificial knee joint: Secondary | ICD-10-CM | POA: Diagnosis not present

## 2021-06-01 NOTE — Telephone Encounter (Signed)
Requesting: alprazolam 0.25mg  Contract: None UDS: None Last Visit: 04/08/2021  Next Visit: None Last Refill: 03/29/2021 #60 and 1RF  Please Advise

## 2021-06-08 DIAGNOSIS — Z96652 Presence of left artificial knee joint: Secondary | ICD-10-CM | POA: Diagnosis not present

## 2021-06-13 DIAGNOSIS — R269 Unspecified abnormalities of gait and mobility: Secondary | ICD-10-CM | POA: Diagnosis not present

## 2021-06-13 DIAGNOSIS — R531 Weakness: Secondary | ICD-10-CM | POA: Diagnosis not present

## 2021-06-20 DIAGNOSIS — R269 Unspecified abnormalities of gait and mobility: Secondary | ICD-10-CM | POA: Diagnosis not present

## 2021-06-20 DIAGNOSIS — R531 Weakness: Secondary | ICD-10-CM | POA: Diagnosis not present

## 2021-06-20 DIAGNOSIS — Z96652 Presence of left artificial knee joint: Secondary | ICD-10-CM | POA: Diagnosis not present

## 2021-06-27 DIAGNOSIS — R531 Weakness: Secondary | ICD-10-CM | POA: Diagnosis not present

## 2021-06-27 DIAGNOSIS — Z96652 Presence of left artificial knee joint: Secondary | ICD-10-CM | POA: Diagnosis not present

## 2021-06-27 DIAGNOSIS — R269 Unspecified abnormalities of gait and mobility: Secondary | ICD-10-CM | POA: Diagnosis not present

## 2021-06-28 ENCOUNTER — Ambulatory Visit: Payer: Medicare Other | Admitting: Obstetrics and Gynecology

## 2021-07-04 ENCOUNTER — Other Ambulatory Visit: Payer: Self-pay | Admitting: Family

## 2021-07-04 DIAGNOSIS — I10 Essential (primary) hypertension: Secondary | ICD-10-CM

## 2021-07-04 MED ORDER — METOPROLOL SUCCINATE ER 50 MG PO TB24: 50.0000 mg | ORAL_TABLET | Freq: Every day | ORAL | 3 refills | Status: DC

## 2021-07-05 DIAGNOSIS — R531 Weakness: Secondary | ICD-10-CM | POA: Diagnosis not present

## 2021-07-05 DIAGNOSIS — Z96652 Presence of left artificial knee joint: Secondary | ICD-10-CM | POA: Diagnosis not present

## 2021-07-05 DIAGNOSIS — R269 Unspecified abnormalities of gait and mobility: Secondary | ICD-10-CM | POA: Diagnosis not present

## 2021-07-06 DIAGNOSIS — M1712 Unilateral primary osteoarthritis, left knee: Secondary | ICD-10-CM | POA: Diagnosis not present

## 2021-07-18 IMAGING — CT CT CARDIAC CORONARY ARTERY CALCIUM SCORE
3 series · 14 of 20 positions shown, 15 images · non-contrast
Comparison: None.
COMPARISON: None.

Addendum:
EXAM:
OVER-READ INTERPRETATION  CT CHEST

The following report is an over-read performed by radiologist Dr.
Deslander Marrom [REDACTED] on 11/20/2019. This
over-read does not include interpretation of cardiac or coronary
anatomy or pathology. The coronary calcium score interpretation by
the cardiologist is attached.
CLINICAL DATA: Risk stratification
Coronary Calcium Score
TECHNIQUE: The patient was scanned on a Siemens Force scanner. Axial
non-contrast 3 mm slices were carried out through the heart. The
data set was analyzed on a dedicated work station and scored using
the Agatson method.

[Series 2: casc 3.0 bv41 2 bestdiast 70 % · axial · 0.36mm/px · z∈[-210,-138]mm · 4 of 41 slices shown, 5 images]
[im 9/41  vessel]
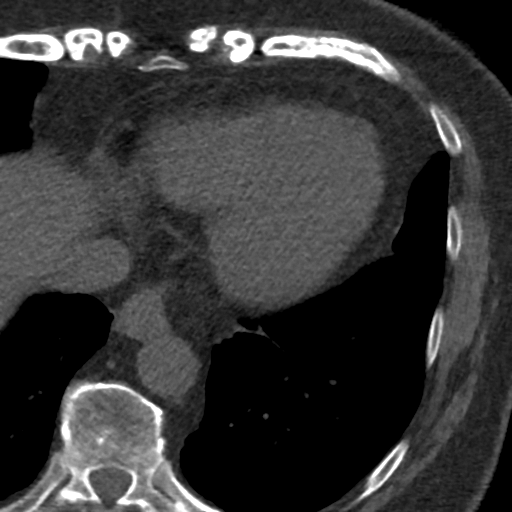
[im 9/41  lung]
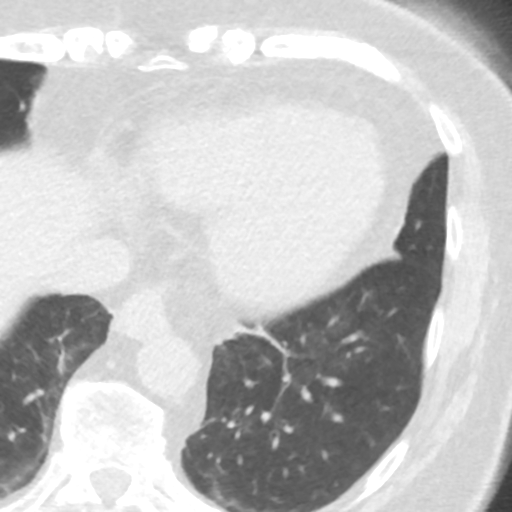
[im 17/41  vessel]
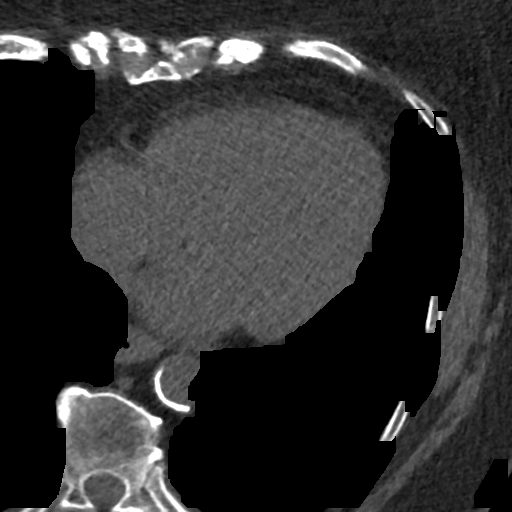
[im 25/41  vessel]
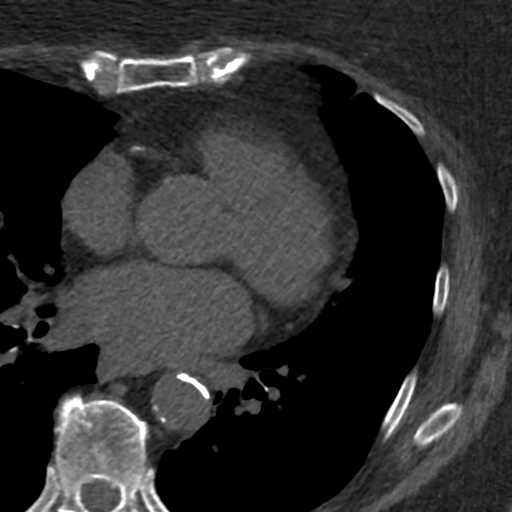
[im 33/41  vessel]
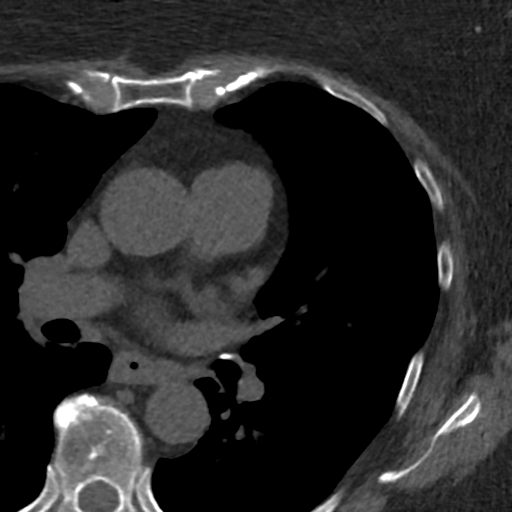

[Series 3: lung 71 % · axial · 0.68mm/px · z∈[-216,-134]mm · 5 of 41 slices shown]
[im 7/41  lung]
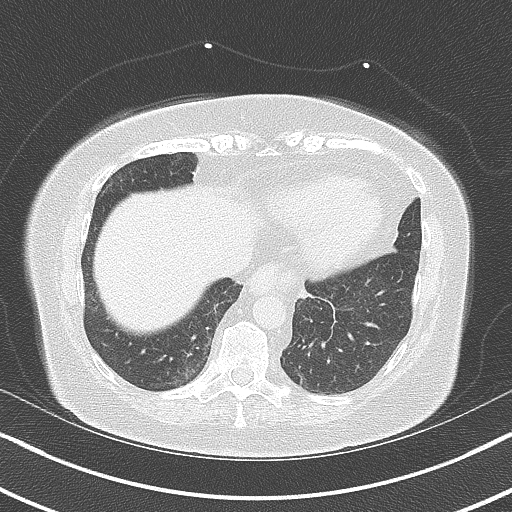
[im 14/41  lung]
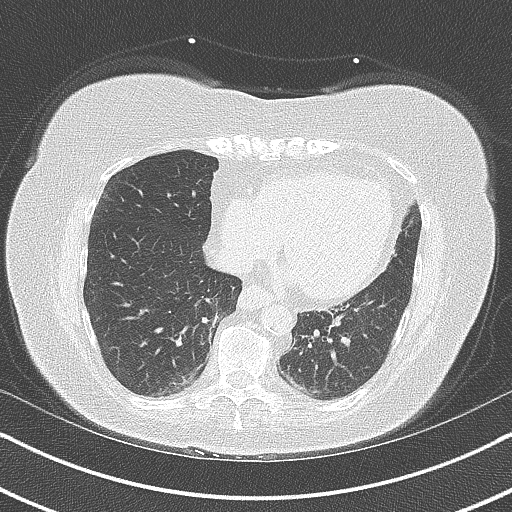
[im 21/41  lung]
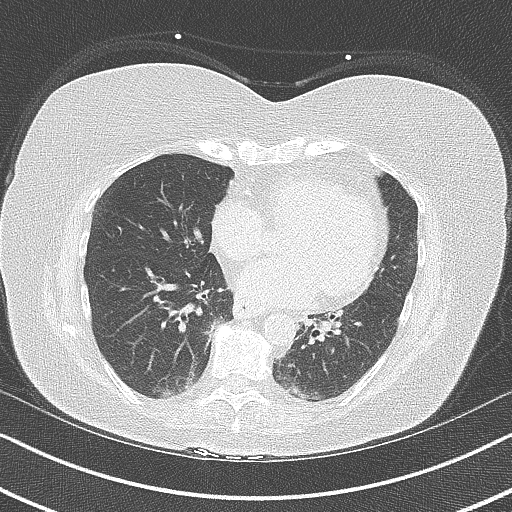
[im 27/41  lung]
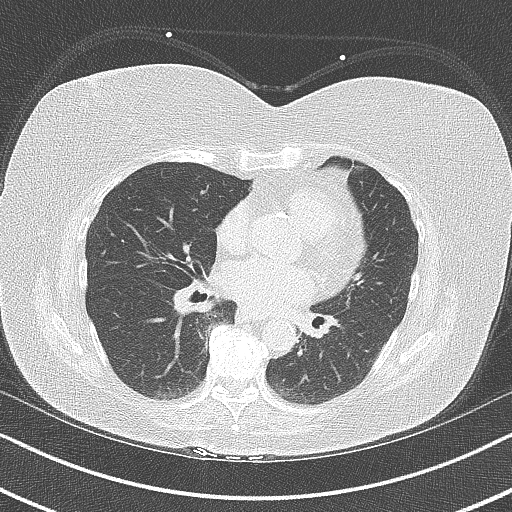
[im 34/41  lung]
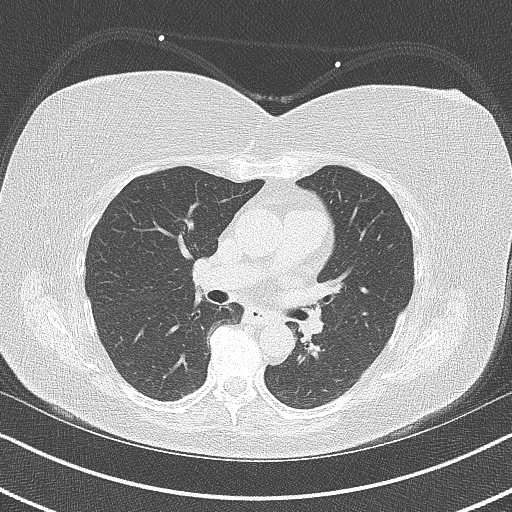

[Series 4: lung st 71 % · axial · 0.68mm/px · z∈[-216,-134]mm · 5 of 41 slices shown]
[im 7/41  lung]
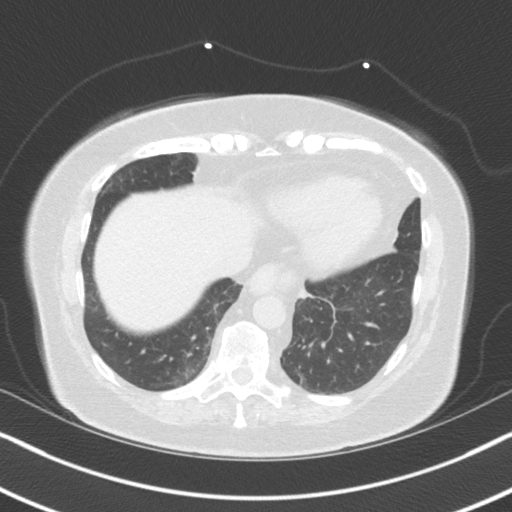
[im 14/41  lung]
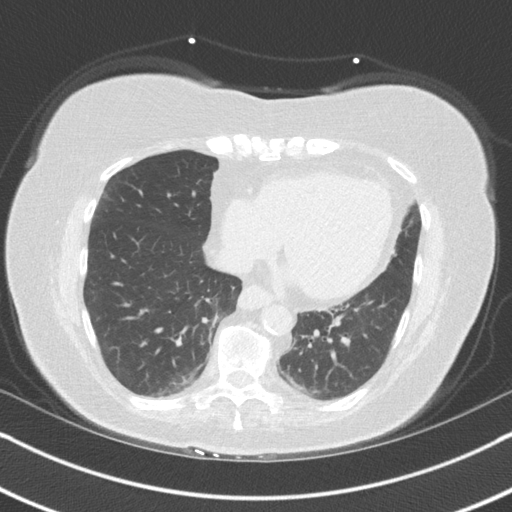
[im 21/41  lung]
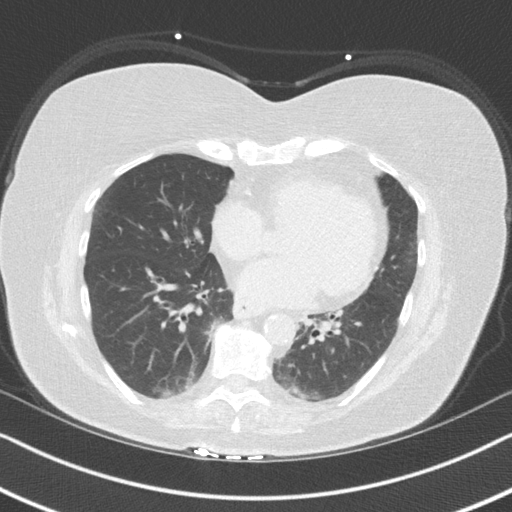
[im 27/41  lung]
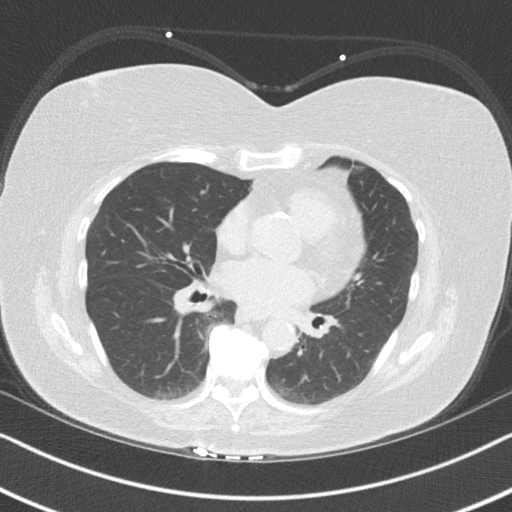
[im 34/41  lung]
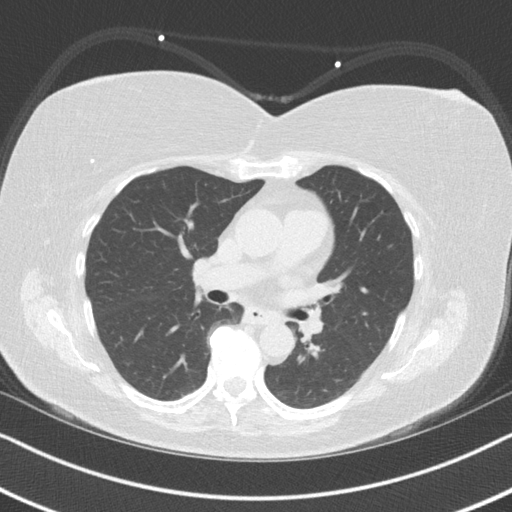

[14 of 20 positions shown; findings below may reference images not displayed]

FINDINGS: Aortic atherosclerosis. Within the visualized portions of the thorax
there are no suspicious appearing pulmonary nodules or masses, there
is no acute consolidative airspace disease, no pleural effusions, no
pneumothorax and no lymphadenopathy. Visualized portions of the
upper abdomen are unremarkable. Small hiatal hernia. There are no
aggressive appearing lytic or blastic lesions noted in the
visualized portions of the skeleton.
IMPRESSION: 1.  Aortic Atherosclerosis (54ANY-CGT.T).
FINDINGS: Non-cardiac: See separate report from [REDACTED].

Ascending Aorta: Normal caliber. Aortic atherosclerosis. Aortic root
calcification overlying the RCA ostium.

Pericardium: Normal

Coronary arteries: Normal origin.  LAD and RCA calcification.
IMPRESSION: Coronary calcium score of 61. This was 57th percentile for age and
sex matched control.

*** End of Addendum ***
EXAM:
OVER-READ INTERPRETATION  CT CHEST

The following report is an over-read performed by radiologist Dr.
Deslander Marrom [REDACTED] on 11/20/2019. This
over-read does not include interpretation of cardiac or coronary
anatomy or pathology. The coronary calcium score interpretation by
the cardiologist is attached.
FINDINGS: Aortic atherosclerosis. Within the visualized portions of the thorax
there are no suspicious appearing pulmonary nodules or masses, there
is no acute consolidative airspace disease, no pleural effusions, no
pneumothorax and no lymphadenopathy. Visualized portions of the
upper abdomen are unremarkable. Small hiatal hernia. There are no
aggressive appearing lytic or blastic lesions noted in the
visualized portions of the skeleton.
IMPRESSION: 1.  Aortic Atherosclerosis (54ANY-CGT.T).

## 2021-07-27 ENCOUNTER — Other Ambulatory Visit: Payer: Self-pay | Admitting: Family

## 2021-07-27 DIAGNOSIS — Z1231 Encounter for screening mammogram for malignant neoplasm of breast: Secondary | ICD-10-CM

## 2021-08-07 ENCOUNTER — Other Ambulatory Visit: Payer: Self-pay | Admitting: Family

## 2021-08-07 DIAGNOSIS — F32A Depression, unspecified: Secondary | ICD-10-CM

## 2021-08-07 DIAGNOSIS — F419 Anxiety disorder, unspecified: Secondary | ICD-10-CM

## 2021-08-08 NOTE — Telephone Encounter (Signed)
Requesting: alprazolam Last Visit: 04/08/21 Next Visit: none Last Refill: 06/02/21  Please Advise

## 2021-08-31 ENCOUNTER — Ambulatory Visit
Admission: RE | Admit: 2021-08-31 | Discharge: 2021-08-31 | Disposition: A | Discharge status: Home / Self Care | Payer: Medicare Other | Source: Ambulatory Visit | Attending: Family | Admitting: Family

## 2021-08-31 DIAGNOSIS — Z1231 Encounter for screening mammogram for malignant neoplasm of breast: Secondary | ICD-10-CM

## 2021-09-07 DIAGNOSIS — L821 Other seborrheic keratosis: Secondary | ICD-10-CM | POA: Diagnosis not present

## 2021-09-07 DIAGNOSIS — I788 Other diseases of capillaries: Secondary | ICD-10-CM | POA: Diagnosis not present

## 2021-09-07 DIAGNOSIS — L720 Epidermal cyst: Secondary | ICD-10-CM | POA: Diagnosis not present

## 2021-09-07 DIAGNOSIS — Z85828 Personal history of other malignant neoplasm of skin: Secondary | ICD-10-CM | POA: Diagnosis not present

## 2021-09-07 DIAGNOSIS — D225 Melanocytic nevi of trunk: Secondary | ICD-10-CM | POA: Diagnosis not present

## 2021-09-07 DIAGNOSIS — D485 Neoplasm of uncertain behavior of skin: Secondary | ICD-10-CM | POA: Diagnosis not present

## 2021-09-07 DIAGNOSIS — C44619 Basal cell carcinoma of skin of left upper limb, including shoulder: Secondary | ICD-10-CM | POA: Diagnosis not present

## 2021-09-07 DIAGNOSIS — L570 Actinic keratosis: Secondary | ICD-10-CM | POA: Diagnosis not present

## 2021-09-07 DIAGNOSIS — D2261 Melanocytic nevi of right upper limb, including shoulder: Secondary | ICD-10-CM | POA: Diagnosis not present

## 2021-09-20 DIAGNOSIS — D485 Neoplasm of uncertain behavior of skin: Secondary | ICD-10-CM | POA: Diagnosis not present

## 2021-09-20 DIAGNOSIS — L988 Other specified disorders of the skin and subcutaneous tissue: Secondary | ICD-10-CM | POA: Diagnosis not present

## 2021-09-27 ENCOUNTER — Telehealth: Payer: Self-pay | Admitting: Family

## 2021-09-27 NOTE — Telephone Encounter (Signed)
[name] stated her bp is lower than normal 110/70 transferred pt to triage.

## 2021-09-27 NOTE — Telephone Encounter (Signed)
Pt was transferred back from triage, and stated they told her to be seen within 3 days. She did not want to see another provider sooner, so she was scheduled 2/3.

## 2021-09-27 NOTE — Telephone Encounter (Signed)
Nurse Assessment Nurse: Adrian Blackwater, RN, Claiborne Billings Date/Time Eilene Ghazi Time): 09/27/2021 3:40:23 PM Confirm and document reason for call. If symptomatic, describe symptoms. ---Pt states she has not "felt right" ever since she had knee surgery last September. Today she feels her BP was low 120/73 and "that is low to me", making her feel tired. Does the patient have any new or worsening symptoms? ---Yes Will a triage be completed? ---Yes Related visit to physician within the last 2 weeks? ---No Does the PT have any chronic conditions? (i.e. diabetes, asthma, this includes High risk factors for pregnancy, etc.) ---Yes List chronic conditions. ---HTN, HLD, Hypothyroidism, one kidney Is this a behavioral health or substance abuse call? ---No Guidelines Guideline Title Affirmed Question Affirmed Notes Nurse Date/Time (Eastern Time) Blood Pressure - Low Wants doctor to measure BP Gigi Gin 09/27/2021 3:43:22 PM Disp. Time Eilene Ghazi Time) Disposition Final User 09/27/2021 3:46:59 PM SEE PCP WITHIN 3 DAYS Yes Adrian Blackwater, RN, Claiborne Billings PLEASE NOTE: All timestamps contained within this report are represented as [place] Standard Time. CONFIDENTIALTY NOTICE: This fax transmission is intended only for the addressee. It contains information that is legally privileged, confidential or otherwise protected from use or disclosure. If you are not the intended recipient, you are strictly prohibited from reviewing, disclosing, copying using or disseminating any of this information or taking any action in reliance on or regarding this information. If you have received this fax in error, please notify us immediately by telephone so that we can arrange for its return to [place]. Phone: [phone], Toll-Free: [phone], Fax: [phone] Page: 2 of 2 Call Id: [id number] [place] Disagree/Comply Comply Caller Understands Yes PreDisposition Call Doctor Care Advice Given Per Guideline SEE PCP WITHIN 3 DAYS: * You need to  be seen within 2 or 3 days. WHERE CAN YOU GO TO GET A BLOOD PRESSURE CHECK? * Sometimes people are not sure if they are taking the blood pressure right. CALL BACK IF: * Lightheadedness, weakness, or dizziness occurs * You become worse Comments User: Delana Meyer, RN Date/Time (Eastern Time): 09/27/2021 3:45:16 PM Pt states BP is usually 135/80 Referrals REFERRED TO PCP OFFICE

## 2021-09-30 ENCOUNTER — Encounter: Payer: Self-pay | Admitting: Family

## 2021-09-30 ENCOUNTER — Ambulatory Visit (INDEPENDENT_AMBULATORY_CARE_PROVIDER_SITE_OTHER): Payer: Medicare Other | Admitting: Family

## 2021-09-30 VITALS — BP 120/70 | HR 92 | Temp 98.2°F | Ht 65.0 in | Wt 162.0 lb

## 2021-09-30 DIAGNOSIS — R5383 Other fatigue: Secondary | ICD-10-CM | POA: Diagnosis not present

## 2021-09-30 DIAGNOSIS — E890 Postprocedural hypothyroidism: Secondary | ICD-10-CM

## 2021-09-30 DIAGNOSIS — E538 Deficiency of other specified B group vitamins: Secondary | ICD-10-CM

## 2021-09-30 DIAGNOSIS — I10 Essential (primary) hypertension: Secondary | ICD-10-CM

## 2021-09-30 NOTE — Progress Notes (Addendum)
[name] is a 76 y.o. female with the following history as recorded in [employer]:  Patient Active Problem List   Diagnosis Date Noted   Eustachian tube dysfunction 01/12/2021   Left Achilles tendinitis 08/25/2020   Chronic hip pain after total replacement of left hip joint 06/15/2020   Primary localized osteoarthritis of hip 06/24/2019   Primary osteoarthritis of left hip 06/02/2019   S/P lumbar fusion 04/23/2019   Lumbar spinal stenosis 07/16/2018   Renal cyst 07/16/2018   Left lumbar radiculopathy 06/24/2018   Left hip pain 10/25/2017   Greater trochanteric bursitis of left hip 10/03/2017   Anxiety and depression 05/23/2017   Hyperlipidemia 04/21/2016   Routine general medical examination at a health care facility 01/05/2016   Medicare annual wellness visit, subsequent 01/05/2016   Allergic rhinitis 12/29/2015   Vaginal atrophy 10/22/2013   DJD (degenerative joint disease) of knee 10/18/2012   NEPHRECTOMY, HX OF 05/28/2009   Hypothyroidism, postsurgical 07/18/2007   HYPERLIPIDEMIA 07/18/2007   Essential hypertension 07/18/2007   OSTEOPENIA 07/15/2007   History of colonic polyps 07/15/2007    Current Outpatient Medications  Medication Sig Dispense Refill   ALPRAZolam (XANAX) 0.25 MG tablet TAKE 2 TABLETS BY MOUTH AT BEDTIME AS NEEDED FOR ANXIETY 60 tablet 1   amLODipine (NORVASC) 10 MG tablet Take 1 tablet (10 mg total) by mouth daily. 90 tablet 1   benazepril (LOTENSIN) 40 MG tablet TAKE 1 TABLET BY MOUTH EVERY DAY 90 tablet 3   celecoxib (CELEBREX) 200 MG capsule TAKE 1 CAPSULE BY MOUTH DAILY AS NEEDED (Patient taking differently: TAKE 1 CAPSULE BY MOUTH DAILY AS NEEDED (M,W,F)) 30 capsule 5   ezetimibe (ZETIA) 10 MG tablet TAKE 1 TABLET BY MOUTH EVERY DAY 90 tablet 3   levothyroxine (SYNTHROID) 100 MCG tablet Take 1 tablet (100 mcg total) by mouth daily. 90 tablet 1   metoprolol succinate (TOPROL-XL) 50 MG 24 hr tablet Take 1 tablet (50 mg total) by mouth daily. Take  with or immediately following a meal. 90 tablet 3   Multiple Vitamins-Minerals (CENTRUM PO) Take 1 tablet by mouth daily.     potassium citrate (UROCIT-K) 10 MEQ (1080 MG) SR tablet Take 10 mEq by mouth daily.     gabapentin (NEURONTIN) 100 MG capsule Take 1 capsule (100 mg total) by mouth 2 (two) times daily. For pain. (Patient not taking: Reported on 09/30/2021) 180 capsule 3   No current facility-administered medications for this visit.    Allergies: Atorvastatin, Rosuvastatin, Simvastatin, Codeine sulfate, and Erythromycin  Past Medical History:  Diagnosis Date   Adenomatous colon polyp    Arthritis    Cataract    Complication of anesthesia    Heart murmur    MVP   Hyperlipidemia    Hypertension    Hypothyroidism    PONV (postoperative nausea and vomiting)    Thyroid disease     Past Surgical History:  Procedure Laterality Date   BACK SURGERY     BREAST EXCISIONAL BIOPSY Right 1983   COLONOSCOPY W/ POLYPECTOMY  2003,2006,2013   [name]   DIAGNOSTIC LAPAROSCOPY  1979   exploratory to get pregnant   DILATION AND CURETTAGE OF UTERUS     X2   NEPHRECTOMY  2010   Left for 9.8cm benign complex cyst   THYROIDECTOMY  2000   [name]   TONSILLECTOMY     TOTAL ABDOMINAL HYSTERECTOMY W/ BILATERAL SALPINGOOPHORECTOMY  2003   [name] for dysfunctional menses   TOTAL HIP ARTHROPLASTY Left 06/24/2019  Procedure: TOTAL HIP ARTHROPLASTY ANTERIOR APPROACH;  Surgeon: Murphy, Timothy D, MD;  Location: [facility];  Service: Orthopedics;  Laterality: Left;  °  °Family History  °Problem Relation Age of Onset  ° Transient ischemic attack Father   °     in 80s  ° Coronary artery disease Father   °     stents @ 85  ° Nephrolithiasis Father   ° Hyperlipidemia Mother   ° Transient ischemic attack Mother 82  ° Thyroid disease Mother   °     hyperthyroid  ° Urolithiasis Sister   ° Diabetes Neg Hx   °  °Social History  ° °Tobacco Use  ° Smoking status: Former  °  Packs/day: 0.50  °  Years: 4.00  °  Pack  years: 2.00  °  Types: Cigarettes  °  Quit date: 08/29/1975  °  Years since quitting: 46.1  ° Smokeless tobacco: Never  ° Tobacco comments:  °  smoked 1973-1977, up to 1/2 ppd  °Substance Use Topics  ° Alcohol use: Yes  °  Alcohol/week: 0.0 standard drinks  °  Comment: minimally  1 every 6 months  °  °Subjective:  °Patient notes in general she has not been feeling "good" for months; just overwhelmed with all her healthcare right now and would like to talk through what is going on with her; °Is worried that her blood pressure is too low; notes that she feels better when her blood pressure is at 140/90; °Since she was last seen here, patient has been diagnosed with pituitary mass; was told that she was due for repeat MRI in January but was overwhelmed and cancelled appointment.  ° ° ° °Objective:  °Vitals:  ° 09/30/21 1326  °BP: 120/70  °Pulse: 92  °Temp: 98.2 °F (36.8 °C)  °TempSrc: Oral  °SpO2: 96%  °Weight: 162 lb (73.5 kg)  °Height: 5' 5" (1.651 m)  °  °General: Well developed, well nourished, in no acute distress  °Skin : Warm and dry.  °Head: Normocephalic and atraumatic  °Lungs: Respirations unlabored; clear to auscultation bilaterally without wheeze, rales, rhonchi  °CVS exam: normal rate and regular rhythm.  °Neurologic: Alert and oriented; speech intact; face symmetrical; moves all extremities well; CNII-XII intact without focal deficit  ° °Assessment:  °1. Other fatigue   °2. Hypothyroidism, postsurgical   °3. Low vitamin B12 level   °4. Essential hypertension   °  °Plan:  °Stressed need to follow up with neurosurgeon to get MRI scheduled and follow up on pituitary tumor; discussed that pituitary abnormality could be causing majority of her symptoms; ° °Will try cutting Toprol XL to 25 mg; continue Amlodipine and Benazepril as prescribed; ° °Will update labs today; will need to also consider sleep study;  ° °Follow up in 1-2 months;  ° °Time spent 30 minutes ° °This visit occurred during the SARS-CoV-2 public  health emergency.  Safety protocols were in place, including screening questions prior to the visit, additional usage of staff PPE, and extensive cleaning of exam room while observing appropriate contact time as indicated for disinfecting solutions.  °  ° °No follow-ups on file.  °Orders Placed This Encounter  °Procedures  ° CBC with Differential/Platelet  ° Comp Met (CMET)  ° TSH  ° B12  °  °Requested Prescriptions  ° ° No prescriptions requested or ordered in this encounter  °  ° °

## 2021-09-30 NOTE — Patient Instructions (Signed)
Cut the Toprol XL in 1/2- take 25 mg at night;  Continue the Amlodipine and Benazepril in am;   Call Dr. Ronnald Ramp to get your MRI scheduled;   We will come back to the cholesterol at a later date;   We will also need to consider a sleep study if we don't get any answers after seeing Dr. Ronnald Ramp;

## 2021-10-01 LAB — CBC WITH DIFFERENTIAL/PLATELET
Absolute Monocytes: 1024 cells/uL — ABNORMAL HIGH (ref 200–950)
Basophils Absolute: 62 cells/uL (ref 0–200)
Basophils Relative: 0.7 %
Eosinophils Absolute: 240 cells/uL (ref 15–500)
Eosinophils Relative: 2.7 %
HCT: 45.4 % — ABNORMAL HIGH (ref 35.0–45.0)
Hemoglobin: 15 g/dL (ref 11.7–15.5)
Lymphs Abs: 1353 cells/uL (ref 850–3900)
MCH: 30.9 pg (ref 27.0–33.0)
MCHC: 33 g/dL (ref 32.0–36.0)
MCV: 93.6 fL (ref 80.0–100.0)
MPV: 10.1 fL (ref 7.5–12.5)
Monocytes Relative: 11.5 %
Neutro Abs: 6221 cells/uL (ref 1500–7800)
Neutrophils Relative %: 69.9 %
Platelets: 330 10*3/uL (ref 140–400)
RBC: 4.85 10*6/uL (ref 3.80–5.10)
RDW: 13.3 % (ref 11.0–15.0)
Total Lymphocyte: 15.2 %
WBC: 8.9 10*3/uL (ref 3.8–10.8)

## 2021-10-01 LAB — COMPREHENSIVE METABOLIC PANEL
AG Ratio: 1.9 (calc) (ref 1.0–2.5)
ALT: 19 U/L (ref 6–29)
AST: 23 U/L (ref 10–35)
Albumin: 4.7 g/dL (ref 3.6–5.1)
Alkaline phosphatase (APISO): 116 U/L (ref 37–153)
BUN: 19 mg/dL (ref 7–25)
CO2: 26 mmol/L (ref 20–32)
Calcium: 9.8 mg/dL (ref 8.6–10.4)
Chloride: 104 mmol/L (ref 98–110)
Creat: 0.93 mg/dL (ref 0.60–1.00)
Globulin: 2.5 g/dL (calc) (ref 1.9–3.7)
Glucose, Bld: 78 mg/dL (ref 65–99)
Potassium: 4.5 mmol/L (ref 3.5–5.3)
Sodium: 140 mmol/L (ref 135–146)
Total Bilirubin: 0.5 mg/dL (ref 0.2–1.2)
Total Protein: 7.2 g/dL (ref 6.1–8.1)

## 2021-10-01 LAB — VITAMIN B12: Vitamin B-12: 639 pg/mL (ref 200–1100)

## 2021-10-01 LAB — TSH: TSH: 1.07 mIU/L (ref 0.40–4.50)

## 2021-10-03 ENCOUNTER — Encounter: Payer: Self-pay | Admitting: Family

## 2021-10-08 ENCOUNTER — Other Ambulatory Visit: Payer: Self-pay | Admitting: Family

## 2021-10-10 DIAGNOSIS — M1711 Unilateral primary osteoarthritis, right knee: Secondary | ICD-10-CM | POA: Diagnosis not present

## 2021-10-20 ENCOUNTER — Other Ambulatory Visit: Payer: Self-pay | Admitting: Family

## 2021-10-20 DIAGNOSIS — F32A Depression, unspecified: Secondary | ICD-10-CM

## 2021-10-20 DIAGNOSIS — F419 Anxiety disorder, unspecified: Secondary | ICD-10-CM

## 2021-10-21 NOTE — Telephone Encounter (Signed)
Requesting: Xanax 0.25 Contract: none UDS: none Last Visit: 09/30/21 Next Visit: not scheduled Last Refill: 08/09/21, #60, 1 refill   Please Advise

## 2021-10-25 DIAGNOSIS — E236 Other disorders of pituitary gland: Secondary | ICD-10-CM | POA: Diagnosis not present

## 2021-10-26 DIAGNOSIS — M1711 Unilateral primary osteoarthritis, right knee: Secondary | ICD-10-CM | POA: Diagnosis not present

## 2021-10-31 ENCOUNTER — Other Ambulatory Visit: Payer: Self-pay | Admitting: Family

## 2021-11-02 DIAGNOSIS — M1711 Unilateral primary osteoarthritis, right knee: Secondary | ICD-10-CM | POA: Diagnosis not present

## 2021-11-03 ENCOUNTER — Other Ambulatory Visit: Payer: Self-pay | Admitting: Family

## 2021-11-03 DIAGNOSIS — G9389 Other specified disorders of brain: Secondary | ICD-10-CM | POA: Diagnosis not present

## 2021-11-03 DIAGNOSIS — I10 Essential (primary) hypertension: Secondary | ICD-10-CM | POA: Diagnosis not present

## 2021-11-03 DIAGNOSIS — E236 Other disorders of pituitary gland: Secondary | ICD-10-CM | POA: Diagnosis not present

## 2021-11-09 DIAGNOSIS — M1711 Unilateral primary osteoarthritis, right knee: Secondary | ICD-10-CM | POA: Diagnosis not present

## 2021-11-21 ENCOUNTER — Telehealth: Payer: Self-pay | Admitting: Family

## 2021-11-21 NOTE — Telephone Encounter (Signed)
Left message for patient to call back and schedule Medicare Annual Wellness Visit (AWV) in office.  ? ?If not able to come in office, please offer to do virtually or by telephone.  Left office number and my jabber [phone]. ? ?Last AWV:02/21/2019 ? ?Please schedule at anytime with Nurse Health Advisor. ?  ?

## 2021-12-06 ENCOUNTER — Other Ambulatory Visit: Payer: Self-pay | Admitting: Family

## 2021-12-12 ENCOUNTER — Encounter: Payer: Self-pay | Admitting: Internal Medicine

## 2021-12-12 ENCOUNTER — Ambulatory Visit: Payer: Medicare Other | Admitting: Internal Medicine

## 2021-12-12 VITALS — BP 130/78 | HR 71 | Ht 65.0 in | Wt 161.0 lb

## 2021-12-12 DIAGNOSIS — R5383 Other fatigue: Secondary | ICD-10-CM | POA: Diagnosis not present

## 2021-12-12 DIAGNOSIS — D352 Benign neoplasm of pituitary gland: Secondary | ICD-10-CM

## 2021-12-12 DIAGNOSIS — E890 Postprocedural hypothyroidism: Secondary | ICD-10-CM

## 2021-12-12 LAB — BASIC METABOLIC PANEL
BUN: 19 mg/dL (ref 6–23)
CO2: 29 mEq/L (ref 19–32)
Calcium: 10.3 mg/dL (ref 8.4–10.5)
Chloride: 102 mEq/L (ref 96–112)
Creatinine, Ser: 1.02 mg/dL (ref 0.40–1.20)
GFR: 53.67 mL/min — ABNORMAL LOW (ref >60.00)
Glucose, Bld: 79 mg/dL (ref 70–99)
Potassium: 4.8 mEq/L (ref 3.5–5.1)
Sodium: 141 mEq/L (ref 135–145)

## 2021-12-12 LAB — CORTISOL: Cortisol, Plasma: 6.5 ug/dL

## 2021-12-12 LAB — VITAMIN D 25 HYDROXY (VIT D DEFICIENCY, FRACTURES): VITD: 22.27 ng/mL — ABNORMAL LOW (ref 30.00–100.00)

## 2021-12-12 LAB — FOLLICLE STIMULATING HORMONE: FSH: 79.5 m[IU]/mL

## 2021-12-12 LAB — TSH: TSH: 1.44 u[IU]/mL (ref 0.35–5.50)

## 2021-12-12 NOTE — Progress Notes (Signed)
? ? ?Name: [name]  ?MRN/ DOB: [id number], 01-28-1946    ?Age/ Sex: 76 y.o., female   ? ?PCP: Marrian Salvage, [place]   ?Reason for Endocrinology Evaluation: Pituitary macroadenoma   ?   ?Date of Initial Endocrinology Evaluation: 12/12/2021   ? ? ?HPI: ?Ms. [name] is a 77 y.o. female with a past medical history of HTN, postoperative Hypothyroidism , dyslipidemia , Hx of left nephrectomy ( 9.8 cm benign cyst) .  The patient presented for initial endocrinology clinic visit on 12/12/2021 for consultative assistance with her Pituitary macroadenoma .  ? ? ? 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  ? ? ?She is S/P total  thyroidectomy in 2000 . Per pt small areas of cancer were found but no further treatment was needed  ? ? ?She sees Dr. Constance Holster for right hearing loss and tinnitus  ?Has right kidney stone  ? ?Denies headaches  ?Had cataract sx , has been having blurry vision as well as dry eyes  ?Denies constipation nor diarrhea  ?Weight stable  ?Denies excessive sweating  ?Denies change in shoe size but has noted change in ring size  ?Denies galactorrhea  ?She has no stamina  ?Nocturia x2 but no polydipsia  ? ? ?Denies local neck swelling  ?She has right knee OA, she is S/P left knee sx  ?She had prednisone ~ 3 weeks ago  ?No Biotin  ? ? ? ? ? ? ? ?HISTORY:  ?Past Medical History:  ?Past Medical History:  ?Diagnosis Date  ? Adenomatous colon polyp   ? Arthritis   ? Cataract   ? Complication of anesthesia   ? Heart murmur   ? MVP  ? Hyperlipidemia   ? Hypertension   ? Hypothyroidism   ? PONV (postoperative nausea and vomiting)   ? Thyroid disease   ? ?Past Surgical History:  ?Past Surgical History:  ?Procedure Laterality Date  ? BACK SURGERY    ? BREAST EXCISIONAL BIOPSY Right 1983  ? COLONOSCOPY W/ POLYPECTOMY  2003,2006,2013  ? [name]  ? DIAGNOSTIC LAPAROSCOPY  1979  ? exploratory to get pregnant  ? DILATION AND CURETTAGE OF  UTERUS    ? X2  ? NEPHRECTOMY  2010  ? Left for 9.8cm benign complex cyst  ? THYROIDECTOMY  2000  ? [name]  ? TONSILLECTOMY    ? TOTAL ABDOMINAL HYSTERECTOMY W/ BILATERAL SALPINGOOPHORECTOMY  2003  ? [name] for dysfunctional menses  ? TOTAL HIP ARTHROPLASTY Left 06/24/2019  ? Procedure: TOTAL HIP ARTHROPLASTY ANTERIOR APPROACH;  Surgeon: Renette Butters, MD;  Location: [facility];  Service: Orthopedics;  Laterality: Left;  ?  ?Social History:  reports that she quit smoking about 46 years ago. Her smoking use included cigarettes. She has a 2.00 pack-year smoking history. She has never used smokeless tobacco. She reports current alcohol use. She reports that she does not use drugs. ?Family History: family history includes Coronary artery disease in her father; Hyperlipidemia in her mother; Nephrolithiasis in her father; Thyroid disease in her mother; Transient ischemic attack in her father; Transient ischemic attack (age of onset: 50) in her mother; Urolithiasis in her sister. ? ? ?HOME MEDICATIONS: ?Allergies as of 12/12/2021   ? ?   Reactions  ? Atorvastatin   ? myalgias   ? Rosuvastatin Other (See Comments)  ? Leg & muscle weakness  ? Simvastatin   ? myalgias  ? Codeine Sulfate   ? "  made me jumpy"  ? Erythromycin Nausea Only  ? ?  ? ?  ?Medication List  ?  ? ?  ? Accurate as of December 12, 2021  1:26 PM. If you have any questions, ask your nurse or doctor.  ?  ?  ? ?  ? ?ALPRAZolam 0.25 MG tablet ?Commonly known as: Duanne Moron ?TAKE 2 TABLETS BY MOUTH AT BEDTIME AS NEEDED FOR ANXIETY ?  ?amLODipine 10 MG tablet ?Commonly known as: NORVASC ?TAKE 1 TABLET BY MOUTH EVERY DAY ?  ?benazepril 40 MG tablet ?Commonly known as: LOTENSIN ?TAKE 1 TABLET BY MOUTH EVERY DAY ?What changed: Another medication with the same name was removed. Continue taking this medication, and follow the directions you see here. ?Changed by: Dorita Sciara, MD ?  ?celecoxib 200 MG capsule ?Commonly known as: CELEBREX ?TAKE 1 CAPSULE BY MOUTH  EVERY DAY AS NEEDED ?  ?CENTRUM PO ?Take 1 tablet by mouth daily. ?  ?ezetimibe 10 MG tablet ?Commonly known as: ZETIA ?TAKE 1 TABLET BY MOUTH EVERY DAY ?  ?gabapentin 100 MG capsule ?Commonly known as: Neurontin ?Take 1 capsule (100 mg total) by mouth 2 (two) times daily. For pain. ?  ?Levothyroxine Sodium 100 MCG Caps ?Take 100 mcg by mouth daily at 6 (six) AM. ?What changed: Another medication with the same name was removed. Continue taking this medication, and follow the directions you see here. ?Changed by: Dorita Sciara, MD ?  ?metoprolol succinate 50 MG 24 hr tablet ?Commonly known as: TOPROL-XL ?Take 1 tablet (50 mg total) by mouth daily. Take with or immediately following a meal. ?What changed: how much to take ?  ?NON FORMULARY ?Take 2,500 Units by mouth. daily ?  ?potassium citrate 10 MEQ (1080 MG) SR tablet ?Commonly known as: UROCIT-K ?Take 10 mEq by mouth daily. ?  ?TYLENOL ARTHRITIS PAIN PO ?Take by mouth. ?  ? ?  ?  ? ? ?REVIEW OF SYSTEMS: ?A comprehensive ROS was conducted with the patient and is negative except as per HPI  ? ? ?OBJECTIVE:  ?VS: BP 130/78 (BP Location: Left Arm, Patient Position: Sitting, Cuff Size: Small)   Pulse 71   Ht '5\' 5"'$  (1.651 m)   Wt 161 lb (73 kg)   SpO2 95%   BMI 26.79 kg/m?   ? ?Wt Readings from Last 3 Encounters:  ?12/12/21 161 lb (73 kg)  ?09/30/21 162 lb (73.5 kg)  ?05/06/21 169 lb (76.7 kg)  ? ? ? ?EXAM: ?General: Pt appears well and is in NAD  ?Neck: General: Supple without adenopathy. ?Thyroid: Thyroid size normal.  No goiter or nodules appreciated.   ?Lungs: Clear with good BS bilat with no rales, rhonchi, or wheezes  ?Heart: Auscultation: RRR.  ?Abdomen: Normoactive bowel sounds, soft, nontender, without masses or organomegaly palpable  ?Extremities:  ?BL LE: No pretibial edema normal ROM and strength.  ?Mental Status: Judgment, insight: Intact ?Orientation: Oriented to time, place, and person ?Memory: Intact for recent and remote events ?Mood and  affect: No depression, anxiety, or agitation  ? ? ? ?DATA REVIEWED: ? ? Latest Reference Range & Units 12/12/21 13:48  ?Sodium 135 - 145 mEq/L 141  ?Potassium 3.5 - 5.1 mEq/L 4.8  ?Chloride 96 - 112 mEq/L 102  ?CO2 19 - 32 mEq/L 29  ?Glucose 70 - 99 mg/dL 79  ?BUN 6 - 23 mg/dL 19  ?Creatinine 0.40 - 1.20 mg/dL 1.02  ?Calcium 8.4 - 10.5 mg/dL 10.3  ?GFR >60.00 mL/min 53.67 (L)  ? ?  Latest Reference Range & Units 12/12/21 13:48  ?VITD 30.00 - 100.00 ng/mL 22.27 (L)  ? ? ? Latest Reference Range & Units 12/12/21 13:48  ?Cortisol, Plasma ug/dL 6.5  ?FSH mIU/ML 79.5  ?Prolactin ng/mL 7.3  ?Glucose 70 - 99 mg/dL 79  ?TSH 0.35 - 5.50 uIU/mL 1.44  ? ? ? ? ? ? ?MR brain 04/12/2021 ? Brain: ?  ?Cerebral volume is normal. ?  ?0.7 x 1.3 cm (AP x TV) precontrast T1 hyperintense and hypoenhancing ?lesion within the mid-to-posterior the pituitary gland (for instance ?as seen on series 2, image 12) (series 14, image 26). ?  ?Mild multifocal T2/FLAIR hyperintensity within the cerebral white ?matter and pons, nonspecific but compatible with chronic small ?vessel ischemic disease. ?  ?No cerebellopontine angle or internal auditory canal mass is ?demonstrated. Unremarkable appearance of the seventh and eighth ?cranial nerves bilaterally. ?  ?There is no acute infarct. ?  ?No extra-axial fluid collection. ?  ?No midline shift. ?  ?Vascular: Maintained flow voids within the proximal large arterial ?vessels. ?  ?Skull and upper cervical spine: No focal suspicious marrow lesion. ?  ?Sinuses/Orbits: Visualized orbits show no acute finding. No ?significant paranasal sinus disease. ?  ?Impression #2 will be called to the ordering clinician or ?representative by the Radiologist Assistant, and communication ?documented in the PACS or [employer]. ?  ?IMPRESSION: ?No cerebellopontine angle or internal auditory canal mass. ?  ?1.3 x 0.7 cm precontrast T1 hyperintense and hypoenhancing lesion ?within the mid-to-posterior pituitary gland. This  could reflect a ?pituitary adenoma containing non-acute hemorrhage or proteinaceous ?material, or a Rathke's cleft cyst. Correlate with relevant ?endocrinologic laboratory values. Additionally, consider a ?contrast

## 2021-12-12 NOTE — Patient Instructions (Addendum)
Please make sure you have a Visual Field testing through your eye doctor's office due to pituitary adenoma measuring 1.3 cm  ?

## 2021-12-13 ENCOUNTER — Encounter: Payer: Self-pay | Admitting: Internal Medicine

## 2021-12-13 MED ORDER — LEVOTHYROXINE SODIUM 100 MCG PO CAPS: 100.0000 ug | ORAL_CAPSULE | Freq: Every day | ORAL | 3 refills | Status: DC

## 2021-12-14 ENCOUNTER — Encounter: Payer: Self-pay | Admitting: Internal Medicine

## 2021-12-19 LAB — THYROGLOBULIN ANTIBODY: Thyroglobulin Ab: <1 IU/mL (ref <=1)

## 2021-12-19 LAB — THYROGLOBULIN LEVEL: Thyroglobulin: 0.9 ng/mL — ABNORMAL LOW

## 2021-12-19 LAB — INSULIN-LIKE GROWTH FACTOR
IGF-I, LC/MS: 165 ng/mL (ref 34–245)
Z-Score (Female): 1 SD (ref ?–2.0)

## 2021-12-19 LAB — PROLACTIN: Prolactin: 7.3 ng/mL

## 2021-12-19 LAB — ACTH: C206 ACTH: 15 pg/mL (ref 6–50)

## 2021-12-20 ENCOUNTER — Ambulatory Visit (INDEPENDENT_AMBULATORY_CARE_PROVIDER_SITE_OTHER): Payer: Medicare Other

## 2021-12-20 DIAGNOSIS — Z0000 Encounter for general adult medical examination without abnormal findings: Secondary | ICD-10-CM | POA: Diagnosis not present

## 2021-12-20 NOTE — Progress Notes (Addendum)
Virtual Visit via Telephone Note ? ?I connected with  [name] on 12/20/21 at  1:00 PM EDT by telephone and verified that I am speaking with the correct person using two identifiers. ? ?Medicare Annual Wellness visit completed telephonically due to Covid-19 pandemic.  ? ?Persons participating in this call: This Health Coach and this patient.  ? ?Location: ?Patient: home ?Provider: office ?  ?I discussed the limitations, risks, security and privacy concerns of performing an evaluation and management service by telephone and the availability of in person appointments. The patient expressed understanding and agreed to proceed. ? ?Unable to perform video visit due to video visit attempted and failed and/or patient does not have video capability.  ? ?Some vital signs may be absent or patient reported.  ? ?Willette Brace, LPN ? ? ?Subjective:  ? [name] is a 76 y.o. female who presents for Medicare Annual (Subsequent) preventive examination. ? ?Review of Systems    ? ?Cardiac Risk Factors include: advanced age (>66mn, >>39women);hypertension;dyslipidemia ? ?   ?Objective:  ?  ?There were no vitals filed for this visit. ?There is no height or weight on file to calculate BMI. ? ? ?  12/20/2021  ? 12:51 PM 06/24/2019  ? 10:35 AM 06/16/2019  ? 10:26 AM 04/23/2019  ? 12:30 PM 04/21/2019  ?  2:46 PM 02/21/2019  ?  2:45 PM 02/15/2018  ?  2:28 PM  ?Advanced Directives  ?Does Patient Have a Medical Advance Directive? No No No No No No No  ?Would patient like information on creating a medical advance directive? Yes (MAU/Ambulatory/Procedural Areas - Information given) No - Patient declined No - Patient declined No - Patient declined No - Patient declined Yes (ED - Information included in AVS) Yes (ED - Information included in AVS)  ? ? ?Current Medications (verified) ?Outpatient Encounter Medications as of 12/20/2021  ?Medication Sig  ? Acetaminophen (TYLENOL ARTHRITIS PAIN PO) Take by mouth.  ? ALPRAZolam (XANAX) 0.25 MG  tablet TAKE 2 TABLETS BY MOUTH AT BEDTIME AS NEEDED FOR ANXIETY  ? amLODipine (NORVASC) 10 MG tablet TAKE 1 TABLET BY MOUTH EVERY DAY  ? benazepril (LOTENSIN) 40 MG tablet TAKE 1 TABLET BY MOUTH EVERY DAY  ? celecoxib (CELEBREX) 200 MG capsule TAKE 1 CAPSULE BY MOUTH EVERY DAY AS NEEDED  ? ezetimibe (ZETIA) 10 MG tablet TAKE 1 TABLET BY MOUTH EVERY DAY  ? gabapentin (NEURONTIN) 100 MG capsule Take 1 capsule (100 mg total) by mouth 2 (two) times daily. For pain.  ? Levothyroxine Sodium 100 MCG CAPS Take 1 capsule (100 mcg total) by mouth daily at 6 (six) AM.  ? metoprolol succinate (TOPROL-XL) 50 MG 24 hr tablet Take 1 tablet (50 mg total) by mouth daily. Take with or immediately following a meal. (Patient taking differently: Take 25 mg by mouth daily. Take with or immediately following a meal.)  ? Multiple Vitamins-Minerals (CENTRUM PO) Take 1 tablet by mouth daily.  ? NON FORMULARY Take 2,500 Units by mouth. daily  ? potassium citrate (UROCIT-K) 10 MEQ (1080 MG) SR tablet Take 10 mEq by mouth daily.  ? [DISCONTINUED] simvastatin (ZOCOR) 20 MG tablet Take 20 mg by mouth at bedtime.    ? ?No facility-administered encounter medications on file as of 12/20/2021.  ? ? ?Allergies (verified) ?Atorvastatin, Rosuvastatin, Simvastatin, Codeine sulfate, and Erythromycin  ? ?History: ?Past Medical History:  ?Diagnosis Date  ? Adenomatous colon polyp   ? Arthritis   ? Cataract   ? Complication  of anesthesia   ? Heart murmur   ? MVP  ? Hyperlipidemia   ? Hypertension   ? Hypothyroidism   ? PONV (postoperative nausea and vomiting)   ? Thyroid disease   ? ?Past Surgical History:  ?Procedure Laterality Date  ? BACK SURGERY    ? BREAST EXCISIONAL BIOPSY Right 1983  ? COLONOSCOPY W/ POLYPECTOMY  2003,2006,2013  ? [name]  ? DIAGNOSTIC LAPAROSCOPY  1979  ? exploratory to get pregnant  ? DILATION AND CURETTAGE OF UTERUS    ? X2  ? NEPHRECTOMY  2010  ? Left for 9.8cm benign complex cyst  ? THYROIDECTOMY  2000  ? [name]  ?  TONSILLECTOMY    ? TOTAL ABDOMINAL HYSTERECTOMY W/ BILATERAL SALPINGOOPHORECTOMY  2003  ? [name] for dysfunctional menses  ? TOTAL HIP ARTHROPLASTY Left 06/24/2019  ? Procedure: TOTAL HIP ARTHROPLASTY ANTERIOR APPROACH;  Surgeon: Renette Butters, MD;  Location: [facility];  Service: Orthopedics;  Laterality: Left;  ? ?Family History  ?Problem Relation Age of Onset  ? Transient ischemic attack Father   ?     in 53s  ? Coronary artery disease Father   ?     stents @ 66  ? Nephrolithiasis Father   ? Hyperlipidemia Mother   ? Transient ischemic attack Mother 53  ? Thyroid disease Mother   ?     hyperthyroid  ? Urolithiasis Sister   ? Diabetes Neg Hx   ? ?Social History  ? ?Socioeconomic History  ? Marital status: Married  ?  Spouse name: Not on file  ? Number of children: 2  ? Years of education: Not on file  ? Highest education level: Not on file  ?Occupational History  ? Occupation: [employer]  ?Tobacco Use  ? Smoking status: Former  ?  Packs/day: 0.50  ?  Years: 4.00  ?  Pack years: 2.00  ?  Types: Cigarettes  ?  Quit date: 08/29/1975  ?  Years since quitting: 46.3  ? Smokeless tobacco: Never  ? Tobacco comments:  ?  smoked 1973-1977, up to 1/2 ppd  ?Vaping Use  ? Vaping Use: Never used  ?Substance and Sexual Activity  ? Alcohol use: Yes  ?  Alcohol/week: 0.0 standard drinks  ?  Comment: minimally  1 every 6 months  ? Drug use: No  ? Sexual activity: Yes  ?  Comment: HYST-1st intercourse 50 yo-5 partners  ?Other Topics Concern  ? Not on file  ?Social History Narrative  ? Fun/Hobbies: Grandkids  ? ?Social Determinants of Health  ? ?Financial Resource Strain: Low Risk   ? Difficulty of Paying Living Expenses: Not hard at all  ?Food Insecurity: No Food Insecurity  ? Worried About Charity fundraiser in the Last Year: Never true  ? Ran Out of Food in the Last Year: Never true  ?Transportation Needs: No Transportation Needs  ? Lack of Transportation (Medical): No  ? Lack of Transportation (Non-Medical): No  ?Physical  Activity: Inactive  ? Days of Exercise per Week: 0 days  ? Minutes of Exercise per Session: 0 min  ?Stress: No Stress Concern Present  ? Feeling of Stress : Not at all  ?Social Connections: Unknown  ? Frequency of Communication with Friends and Family: More than three times a week  ? Frequency of Social Gatherings with Friends and Family: More than three times a week  ? Attends Religious Services: Not on file  ? Active Member of Clubs or Organizations: Yes  ?  Attends Archivist Meetings: 1 to 4 times per year  ? Marital Status: Married  ? ? ?Tobacco Counseling ?Counseling given: Not Answered ?Tobacco comments: smoked 1973-1977, up to 1/2 ppd ? ? ?Clinical Intake: ? ?Pre-visit preparation completed: Yes ? ?Pain : No/denies pain ? ?  ? ?BMI - recorded: 26.79 ?Nutritional Status: BMI 25 -29 Overweight ?Nutritional Risks: None ?Diabetes: No ? ?How often do you need to have someone help you when you read instructions, pamphlets, or other written materials from your doctor or pharmacy?: 1 - Never ? ?Diabetic?no ? ?Interpreter Needed?: No ? ?Information entered by :: Charlott Rakes, LPN ? ? ?Activities of Daily Living ? ?  12/20/2021  ? 12:53 PM 01/12/2021  ? 10:34 AM  ?In your present state of health, do you have any difficulty performing the following activities:  ?Hearing? 1 1  ?Comment HOH right ear   ?Vision? 0 0  ?Difficulty concentrating or making decisions? 0 0  ?Walking or climbing stairs? 0 1  ?Dressing or bathing? 0 0  ?Doing errands, shopping? 0 0  ?Preparing Food and eating ? N   ?Using the Toilet? N   ?In the past six months, have you accidently leaked urine? Y   ?Comment at times and wears a pad at night   ?Do you have problems with loss of bowel control? N   ?Managing your Medications? N   ?Managing your Finances? N   ?Housekeeping or managing your Housekeeping? N   ? ? ?Patient Care Team: ?Marrian Salvage, FNP as PCP - General (Internal Medicine) ?Pixie Casino, MD as PCP - Cardiology  (Cardiology) ?Lyndal Pulley, DO as Consulting Physician (Family Medicine) ?Rutherford Guys, MD as Consulting Physician (Ophthalmology) ? ?Indicate any recent Medical Services you may have received from other than [facility]

## 2021-12-20 NOTE — Patient Instructions (Signed)
Ms. Plass , ?Thank you for taking time to come for your Medicare Wellness Visit. I appreciate your ongoing commitment to your health goals. Please review the following plan we discussed and let me know if I can assist you in the future.  ? ?Screening recommendations/referrals: ?Colonoscopy: pt stated she will schedule  ?Mammogram: Done 08/31/21 repeat every year  ?Bone Density: Done 06/01/20 repeat every 3-5 years  ?Recommended yearly ophthalmology/optometry visit for glaucoma screening and checkup ?Recommended yearly dental visit for hygiene and checkup ? ?Vaccinations: ?Influenza vaccine: Up to date per pt  ?Pneumococcal vaccine: Up to date ?Tdap vaccine: Done 02/05/13 repeat every 10 years  ?Shingles vaccine: Shingrix discussed. Please contact your pharmacy for coverage information.    ?Covid-19:Completed 1/23, 2/20, 06/21/20 ? ?Advanced directives: Advance directive discussed with you today. I have provided a copy for you to complete at home and have notarized. Once this is complete please bring a copy in to our office so we can scan it into your chart. ? ?Conditions/risks identified: Walk better  ? ?Next appointment: Follow up in one year for your annual wellness visit  ? ? ?Preventive Care 76 Years and Older, Female ?Preventive care refers to lifestyle choices and visits with your health care provider that can promote health and wellness. ?What does preventive care include? ?A yearly physical exam. This is also called an annual well check. ?Dental exams once or twice a year. ?Routine eye exams. Ask your health care provider how often you should have your eyes checked. ?Personal lifestyle choices, including: ?Daily care of your teeth and gums. ?Regular physical activity. ?Eating a healthy diet. ?Avoiding tobacco and drug use. ?Limiting alcohol use. ?Practicing safe sex. ?Taking low-dose aspirin every day. ?Taking vitamin and mineral supplements as recommended by your health care provider. ?What happens during an  annual well check? ?The services and screenings done by your health care provider during your annual well check will depend on your age, overall health, lifestyle risk factors, and family history of disease. ?Counseling  ?Your health care provider may ask you questions about your: ?Alcohol use. ?Tobacco use. ?Drug use. ?Emotional well-being. ?Home and relationship well-being. ?Sexual activity. ?Eating habits. ?History of falls. ?Memory and ability to understand (cognition). ?Work and work Statistician. ?Reproductive health. ?Screening  ?You may have the following tests or measurements: ?Height, weight, and BMI. ?Blood pressure. ?Lipid and cholesterol levels. These may be checked every 5 years, or more frequently if you are over 33 years old. ?Skin check. ?Lung cancer screening. You may have this screening every year starting at age 26 if you have a 30-pack-year history of smoking and currently smoke or have quit within the past 15 years. ?Fecal occult blood test (FOBT) of the stool. You may have this test every year starting at age 56. ?Flexible sigmoidoscopy or colonoscopy. You may have a sigmoidoscopy every 5 years or a colonoscopy every 10 years starting at age 16. ?Hepatitis C blood test. ?Hepatitis B blood test. ?Sexually transmitted disease (STD) testing. ?Diabetes screening. This is done by checking your blood sugar (glucose) after you have not eaten for a while (fasting). You may have this done every 1-3 years. ?Bone density scan. This is done to screen for osteoporosis. You may have this done starting at age 71. ?Mammogram. This may be done every 1-2 years. Talk to your health care provider about how often you should have regular mammograms. ?Talk with your health care provider about your test results, treatment options, and if necessary, the need for  more tests. ?Vaccines  ?Your health care provider may recommend certain vaccines, such as: ?Influenza vaccine. This is recommended every year. ?Tetanus,  diphtheria, and acellular pertussis (Tdap, Td) vaccine. You may need a Td booster every 10 years. ?Zoster vaccine. You may need this after age 68. ?Pneumococcal 13-valent conjugate (PCV13) vaccine. One dose is recommended after age 22. ?Pneumococcal polysaccharide (PPSV23) vaccine. One dose is recommended after age 4. ?Talk to your health care provider about which screenings and vaccines you need and how often you need them. ?This information is not intended to replace advice given to you by your health care provider. Make sure you discuss any questions you have with your health care provider. ?Document Released: 09/10/2015 Document Revised: 05/03/2016 Document Reviewed: 06/15/2015 ?Elsevier Interactive Patient Education ? 2017 [place]. ? ?Fall Prevention in the Home ?Falls can cause injuries. They can happen to people of all ages. There are many things you can do to make your home safe and to help prevent falls. ?What can I do on the outside of my home? ?Regularly fix the edges of walkways and driveways and fix any cracks. ?Remove anything that might make you trip as you walk through a door, such as a raised step or threshold. ?Trim any bushes or trees on the path to your home. ?Use bright outdoor lighting. ?Clear any walking paths of anything that might make someone trip, such as rocks or tools. ?Regularly check to see if handrails are loose or broken. Make sure that both sides of any steps have handrails. ?Any raised decks and porches should have guardrails on the edges. ?Have any leaves, snow, or ice cleared regularly. ?Use sand or salt on walking paths during winter. ?Clean up any spills in your garage right away. This includes oil or grease spills. ?What can I do in the bathroom? ?Use night lights. ?Install grab bars by the toilet and in the tub and shower. Do not use towel bars as grab bars. ?Use non-skid mats or decals in the tub or shower. ?If you need to sit down in the shower, use a plastic,  non-slip stool. ?Keep the floor dry. Clean up any water that spills on the floor as soon as it happens. ?Remove soap buildup in the tub or shower regularly. ?Attach bath mats securely with double-sided non-slip rug tape. ?Do not have throw rugs and other things on the floor that can make you trip. ?What can I do in the bedroom? ?Use night lights. ?Make sure that you have a light by your bed that is easy to reach. ?Do not use any sheets or blankets that are too big for your bed. They should not hang down onto the floor. ?Have a firm chair that has side arms. You can use this for support while you get dressed. ?Do not have throw rugs and other things on the floor that can make you trip. ?What can I do in the kitchen? ?Clean up any spills right away. ?Avoid walking on wet floors. ?Keep items that you use a lot in easy-to-reach places. ?If you need to reach something above you, use a strong step stool that has a grab bar. ?Keep electrical cords out of the way. ?Do not use floor polish or wax that makes floors slippery. If you must use wax, use non-skid floor wax. ?Do not have throw rugs and other things on the floor that can make you trip. ?What can I do with my stairs? ?Do not leave any items on the stairs. ?Make  sure that there are handrails on both sides of the stairs and use them. Fix handrails that are broken or loose. Make sure that handrails are as long as the stairways. ?Check any carpeting to make sure that it is firmly attached to the stairs. Fix any carpet that is loose or worn. ?Avoid having throw rugs at the top or bottom of the stairs. If you do have throw rugs, attach them to the floor with carpet tape. ?Make sure that you have a light switch at the top of the stairs and the bottom of the stairs. If you do not have them, ask someone to add them for you. ?What else can I do to help prevent falls? ?Wear shoes that: ?Do not have high heels. ?Have rubber bottoms. ?Are comfortable and fit you well. ?Are closed  at the toe. Do not wear sandals. ?If you use a stepladder: ?Make sure that it is fully opened. Do not climb a closed stepladder. ?Make sure that both sides of the stepladder are locked into place. ?Ask someone to h

## 2021-12-21 DIAGNOSIS — M1711 Unilateral primary osteoarthritis, right knee: Secondary | ICD-10-CM | POA: Diagnosis not present

## 2022-01-03 ENCOUNTER — Other Ambulatory Visit: Payer: Self-pay | Admitting: Family

## 2022-01-03 DIAGNOSIS — F419 Anxiety disorder, unspecified: Secondary | ICD-10-CM

## 2022-01-03 NOTE — Telephone Encounter (Signed)
Requesting: xanax 0.25 mg ?Contract:unknown ?FMZ:[id number] ?Last Visit:09/30/21 ?Next Visit:unknown ?Last Refill:10/21/21 ? ?Please Advise  ?

## 2022-01-25 ENCOUNTER — Telehealth: Payer: Self-pay | Admitting: Family

## 2022-01-25 ENCOUNTER — Other Ambulatory Visit: Payer: Self-pay | Admitting: Family

## 2022-01-25 DIAGNOSIS — F32A Anxiety disorder, unspecified: Secondary | ICD-10-CM

## 2022-01-25 MED ORDER — ALPRAZOLAM 0.25 MG PO TABS: ORAL_TABLET | ORAL | 1 refills | Status: DC

## 2022-01-25 NOTE — Telephone Encounter (Signed)
Patient picked up medication at other pharmacy because they did rx in but other rx was canceled at other pharmacy.

## 2022-01-25 NOTE — Telephone Encounter (Signed)
Pt states she has been out of her xanax for quite some time due to it being on back order. She would like it sent to a different pharmacy.   [facility], [place], [place] [address]

## 2022-01-26 ENCOUNTER — Telehealth: Payer: Self-pay

## 2022-01-26 NOTE — Telephone Encounter (Signed)
   Pre-operative Risk Assessment    Patient Name: [name]  DOB: [birth year] MRN: [id number]      Request for Surgical Clearance    Procedure:   Right Total Knee replacement  Date of Surgery:  Clearance TBD                                 Surgeon:  Dr Edmonia Lynch Surgeon's Group or Practice Name:  [facility] Phone number:  [phone] Fax number:  [phone]   Type of Clearance Requested:   - Medical    Type of Anesthesia:  Not Indicated   Additional requests/questions:  Please fax a copy of surgical clearance  to the surgeon's office.  Signed, Jeanmarie Plant Zayin Valadez   01/26/2022, 3:32 PM

## 2022-01-26 NOTE — Telephone Encounter (Signed)
Please set up a virtual telephone visit for cardiac clearance.  Patient had a normal Myoview last year prior to left total knee surgery.  Now she required right knee total knee replacement.

## 2022-01-27 ENCOUNTER — Telehealth: Payer: Self-pay | Admitting: *Deleted

## 2022-01-27 NOTE — Telephone Encounter (Signed)
I s/w the pt and she is agreeable to plan of care for tele pre op appt 02/01/22 11 am per pt request. Med rec and consent are done.

## 2022-01-27 NOTE — Telephone Encounter (Signed)
I s/w the pt and she is agreeable to plan of care for tele pre op appt 02/01/22 11 am per pt request. Med rec and consent are done.     Patient Consent for Virtual Visit        CYLA HALUSKA has provided verbal consent on 01/27/2022 for a virtual visit (video or telephone).   CONSENT FOR VIRTUAL VISIT FOR:  Molinda Bailiff Tates  By participating in this virtual visit I agree to the following:  I hereby voluntarily request, consent and authorize [facility] and its employed or contracted physicians, physician assistants, nurse practitioners or other licensed health care professionals (the Practitioner), to provide me with telemedicine health care services (the "Services") as deemed necessary by the treating Practitioner. I acknowledge and consent to receive the Services by the Practitioner via telemedicine. I understand that the telemedicine visit will involve communicating with the Practitioner through live audiovisual communication technology and the disclosure of certain medical information by electronic transmission. I acknowledge that I have been given the opportunity to request an in-person assessment or other available alternative prior to the telemedicine visit and am voluntarily participating in the telemedicine visit.  I understand that I have the right to withhold or withdraw my consent to the use of telemedicine in the course of my care at any time, without affecting my right to future care or treatment, and that the Practitioner or I may terminate the telemedicine visit at any time. I understand that I have the right to inspect all information obtained and/or recorded in the course of the telemedicine visit and may receive copies of available information for a reasonable fee.  I understand that some of the potential risks of receiving the Services via telemedicine include:  Delay or interruption in medical evaluation due to technological equipment failure or disruption; Information transmitted  may not be sufficient (e.g. poor resolution of images) to allow for appropriate medical decision making by the Practitioner; and/or  In rare instances, security protocols could fail, causing a breach of personal health information.  Furthermore, I acknowledge that it is my responsibility to provide information about my medical history, conditions and care that is complete and accurate to the best of my ability. I acknowledge that Practitioner's advice, recommendations, and/or decision may be based on factors not within their control, such as incomplete or inaccurate data provided by me or distortions of diagnostic images or specimens that may result from electronic transmissions. I understand that the practice of medicine is not an exact science and that Practitioner makes no warranties or guarantees regarding treatment outcomes. I acknowledge that a copy of this consent can be made available to me via my patient portal ([facility]), or I can request a printed copy by calling the office of [facility].    I understand that my insurance will be billed for this visit.   I have read or had this consent read to me. I understand the contents of this consent, which adequately explains the benefits and risks of the Services being provided via telemedicine.  I have been provided ample opportunity to ask questions regarding this consent and the Services and have had my questions answered to my satisfaction. I give my informed consent for the services to be provided through the use of telemedicine in my medical care

## 2022-01-31 NOTE — Telephone Encounter (Signed)
Patient called in cancelling her tele pre op appt due to having an event for her grandson she will be attending that day. She is requesting a callback to reschedule.

## 2022-01-31 NOTE — Telephone Encounter (Signed)
Returned patient's called and rescheduled her for 6/15 at 10:40am with tele pre-op visit. Pt made me aware that medications have not changed since last med rec. Patient thanked me for the call.

## 2022-02-01 ENCOUNTER — Telehealth: Payer: Medicare Other

## 2022-02-02 ENCOUNTER — Telehealth: Payer: Self-pay | Admitting: Family

## 2022-02-02 NOTE — Telephone Encounter (Signed)
Received notes from [facility]; she needs OV to do clearance for upcoming visit; forms will be on Mei's desk.

## 2022-02-02 NOTE — Telephone Encounter (Signed)
Patient scheduled for tomorrow at 140pm.

## 2022-02-03 ENCOUNTER — Ambulatory Visit: Payer: Medicare Other | Admitting: Family

## 2022-02-09 ENCOUNTER — Encounter: Payer: Self-pay | Admitting: Nurse Practitioner

## 2022-02-09 ENCOUNTER — Ambulatory Visit (INDEPENDENT_AMBULATORY_CARE_PROVIDER_SITE_OTHER): Payer: Medicare Other | Admitting: Nurse Practitioner

## 2022-02-09 DIAGNOSIS — Z01810 Encounter for preprocedural cardiovascular examination: Secondary | ICD-10-CM | POA: Diagnosis not present

## 2022-02-09 NOTE — Progress Notes (Signed)
Virtual Visit via Telephone Note   Because of [name]'s co-morbid illnesses, she is at least at moderate risk for complications without adequate follow up.  This format is felt to be most appropriate for this patient at this time.  The patient did not have access to video technology/had technical difficulties with video requiring transitioning to audio format only (telephone).  All issues noted in this document were discussed and addressed.  No physical exam could be performed with this format.  Please refer to the patient's chart for her consent to telehealth for [facility].  Evaluation Performed:  Preoperative cardiovascular risk assessment _____________   Date:  02/09/2022   Patient ID:  [name], DOB Jul 02, 1946, MRN [id number] Patient Location:  Home Provider location:   Office  Primary Care Provider:  Marrian Salvage, [place] Primary Cardiologist:  Pixie Casino, MD  Chief Complaint / Patient Profile   76 y.o. y/o female with a h/o hypertension, hyperlipidemia, hypothyroidism, mitral valve prolapse, aortic atherosclerosis, who is pending right total knee replacement and presents today for telephonic preoperative cardiovascular risk assessment.  Past Medical History    Past Medical History:  Diagnosis Date   Adenomatous colon polyp    Arthritis    Cataract    Complication of anesthesia    Heart murmur    MVP   Hyperlipidemia    Hypertension    Hypothyroidism    PONV (postoperative nausea and vomiting)    Thyroid disease    Past Surgical History:  Procedure Laterality Date   BACK SURGERY     BREAST EXCISIONAL BIOPSY Right 1983   COLONOSCOPY W/ POLYPECTOMY  2003,2006,2013   [name]   DIAGNOSTIC LAPAROSCOPY  1979   exploratory to get pregnant   DILATION AND CURETTAGE OF UTERUS     X2   NEPHRECTOMY  2010   Left for 9.8cm benign complex cyst   THYROIDECTOMY  2000   [name]   TONSILLECTOMY     TOTAL ABDOMINAL HYSTERECTOMY W/ BILATERAL  SALPINGOOPHORECTOMY  2003   [name] for dysfunctional menses   TOTAL HIP ARTHROPLASTY Left 06/24/2019   Procedure: TOTAL HIP ARTHROPLASTY ANTERIOR APPROACH;  Surgeon: Renette Butters, MD;  Location: [facility];  Service: Orthopedics;  Laterality: Left;    Allergies  Allergies  Allergen Reactions   Atorvastatin     myalgias    Rosuvastatin Other (See Comments)    Leg & muscle weakness   Simvastatin     myalgias   Codeine Sulfate     "made me jumpy"   Erythromycin Nausea Only    History of Present Illness    [name] is a 76 y.o. female who presents via audio/video conferencing for a telehealth visit today.  Pt was last seen in cardiology clinic on 03/09/2021 by Almyra Deforest, PA.  At that time [name] was being evaluated for knee surgery and had new T wave inversions on EKG.  She underwent nuclear stress test which revealed no evidence of ischemia or infarction, read as a low risk study. The patient is now pending procedure as outlined above. Since her last visit, she  denies chest pain, shortness of breath, lower extremity edema, fatigue, palpitations, melena, hematuria, hemoptysis, diaphoresis, weakness, presyncope, syncope, orthopnea, and PND.    Home Medications    Prior to Admission medications   Medication Sig Start Date End Date Taking? Authorizing Provider  Acetaminophen (TYLENOL ARTHRITIS PAIN PO) Take by mouth.    [provider]  ALPRAZolam (XANAX) 0.25 MG tablet TAKE 2 TABLETS BY MOUTH AT BEDTIME AS NEEDED FOR ANXIETY 01/25/22   Marrian Salvage, FNP  amLODipine (NORVASC) 10 MG tablet TAKE 1 TABLET BY MOUTH EVERY DAY 10/31/21   Marrian Salvage, FNP  benazepril (LOTENSIN) 40 MG tablet TAKE 1 TABLET BY MOUTH EVERY DAY 12/06/21   Marrian Salvage, FNP  celecoxib (CELEBREX) 200 MG capsule TAKE 1 CAPSULE BY MOUTH EVERY DAY AS NEEDED 10/10/21   Marrian Salvage, FNP  ezetimibe (ZETIA) 10 MG tablet TAKE 1 TABLET BY MOUTH EVERY DAY 05/26/21    Hilty, Nadean Corwin, MD  gabapentin (NEURONTIN) 100 MG capsule Take 1 capsule (100 mg total) by mouth 2 (two) times daily. For pain. 12/02/19   Marrian Salvage, FNP  Levothyroxine Sodium 100 MCG CAPS Take 1 capsule (100 mcg total) by mouth daily at 6 (six) AM. 12/13/21   Shamleffer, Melanie Crazier, MD  metoprolol succinate (TOPROL-XL) 50 MG 24 hr tablet Take 1 tablet (50 mg total) by mouth daily. Take with or immediately following a meal. Patient taking differently: Take 25 mg by mouth daily. Take with or immediately following a meal. 07/04/21   Marrian Salvage, FNP  Multiple Vitamins-Minerals (CENTRUM PO) Take 1 tablet by mouth daily.    [provider]  NON FORMULARY Take 2,500 Units by mouth. daily    [provider]  potassium citrate (UROCIT-K) 10 MEQ (1080 MG) SR tablet Take 10 mEq by mouth daily.    [provider]  simvastatin (ZOCOR) 20 MG tablet Take 20 mg by mouth at bedtime.    11/10/11  [provider]    Physical Exam    Vital Signs:  [name] does not have vital signs available for review today.  Given telephonic nature of communication, physical exam is limited. AAOx3. NAD. Normal affect.  Speech and respirations are unlabored.  Accessory Clinical Findings    None  Assessment & Plan    1.  Preoperative Cardiovascular Risk Assessment: She is doing well from a cardiac perspective and may proceed to surgery without further testing. According to the Revised Cardiac Risk Index (RCRI), her Perioperative Risk of Major Cardiac Event is (%): 0.9. Her Functional Capacity in METs is: 7.59 according to the [place] Activity Status Index (DASI).  She is not on any antiplatelet or anticoagulant agents.   A copy of this note will be routed to requesting surgeon.  Time:   Today, I have spent 10 minutes with the patient with telehealth technology discussing medical history, symptoms, and management plan.     Emmaline Life, NP-C     02/09/2022, 10:47 AM [place] [address] Office [phone] Fax [phone]

## 2022-02-10 ENCOUNTER — Encounter: Payer: Self-pay | Admitting: Family

## 2022-02-10 ENCOUNTER — Ambulatory Visit (INDEPENDENT_AMBULATORY_CARE_PROVIDER_SITE_OTHER): Payer: Medicare Other | Admitting: Family

## 2022-02-10 VITALS — BP 140/80 | HR 105 | Resp 20 | Ht 65.0 in | Wt 156.0 lb

## 2022-02-10 DIAGNOSIS — G8929 Other chronic pain: Secondary | ICD-10-CM

## 2022-02-10 DIAGNOSIS — M25561 Pain in right knee: Secondary | ICD-10-CM | POA: Diagnosis not present

## 2022-02-10 NOTE — Progress Notes (Signed)
[name] is a 76 y.o. female with the following history as recorded in EpicCare:  Patient Active Problem List   Diagnosis Date Noted   Postoperative hypothyroidism 12/12/2021   Pituitary macroadenoma ([place]) 12/12/2021   Fatigue 12/12/2021   Eustachian tube dysfunction 01/12/2021   Left Achilles tendinitis 08/25/2020   Chronic hip pain after total replacement of left hip joint 06/15/2020   Primary localized osteoarthritis of hip 06/24/2019   Primary osteoarthritis of left hip 06/02/2019   S/P lumbar fusion 04/23/2019   Lumbar spinal stenosis 07/16/2018   Renal cyst 07/16/2018   Left lumbar radiculopathy 06/24/2018   Left hip pain 10/25/2017   Greater trochanteric bursitis of left hip 10/03/2017   Anxiety and depression 05/23/2017   Hyperlipidemia 04/21/2016   Routine general medical examination at a health care facility 01/05/2016   Medicare annual wellness visit, subsequent 01/05/2016   Allergic rhinitis 12/29/2015   Vaginal atrophy 10/22/2013   DJD (degenerative joint disease) of knee 10/18/2012   NEPHRECTOMY, HX OF 05/28/2009   Hypothyroidism, postsurgical 07/18/2007   HYPERLIPIDEMIA 07/18/2007   Essential hypertension 07/18/2007   OSTEOPENIA 07/15/2007   History of colonic polyps 07/15/2007    Current Outpatient Medications  Medication Sig Dispense Refill   Acetaminophen (TYLENOL ARTHRITIS PAIN PO) Take by mouth.     ALPRAZolam (XANAX) 0.25 MG tablet TAKE 2 TABLETS BY MOUTH AT BEDTIME AS NEEDED FOR ANXIETY 60 tablet 1   amLODipine (NORVASC) 10 MG tablet TAKE 1 TABLET BY MOUTH EVERY DAY 90 tablet 1   benazepril (LOTENSIN) 40 MG tablet TAKE 1 TABLET BY MOUTH EVERY DAY 90 tablet 3   Cholecalciferol (D3 ADULT PO) Take by mouth.     Cyanocobalamin (B-12 PO) Take 1,000 mg by mouth daily.     ezetimibe (ZETIA) 10 MG tablet TAKE 1 TABLET BY MOUTH EVERY DAY 90 tablet 3   gabapentin (NEURONTIN) 100 MG capsule Take 1 capsule (100 mg total) by mouth 2 (two) times daily. For pain.  180 capsule 3   Levothyroxine Sodium 100 MCG CAPS Take 1 capsule (100 mcg total) by mouth daily at 6 (six) AM. 90 capsule 3   metoprolol succinate (TOPROL-XL) 50 MG 24 hr tablet Take 1 tablet (50 mg total) by mouth daily. Take with or immediately following a meal. (Patient taking differently: Take 25 mg by mouth daily. Take with or immediately following a meal.) 90 tablet 3   Multiple Vitamins-Minerals (CENTRUM PO) Take 1 tablet by mouth daily.     NON FORMULARY Take 2,500 Units by mouth. daily     potassium citrate (UROCIT-K) 10 MEQ (1080 MG) SR tablet Take 10 mEq by mouth daily.     celecoxib (CELEBREX) 200 MG capsule TAKE 1 CAPSULE BY MOUTH EVERY DAY AS NEEDED (Patient not taking: Reported on 02/10/2022) 30 capsule 5   No current facility-administered medications for this visit.    Allergies: Atorvastatin, Rosuvastatin, Simvastatin, Codeine sulfate, and Erythromycin  Past Medical History:  Diagnosis Date   Adenomatous colon polyp    Arthritis    Cataract    Complication of anesthesia    Heart murmur    MVP   Hyperlipidemia    Hypertension    Hypothyroidism    PONV (postoperative nausea and vomiting)    Thyroid disease     Past Surgical History:  Procedure Laterality Date   BACK SURGERY     BREAST EXCISIONAL BIOPSY Right 1983   COLONOSCOPY W/ POLYPECTOMY  2003,2006,2013   [name]   DIAGNOSTIC LAPAROSCOPY  1979   exploratory to get pregnant   [place]     X2   NEPHRECTOMY  2010   Left for 9.8cm benign complex cyst   THYROIDECTOMY  2000   [name]   TONSILLECTOMY     TOTAL ABDOMINAL HYSTERECTOMY W/ BILATERAL SALPINGOOPHORECTOMY  2003   [name] for dysfunctional menses   TOTAL HIP ARTHROPLASTY Left 06/24/2019   Procedure: TOTAL HIP ARTHROPLASTY ANTERIOR APPROACH;  Surgeon: Renette Butters, MD;  Location: [facility];  Service: Orthopedics;  Laterality: Left;    Family History  Problem Relation Age of Onset   Transient ischemic attack Father         in 37s   Coronary artery disease Father        stents @ 42   Nephrolithiasis Father    Hyperlipidemia Mother    Transient ischemic attack Mother 33   Thyroid disease Mother        hyperthyroid   Urolithiasis Sister    Diabetes Neg Hx     Social History   Tobacco Use   Smoking status: Former    Packs/day: 0.50    Years: 4.00    Total pack years: 2.00    Types: Cigarettes    Quit date: 08/29/1975    Years since quitting: 46.4   Smokeless tobacco: Never   Tobacco comments:    smoked 1973-1977, up to 1/2 ppd  Substance Use Topics   Alcohol use: Yes    Alcohol/week: 0.0 standard drinks of alcohol    Comment: minimally  1 every 6 months    Subjective:  Will be meeting with her orthopedist next week to discuss right knee replacement; did see cardiologist earlier this week and was cleared for surgery;  Denies any acute concerns today;     Objective:  Vitals:   02/10/22 1351  BP: 140/80  Pulse: (!) 105  Resp: 20  SpO2: 94%  Weight: 156 lb (70.8 kg)  Height: '5\' 5"'$  (1.651 m)    General: Well developed, well nourished, in no acute distress  Skin : Warm and dry.  Head: Normocephalic and atraumatic  Eyes: Sclera and conjunctiva clear; pupils round and reactive to light; extraocular movements intact  Ears: External normal; canals clear; tympanic membranes normal  Oropharynx: Pink, supple. No suspicious lesions  Neck: Supple without thyromegaly, adenopathy  Lungs: Respirations unlabored; clear to auscultation bilaterally without wheeze, rales, rhonchi  CVS exam: normal rate and regular rhythm.  Neurologic: Alert and oriented; speech intact; face symmetrical; moves all extremities well; CNII-XII intact without focal deficit   Assessment:  1. Chronic pain of right knee     Plan:   Patient is cleared for surgery with no restrictions; paperwork is forwarded to orthopedist as requested;  She will follow up here in 6-9 months;     No follow-ups on file.  No orders of the  defined types were placed in this encounter.   Requested Prescriptions    No prescriptions requested or ordered in this encounter

## 2022-02-15 DIAGNOSIS — M1711 Unilateral primary osteoarthritis, right knee: Secondary | ICD-10-CM | POA: Diagnosis not present

## 2022-02-16 ENCOUNTER — Encounter: Payer: Self-pay | Admitting: Internal Medicine

## 2022-03-22 DIAGNOSIS — H43393 Other vitreous opacities, bilateral: Secondary | ICD-10-CM | POA: Diagnosis not present

## 2022-03-27 ENCOUNTER — Other Ambulatory Visit: Payer: Self-pay | Admitting: Family

## 2022-03-30 ENCOUNTER — Other Ambulatory Visit: Payer: Self-pay | Admitting: Family

## 2022-03-30 DIAGNOSIS — F419 Anxiety disorder, unspecified: Secondary | ICD-10-CM

## 2022-04-14 DIAGNOSIS — H53451 Other localized visual field defect, right eye: Secondary | ICD-10-CM | POA: Diagnosis not present

## 2022-04-17 DIAGNOSIS — Z01812 Encounter for preprocedural laboratory examination: Secondary | ICD-10-CM | POA: Diagnosis not present

## 2022-04-17 DIAGNOSIS — M25561 Pain in right knee: Secondary | ICD-10-CM | POA: Diagnosis not present

## 2022-04-17 DIAGNOSIS — M1711 Unilateral primary osteoarthritis, right knee: Secondary | ICD-10-CM | POA: Diagnosis not present

## 2022-04-27 DIAGNOSIS — M6281 Muscle weakness (generalized): Secondary | ICD-10-CM | POA: Diagnosis not present

## 2022-04-27 DIAGNOSIS — R269 Unspecified abnormalities of gait and mobility: Secondary | ICD-10-CM | POA: Diagnosis not present

## 2022-04-27 DIAGNOSIS — M1711 Unilateral primary osteoarthritis, right knee: Secondary | ICD-10-CM | POA: Diagnosis not present

## 2022-04-27 DIAGNOSIS — Z96652 Presence of left artificial knee joint: Secondary | ICD-10-CM | POA: Diagnosis not present

## 2022-04-28 ENCOUNTER — Other Ambulatory Visit: Payer: Self-pay | Admitting: Family

## 2022-05-04 DIAGNOSIS — G8918 Other acute postprocedural pain: Secondary | ICD-10-CM | POA: Diagnosis not present

## 2022-05-04 DIAGNOSIS — Z96651 Presence of right artificial knee joint: Secondary | ICD-10-CM | POA: Diagnosis not present

## 2022-05-04 DIAGNOSIS — M1711 Unilateral primary osteoarthritis, right knee: Secondary | ICD-10-CM | POA: Diagnosis not present

## 2022-05-08 DIAGNOSIS — Z96651 Presence of right artificial knee joint: Secondary | ICD-10-CM | POA: Diagnosis not present

## 2022-05-08 DIAGNOSIS — Z96652 Presence of left artificial knee joint: Secondary | ICD-10-CM | POA: Diagnosis not present

## 2022-05-08 DIAGNOSIS — M6281 Muscle weakness (generalized): Secondary | ICD-10-CM | POA: Diagnosis not present

## 2022-05-08 DIAGNOSIS — M1711 Unilateral primary osteoarthritis, right knee: Secondary | ICD-10-CM | POA: Diagnosis not present

## 2022-05-08 DIAGNOSIS — R269 Unspecified abnormalities of gait and mobility: Secondary | ICD-10-CM | POA: Diagnosis not present

## 2022-05-15 DIAGNOSIS — M1711 Unilateral primary osteoarthritis, right knee: Secondary | ICD-10-CM | POA: Diagnosis not present

## 2022-05-15 DIAGNOSIS — R269 Unspecified abnormalities of gait and mobility: Secondary | ICD-10-CM | POA: Diagnosis not present

## 2022-05-15 DIAGNOSIS — Z96652 Presence of left artificial knee joint: Secondary | ICD-10-CM | POA: Diagnosis not present

## 2022-05-15 DIAGNOSIS — M6281 Muscle weakness (generalized): Secondary | ICD-10-CM | POA: Diagnosis not present

## 2022-05-15 DIAGNOSIS — Z96651 Presence of right artificial knee joint: Secondary | ICD-10-CM | POA: Diagnosis not present

## 2022-05-17 DIAGNOSIS — M1711 Unilateral primary osteoarthritis, right knee: Secondary | ICD-10-CM | POA: Diagnosis not present

## 2022-05-23 ENCOUNTER — Other Ambulatory Visit: Payer: Self-pay | Admitting: Internal Medicine

## 2022-05-29 DIAGNOSIS — R269 Unspecified abnormalities of gait and mobility: Secondary | ICD-10-CM | POA: Diagnosis not present

## 2022-05-29 DIAGNOSIS — Z96652 Presence of left artificial knee joint: Secondary | ICD-10-CM | POA: Diagnosis not present

## 2022-05-29 DIAGNOSIS — M6281 Muscle weakness (generalized): Secondary | ICD-10-CM | POA: Diagnosis not present

## 2022-05-29 DIAGNOSIS — Z96651 Presence of right artificial knee joint: Secondary | ICD-10-CM | POA: Diagnosis not present

## 2022-05-29 DIAGNOSIS — M1711 Unilateral primary osteoarthritis, right knee: Secondary | ICD-10-CM | POA: Diagnosis not present

## 2022-05-31 DIAGNOSIS — M1711 Unilateral primary osteoarthritis, right knee: Secondary | ICD-10-CM | POA: Diagnosis not present

## 2022-06-06 ENCOUNTER — Telehealth: Payer: Self-pay | Admitting: Family

## 2022-06-06 NOTE — Telephone Encounter (Signed)
Pt had knee surgery and all the medication has messed up her stomach. She was advised by her surgeon to contact us to see if we could call something in to help. It started as constipation and now she is having diarrhea multiple times a day.   [id number]: [phone] Fax: [phone]

## 2022-06-06 NOTE — Telephone Encounter (Signed)
I have called the pt back to gather more information. She stated that she had knee surgery on Sept 7th and she is now having stomach issues. She stated that she started with the constipation and now she is having diarrhea. This has been going on for the last few weeks to a month. Pt reports that she take 4 extra strength tylenols for pain. She has also taken one tab of the OTC imodium '2mg'$  tabs.  Pt also reports that she is extremely weak and doesn't have an appetite. She takes zofran PRN.   Please advise if there is any recommendations in the mean time. I have offered the pt an appointment on  06/08/22 and she declined at this time; she will call her orthopedist back and give [place] a call back on their recommendation as well.

## 2022-06-12 ENCOUNTER — Encounter: Payer: Self-pay | Admitting: Family

## 2022-06-15 ENCOUNTER — Ambulatory Visit (INDEPENDENT_AMBULATORY_CARE_PROVIDER_SITE_OTHER): Payer: Medicare Other | Admitting: Family

## 2022-06-15 ENCOUNTER — Encounter: Payer: Self-pay | Admitting: Family

## 2022-06-15 VITALS — BP 122/64 | HR 95 | Temp 98.1°F | Ht 65.0 in | Wt 152.6 lb

## 2022-06-15 DIAGNOSIS — Z23 Encounter for immunization: Secondary | ICD-10-CM

## 2022-06-15 DIAGNOSIS — K5900 Constipation, unspecified: Secondary | ICD-10-CM

## 2022-06-15 DIAGNOSIS — E039 Hypothyroidism, unspecified: Secondary | ICD-10-CM | POA: Diagnosis not present

## 2022-06-15 NOTE — Progress Notes (Signed)
[name] is a 76 y.o. female with the following history as recorded in EpicCare:  Patient Active Problem List   Diagnosis Date Noted   Postoperative hypothyroidism 12/12/2021   Pituitary macroadenoma ([facility]) 12/12/2021   Fatigue 12/12/2021   Eustachian tube dysfunction 01/12/2021   Left Achilles tendinitis 08/25/2020   Chronic hip pain after total replacement of left hip joint 06/15/2020   Primary localized osteoarthritis of hip 06/24/2019   Primary osteoarthritis of left hip 06/02/2019   S/P lumbar fusion 04/23/2019   Lumbar spinal stenosis 07/16/2018   Renal cyst 07/16/2018   Left lumbar radiculopathy 06/24/2018   Left hip pain 10/25/2017   Greater trochanteric bursitis of left hip 10/03/2017   Anxiety and depression 05/23/2017   Hyperlipidemia 04/21/2016   Routine general medical examination at a health care facility 01/05/2016   Medicare annual wellness visit, subsequent 01/05/2016   Allergic rhinitis 12/29/2015   Vaginal atrophy 10/22/2013   DJD (degenerative joint disease) of knee 10/18/2012   NEPHRECTOMY, HX OF 05/28/2009   Hypothyroidism, postsurgical 07/18/2007   HYPERLIPIDEMIA 07/18/2007   Essential hypertension 07/18/2007   OSTEOPENIA 07/15/2007   History of colonic polyps 07/15/2007    Current Outpatient Medications  Medication Sig Dispense Refill   Acetaminophen (TYLENOL ARTHRITIS PAIN PO) Take by mouth.     ALPRAZolam (XANAX) 0.25 MG tablet TAKE 2 TABLETS BY MOUTH AT BEDTIME AS NEEDED FOR ANXIETY 60 tablet 1   amLODipine (NORVASC) 10 MG tablet TAKE 1 TABLET BY MOUTH EVERY DAY 90 tablet 1   benazepril (LOTENSIN) 40 MG tablet TAKE 1 TABLET BY MOUTH EVERY DAY 90 tablet 3   celecoxib (CELEBREX) 200 MG capsule TAKE 1 CAPSULE BY MOUTH EVERY DAY AS NEEDED 30 capsule 5   ezetimibe (ZETIA) 10 MG tablet TAKE 1 TABLET BY MOUTH EVERY DAY 90 tablet 3   Levothyroxine Sodium 100 MCG CAPS Take 1 capsule (100 mcg total) by mouth daily at 6 (six) AM. 90 capsule 3   metoprolol  succinate (TOPROL-XL) 50 MG 24 hr tablet Take 1 tablet (50 mg total) by mouth daily. Take with or immediately following a meal. (Patient taking differently: Take 25 mg by mouth daily. Take with or immediately following a meal.) 90 tablet 3   potassium citrate (UROCIT-K) 10 MEQ (1080 MG) SR tablet Take 10 mEq by mouth daily.     Cholecalciferol (D3 ADULT PO) Take by mouth. (Patient not taking: Reported on 06/15/2022)     Multiple Vitamins-Minerals (CENTRUM PO) Take 1 tablet by mouth daily. (Patient not taking: Reported on 06/15/2022)     NON FORMULARY Take 2,500 Units by mouth. daily (Patient not taking: Reported on 06/15/2022)     No current facility-administered medications for this visit.    Allergies: Atorvastatin, Rosuvastatin, Simvastatin, Codeine sulfate, and Erythromycin  Past Medical History:  Diagnosis Date   Adenomatous colon polyp    Arthritis    Cataract    Complication of anesthesia    Heart murmur    MVP   Hyperlipidemia    Hypertension    Hypothyroidism    PONV (postoperative nausea and vomiting)    Thyroid disease     Past Surgical History:  Procedure Laterality Date   BACK SURGERY     BREAST EXCISIONAL BIOPSY Right 1983   COLONOSCOPY W/ POLYPECTOMY  2003,2006,2013   [name]   DIAGNOSTIC LAPAROSCOPY  1979   exploratory to get pregnant   DILATION AND CURETTAGE OF UTERUS     X2   NEPHRECTOMY  2010     Left for 9.8cm benign complex cyst   THYROIDECTOMY  2000   Dr Abrams   TONSILLECTOMY     TOTAL ABDOMINAL HYSTERECTOMY W/ BILATERAL SALPINGOOPHORECTOMY  2003   [name] for dysfunctional menses   TOTAL HIP ARTHROPLASTY Left 06/24/2019   Procedure: TOTAL HIP ARTHROPLASTY ANTERIOR APPROACH;  Surgeon: Murphy, Timothy D, MD;  Location: [facility];  Service: Orthopedics;  Laterality: Left;    Family History  Problem Relation Age of Onset   Transient ischemic attack Father        in 80s   Coronary artery disease Father        stents @ 85   Nephrolithiasis Father     Hyperlipidemia Mother    Transient ischemic attack Mother 82   Thyroid disease Mother        hyperthyroid   Urolithiasis Sister    Diabetes Neg Hx     Social History   Tobacco Use   Smoking status: Former    Packs/day: 0.50    Years: 4.00    Total pack years: 2.00    Types: Cigarettes    Quit date: 08/29/1975    Years since quitting: 46.8   Smokeless tobacco: Never   Tobacco comments:    smoked 1973-1977, up to 1/2 ppd  Substance Use Topics   Alcohol use: Yes    Alcohol/week: 0.0 standard drinks of alcohol    Comment: minimally  1 every 6 months    Subjective:   Had knee replacement surgery in early September; has been having complications with her bowel habits alternating between constipation and diarrhea since having surgery; has had decreased appetite but does feel that things are slowly improving. Would like to get labs to "make sure everything is okay."       Objective:  Vitals:   06/15/22 1333  BP: 122/64  Pulse: 95  Temp: 98.1 F (36.7 C)  TempSrc: Oral  SpO2: 96%  Weight: 152 lb 9.6 oz (69.2 kg)  Height: 5' 5" (1.651 m)    General: Well developed, well nourished, in no acute distress  Skin : Warm and dry.  Head: Normocephalic and atraumatic  Lungs: Respirations unlabored; clear to auscultation bilaterally without wheeze, rales, rhonchi  CVS exam: normal rate and regular rhythm.  Musculoskeletal: No deformities; no active joint inflammation  Extremities: No edema, cyanosis, clubbing  Vessels: Symmetric bilaterally  Neurologic: Alert and oriented; speech intact; face symmetrical; moves all extremities well; CNII-XII intact without focal deficit   Assessment:  1. Constipation, unspecified constipation type   2. Need for immunization against influenza   3. Hypothyroidism, unspecified type     Plan:  Encouraged to stop taking Immodium; encouraged to start eating regularly and try taking regular Miralax regimen to help "re-set" bowels; suspect  complications from pain medication;  Check CBC, CMP, TSH today;  Reassurance that she can take small amount of Celebrex as needed for chronic neck pain ( may try taking 2-3 x per week and okay to alternate with Tylenol Arthritis);   Time spent 30 minutes  No follow-ups on file.  Orders Placed This Encounter  Procedures   Flu Vaccine QUAD High Dose(Fluad)   CBC with Differential/Platelet   Comp Met (CMET)   TSH    Requested Prescriptions    No prescriptions requested or ordered in this encounter     

## 2022-06-16 LAB — COMPREHENSIVE METABOLIC PANEL
ALT: 12 U/L (ref 0–35)
AST: 18 U/L (ref 0–37)
Albumin: 4.5 g/dL (ref 3.5–5.2)
Alkaline Phosphatase: 160 U/L — ABNORMAL HIGH (ref 39–117)
BUN: 13 mg/dL (ref 6–23)
CO2: 25 mEq/L (ref 19–32)
Calcium: 9.9 mg/dL (ref 8.4–10.5)
Chloride: 102 mEq/L (ref 96–112)
Creatinine, Ser: 0.93 mg/dL (ref 0.40–1.20)
GFR: 59.75 mL/min — ABNORMAL LOW (ref >60.00)
Glucose, Bld: 107 mg/dL — ABNORMAL HIGH (ref 70–99)
Potassium: 4.3 mEq/L (ref 3.5–5.1)
Sodium: 141 mEq/L (ref 135–145)
Total Bilirubin: 0.5 mg/dL (ref 0.2–1.2)
Total Protein: 7.3 g/dL (ref 6.0–8.3)

## 2022-06-16 LAB — CBC WITH DIFFERENTIAL/PLATELET
Basophils Absolute: 0.1 10*3/uL (ref 0.0–0.1)
Basophils Relative: 0.9 % (ref 0.0–3.0)
Eosinophils Absolute: 0.2 10*3/uL (ref 0.0–0.7)
Eosinophils Relative: 2.8 % (ref 0.0–5.0)
HCT: 43.5 % (ref 36.0–46.0)
Hemoglobin: 14.3 g/dL (ref 12.0–15.0)
Lymphocytes Relative: 14 % (ref 12.0–46.0)
Lymphs Abs: 1.2 10*3/uL (ref 0.7–4.0)
MCHC: 33 g/dL (ref 30.0–36.0)
MCV: 95 fl (ref 78.0–100.0)
Monocytes Absolute: 0.9 10*3/uL (ref 0.1–1.0)
Monocytes Relative: 10.3 % (ref 3.0–12.0)
Neutro Abs: 6.1 10*3/uL (ref 1.4–7.7)
Neutrophils Relative %: 72 % (ref 43.0–77.0)
Platelets: 372 10*3/uL (ref 150.0–400.0)
RBC: 4.58 Mil/uL (ref 3.87–5.11)
RDW: 13.2 % (ref 11.5–15.5)
WBC: 8.5 10*3/uL (ref 4.0–10.5)

## 2022-06-16 LAB — TSH: TSH: 0.75 u[IU]/mL (ref 0.35–5.50)

## 2022-06-17 ENCOUNTER — Encounter: Payer: Self-pay | Admitting: Family

## 2022-06-19 ENCOUNTER — Other Ambulatory Visit: Payer: Self-pay | Admitting: Family

## 2022-06-19 ENCOUNTER — Encounter: Payer: Self-pay | Admitting: *Deleted

## 2022-06-19 DIAGNOSIS — F419 Anxiety disorder, unspecified: Secondary | ICD-10-CM

## 2022-06-19 NOTE — Progress Notes (Signed)
Discussed with patient over MyChart; repeat labs in 2-3 months.

## 2022-06-19 NOTE — Telephone Encounter (Signed)
Patient is requesting a refill of the following medications: Requested Prescriptions   Pending Prescriptions Disp Refills   ALPRAZolam (XANAX) 0.25 MG tablet [Pharmacy Med Name: ALPRAZOLAM 0.25 MG TABLET] 60 tablet 1    Sig: TAKE 2 TABLETS BY MOUTH AT BEDTIME AS NEEDED FOR ANXIETY   Last Visit: 06/15/22 Last fill: 03/31/22 Next Visit: none Last Refill amount: #60 + 1  Please Advise

## 2022-06-20 ENCOUNTER — Ambulatory Visit: Payer: Medicare Other | Admitting: Internal Medicine

## 2022-06-20 ENCOUNTER — Other Ambulatory Visit: Payer: Self-pay | Admitting: Family

## 2022-06-20 DIAGNOSIS — R899 Unspecified abnormal finding in specimens from other organs, systems and tissues: Secondary | ICD-10-CM

## 2022-08-01 DIAGNOSIS — N200 Calculus of kidney: Secondary | ICD-10-CM | POA: Diagnosis not present

## 2022-08-08 DIAGNOSIS — N200 Calculus of kidney: Secondary | ICD-10-CM | POA: Diagnosis not present

## 2022-08-18 ENCOUNTER — Other Ambulatory Visit: Payer: Self-pay | Admitting: Family

## 2022-08-18 DIAGNOSIS — I10 Essential (primary) hypertension: Secondary | ICD-10-CM

## 2022-08-23 ENCOUNTER — Other Ambulatory Visit: Payer: Self-pay | Admitting: Family

## 2022-08-23 DIAGNOSIS — F419 Anxiety disorder, unspecified: Secondary | ICD-10-CM

## 2022-08-24 NOTE — Telephone Encounter (Signed)
Requesting: alprazolam 0.'25mg'$   Contract: None UDS: None Last Visit: 06/15/22 Next Visit: None Last Refill: 06/20/22 #60 and 1RF   Please Advise

## 2022-09-04 ENCOUNTER — Telehealth: Payer: Self-pay | Admitting: Family

## 2022-09-04 NOTE — Telephone Encounter (Signed)
-----   Message from Marrian Salvage, [place] sent at 06/15/2022  2:13 PM EDT ----- Remind due for colonoscopy

## 2022-09-04 NOTE — Telephone Encounter (Signed)
I had gotten a note as a reminder that she is overdue for colonoscopy. Is she okay if I put in referral? They often don't do colonoscopy after 75 but with history of polyps I am going to let Dr. Fuller Plan make that decision if she's okay with that.

## 2022-09-04 NOTE — Telephone Encounter (Signed)
-----   Message from Marrian Salvage, [place] sent at 06/15/2022  2:13 PM EDT ----- Remind due for colonoscopy

## 2022-09-05 NOTE — Telephone Encounter (Signed)
Spoke with Pt she stated it is okay to go ahead and send out the referral.

## 2022-09-07 DIAGNOSIS — L570 Actinic keratosis: Secondary | ICD-10-CM | POA: Diagnosis not present

## 2022-09-07 DIAGNOSIS — D2261 Melanocytic nevi of right upper limb, including shoulder: Secondary | ICD-10-CM | POA: Diagnosis not present

## 2022-09-07 DIAGNOSIS — D225 Melanocytic nevi of trunk: Secondary | ICD-10-CM | POA: Diagnosis not present

## 2022-09-07 DIAGNOSIS — L821 Other seborrheic keratosis: Secondary | ICD-10-CM | POA: Diagnosis not present

## 2022-09-07 DIAGNOSIS — D692 Other nonthrombocytopenic purpura: Secondary | ICD-10-CM | POA: Diagnosis not present

## 2022-09-07 DIAGNOSIS — D485 Neoplasm of uncertain behavior of skin: Secondary | ICD-10-CM | POA: Diagnosis not present

## 2022-09-07 DIAGNOSIS — D224 Melanocytic nevi of scalp and neck: Secondary | ICD-10-CM | POA: Diagnosis not present

## 2022-09-07 DIAGNOSIS — D0471 Carcinoma in situ of skin of right lower limb, including hip: Secondary | ICD-10-CM | POA: Diagnosis not present

## 2022-09-07 DIAGNOSIS — D2239 Melanocytic nevi of other parts of face: Secondary | ICD-10-CM | POA: Diagnosis not present

## 2022-09-07 DIAGNOSIS — Z85828 Personal history of other malignant neoplasm of skin: Secondary | ICD-10-CM | POA: Diagnosis not present

## 2022-09-07 DIAGNOSIS — D045 Carcinoma in situ of skin of trunk: Secondary | ICD-10-CM | POA: Diagnosis not present

## 2022-09-07 DIAGNOSIS — L723 Sebaceous cyst: Secondary | ICD-10-CM | POA: Diagnosis not present

## 2022-10-16 ENCOUNTER — Other Ambulatory Visit: Payer: Self-pay | Admitting: Family

## 2022-10-16 ENCOUNTER — Telehealth: Payer: Self-pay | Admitting: Gastroenterology

## 2022-10-16 DIAGNOSIS — Z1231 Encounter for screening mammogram for malignant neoplasm of breast: Secondary | ICD-10-CM

## 2022-10-16 NOTE — Telephone Encounter (Signed)
PT was recalled to have a colonoscopy in 2018 and never did. Should she be scheduled or will she need an OV. Please advise.

## 2022-10-17 ENCOUNTER — Other Ambulatory Visit: Payer: Self-pay | Admitting: Family

## 2022-10-17 ENCOUNTER — Other Ambulatory Visit (INDEPENDENT_AMBULATORY_CARE_PROVIDER_SITE_OTHER): Payer: Medicare Other

## 2022-10-17 ENCOUNTER — Encounter: Payer: Self-pay | Admitting: Family

## 2022-10-17 ENCOUNTER — Encounter: Payer: Self-pay | Admitting: Gastroenterology

## 2022-10-17 DIAGNOSIS — R899 Unspecified abnormal finding in specimens from other organs, systems and tissues: Secondary | ICD-10-CM

## 2022-10-17 DIAGNOSIS — R748 Abnormal levels of other serum enzymes: Secondary | ICD-10-CM

## 2022-10-17 LAB — COMPREHENSIVE METABOLIC PANEL
ALT: 13 U/L (ref 0–35)
AST: 18 U/L (ref 0–37)
Albumin: 4.2 g/dL (ref 3.5–5.2)
Alkaline Phosphatase: 152 U/L — ABNORMAL HIGH (ref 39–117)
BUN: 18 mg/dL (ref 6–23)
CO2: 25 mEq/L (ref 19–32)
Calcium: 9.5 mg/dL (ref 8.4–10.5)
Chloride: 105 mEq/L (ref 96–112)
Creatinine, Ser: 0.88 mg/dL (ref 0.40–1.20)
GFR: 63.69 mL/min (ref >60.00)
Glucose, Bld: 96 mg/dL (ref 70–99)
Potassium: 4 mEq/L (ref 3.5–5.1)
Sodium: 140 mEq/L (ref 135–145)
Total Bilirubin: 0.5 mg/dL (ref 0.2–1.2)
Total Protein: 7.3 g/dL (ref 6.0–8.3)

## 2022-10-17 NOTE — Telephone Encounter (Signed)
Can you please call the pt and set her up an office visit.  She will need to be seen prior to making an appt for procedure? Thank you

## 2022-10-25 ENCOUNTER — Other Ambulatory Visit: Payer: Self-pay | Admitting: Family

## 2022-10-26 ENCOUNTER — Other Ambulatory Visit: Payer: Self-pay | Admitting: Family

## 2022-10-26 DIAGNOSIS — F419 Anxiety disorder, unspecified: Secondary | ICD-10-CM

## 2022-10-26 LAB — ALKALINE PHOSPHATASE, ISOENZYMES
Alkaline Phosphatase: 173 IU/L — ABNORMAL HIGH (ref 44–121)
BONE FRACTION: 72 % — ABNORMAL HIGH (ref 14–68)
INTESTINAL FRAC.: 0 % (ref 0–18)
LIVER FRACTION: 28 % (ref 18–85)

## 2022-10-26 NOTE — Telephone Encounter (Signed)
Requesting: alprazolam 0.'25mg'$   Contract: None UDS: None Last Visit: 06/15/22 Next Visit: None Last Refill: 08/24/22 #60 and 1RF   Please Advise

## 2022-10-27 ENCOUNTER — Other Ambulatory Visit: Payer: Self-pay | Admitting: Family

## 2022-10-27 DIAGNOSIS — R899 Unspecified abnormal finding in specimens from other organs, systems and tissues: Secondary | ICD-10-CM

## 2022-11-01 DIAGNOSIS — G9389 Other specified disorders of brain: Secondary | ICD-10-CM | POA: Diagnosis not present

## 2022-11-01 DIAGNOSIS — E236 Other disorders of pituitary gland: Secondary | ICD-10-CM | POA: Diagnosis not present

## 2022-11-07 DIAGNOSIS — E236 Other disorders of pituitary gland: Secondary | ICD-10-CM | POA: Diagnosis not present

## 2022-11-23 ENCOUNTER — Other Ambulatory Visit: Payer: Self-pay | Admitting: Family

## 2022-11-29 ENCOUNTER — Ambulatory Visit
Admission: RE | Admit: 2022-11-29 | Discharge: 2022-11-29 | Disposition: A | Discharge status: Home / Self Care | Payer: Medicare Other | Source: Ambulatory Visit | Attending: Family | Admitting: Family

## 2022-11-29 DIAGNOSIS — Z1231 Encounter for screening mammogram for malignant neoplasm of breast: Secondary | ICD-10-CM

## 2022-12-03 ENCOUNTER — Other Ambulatory Visit: Payer: Self-pay | Admitting: Family

## 2022-12-11 ENCOUNTER — Telehealth: Payer: Self-pay | Admitting: Family

## 2022-12-11 ENCOUNTER — Encounter: Payer: Medicare Other | Admitting: Gastroenterology

## 2022-12-11 NOTE — Telephone Encounter (Signed)
Contacted [name] to schedule their annual wellness visit. Appointment made for 12/27/2022.  Verlee Rossetti; Care Guide Ambulatory Clinical Support [place] [facility] Direct Dial: [phone]

## 2022-12-12 ENCOUNTER — Telehealth: Payer: Self-pay | Admitting: Family

## 2022-12-12 NOTE — Telephone Encounter (Signed)
Please remind her we had discussed getting her lab for Alk Phosphatase level re-checked at [facility]; order is in place- can go at her convenience.

## 2022-12-12 NOTE — Telephone Encounter (Signed)
-----   Message from Olive Bass, FNP sent at 10/27/2022  3:09 PM EST ----- Make sure she has gone to [place] for labs by mid-April;

## 2022-12-13 NOTE — Telephone Encounter (Signed)
Spoke with pt, pt states she plans on going to [place] lab 12/14/2022.

## 2022-12-14 ENCOUNTER — Other Ambulatory Visit: Payer: Medicare Other

## 2022-12-14 DIAGNOSIS — R899 Unspecified abnormal finding in specimens from other organs, systems and tissues: Secondary | ICD-10-CM | POA: Diagnosis not present

## 2022-12-18 ENCOUNTER — Encounter: Payer: Self-pay | Admitting: Family

## 2022-12-20 ENCOUNTER — Encounter: Payer: Self-pay | Admitting: Gastroenterology

## 2022-12-20 ENCOUNTER — Ambulatory Visit: Payer: Medicare Other | Admitting: Gastroenterology

## 2022-12-20 ENCOUNTER — Other Ambulatory Visit: Payer: Self-pay | Admitting: Family

## 2022-12-20 VITALS — BP 136/76 | HR 88 | Ht 65.0 in | Wt 154.0 lb

## 2022-12-20 DIAGNOSIS — R7989 Other specified abnormal findings of blood chemistry: Secondary | ICD-10-CM

## 2022-12-20 DIAGNOSIS — Z86010 Personal history of colonic polyps: Secondary | ICD-10-CM | POA: Diagnosis not present

## 2022-12-20 LAB — ALKALINE PHOSPHATASE, ISOENZYMES
Alkaline Phosphatase: 192 IU/L — ABNORMAL HIGH (ref 44–121)
BONE FRACTION: 68 % (ref 14–68)
INTESTINAL FRAC.: 0 % (ref 0–18)
LIVER FRACTION: 32 % (ref 18–85)

## 2022-12-20 MED ORDER — NA SULFATE-K SULFATE-MG SULF 17.5-3.13-1.6 GM/177ML PO SOLN: 1.0000 | Freq: Once | ORAL | 0 refills | Status: AC

## 2022-12-20 NOTE — Patient Instructions (Signed)
You have been scheduled for a colonoscopy. Please follow written instructions given to you at your visit today.  Please pick up your prep supplies at the pharmacy within the next 1-3 days. If you use inhalers (even only as needed), please bring them with you on the day of your procedure.  The [facility] providers would like to encourage you to use MYCHART to communicate with providers for non-urgent requests or questions.  Due to long hold times on the telephone, sending your provider a message by MYCHART may be a faster and more efficient way to get a response.  Please allow 48 business hours for a response.  Please remember that this is for non-urgent requests.   Due to recent changes in healthcare laws, you may see the results of your imaging and laboratory studies on MyChart before your provider has had a chance to review them.  We understand that in some cases there may be results that are confusing or concerning to you. Not all laboratory results come back in the same time frame and the provider may be waiting for multiple results in order to interpret others.  Please give us 48 hours in order for your provider to thoroughly review all the results before contacting the office for clarification of your results.   Thank you for choosing me and [facility].  Malcolm T. Stark, Jr., MD., FACG  

## 2022-12-20 NOTE — Progress Notes (Signed)
Assessment    Personal history of adenomatous colon polyps Elevated alk phos with normal LFTs and an elevated alk phos bone fraction    Recommendations   Schedule colonoscopy. The risks (including bleeding, perforation, infection, missed lesions, medication reactions and possible hospitalization or surgery if complications occur), benefits, and alternatives to colonoscopy with possible biopsy and possible polypectomy were discussed with the patient and they consent to proceed.   Return to PCP further evaluation of alk phos elevation.   HPI   Chief complaint: personal history of adenomatous colon polyps  Patient profile:  LEANE LORING is a 77 y.o. female referred by Olive Bass,* FNP for personal history of adenomatous colon polyps.  Her last colonoscopy was in May 2013 for personal history of adenomatous colon polyps and 1 small adenomatous colon polyp was removed.  She has no ongoing gastrointestinal complaints.  Recently her alkaline phosphatase has been elevated with other LFTs normal.  Her PCP obtained isoenzymes which showed an elevated bone fraction.  She has no history of liver disease. Denies weight loss, abdominal pain, constipation, diarrhea, change in stool caliber, melena, hematochezia, nausea, vomiting, dysphagia, reflux symptoms, chest pain.   Previous Labs / Imaging::    Latest Ref Rng & Units 06/15/2022    2:37 PM 09/30/2021    2:14 PM 01/26/2021   11:49 AM  CBC  WBC 4.0 - 10.5 K/uL 8.5  8.9  6.7   Hemoglobin 12.0 - 15.0 g/dL 16.1  09.6  04.5   Hematocrit 36.0 - 46.0 % 43.5  45.4  42.4   Platelets 150.0 - 400.0 K/uL 372.0  330  277.0     No results found for: "LIPASE"    Latest Ref Rng & Units 12/14/2022    9:52 AM 10/17/2022    4:07 PM 10/17/2022   10:21 AM  CMP  Glucose 70 - 99 mg/dL   96   BUN 6 - 23 mg/dL   18   Creatinine 4.09 - 1.20 mg/dL   8.11   Sodium 914 - 782 mEq/L   140   Potassium 3.5 - 5.1 mEq/L   4.0   Chloride 96 - 112 mEq/L    105   CO2 19 - 32 mEq/L   25   Calcium 8.4 - 10.5 mg/dL   9.5   Total Protein 6.0 - 8.3 g/dL   7.3   Total Bilirubin 0.2 - 1.2 mg/dL   0.5   Alkaline Phos 44 - 121 IU/L 192  173  152   AST 0 - 37 U/L   18   ALT 0 - 35 U/L   13      Previous GI evaluation    Endoscopies:  See HPI  Imaging:     Past Medical History:  Diagnosis Date   Adenomatous colon polyp    Arthritis    Cataract    Complication of anesthesia    Heart murmur    MVP   Hyperlipidemia    Hypertension    Hypothyroidism    PONV (postoperative nausea and vomiting)    Thyroid disease    Past Surgical History:  Procedure Laterality Date   BACK SURGERY     BREAST EXCISIONAL BIOPSY Right 1983   COLONOSCOPY W/ POLYPECTOMY  2003,2006,2013   Dr.Aidin Doane   DIAGNOSTIC LAPAROSCOPY  1979   exploratory to get pregnant   DILATION AND CURETTAGE OF UTERUS     X2   NEPHRECTOMY  2010   Left for 9.8cm benign  complex cyst   THYROIDECTOMY  2000   Dr Orpah Greek   TONSILLECTOMY     TOTAL ABDOMINAL HYSTERECTOMY W/ BILATERAL SALPINGOOPHORECTOMY  2003   [name] for dysfunctional menses   TOTAL HIP ARTHROPLASTY Left 06/24/2019   Procedure: TOTAL HIP ARTHROPLASTY ANTERIOR APPROACH;  Surgeon: Sheral Apley, MD;  Location: [facility];  Service: Orthopedics;  Laterality: Left;   Family History  Problem Relation Age of Onset   Transient ischemic attack Father        in 24s   Coronary artery disease Father        stents @ 74   Nephrolithiasis Father    Hyperlipidemia Mother    Transient ischemic attack Mother 45   Thyroid disease Mother        hyperthyroid   Urolithiasis Sister    Diabetes Neg Hx    Social History   Tobacco Use   Smoking status: Former    Packs/day: 0.50    Years: 4.00    Additional pack years: 0.00    Total pack years: 2.00    Types: Cigarettes    Quit date: 08/29/1975    Years since quitting: 47.3   Smokeless tobacco: Never   Tobacco comments:    smoked 1973-1977, up to 1/2 ppd  Vaping Use    Vaping Use: Never used  Substance Use Topics   Alcohol use: Not Currently   Drug use: No   Current Outpatient Medications  Medication Sig Dispense Refill   Acetaminophen (TYLENOL ARTHRITIS PAIN PO) Take by mouth.     ALPRAZolam (XANAX) 0.25 MG tablet TAKE 2 TABLETS BY MOUTH AT BEDTIME AS NEEDED FOR ANXIETY 60 tablet 1   amLODipine (NORVASC) 10 MG tablet TAKE 1 TABLET BY MOUTH EVERY DAY 90 tablet 1   benazepril (LOTENSIN) 40 MG tablet TAKE 1 TABLET BY MOUTH EVERY DAY 90 tablet 0   celecoxib (CELEBREX) 200 MG capsule TAKE 1 CAPSULE BY MOUTH EVERY DAY AS NEEDED 30 capsule 5   Cholecalciferol (D3 ADULT PO) Take by mouth.     ezetimibe (ZETIA) 10 MG tablet TAKE 1 TABLET BY MOUTH EVERY DAY 90 tablet 3   Levothyroxine Sodium 100 MCG CAPS Take 1 capsule (100 mcg total) by mouth daily at 6 (six) AM. 90 capsule 3   metoprolol succinate (TOPROL-XL) 50 MG 24 hr tablet TAKE 1 TABLET BY MOUTH DAILY. TAKE WITH OR IMMEDIATELY FOLLOWING A MEAL. 90 tablet 3   Multiple Vitamins-Minerals (CENTRUM PO) Take 1 tablet by mouth daily.     NON FORMULARY Take 2,500 Units by mouth. daily     potassium citrate (UROCIT-K) 10 MEQ (1080 MG) SR tablet Take 10 mEq by mouth daily.     No current facility-administered medications for this visit.   Allergies  Allergen Reactions   Atorvastatin     myalgias    Rosuvastatin Other (See Comments)    Leg & muscle weakness   Simvastatin     myalgias   Codeine Sulfate     "made me jumpy"   Erythromycin Nausea Only    Review of Systems: All other systems reviewed and negative except where noted in HPI.    Physical Exam    Wt Readings from Last 3 Encounters:  12/20/22 154 lb (69.9 kg)  06/15/22 152 lb 9.6 oz (69.2 kg)  02/10/22 156 lb (70.8 kg)    BP 136/76   Pulse 88   Ht  (1.651 m)   Wt 154 lb (69.9 kg)   BMI 25.63  kg/m  Constitutional:  Generally well appearing female in no acute distress. HEENT: Pupils normal.  Conjunctivae are normal. No scleral  icterus. No oral lesions or deformities noted.  Neck: Supple.  Cardiac: Normal rate, regular rhythm without murmurs. Pulmonary/chest: Effort normal and breath sounds normal. No wheezing, rales or rhonchi. Abdominal: Soft, nondistended, nontender. Active bowel sounds. No palpable HSM, masses or hernias. Rectal: Deferred to colonoscopy Extremities: No edema or deformities noted Neurological: Alert and oriented to person, place and time. Psychiatric: Pleasant. Normal mood and affect. Behavior is normal. Skin: Skin is warm and dry. No rashes noted.  Claudette Head, MD   cc:  Referring Provider Olive Bass,*

## 2022-12-26 ENCOUNTER — Other Ambulatory Visit: Payer: Self-pay | Admitting: Family

## 2022-12-26 DIAGNOSIS — F32A Depression, unspecified: Secondary | ICD-10-CM

## 2022-12-27 ENCOUNTER — Ambulatory Visit (INDEPENDENT_AMBULATORY_CARE_PROVIDER_SITE_OTHER): Payer: Medicare Other | Admitting: *Deleted

## 2022-12-27 VITALS — Ht 65.0 in | Wt 154.0 lb

## 2022-12-27 DIAGNOSIS — Z0000 Encounter for general adult medical examination without abnormal findings: Secondary | ICD-10-CM

## 2022-12-27 NOTE — Patient Instructions (Signed)
[name] , Thank you for taking time to come for your Medicare Wellness Visit. I appreciate your ongoing commitment to your health goals. Please review the following plan we discussed and let me know if I can assist you in the future.     This is a list of the screening recommended for you and due dates:  Health Maintenance  Topic Date Due   Zoster (Shingles) Vaccine (1 of 2) Never done   Colon Cancer Screening  01/03/2017   COVID-19 Vaccine (4 - 2023-24 season) 04/28/2022   DTaP/Tdap/Td vaccine (3 - Td or Tdap) 02/06/2023   Flu Shot  03/29/2023   Medicare Annual Wellness Visit  12/27/2023   Pneumonia Vaccine  Completed   DEXA scan (bone density measurement)  Completed   Hepatitis C Screening: USPSTF Recommendation to screen - Ages 29-79 yo.  Completed   HPV Vaccine  Aged Out    Next appointment: Follow up in one year for your annual wellness visit.   Preventive Care 77 Years and Older, Female Preventive care refers to lifestyle choices and visits with your health care provider that can promote health and wellness. What does preventive care include? A yearly physical exam. This is also called an annual well check. Dental exams once or twice a year. Routine eye exams. Ask your health care provider how often you should have your eyes checked. Personal lifestyle choices, including: Daily care of your teeth and gums. Regular physical activity. Eating a healthy diet. Avoiding tobacco and drug use. Limiting alcohol use. Practicing safe sex. Taking low-dose aspirin every day. Taking vitamin and mineral supplements as recommended by your health care provider. What happens during an annual well check? The services and screenings done by your health care provider during your annual well check will depend on your age, overall health, lifestyle risk factors, and family history of disease. Counseling  Your health care provider may ask you questions about your: Alcohol use. Tobacco  use. Drug use. Emotional well-being. Home and relationship well-being. Sexual activity. Eating habits. History of falls. Memory and ability to understand (cognition). Work and work Astronomer. Reproductive health. Screening  You may have the following tests or measurements: Height, weight, and BMI. Blood pressure. Lipid and cholesterol levels. These may be checked every 5 years, or more frequently if you are over 19 years old. Skin check. Lung cancer screening. You may have this screening every year starting at age 79 if you have a 30-pack-year history of smoking and currently smoke or have quit within the past 15 years. Fecal occult blood test (FOBT) of the stool. You may have this test every year starting at age 29. Flexible sigmoidoscopy or colonoscopy. You may have a sigmoidoscopy every 5 years or a colonoscopy every 10 years starting at age 35. Hepatitis C blood test. Hepatitis B blood test. Sexually transmitted disease (STD) testing. Diabetes screening. This is done by checking your blood sugar (glucose) after you have not eaten for a while (fasting). You may have this done every 1-3 years. Bone density scan. This is done to screen for osteoporosis. You may have this done starting at age 23. Mammogram. This may be done every 1-2 years. Talk to your health care provider about how often you should have regular mammograms. Talk with your health care provider about your test results, treatment options, and if necessary, the need for more tests. Vaccines  Your health care provider may recommend certain vaccines, such as: Influenza vaccine. This is recommended every year. Tetanus, diphtheria,  and acellular pertussis (Tdap, Td) vaccine. You may need a Td booster every 10 years. Zoster vaccine. You may need this after age 26. Pneumococcal 13-valent conjugate (PCV13) vaccine. One dose is recommended after age 10. Pneumococcal polysaccharide (PPSV23) vaccine. One dose is recommended  after age 19. Talk to your health care provider about which screenings and vaccines you need and how often you need them. This information is not intended to replace advice given to you by your health care provider. Make sure you discuss any questions you have with your health care provider. Document Released: 09/10/2015 Document Revised: 05/03/2016 Document Reviewed: 06/15/2015 Elsevier Interactive Patient Education  2017 [employer].  Fall Prevention in the Home Falls can cause injuries. They can happen to people of all ages. There are many things you can do to make your home safe and to help prevent falls. What can I do on the outside of my home? Regularly fix the edges of walkways and driveways and fix any cracks. Remove anything that might make you trip as you walk through a door, such as a raised step or threshold. Trim any bushes or trees on the path to your home. Use bright outdoor lighting. Clear any walking paths of anything that might make someone trip, such as rocks or tools. Regularly check to see if handrails are loose or broken. Make sure that both sides of any steps have handrails. Any raised decks and porches should have guardrails on the edges. Have any leaves, snow, or ice cleared regularly. Use sand or salt on walking paths during winter. Clean up any spills in your garage right away. This includes oil or grease spills. What can I do in the bathroom? Use night lights. Install grab bars by the toilet and in the tub and shower. Do not use towel bars as grab bars. Use non-skid mats or decals in the tub or shower. If you need to sit down in the shower, use a plastic, non-slip stool. Keep the floor dry. Clean up any water that spills on the floor as soon as it happens. Remove soap buildup in the tub or shower regularly. Attach bath mats securely with double-sided non-slip rug tape. Do not have throw rugs and other things on the floor that can make you trip. What can I do  in the bedroom? Use night lights. Make sure that you have a light by your bed that is easy to reach. Do not use any sheets or blankets that are too big for your bed. They should not hang down onto the floor. Have a firm chair that has side arms. You can use this for support while you get dressed. Do not have throw rugs and other things on the floor that can make you trip. What can I do in the kitchen? Clean up any spills right away. Avoid walking on wet floors. Keep items that you use a lot in easy-to-reach places. If you need to reach something above you, use a strong step stool that has a grab bar. Keep electrical cords out of the way. Do not use floor polish or wax that makes floors slippery. If you must use wax, use non-skid floor wax. Do not have throw rugs and other things on the floor that can make you trip. What can I do with my stairs? Do not leave any items on the stairs. Make sure that there are handrails on both sides of the stairs and use them. Fix handrails that are broken or loose. Make sure  that handrails are as long as the stairways. Check any carpeting to make sure that it is firmly attached to the stairs. Fix any carpet that is loose or worn. Avoid having throw rugs at the top or bottom of the stairs. If you do have throw rugs, attach them to the floor with carpet tape. Make sure that you have a light switch at the top of the stairs and the bottom of the stairs. If you do not have them, ask someone to add them for you. What else can I do to help prevent falls? Wear shoes that: Do not have high heels. Have rubber bottoms. Are comfortable and fit you well. Are closed at the toe. Do not wear sandals. If you use a stepladder: Make sure that it is fully opened. Do not climb a closed stepladder. Make sure that both sides of the stepladder are locked into place. Ask someone to hold it for you, if possible. Clearly mark and make sure that you can see: Any grab bars or  handrails. First and last steps. Where the edge of each step is. Use tools that help you move around (mobility aids) if they are needed. These include: Canes. Walkers. Scooters. Crutches. Turn on the lights when you go into a dark area. Replace any light bulbs as soon as they burn out. Set up your furniture so you have a clear path. Avoid moving your furniture around. If any of your floors are uneven, fix them. If there are any pets around you, be aware of where they are. Review your medicines with your doctor. Some medicines can make you feel dizzy. This can increase your chance of falling. Ask your doctor what other things that you can do to help prevent falls. This information is not intended to replace advice given to you by your health care provider. Make sure you discuss any questions you have with your health care provider. Document Released: 06/10/2009 Document Revised: 01/20/2016 Document Reviewed: 09/18/2014 Elsevier Interactive Patient Education  2017 [employer].

## 2022-12-27 NOTE — Progress Notes (Signed)
Subjective:  Pt completed ADLs, Fall risk, and SDOH during e-check in on 12/20/22. Answers verified with pt.    [name] is a 77 y.o. female who presents for Medicare Annual (Subsequent) preventive examination.  I connected with  [name] on 12/27/22 by a audio enabled telemedicine application and verified that I am speaking with the correct person using two identifiers.  Patient Location: Home  Provider Location: Office/Clinic  I discussed the limitations of evaluation and management by telemedicine. The patient expressed understanding and agreed to proceed.   Review of Systems     Cardiac Risk Factors include: advanced age (>25men, >40 women);dyslipidemia;hypertension     Objective:    Today's Vitals   12/27/22 1301  Weight: 154 lb (69.9 kg)  Height: 5\' 5"  (1.651 m)   Body mass index is 25.63 kg/m.     12/27/2022    1:03 PM 12/20/2021   12:51 PM 06/24/2019   10:35 AM 06/16/2019   10:26 AM 04/23/2019   12:30 PM 04/21/2019    2:46 PM 02/21/2019    2:45 PM  Advanced Directives  Does Patient Have a Medical Advance Directive? No No No No No No No  Would patient like information on creating a medical advance directive? No - Patient declined Yes (MAU/Ambulatory/Procedural Areas - Information given) No - Patient declined No - Patient declined No - Patient declined No - Patient declined Yes (ED - Information included in AVS)    Current Medications (verified) Outpatient Encounter Medications as of 12/27/2022  Medication Sig   Acetaminophen (TYLENOL ARTHRITIS PAIN PO) Take by mouth.   ALPRAZolam (XANAX) 0.25 MG tablet TAKE 2 TABLETS BY MOUTH AT BEDTIME AS NEEDED FOR ANXIETY   amLODipine (NORVASC) 10 MG tablet TAKE 1 TABLET BY MOUTH EVERY DAY   benazepril (LOTENSIN) 40 MG tablet TAKE 1 TABLET BY MOUTH EVERY DAY   celecoxib (CELEBREX) 200 MG capsule TAKE 1 CAPSULE BY MOUTH EVERY DAY AS NEEDED   Cholecalciferol (D3 ADULT PO) Take by mouth.   ezetimibe (ZETIA) 10 MG tablet  TAKE 1 TABLET BY MOUTH EVERY DAY   Levothyroxine Sodium 100 MCG CAPS Take 1 capsule (100 mcg total) by mouth daily at 6 (six) AM.   metoprolol succinate (TOPROL-XL) 50 MG 24 hr tablet TAKE 1 TABLET BY MOUTH DAILY. TAKE WITH OR IMMEDIATELY FOLLOWING A MEAL.   Multiple Vitamins-Minerals (CENTRUM PO) Take 1 tablet by mouth daily.   NON FORMULARY Take 2,500 Units by mouth. daily   potassium citrate (UROCIT-K) 10 MEQ (1080 MG) SR tablet Take 10 mEq by mouth daily.   [DISCONTINUED] simvastatin (ZOCOR) 20 MG tablet Take 20 mg by mouth at bedtime.     No facility-administered encounter medications on file as of 12/27/2022.    Allergies (verified) Atorvastatin, Rosuvastatin, Simvastatin, Codeine sulfate, and Erythromycin   History: Past Medical History:  Diagnosis Date   Adenomatous colon polyp    Allergy Years ago   Anxiety Years ago   Arthritis    Cataract    Chronic kidney disease    Left kidney removed   Complication of anesthesia    Depression Years ago   Heart murmur    MVP   Hyperlipidemia    Hypertension    Hypothyroidism    PONV (postoperative nausea and vomiting)    Sleep apnea Last few years   Get up 2 times a night to go to bathroom   Thyroid disease    Past Surgical History:  Procedure Laterality Date  ABDOMINAL HYSTERECTOMY     APPENDECTOMY     BACK SURGERY     BREAST EXCISIONAL BIOPSY Right 1983   COLONOSCOPY W/ POLYPECTOMY  2003,2006,2013   [name]   DIAGNOSTIC LAPAROSCOPY  1979   exploratory to get pregnant   DILATION AND CURETTAGE OF UTERUS     X2   EYE SURGERY     JOINT REPLACEMENT     NEPHRECTOMY  2010   Left for 9.8cm benign complex cyst   SPINE SURGERY     THYROIDECTOMY  2000   Dr Orpah Greek   TONSILLECTOMY     TOTAL ABDOMINAL HYSTERECTOMY W/ BILATERAL SALPINGOOPHORECTOMY  2003   [name] for dysfunctional menses   TOTAL HIP ARTHROPLASTY Left 06/24/2019   Procedure: TOTAL HIP ARTHROPLASTY ANTERIOR APPROACH;  Surgeon: Sheral Apley, MD;  Location:  [facility];  Service: Orthopedics;  Laterality: Left;   TUBAL LIGATION     Family History  Problem Relation Age of Onset   Transient ischemic attack Father        in 62s   Coronary artery disease Father        stents @ 69   Nephrolithiasis Father    Hyperlipidemia Mother    Transient ischemic attack Mother 91   Thyroid disease Mother        hyperthyroid   ADD / ADHD Mother    Heart disease Mother    Urolithiasis Sister    Diabetes Neg Hx    Social History   Socioeconomic History   Marital status: Married    Spouse name: Not on file   Number of children: 2   Years of education: Not on file   Highest education level: Not on file  Occupational History   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   Occupation: retired  Tobacco Use   Smoking status: Former    Packs/day: 0.50    Years: 4.00    Additional pack years: 0.00    Total pack years: 2.00    Types: Cigarettes    Quit date: 08/29/1975    Years since quitting: 47.3   Smokeless tobacco: Never   Tobacco comments:    smoked 1973-1977, up to 1/2 ppd  Vaping Use   Vaping Use: Never used  Substance and Sexual Activity   Alcohol use: Not Currently   Drug use: No   Sexual activity: Yes    Comment: HYST-1st intercourse 18 yo-5 partners  Other Topics Concern   Not on file  Social History Narrative   Fun/Hobbies: Grandkids   Social Determinants of Health   Financial Resource Strain: Low Risk  (12/20/2022)   Overall Financial Resource Strain (CARDIA)    Difficulty of Paying Living Expenses: Not hard at all  Food Insecurity: No Food Insecurity (12/20/2022)   Hunger Vital Sign    Worried About Running Out of Food in the Last Year: Never true    Ran Out of Food in the Last Year: Never true  Transportation Needs: No Transportation Needs (12/20/2022)   PRAPARE - Administrator, Civil Service (Medical): No    Lack of Transportation (Non-Medical): No  Physical Activity: Patient Declined (12/20/2022)   Exercise Vital Sign    Days of  Exercise per Week: Patient declined    Minutes of Exercise per Session: Patient declined  Stress: No Stress Concern Present (12/20/2022)   [employer] of Occupational Health - Occupational Stress Questionnaire    Feeling of Stress : Only a little  Social Connections: Unknown (12/20/2022)   Social Connection  and Isolation Panel [NHANES]    Frequency of Communication with Friends and Family: More than three times a week    Frequency of Social Gatherings with Friends and Family: More than three times a week    Attends Religious Services: Not on Marketing executive or Organizations: Yes    Attends Banker Meetings: More than 4 times per year    Marital Status: Married    Tobacco Counseling Counseling given: Not Answered Tobacco comments: smoked [phone], up to 1/2 ppd   Clinical Intake:  Pre-visit preparation completed: Yes  Pain : No/denies pain  BMI - recorded: 25.63 Nutritional Status: BMI 25 -29 Overweight Nutritional Risks: None Diabetes: No  How often do you need to have someone help you when you read instructions, pamphlets, or other written materials from your doctor or pharmacy?: 1 - Never   Activities of Daily Living    12/20/2022    6:30 PM  In your present state of health, do you have any difficulty performing the following activities:  Hearing? 0  Vision? 0  Difficulty concentrating or making decisions? 0  Walking or climbing stairs? 0  Dressing or bathing? 0  Doing errands, shopping? 0  Preparing Food and eating ? N  Using the Toilet? N  In the past six months, have you accidently leaked urine? N  Do you have problems with loss of bowel control? N  Managing your Medications? N  Managing your Finances? N  Housekeeping or managing your Housekeeping? N    Patient Care Team: Olive Bass, FNP as PCP - General (Internal Medicine) Rennis Golden Lisette Abu, MD as PCP - Cardiology (Cardiology) Judi Saa, DO as Consulting  Physician (Family Medicine) Jethro Bolus, MD as Consulting Physician (Ophthalmology)  Indicate any recent Medical Services you may have received from other than [facility] providers in the past year (date may be approximate).     Assessment:   This is a routine wellness examination for [place].  Hearing/Vision screen No results found.  Dietary issues and exercise activities discussed: Current Exercise Habits: The patient does not participate in regular exercise at present, Exercise limited by: orthopedic condition(s)   Goals Addressed   None    Depression Screen    12/27/2022    1:05 PM 06/15/2022    1:37 PM 02/10/2022    1:54 PM 02/10/2022    1:50 PM 12/20/2021   12:49 PM 09/30/2021    1:29 PM 04/08/2021    8:40 AM  PHQ 2/9 Scores  PHQ - 2 Score 0 1 0 0 0 1 2  PHQ- 9 Score   4    8    Fall Risk    12/20/2022    6:30 PM 06/15/2022    1:36 PM 02/10/2022    1:50 PM 12/20/2021   12:53 PM 09/30/2021    1:29 PM  Fall Risk   Falls in the past year? 0 0 0 0 0  Number falls in past yr: 0 0 0 0 0  Injury with Fall? 0 0 0 0 0  Risk for fall due to : No Fall Risks No Fall Risks No Fall Risks Impaired vision;Impaired balance/gait No Fall Risks  Follow up Falls evaluation completed Falls evaluation completed Falls evaluation completed Falls prevention discussed Falls evaluation completed    FALL RISK PREVENTION PERTAINING TO THE HOME:  Any stairs in or around the home? Yes  If so, are there any without handrails? No  Home free  of loose throw rugs in walkways, pet beds, electrical cords, etc? Yes  Adequate lighting in your home to reduce risk of falls? Yes   ASSISTIVE DEVICES UTILIZED TO PREVENT FALLS:  Life alert? No  Use of a cane, walker or w/c? No  Grab bars in the bathroom? No  Shower chair or bench in shower?  Has chair but doesn't need it Elevated toilet seat or a handicapped toilet? Yes   TIMED UP AND GO:  Was the test performed?  No, audio visit .    Cognitive Function:         12/27/2022    1:10 PM 12/20/2021   12:55 PM  6CIT Screen  What Year? 0 points 0 points  What month? 0 points 0 points  What time? 0 points 0 points  Count back from 20 0 points 0 points  Months in reverse 0 points 0 points  Repeat phrase 0 points 0 points  Total Score 0 points 0 points    Immunizations Immunization History  Administered Date(s) Administered   Fluad Quad(high Dose 65+) 05/24/2020, 06/15/2022   Influenza Split 06/21/2011, 06/12/2012   Influenza Whole 07/11/2007, 06/10/2008, 06/29/2010   Influenza, High Dose Seasonal PF 06/18/2013, 04/21/2016, 05/23/2017, 05/17/2018, 05/27/2019   Influenza,inj,Quad PF,6+ Mos 06/10/2014, 05/07/2015   Influenza-Unspecified 05/29/2019   Moderna Sars-Covid-2 Vaccination 09/20/2019, 10/18/2019, 06/21/2020   Pneumococcal Conjugate-13 11/17/2015   Pneumococcal Polysaccharide-23 09/17/2013   Td 01/05/2003   Tdap 02/05/2013   Zoster, Live 11/10/2011    TDAP status: Up to date  Flu Vaccine status: Completed at today's visit  Pneumococcal vaccine status: Up to date  Covid-19 vaccine status: Information provided on how to obtain vaccines.   Qualifies for Shingles Vaccine? Yes   Zostavax completed Yes   Shingrix Completed?: No.    Education has been provided regarding the importance of this vaccine. Patient has been advised to call insurance company to determine out of pocket expense if they have not yet received this vaccine. Advised may also receive vaccine at local pharmacy or Health Dept. Verbalized acceptance and understanding.  Screening Tests Health Maintenance  Topic Date Due   Zoster Vaccines- Shingrix (1 of 2) Never done   COLONOSCOPY (Pts 45-68yrs Insurance coverage will need to be confirmed)  01/03/2017   COVID-19 Vaccine (4 - 2023-24 season) 04/28/2022   Medicare Annual Wellness (AWV)  12/21/2022   DTaP/Tdap/Td (3 - Td or Tdap) 02/06/2023   INFLUENZA VACCINE  03/29/2023   Pneumonia Vaccine 4+ Years old   Completed   DEXA SCAN  Completed   Hepatitis C Screening  Completed   HPV VACCINES  Aged Out    Health Maintenance  Health Maintenance Due  Topic Date Due   Zoster Vaccines- Shingrix (1 of 2) Never done   COLONOSCOPY (Pts 45-59yrs Insurance coverage will need to be confirmed)  01/03/2017   COVID-19 Vaccine (4 - 2023-24 season) 04/28/2022   Medicare Annual Wellness (AWV)  12/21/2022    Colorectal cancer screening: Type of screening: Colonoscopy. Completed 01/04/12. Repeat every 5 years.  Pt scheduled for 01/16/23.  Mammogram status: Completed 11/29/22. Repeat every year  Bone Density status: Completed 06/01/20. Results reflect: Bone density results: OSTEOPENIA. Repeat every 2 years.  Lung Cancer Screening: (Low Dose CT Chest recommended if Age 57-80 years, 30 pack-year currently smoking OR have quit w/in 15years.) does not qualify.   Additional Screening:  Hepatitis C Screening: does qualify; Completed 04/12/16  Vision Screening: Recommended annual ophthalmology exams for early detection of glaucoma and other  disorders of the eye. Is the patient up to date with their annual eye exam?  Yes  Who is the provider or what is the name of the office in which the patient attends annual eye exams? Dr. Hyacinth Meeker If pt is not established with a provider, would they like to be referred to a provider to establish care? No .   Dental Screening: Recommended annual dental exams for proper oral hygiene  Community Resource Referral / Chronic Care Management: CRR required this visit?  No   CCM required this visit?  No      Plan:     I have personally reviewed and noted the following in the patient's chart:   Medical and social history Use of alcohol, tobacco or illicit drugs  Current medications and supplements including opioid prescriptions. Patient is not currently taking opioid prescriptions. Functional ability and status Nutritional status Physical activity Advanced directives List of  other physicians Hospitalizations, surgeries, and ER visits in previous 12 months Vitals Screenings to include cognitive, depression, and falls Referrals and appointments  In addition, I have reviewed and discussed with patient certain preventive protocols, quality metrics, and best practice recommendations. A written personalized care plan for preventive services as well as general preventive health recommendations were provided to patient.   Due to this being a telephonic visit, the after visit summary with patients personalized plan was offered to patient via mail or my-chart. Patient would like to access on my-chart.  Donne Anon, [place]   12/27/2022   Nurse Notes: None

## 2023-01-08 ENCOUNTER — Encounter: Payer: Self-pay | Admitting: Gastroenterology

## 2023-01-13 ENCOUNTER — Encounter: Payer: Self-pay | Admitting: Certified Registered Nurse Anesthetist

## 2023-01-16 ENCOUNTER — Encounter: Payer: Self-pay | Admitting: Gastroenterology

## 2023-01-16 ENCOUNTER — Ambulatory Visit (AMBULATORY_SURGERY_CENTER): Payer: Medicare Other | Admitting: Gastroenterology

## 2023-01-16 VITALS — BP 144/75 | HR 78 | Temp 98.2°F | Resp 12 | Ht 65.0 in | Wt 154.0 lb

## 2023-01-16 DIAGNOSIS — I10 Essential (primary) hypertension: Secondary | ICD-10-CM | POA: Diagnosis not present

## 2023-01-16 DIAGNOSIS — D120 Benign neoplasm of cecum: Secondary | ICD-10-CM | POA: Diagnosis not present

## 2023-01-16 DIAGNOSIS — Z09 Encounter for follow-up examination after completed treatment for conditions other than malignant neoplasm: Secondary | ICD-10-CM | POA: Diagnosis not present

## 2023-01-16 DIAGNOSIS — D124 Benign neoplasm of descending colon: Secondary | ICD-10-CM

## 2023-01-16 DIAGNOSIS — Z86010 Personal history of colonic polyps: Secondary | ICD-10-CM

## 2023-01-16 MED ORDER — SODIUM CHLORIDE 0.9 % IV SOLN
500.0000 mL | INTRAVENOUS | Status: DC
Start: 2023-01-16 — End: 2023-01-16

## 2023-01-16 NOTE — Patient Instructions (Signed)
Please read handouts provided. Continue present medications. Await pathology results. High Fiber Diet.   YOU HAD AN ENDOSCOPIC PROCEDURE TODAY AT THE [facility]:   Refer to the procedure report that was given to you for any specific questions about what was found during the examination.  If the procedure report does not answer your questions, please call your gastroenterologist to clarify.  If you requested that your care partner not be given the details of your procedure findings, then the procedure report has been included in a sealed envelope for you to review at your convenience later.  YOU SHOULD EXPECT: Some feelings of bloating in the abdomen. Passage of more gas than usual.  Walking can help get rid of the air that was put into your GI tract during the procedure and reduce the bloating. If you had a lower endoscopy (such as a colonoscopy or flexible sigmoidoscopy) you may notice spotting of blood in your stool or on the toilet paper. If you underwent a bowel prep for your procedure, you may not have a normal bowel movement for a few days.  Please Note:  You might notice some irritation and congestion in your nose or some drainage.  This is from the oxygen used during your procedure.  There is no need for concern and it should clear up in a day or so.  SYMPTOMS TO REPORT IMMEDIATELY:  Following lower endoscopy (colonoscopy or flexible sigmoidoscopy):  Excessive amounts of blood in the stool  Significant tenderness or worsening of abdominal pains  Swelling of the abdomen that is new, acute  Fever of 100F or higher.  For urgent or emergent issues, a gastroenterologist can be reached at any hour by calling [phone]. Do not use MyChart messaging for urgent concerns.    DIET:  We do recommend a small meal at first, but then you may proceed to your regular diet.  Drink plenty of fluids but you should avoid alcoholic beverages for 24 hours.  ACTIVITY:  You should plan  to take it easy for the rest of today and you should NOT DRIVE or use heavy machinery until tomorrow (because of the sedation medicines used during the test).    FOLLOW UP: Our staff will call the number listed on your records the next business day following your procedure.  We will call around 7:15- 8:00 am to check on you and address any questions or concerns that you may have regarding the information given to you following your procedure. If we do not reach you, we will leave a message.     If any biopsies were taken you will be contacted by phone or by letter within the next 1-3 weeks.  Please call us at [phone] if you have not heard about the biopsies in 3 weeks.    SIGNATURES/CONFIDENTIALITY: You and/or your care partner have signed paperwork which will be entered into your electronic medical record.  These signatures attest to the fact that that the information above on your After Visit Summary has been reviewed and is understood.  Full responsibility of the confidentiality of this discharge information lies with you and/or your care-partner.  

## 2023-01-16 NOTE — Progress Notes (Signed)
See 12/20/2022 H&P, no changes 

## 2023-01-16 NOTE — Progress Notes (Signed)
Called to room to assist during endoscopic procedure.  Patient ID and intended procedure confirmed with present staff. Received instructions for my participation in the procedure from the performing physician.  

## 2023-01-16 NOTE — Progress Notes (Signed)
Report given to PACU, vss 

## 2023-01-16 NOTE — Op Note (Signed)
[facility] Patient Name: [name] Procedure Date: 01/16/2023 2:10 PM MRN: [id number] Endoscopist: Meryl Dare , MD, [phone] Age: 77 Referring MD:  Date of Birth: Feb 28, 1946 Gender: Female Account #: [id number] Procedure:                Colonoscopy Indications:              Surveillance: Personal history of adenomatous                            polyps on last colonoscopy > 5 years ago Medicines:                Monitored Anesthesia Care Procedure:                Pre-Anesthesia Assessment:                           - Prior to the procedure, a History and Physical                            was performed, and patient medications and                            allergies were reviewed. The patient's tolerance of                            previous anesthesia was also reviewed. The risks                            and benefits of the procedure and the sedation                            options and risks were discussed with the patient.                            All questions were answered, and informed consent                            was obtained. Prior Anticoagulants: The patient has                            taken no anticoagulant or antiplatelet agents. ASA                            Grade Assessment: II - A patient with mild systemic                            disease. After reviewing the risks and benefits,                            the patient was deemed in satisfactory condition to                            undergo the procedure.  After obtaining informed consent, the colonoscope                            was passed under direct vision. Throughout the                            procedure, the patient's blood pressure, pulse, and                            oxygen saturations were monitored continuously. The                            CF [id number] #[id number] was introduced through the anus                            and advanced to the  the cecum, identified by                            appendiceal orifice and ileocecal valve. The                            ileocecal valve, appendiceal orifice, and rectum                            were photographed. The quality of the bowel                            preparation was adequate after extensive lavage,                            suction. The colonoscopy was performed without                            difficulty. The patient tolerated the procedure                            well. Scope In: 2:11:40 PM Scope Out: 2:26:12 PM Scope Withdrawal Time: 0 hours 11 minutes 51 seconds  Total Procedure Duration: 0 hours 14 minutes 32 seconds  Findings:                 The perianal and digital rectal examinations were                            normal.                           Two sessile polyps were found in the descending                            colon and cecum. The polyps were 7 mm in size.                            These polyps were removed with a cold snare.  Resection and retrieval were complete.                           A few small-mouthed diverticula were found in the                            left colon.                           Internal hemorrhoids were found during                            retroflexion. The hemorrhoids were small and Grade                            I (internal hemorrhoids that do not prolapse).                           The exam was otherwise without abnormality on                            direct and retroflexion views. Complications:            No immediate complications. Estimated blood loss:                            None. Estimated Blood Loss:     Estimated blood loss: none. Impression:               - Two 7 mm polyps in the descending colon and in                            the cecum, removed with a cold snare. Resected and                            retrieved.                           - Mild diverticulosis  in the left colon.                           - Internal hemorrhoids.                           - The examination was otherwise normal on direct                            and retroflexion views. Recommendation:           - Patient has a contact number available for                            emergencies. The signs and symptoms of potential                            delayed complications were discussed with the  patient. Return to normal activities tomorrow.                            Written discharge instructions were provided to the                            patient.                           - High fiber diet.                           - Continue present medications.                           - Await pathology results.                           - No repeat colonoscopy due to age. Meryl Dare, MD 01/16/2023 2:33:08 PM This report has been signed electronically.

## 2023-01-17 ENCOUNTER — Telehealth: Payer: Self-pay | Admitting: *Deleted

## 2023-01-17 NOTE — Telephone Encounter (Signed)
  Follow up Call-     01/16/2023    1:03 PM  Call back number  Post procedure Call Back phone  # [phone]  Permission to leave phone message Yes     Patient questions:  Do you have a fever, pain , or abdominal swelling? No. Pain Score  0 *  Have you tolerated food without any problems? Yes.    Have you been able to return to your normal activities? Yes.    Do you have any questions about your discharge instructions: Diet   No. Medications  No. Follow up visit  No.  Do you have questions or concerns about your Care? No.  Actions: * If pain score is 4 or above: No action needed, pain <4.

## 2023-01-19 ENCOUNTER — Ambulatory Visit
Admission: RE | Admit: 2023-01-19 | Discharge: 2023-01-19 | Disposition: A | Discharge status: Home / Self Care | Payer: Medicare Other | Source: Ambulatory Visit | Attending: Family | Admitting: Family

## 2023-01-19 DIAGNOSIS — N200 Calculus of kidney: Secondary | ICD-10-CM | POA: Diagnosis not present

## 2023-01-19 DIAGNOSIS — K760 Fatty (change of) liver, not elsewhere classified: Secondary | ICD-10-CM | POA: Diagnosis not present

## 2023-01-19 DIAGNOSIS — R7989 Other specified abnormal findings of blood chemistry: Secondary | ICD-10-CM

## 2023-01-19 DIAGNOSIS — K8020 Calculus of gallbladder without cholecystitis without obstruction: Secondary | ICD-10-CM | POA: Diagnosis not present

## 2023-01-23 ENCOUNTER — Other Ambulatory Visit: Payer: Self-pay | Admitting: Family

## 2023-01-23 DIAGNOSIS — R7989 Other specified abnormal findings of blood chemistry: Secondary | ICD-10-CM

## 2023-01-23 DIAGNOSIS — K8020 Calculus of gallbladder without cholecystitis without obstruction: Secondary | ICD-10-CM

## 2023-02-01 ENCOUNTER — Encounter: Payer: Self-pay | Admitting: Gastroenterology

## 2023-02-07 DIAGNOSIS — K8020 Calculus of gallbladder without cholecystitis without obstruction: Secondary | ICD-10-CM | POA: Diagnosis not present

## 2023-03-03 ENCOUNTER — Other Ambulatory Visit: Payer: Self-pay | Admitting: Internal Medicine

## 2023-03-03 ENCOUNTER — Other Ambulatory Visit: Payer: Self-pay | Admitting: Family

## 2023-03-03 DIAGNOSIS — F419 Anxiety disorder, unspecified: Secondary | ICD-10-CM

## 2023-03-03 DIAGNOSIS — E890 Postprocedural hypothyroidism: Secondary | ICD-10-CM

## 2023-03-05 ENCOUNTER — Other Ambulatory Visit: Payer: Self-pay | Admitting: Family

## 2023-03-05 NOTE — Telephone Encounter (Signed)
Requesting: Xanax Contract: N/A UDS: N/A Last Visit: 06/15/2022  Next Visit: N/A Last Refill: 12/26/2022  Please Advise

## 2023-03-27 DIAGNOSIS — H43393 Other vitreous opacities, bilateral: Secondary | ICD-10-CM | POA: Diagnosis not present

## 2023-03-27 DIAGNOSIS — H524 Presbyopia: Secondary | ICD-10-CM | POA: Diagnosis not present

## 2023-03-28 ENCOUNTER — Encounter (INDEPENDENT_AMBULATORY_CARE_PROVIDER_SITE_OTHER): Payer: Self-pay

## 2023-03-30 ENCOUNTER — Encounter: Payer: Self-pay | Admitting: Family

## 2023-04-02 ENCOUNTER — Other Ambulatory Visit: Payer: Self-pay | Admitting: Family

## 2023-04-02 ENCOUNTER — Other Ambulatory Visit: Payer: Self-pay | Admitting: Internal Medicine

## 2023-04-02 DIAGNOSIS — E890 Postprocedural hypothyroidism: Secondary | ICD-10-CM

## 2023-04-02 MED ORDER — LEVOTHYROXINE SODIUM 100 MCG PO TABS
100.0000 ug | ORAL_TABLET | Freq: Every day | ORAL | 0 refills | Status: DC
Start: 2023-04-02 — End: 2023-05-01

## 2023-04-02 MED ORDER — EZETIMIBE 10 MG PO TABS: 10.0000 mg | ORAL_TABLET | Freq: Every day | ORAL | 3 refills | Status: DC

## 2023-04-20 ENCOUNTER — Other Ambulatory Visit: Payer: Self-pay | Admitting: Family

## 2023-04-27 ENCOUNTER — Ambulatory Visit (INDEPENDENT_AMBULATORY_CARE_PROVIDER_SITE_OTHER): Payer: Medicare Other | Admitting: Family

## 2023-04-27 ENCOUNTER — Encounter: Payer: Self-pay | Admitting: Family

## 2023-04-27 VITALS — BP 136/72 | HR 84 | Temp 98.4°F | Resp 18 | Ht 65.0 in | Wt 152.0 lb

## 2023-04-27 DIAGNOSIS — Z0000 Encounter for general adult medical examination without abnormal findings: Secondary | ICD-10-CM | POA: Diagnosis not present

## 2023-04-27 DIAGNOSIS — R899 Unspecified abnormal finding in specimens from other organs, systems and tissues: Secondary | ICD-10-CM | POA: Diagnosis not present

## 2023-04-27 DIAGNOSIS — E890 Postprocedural hypothyroidism: Secondary | ICD-10-CM

## 2023-04-27 DIAGNOSIS — R252 Cramp and spasm: Secondary | ICD-10-CM

## 2023-04-27 DIAGNOSIS — N3281 Overactive bladder: Secondary | ICD-10-CM

## 2023-04-27 DIAGNOSIS — M542 Cervicalgia: Secondary | ICD-10-CM | POA: Diagnosis not present

## 2023-04-27 DIAGNOSIS — E782 Mixed hyperlipidemia: Secondary | ICD-10-CM

## 2023-04-27 MED ORDER — TRAMADOL HCL 50 MG PO TABS: 50.0000 mg | ORAL_TABLET | Freq: Four times a day (QID) | ORAL | 0 refills | Status: AC | PRN

## 2023-04-27 NOTE — Progress Notes (Signed)
[name] is a 77 y.o. female with the following history as recorded in EpicCare:  Patient Active Problem List   Diagnosis Date Noted   Postoperative hypothyroidism 12/12/2021   Pituitary macroadenoma (HCC) 12/12/2021   Fatigue 12/12/2021   Eustachian tube dysfunction 01/12/2021   Left Achilles tendinitis 08/25/2020   Chronic hip pain after total replacement of left hip joint 06/15/2020   Primary localized osteoarthritis of hip 06/24/2019   Primary osteoarthritis of left hip 06/02/2019   S/P lumbar fusion 04/23/2019   Lumbar spinal stenosis 07/16/2018   Renal cyst 07/16/2018   Left lumbar radiculopathy 06/24/2018   Left hip pain 10/25/2017   Greater trochanteric bursitis of left hip 10/03/2017   Anxiety and depression 05/23/2017   Hyperlipidemia 04/21/2016   Routine general medical examination at a health care facility 01/05/2016   Medicare annual wellness visit, subsequent 01/05/2016   Allergic rhinitis 12/29/2015   Vaginal atrophy 10/22/2013   DJD (degenerative joint disease) of knee 10/18/2012   NEPHRECTOMY, HX OF 05/28/2009   Hypothyroidism, postsurgical 07/18/2007   HYPERLIPIDEMIA 07/18/2007   Essential hypertension 07/18/2007   OSTEOPENIA 07/15/2007   History of colonic polyps 07/15/2007    Current Outpatient Medications  Medication Sig Dispense Refill   Acetaminophen (TYLENOL ARTHRITIS PAIN PO) Take by mouth.     ALPRAZolam (XANAX) 0.25 MG tablet TAKE 2 TABLETS BY MOUTH AT BEDTIME AS NEEDED FOR ANXIETY 60 tablet 1   amLODipine (NORVASC) 10 MG tablet Take 1 tablet (10 mg total) by mouth daily. 90 tablet 0   benazepril (LOTENSIN) 40 MG tablet TAKE 1 TABLET BY MOUTH EVERY DAY 90 tablet 0   Cholecalciferol (D3 ADULT PO) Take by mouth.     ezetimibe (ZETIA) 10 MG tablet Take 1 tablet (10 mg total) by mouth daily. 90 tablet 3   levothyroxine (SYNTHROID) 100 MCG tablet Take 1 tablet (100 mcg total) by mouth daily before breakfast. 90 tablet 0   metoprolol succinate  (TOPROL-XL) 50 MG 24 hr tablet TAKE 1 TABLET BY MOUTH DAILY. TAKE WITH OR IMMEDIATELY FOLLOWING A MEAL. 90 tablet 3   Multiple Vitamins-Minerals (CENTRUM PO) Take 1 tablet by mouth daily.     NON FORMULARY Take 2,500 Units by mouth. daily     potassium citrate (UROCIT-K) 10 MEQ (1080 MG) SR tablet Take 10 mEq by mouth daily.     traMADol (ULTRAM) 50 MG tablet Take 1 tablet (50 mg total) by mouth every 6 (six) hours as needed for up to 7 days. 30 tablet 0   No current facility-administered medications for this visit.    Allergies: Atorvastatin, Rosuvastatin, Simvastatin, Codeine sulfate, and Erythromycin  Past Medical History:  Diagnosis Date   Adenomatous colon polyp    Allergy Years ago   Anxiety Years ago   Arthritis    Cataract    Chronic kidney disease    Left kidney removed   Complication of anesthesia    Depression Years ago   Heart murmur    MVP   Hyperlipidemia    Hypertension    Hypothyroidism    PONV (postoperative nausea and vomiting)    Sleep apnea Last few years   Get up 2 times a night to go to bathroom   Thyroid disease     Past Surgical History:  Procedure Laterality Date   ABDOMINAL HYSTERECTOMY     APPENDECTOMY     BACK SURGERY     BREAST EXCISIONAL BIOPSY Right 1983   COLONOSCOPY W/ POLYPECTOMY  2003,2006,2013  [name]   DIAGNOSTIC LAPAROSCOPY  1979   exploratory to get pregnant   DILATION AND CURETTAGE OF UTERUS     X2   EYE SURGERY     JOINT REPLACEMENT     NEPHRECTOMY  2010   Left for 9.8cm benign complex cyst   SPINE SURGERY     THYROIDECTOMY  2000   Dr Orpah Greek   TONSILLECTOMY     TOTAL ABDOMINAL HYSTERECTOMY W/ BILATERAL SALPINGOOPHORECTOMY  2003   [name] for dysfunctional menses   TOTAL HIP ARTHROPLASTY Left 06/24/2019   Procedure: TOTAL HIP ARTHROPLASTY ANTERIOR APPROACH;  Surgeon: Sheral Apley, MD;  Location: [facility];  Service: Orthopedics;  Laterality: Left;   TUBAL LIGATION      Family History  Problem Relation Age of Onset    Hyperlipidemia Mother    Transient ischemic attack Mother 50   Thyroid disease Mother        hyperthyroid   ADD / ADHD Mother    Heart disease Mother    Transient ischemic attack Father        in 74s   Coronary artery disease Father        stents @ 8   Nephrolithiasis Father    Urolithiasis Sister    Diabetes Neg Hx    Colon cancer Neg Hx    Rectal cancer Neg Hx    Stomach cancer Neg Hx    Esophageal cancer Neg Hx     Social History   Tobacco Use   Smoking status: Former    Current packs/day: 0.00    Average packs/day: 0.5 packs/day for 4.0 years (2.0 ttl pk-yrs)    Types: Cigarettes    Start date: 08/29/1971    Quit date: 08/29/1975    Years since quitting: 47.6   Smokeless tobacco: Never   Tobacco comments:    smoked 1973-1977, up to 1/2 ppd  Substance Use Topics   Alcohol use: Not Currently    Subjective:   Presents for yearly CPE; would like to get labs updated;  Continuing to struggle with overactive bladder- could not tolerate Mybetriq;  Chronic neck pain- did not feel that Celebrex was beneficial; would like to discuss treatment options;   Review of Systems  Constitutional: Negative.   HENT: Negative.    Eyes: Negative.   Respiratory: Negative.    Cardiovascular: Negative.   Gastrointestinal: Negative.   Genitourinary: Negative.   Musculoskeletal:  Positive for neck pain.  Skin: Negative.   Neurological: Negative.   Endo/Heme/Allergies: Negative.   Psychiatric/Behavioral: Negative.       Objective:  Vitals:   04/27/23 1300  BP: 136/72  Pulse: 84  Resp: 18  Temp: 98.4 F (36.9 C)  TempSrc: Oral  SpO2: 98%  Weight: 152 lb (68.9 kg)  Height: 5\' 5"  (1.651 m)    General: Well developed, well nourished, in no acute distress  Skin : Warm and dry.  Head: Normocephalic and atraumatic  Eyes: Sclera and conjunctiva clear; pupils round and reactive to light; extraocular movements intact  Ears: External normal; canals clear; tympanic membranes normal   Oropharynx: Pink, supple. No suspicious lesions  Neck: Supple without thyromegaly, adenopathy  Lungs: Respirations unlabored; clear to auscultation bilaterally without wheeze, rales, rhonchi  CVS exam: normal rate and regular rhythm.  Abdomen: Soft; nontender; nondistended; normoactive bowel sounds; no masses or hepatosplenomegaly  Musculoskeletal: No deformities; no active joint inflammation  Extremities: No edema, cyanosis, clubbing  Vessels: Symmetric bilaterally  Neurologic: Alert and oriented; speech intact; face symmetrical;  moves all extremities well; CNII-XII intact without focal deficit   Assessment:  1. PE (physical exam), annual   2. Overactive bladder   3. Bilateral leg cramps   4. Abnormal laboratory test result   5. HYPERLIPIDEMIA   6. Hypothyroidism, postsurgical   7. Neck pain     Plan:  Age appropriate preventive healthcare needs addressed; encouraged regular eye doctor and dental exams; encouraged regular exercise;  Trial of pelvic floor PT as patient has not done well on medications; Short term refill on Tramadol to help with chronic neck pain- she will follow up with her orthopedist to discuss further;  Follow up in 1 year, sooner prn based on labs.    No follow-ups on file.  Orders Placed This Encounter  Procedures   Alkaline phosphatase, isoenzymes   CBC with Differential/Platelet   Comp Met (CMET)   Lipid panel   TSH   B12   Magnesium   Ambulatory referral to Physical Therapy    Referral Priority:   Routine    Referral Type:   Physical Medicine    Referral Reason:   Specialty Services Required    Requested Specialty:   Physical Therapy    Number of Visits Requested:   1    Requested Prescriptions   Signed Prescriptions Disp Refills   traMADol (ULTRAM) 50 MG tablet 30 tablet 0    Sig: Take 1 tablet (50 mg total) by mouth every 6 (six) hours as needed for up to 7 days.

## 2023-04-28 ENCOUNTER — Encounter: Payer: Self-pay | Admitting: Family

## 2023-04-28 LAB — COMPREHENSIVE METABOLIC PANEL
AG Ratio: 1.5 (calc) (ref 1.0–2.5)
ALT: 12 U/L (ref 6–29)
AST: 18 U/L (ref 10–35)
Albumin: 4.5 g/dL (ref 3.6–5.1)
Alkaline phosphatase (APISO): 146 U/L (ref 37–153)
BUN: 18 mg/dL (ref 7–25)
CO2: 25 mmol/L (ref 20–32)
Calcium: 9.7 mg/dL (ref 8.6–10.4)
Chloride: 103 mmol/L (ref 98–110)
Creat: 0.99 mg/dL (ref 0.60–1.00)
Globulin: 3.1 g/dL (ref 1.9–3.7)
Glucose, Bld: 90 mg/dL (ref 65–99)
Potassium: 4.8 mmol/L (ref 3.5–5.3)
Sodium: 141 mmol/L (ref 135–146)
Total Bilirubin: 0.5 mg/dL (ref 0.2–1.2)
Total Protein: 7.6 g/dL (ref 6.1–8.1)

## 2023-04-28 LAB — LIPID PANEL
Cholesterol: 317 mg/dL — ABNORMAL HIGH (ref <200)
HDL: 41 mg/dL — ABNORMAL LOW (ref >=50)
Non-HDL Cholesterol (Calc): 276 mg/dL — ABNORMAL HIGH (ref <130)
Total CHOL/HDL Ratio: 7.7 (calc) — ABNORMAL HIGH (ref <5.0)
Triglycerides: 717 mg/dL — ABNORMAL HIGH (ref <150)

## 2023-04-28 LAB — TSH: TSH: 0.2 m[IU]/L — ABNORMAL LOW (ref 0.40–4.50)

## 2023-04-28 LAB — CBC WITH DIFFERENTIAL/PLATELET
Absolute Monocytes: 992 {cells}/uL — ABNORMAL HIGH (ref 200–950)
Basophils Absolute: 36 {cells}/uL (ref 0–200)
Basophils Relative: 0.4 %
Eosinophils Absolute: 428 {cells}/uL (ref 15–500)
Eosinophils Relative: 4.7 %
HCT: 45.9 % — ABNORMAL HIGH (ref 35.0–45.0)
Hemoglobin: 15.3 g/dL (ref 11.7–15.5)
Lymphs Abs: 1602 {cells}/uL (ref 850–3900)
MCH: 30.8 pg (ref 27.0–33.0)
MCHC: 33.3 g/dL (ref 32.0–36.0)
MCV: 92.4 fL (ref 80.0–100.0)
MPV: 10.4 fL (ref 7.5–12.5)
Monocytes Relative: 10.9 %
Neutro Abs: 6042 {cells}/uL (ref 1500–7800)
Neutrophils Relative %: 66.4 %
Platelets: 329 10*3/uL (ref 140–400)
RBC: 4.97 10*6/uL (ref 3.80–5.10)
RDW: 12.5 % (ref 11.0–15.0)
Total Lymphocyte: 17.6 %
WBC: 9.1 10*3/uL (ref 3.8–10.8)

## 2023-04-28 LAB — MAGNESIUM: Magnesium: 2.3 mg/dL (ref 1.5–2.5)

## 2023-04-28 LAB — VITAMIN B12: Vitamin B-12: 605 pg/mL (ref 200–1100)

## 2023-05-01 ENCOUNTER — Other Ambulatory Visit: Payer: Self-pay | Admitting: Family

## 2023-05-01 DIAGNOSIS — E890 Postprocedural hypothyroidism: Secondary | ICD-10-CM

## 2023-05-01 MED ORDER — LEVOTHYROXINE SODIUM 88 MCG PO TABS: 88.0000 ug | ORAL_TABLET | Freq: Every day | ORAL | 0 refills | Status: DC

## 2023-05-04 ENCOUNTER — Ambulatory Visit: Payer: Medicare Other | Attending: Nurse Practitioner | Admitting: Nurse Practitioner

## 2023-05-04 ENCOUNTER — Encounter: Payer: Self-pay | Admitting: Nurse Practitioner

## 2023-05-04 ENCOUNTER — Other Ambulatory Visit (HOSPITAL_COMMUNITY): Payer: Self-pay

## 2023-05-04 ENCOUNTER — Telehealth: Payer: Self-pay | Admitting: Pharmacist

## 2023-05-04 VITALS — BP 136/80 | HR 75 | Ht 65.0 in | Wt 152.0 lb

## 2023-05-04 DIAGNOSIS — R931 Abnormal findings on diagnostic imaging of heart and coronary circulation: Secondary | ICD-10-CM

## 2023-05-04 DIAGNOSIS — I700 Atherosclerosis of aorta: Secondary | ICD-10-CM | POA: Diagnosis not present

## 2023-05-04 DIAGNOSIS — I10 Essential (primary) hypertension: Secondary | ICD-10-CM

## 2023-05-04 DIAGNOSIS — I341 Nonrheumatic mitral (valve) prolapse: Secondary | ICD-10-CM | POA: Diagnosis not present

## 2023-05-04 DIAGNOSIS — E890 Postprocedural hypothyroidism: Secondary | ICD-10-CM

## 2023-05-04 DIAGNOSIS — E785 Hyperlipidemia, unspecified: Secondary | ICD-10-CM

## 2023-05-04 MED ORDER — REPATHA SURECLICK 140 MG/ML ~~LOC~~ SOAJ: 140.0000 mg | SUBCUTANEOUS | 2 refills | Status: DC

## 2023-05-04 NOTE — Progress Notes (Signed)
Office Visit    Patient Name: [name] Date of Encounter: 05/04/2023  Primary Care Provider:  Olive Bass, FNP Primary Cardiologist:  Chrystie Nose, MD  Chief Complaint    77 year old female with a history of coronary artery calcification, mitral valve prolapse, hypertension, hyperlipidemia, and hypothyroidism who presents for follow-up related to elevated coronary artery calcium score.   Past Medical History    Past Medical History:  Diagnosis Date   Adenomatous colon polyp    Allergy Years ago   Anxiety Years ago   Arthritis    Cataract    Chronic kidney disease    Left kidney removed   Complication of anesthesia    Depression Years ago   Heart murmur    MVP   Hyperlipidemia    Hypertension    Hypothyroidism    PONV (postoperative nausea and vomiting)    Sleep apnea Last few years   Get up 2 times a night to go to bathroom   Thyroid disease    Past Surgical History:  Procedure Laterality Date   ABDOMINAL HYSTERECTOMY     APPENDECTOMY     BACK SURGERY     BREAST EXCISIONAL BIOPSY Right 1983   COLONOSCOPY W/ POLYPECTOMY  2003,2006,2013   [name]   DIAGNOSTIC LAPAROSCOPY  1979   exploratory to get pregnant   DILATION AND CURETTAGE OF UTERUS     X2   EYE SURGERY     JOINT REPLACEMENT     NEPHRECTOMY  2010   Left for 9.8cm benign complex cyst   SPINE SURGERY     THYROIDECTOMY  2000   Dr Orpah Greek   TONSILLECTOMY     TOTAL ABDOMINAL HYSTERECTOMY W/ BILATERAL SALPINGOOPHORECTOMY  2003   [name] for dysfunctional menses   TOTAL HIP ARTHROPLASTY Left 06/24/2019   Procedure: TOTAL HIP ARTHROPLASTY ANTERIOR APPROACH;  Surgeon: Sheral Apley, MD;  Location: [facility];  Service: Orthopedics;  Laterality: Left;   TUBAL LIGATION      Allergies  Allergies  Allergen Reactions   Atorvastatin     myalgias    Rosuvastatin Other (See Comments)    Leg & muscle weakness   Simvastatin     myalgias   Codeine Sulfate     "made me jumpy"    Erythromycin Nausea Only     Labs/Other Studies Reviewed    The following studies were reviewed today:  Cardiac Studies & Procedures     STRESS TESTS  MYOCARDIAL PERFUSION IMAGING 03/23/2021  Narrative  The left ventricular ejection fraction is normal (55-65%).  Nuclear stress EF: 61%.  There was no ST segment deviation noted during stress.  No T wave inversion was noted during stress.  There is normal myocardial perfusion with no evidence of ischemia or infarction.  This is a low risk study.  Laurance Flatten, MD      CT SCANS  CT CARDIAC SCORING (SELF PAY ONLY) 11/20/2019  Addendum 11/20/2019  5:39 PM ADDENDUM REPORT: 11/20/2019 17:37  CLINICAL DATA:  Risk stratification  EXAM: Coronary Calcium Score  TECHNIQUE: The patient was scanned on a [employer] scanner. Axial non-contrast 3 mm slices were carried out through the heart. The data set was analyzed on a dedicated work station and scored using the Agatson method.  FINDINGS: Non-cardiac: See separate report from [facility].  Ascending Aorta: Normal caliber. Aortic atherosclerosis. Aortic root calcification overlying the RCA ostium.  Pericardium: Normal  Coronary arteries: Normal origin.  LAD and RCA calcification.  IMPRESSION:  Coronary calcium score of 61. This was 57th percentile for age and sex matched control.   Electronically Signed By: Chrystie Nose M.D. On: 11/20/2019 17:37  Narrative EXAM: OVER-READ INTERPRETATION  CT CHEST  The following report is an over-read performed by radiologist Dr. Trudie Reed of [facility] on 11/20/2019. This over-read does not include interpretation of cardiac or coronary anatomy or pathology. The coronary calcium score interpretation by the cardiologist is attached.  COMPARISON:  None.  FINDINGS: Aortic atherosclerosis. Within the visualized portions of the thorax there are no suspicious appearing pulmonary nodules or  masses, there is no acute consolidative airspace disease, no pleural effusions, no pneumothorax and no lymphadenopathy. Visualized portions of the upper abdomen are unremarkable. Small hiatal hernia. There are no aggressive appearing lytic or blastic lesions noted in the visualized portions of the skeleton.  IMPRESSION: 1.  Aortic Atherosclerosis (ICD10-I70.0).  Electronically Signed: By: Trudie Reed M.D. On: 11/20/2019 13:40         Recent Labs: 04/27/2023: ALT 12; BUN 18; Creat 0.99; Hemoglobin 15.3; Magnesium 2.3; Platelets 329; Potassium 4.8; Sodium 141; TSH 0.20  Recent Lipid Panel    Component Value Date/Time   CHOL 317 (H) 04/27/2023 1356   TRIG 717 (H) 04/27/2023 1356   TRIG 152 (H) 07/13/2006 1126   HDL 41 (L) 04/27/2023 1356   CHOLHDL 7.7 (H) 04/27/2023 1356   VLDL 78.6 (H) 01/26/2021 1149   LDLCALC  04/27/2023 1356     Comment:     . LDL cholesterol not calculated. Triglyceride levels greater than 400 mg/dL invalidate calculated LDL results. . Reference range: <100 . Desirable range <100 mg/dL for primary prevention;   <70 mg/dL for patients with CHD or diabetic patients  with > or = 2 CHD risk factors. [name] LDL-C is now calculated using the Martin-Hopkins  calculation, which is a validated novel method providing  better accuracy than the Friedewald equation in the  estimation of LDL-C.  Horald Pollen et al. Lenox Ahr. 4132;440(10): 2061-2068  (http://education.[facility].com/faq/FAQ164)    LDLDIRECT 89.0 01/26/2021 1149    History of Present Illness    77 year old female with the above past medical history including coronary artery calcification, mitral valve prolapse, hypertension, hyperlipidemia, and hypothyroidism.  She was initially referred to Dr. Rennis Golden in the setting of dyslipidemia.  Coronary calcium score in 2021 was 61 (57 percentile), there was also evidence of aortic atherosclerosis.  She was last seen in the office on 03/09/2021 and was stable  from a cardiac standpoint.  She denied symptoms concerning for angina.  Lexiscan Myoview in the setting of preoperative cardiac evaluation was low risk.  She was approved for Repatha.  She was seen virtually on 02/09/2022 for preoperative cardiac evaluation and was doing well.  She denied symptoms concerning for angina.  She presents today for follow-up. Since her last she has done well from a cardiac standpoint. She denies any symptoms concerning for angina. Denies dyspnea, edema, PND, orthopnea, weight gain.  She notes she never started taking Repatha as she was concerned that it would put her in the donut hole.  She is willing to start taking Repatha if possible. Overall, she reports feeling well.  Home Medications    Current Outpatient Medications  Medication Sig Dispense Refill   Acetaminophen (TYLENOL ARTHRITIS PAIN PO) Take by mouth.     ALPRAZolam (XANAX) 0.25 MG tablet TAKE 2 TABLETS BY MOUTH AT BEDTIME AS NEEDED FOR ANXIETY 60 tablet 1   amLODipine (NORVASC) 10 MG tablet Take  1 tablet (10 mg total) by mouth daily. 90 tablet 0   benazepril (LOTENSIN) 40 MG tablet TAKE 1 TABLET BY MOUTH EVERY DAY 90 tablet 0   Evolocumab (REPATHA SURECLICK) 140 MG/ML SOAJ Inject 140 mg into the skin every 14 (fourteen) days. 6 mL 2   ezetimibe (ZETIA) 10 MG tablet Take 1 tablet (10 mg total) by mouth daily. 90 tablet 3   levothyroxine (SYNTHROID) 88 MCG tablet Take 1 tablet (88 mcg total) by mouth daily before breakfast. 90 tablet 0   metoprolol succinate (TOPROL-XL) 50 MG 24 hr tablet TAKE 1 TABLET BY MOUTH DAILY. TAKE WITH OR IMMEDIATELY FOLLOWING A MEAL. (Patient taking differently: Take 50 mg by mouth daily. TAKE WITH OR IMMEDIATELY FOLLOWING A MEAL.Takes 0.5 daily) 90 tablet 3   Multiple Vitamins-Minerals (CENTRUM PO) Take 1 tablet by mouth daily.     NON FORMULARY Take 2,500 Units by mouth. daily     potassium citrate (UROCIT-K) 10 MEQ (1080 MG) SR tablet Take 10 mEq by mouth daily.     traMADol  (ULTRAM) 50 MG tablet Take 1 tablet (50 mg total) by mouth every 6 (six) hours as needed for up to 7 days. (Patient taking differently: Take 50 mg by mouth daily. Takes 0.5 tablet daily) 30 tablet 0   Cholecalciferol (D3 ADULT PO) Take by mouth. (Patient not taking: Reported on 05/04/2023)     No current facility-administered medications for this visit.     Review of Systems    She denies chest pain, palpitations, dyspnea, pnd, orthopnea, n, v, dizziness, syncope, edema, weight gain, or early satiety. All other systems reviewed and are otherwise negative except as noted above.   Physical Exam    VS:  BP 136/80 (BP Location: Left Arm, Patient Position: Sitting, Cuff Size: Normal)   Pulse 75   Ht 5\' 5"  (1.651 m)   Wt 152 lb (68.9 kg)   SpO2 98%   BMI 25.29 kg/m   GEN: Well nourished, well developed, in no acute distress. HEENT: normal. Neck: Supple, no JVD, carotid bruits, or masses. Cardiac: RRR, no murmurs, rubs, or gallops. No clubbing, cyanosis, edema.  Radials/DP/PT 2+ and equal bilaterally.  Respiratory:  Respirations regular and unlabored, clear to auscultation bilaterally. GI: Soft, nontender, nondistended, BS + x 4. MS: no deformity or atrophy. Skin: warm and dry, no rash. Neuro:  Strength and sensation are intact. Psych: Normal affect.  Accessory Clinical Findings    ECG personally reviewed by me today - EKG Interpretation Date/Time:  Friday May 04 2023 10:58:43 EDT Ventricular Rate:  75 PR Interval:  152 QRS Duration:  76 QT Interval:  400 QTC Calculation: 446 R Axis:   7  Text Interpretation: Normal sinus rhythm Normal ECG When compared with ECG of 21-Apr-2019 14:28, No significant change was found Confirmed by Bernadene Person ([id number]) on 05/04/2023 11:03:56 AM    Lab Results  Component Value Date   WBC 9.1 04/27/2023   HGB 15.3 04/27/2023   HCT 45.9 (H) 04/27/2023   MCV 92.4 04/27/2023   PLT 329 04/27/2023   Lab Results  Component Value Date   CREATININE  0.99 04/27/2023   BUN 18 04/27/2023   NA 141 04/27/2023   K 4.8 04/27/2023   CL 103 04/27/2023   CO2 25 04/27/2023   Lab Results  Component Value Date   ALT 12 04/27/2023   AST 18 04/27/2023   ALKPHOS 196 (H) 04/27/2023   BILITOT 0.5 04/27/2023   Lab Results  Component Value  Date   CHOL 317 (H) 04/27/2023   HDL 41 (L) 04/27/2023   LDLCALC  04/27/2023     Comment:     . LDL cholesterol not calculated. Triglyceride levels greater than 400 mg/dL invalidate calculated LDL results. . Reference range: <100 . Desirable range <100 mg/dL for primary prevention;   <70 mg/dL for patients with CHD or diabetic patients  with > or = 2 CHD risk factors. [name] LDL-C is now calculated using the Martin-Hopkins  calculation, which is a validated novel method providing  better accuracy than the Friedewald equation in the  estimation of LDL-C.  Horald Pollen et al. Lenox Ahr. 5284;132(44): 2061-2068  (http://education.[facility].com/faq/FAQ164)    LDLDIRECT 89.0 01/26/2021   TRIG 717 (H) 04/27/2023   CHOLHDL 7.7 (H) 04/27/2023    Lab Results  Component Value Date   HGBA1C 5.8 02/21/2008    Assessment & Plan   1. Elevated coronary artery calcium score/aortic atherosclerosis: Coronary calcium score in 2021 was 61 (57 percentile), there was also evidence of aortic atherosclerosis. Stable with no anginal symptoms. No indication for ischemic evaluation. Continue metoprolol, amlodipine, and Zetia, start Repatha as below.  2. Mitral valve prolapse: Noted on past medical history.  No recent echo. Consider repeat echo as clinically indicated.   3. Hypertension: BP well controlled. Continue current antihypertensive regimen.   4. Hyperlipidemia: LDL was UTC on most recent lipid profile due to triglycerides of 717. She never started taking Repatha as she was concerned about reaching the donut hole/cost.  She is agreeable to start Repatha at this time.  Will send new prescription. Dr. Blanchie Dessert nurse  discussed [facility] Assistance with pt in clinic today. Will send message to PA team to renew PA. Will plan for fasting lipids in 3 months. Will schedule f/u with Dr. Rennis Golden.   5. Hypothyroidism: TSH was 0.2 in 03/2023. On synthroid.   6. Disposition: Follow-up in 4 months with Dr. Rennis Golden for lipids.      Joylene Grapes, NP 05/04/2023, 11:49 AM

## 2023-05-04 NOTE — Telephone Encounter (Signed)
-----   Message from Cheree Ditto sent at 05/04/2023  1:09 PM EDT ----- Regarding: FW: Repatha  ----- Message ----- From: Cheree Ditto, [facility] Sent: 05/04/2023  12:52 PM EDT To: Rx Prior Auth Team Subject: FW: Repatha                                     ----- Message ----- From: Clotilde Dieter, CMA Sent: 05/04/2023  11:42 AM EDT To: Cheree Ditto, RPH Subject: [name] would like to restart Patient on Repatha could you please begin to work on the Prior Auth.

## 2023-05-04 NOTE — Patient Instructions (Addendum)
Medication Instructions:  Repatha 140 MG/mL inject into skin once every 2 weeks *If you need a refill on your cardiac medications before your next appointment, please call your pharmacy*   Lab Work: Lipids and LFT's in 3 months If you have labs (blood work) drawn today and your tests are completely normal, you will receive your results only by: MyChart Message (if you have MyChart) OR A paper copy in the mail If you have any lab test that is abnormal or we need to change your treatment, we will call you to review the results.   Testing/Procedures: No testing  Follow-Up: At [facility], you and your health needs are our priority.  As part of our continuing mission to provide you with exceptional heart care, we have created designated Provider Care Teams.  These Care Teams include your primary Cardiologist (physician) and Advanced Practice Providers (APPs -  Physician Assistants and Nurse Practitioners) who all work together to provide you with the care you need, when you need it.  We recommend signing up for the patient portal called "MyChart".  Sign up information is provided on this After Visit Summary.  MyChart is used to connect with patients for Virtual Visits (Telemedicine).  Patients are able to view lab/test results, encounter notes, upcoming appointments, etc.  Non-urgent messages can be sent to your provider as well.   To learn more about what you can do with MyChart, go to [email or web address].    Your next appointment:   4 month(s)  Provider:   Chrystie Nose, MD  Other Instructions Repatha 140mg /mL (PCSK9). This is an injectable cholesterol medication self-administered once every 14 days. This medication will likely need prior approval with your insurance company, which we will work on. If the medication is not approved initially, we may need to do an appeal with your insurance.   Administer medication in area of fatty tissue such as abdomen, outer thigh, back  of upper arm - and rotate site with each injection Store medication in refrigerator until ready to administer - allow to sit at room temp for 30 mins - 1 hour prior to injection Dispose of medication in a SHARPS container - your pharmacy should be able to direct you on this and proper disposal   If you need a co-pay card for Repatha: Lawsponsor.fr If you need a co-pay card for Praluent: [email or web address]  Patient Assistance:    These foundations have funds at various times.   The [employer]: https://www.panfoundation.org/disease-funds/hypercholesterolemia/ -- can sign up for wait list  [facility] offers assistance to help pay for medication copays.  They will cover copays for all cholesterol lowering meds, including statins, fibrates, omega-3 fish oils like Vascepa, ezetimibe, Repatha, Praluent, Nexletol, Nexlizet.  The cards are usually good for $2,500 or 12 months, whichever comes first. Our fax # is [phone] (you will need this to apply) Go to healthwellfoundation.org Click on "Apply Now" Answer questions as to whom is applying (patient or representative) Your disease fund will be "hypercholesterolemia - Medicare access" They will ask questions about finances and which medications you are taking for cholesterol When you submit, the approval is usually within minutes.  You will need to print the card information from the site You will need to show this information to your pharmacy, they will bill your Medicare Part D plan first -then bill [employer] --for the copay.   You can also call them at [phone], although the hold times can be quite long.

## 2023-05-04 NOTE — Telephone Encounter (Signed)
Patient does not need a PA. Medication was filled today at her pharmacy

## 2023-05-09 DIAGNOSIS — M1711 Unilateral primary osteoarthritis, right knee: Secondary | ICD-10-CM | POA: Diagnosis not present

## 2023-05-10 LAB — ALKALINE PHOSPHATASE, ISOENZYMES
Alkaline Phosphatase: 196 IU/L — ABNORMAL HIGH (ref 44–121)
BONE FRACTION: 69 % — ABNORMAL HIGH (ref 14–68)
INTESTINAL FRAC.: 0 % (ref 0–18)
LIVER FRACTION: 31 % (ref 18–85)

## 2023-05-11 ENCOUNTER — Telehealth: Payer: Self-pay | Admitting: Family

## 2023-05-11 NOTE — Telephone Encounter (Signed)
Her alk phos panel is essentially the same with concern for bone source. I want to check one more set of labs but I think we need Dr. Lonzo Cloud to see her in follow up for second opinion. Does she want lab appointment here or go to [facility]?

## 2023-05-11 NOTE — Telephone Encounter (Signed)
Called pt and left a VM informing pt PCP would like to do some more lab work, asked pt to give the office a call back to get a lab appointment scheduled.

## 2023-05-14 NOTE — Telephone Encounter (Signed)
Patient called and would like to know if she needs to do the labs still if she is still seeing Dr. Rennis Golden for cholesterol.  Please call patient

## 2023-05-14 NOTE — Telephone Encounter (Signed)
Patient is asking about her medication repatha as well as the medication zetia if she should take it or not

## 2023-05-15 ENCOUNTER — Other Ambulatory Visit: Payer: Self-pay | Admitting: Family

## 2023-05-15 ENCOUNTER — Encounter: Payer: Self-pay | Admitting: Internal Medicine

## 2023-05-15 DIAGNOSIS — R899 Unspecified abnormal finding in specimens from other organs, systems and tissues: Secondary | ICD-10-CM

## 2023-05-15 NOTE — Telephone Encounter (Signed)
Spoke to patient she stated Repatha cost too much.Stated Pharmacist advised Praluent might be cheaper.Advised I will send message to Dr.Hilty.

## 2023-05-15 NOTE — Telephone Encounter (Signed)
Yes, recommend patient continue to take Zetia along with Repatha until her lipid panel in rechecked in 3 months

## 2023-05-15 NOTE — Telephone Encounter (Signed)
Spoke with pt, pt is aware and expressed understanding, pt states she would like to go to [facility] lab. Pt plans on going "next week".

## 2023-05-15 NOTE — Telephone Encounter (Signed)
Called pt and informed her of pharmacy recommendations to take both zetia and repatha until next lipid panel draw in . Reviewed subcutaneous injection locations with patient as well. Patient acknowledged understanding of information provided today. No further questions or concerns voiced at this time.

## 2023-05-17 MED ORDER — FENOFIBRATE 160 MG PO TABS: 160.0000 mg | ORAL_TABLET | Freq: Every day | ORAL | 3 refills | Status: DC

## 2023-05-21 ENCOUNTER — Other Ambulatory Visit: Payer: Self-pay | Admitting: Internal Medicine

## 2023-05-21 ENCOUNTER — Other Ambulatory Visit (INDEPENDENT_AMBULATORY_CARE_PROVIDER_SITE_OTHER): Payer: Medicare Other

## 2023-05-21 DIAGNOSIS — E890 Postprocedural hypothyroidism: Secondary | ICD-10-CM | POA: Diagnosis not present

## 2023-05-21 DIAGNOSIS — R899 Unspecified abnormal finding in specimens from other organs, systems and tissues: Secondary | ICD-10-CM | POA: Diagnosis not present

## 2023-05-21 LAB — TSH: TSH: 0.7 u[IU]/mL (ref 0.35–5.50)

## 2023-05-21 LAB — VITAMIN D 25 HYDROXY (VIT D DEFICIENCY, FRACTURES): VITD: 33.47 ng/mL (ref 30.00–100.00)

## 2023-05-21 LAB — GAMMA GT: GGT: 22 U/L (ref 7–51)

## 2023-05-21 MED ORDER — VASCEPA 1 G PO CAPS: 2.0000 g | ORAL_CAPSULE | Freq: Two times a day (BID) | ORAL | 11 refills | Status: DC

## 2023-05-21 NOTE — Progress Notes (Signed)
Generic vascepa appears less expensive than lovaza - will Rx for that

## 2023-05-22 ENCOUNTER — Other Ambulatory Visit: Payer: Self-pay | Admitting: Family

## 2023-05-22 DIAGNOSIS — F32A Depression, unspecified: Secondary | ICD-10-CM

## 2023-05-23 NOTE — Telephone Encounter (Signed)
Requesting: alprazolam 0.25mg   Contract: None UDS: 02/19/2013 Last Visit: 04/27/23 Next Visit: None Last Refill: 03/06/23 #60 and 1RF   Please Advise

## 2023-05-24 ENCOUNTER — Other Ambulatory Visit: Payer: Self-pay | Admitting: Family

## 2023-05-24 ENCOUNTER — Encounter: Payer: Self-pay | Admitting: Family

## 2023-05-24 DIAGNOSIS — R748 Abnormal levels of other serum enzymes: Secondary | ICD-10-CM

## 2023-05-24 LAB — PTH, INTACT AND CALCIUM
Calcium: 9.7 mg/dL (ref 8.6–10.4)
PTH: 45 pg/mL (ref 16–77)

## 2023-06-01 ENCOUNTER — Other Ambulatory Visit: Payer: Self-pay | Admitting: Family

## 2023-06-12 ENCOUNTER — Other Ambulatory Visit: Payer: Medicare Other

## 2023-06-20 ENCOUNTER — Other Ambulatory Visit: Payer: Self-pay | Admitting: Family

## 2023-06-28 ENCOUNTER — Other Ambulatory Visit: Payer: Self-pay | Admitting: Family

## 2023-06-28 DIAGNOSIS — E890 Postprocedural hypothyroidism: Secondary | ICD-10-CM

## 2023-07-14 ENCOUNTER — Other Ambulatory Visit: Payer: Self-pay | Admitting: Family

## 2023-07-19 ENCOUNTER — Encounter: Payer: Self-pay | Admitting: Internal Medicine

## 2023-07-19 ENCOUNTER — Ambulatory Visit: Payer: Medicare Other | Admitting: Internal Medicine

## 2023-07-19 VITALS — BP 124/80 | HR 66 | Ht 65.0 in | Wt 155.0 lb

## 2023-07-19 DIAGNOSIS — D352 Benign neoplasm of pituitary gland: Secondary | ICD-10-CM

## 2023-07-19 DIAGNOSIS — Z85850 Personal history of malignant neoplasm of thyroid: Secondary | ICD-10-CM | POA: Diagnosis not present

## 2023-07-19 DIAGNOSIS — M8580 Other specified disorders of bone density and structure, unspecified site: Secondary | ICD-10-CM | POA: Diagnosis not present

## 2023-07-19 DIAGNOSIS — R748 Abnormal levels of other serum enzymes: Secondary | ICD-10-CM | POA: Diagnosis not present

## 2023-07-19 DIAGNOSIS — E890 Postprocedural hypothyroidism: Secondary | ICD-10-CM | POA: Diagnosis not present

## 2023-07-19 LAB — COMPREHENSIVE METABOLIC PANEL
ALT: 18 U/L (ref 0–35)
AST: 21 U/L (ref 0–37)
Albumin: 4.8 g/dL (ref 3.5–5.2)
Alkaline Phosphatase: 143 U/L — ABNORMAL HIGH (ref 39–117)
BUN: 19 mg/dL (ref 6–23)
CO2: 27 meq/L (ref 19–32)
Calcium: 9.8 mg/dL (ref 8.4–10.5)
Chloride: 103 meq/L (ref 96–112)
Creatinine, Ser: 0.93 mg/dL (ref 0.40–1.20)
GFR: 59.29 mL/min — ABNORMAL LOW (ref >60.00)
Glucose, Bld: 120 mg/dL — ABNORMAL HIGH (ref 70–99)
Potassium: 3.9 meq/L (ref 3.5–5.1)
Sodium: 140 meq/L (ref 135–145)
Total Bilirubin: 0.6 mg/dL (ref 0.2–1.2)
Total Protein: 8.1 g/dL (ref 6.0–8.3)

## 2023-07-19 LAB — CORTISOL: Cortisol, Plasma: 10.2 ug/dL

## 2023-07-19 LAB — FOLLICLE STIMULATING HORMONE: FSH: 88.5 m[IU]/mL

## 2023-07-19 LAB — TSH: TSH: 6.03 u[IU]/mL — ABNORMAL HIGH (ref 0.35–5.50)

## 2023-07-19 NOTE — Patient Instructions (Addendum)
Continue Multivitamin  Start over the counter Calcium with Vitamin D 600 mg, 1 tablet  daily

## 2023-07-19 NOTE — Progress Notes (Signed)
Name: [name]  MRN/ DOB: [id number], 1946/02/26    Age/ Sex: 77 y.o., female    PCP: Olive Bass, FNP   Reason for Endocrinology Evaluation: Pituitary macroadenoma      Date of Initial Endocrinology Evaluation: 12/12/2021    HPI: Ms. [name] is a 77 y.o. female with a past medical history of HTN, postoperative Hypothyroidism , dyslipidemia , Hx of left nephrectomy ( 9.8 cm benign cyst) .  The patient presented for initial endocrinology clinic visit on 12/12/2021 for consultative assistance with her Pituitary macroadenoma .     Pt has been diagnosed with pituitary macroadenoma 1.3 cm on MRI 03/2021 during evaluation of tinnitus and hearing loss.    She is S/P total  thyroidectomy in 2000 . Per pt small areas of cancer were found but no further treatment was needed    She sees Dr. Pollyann Kennedy for right hearing loss and tinnitus  Has right kidney stone    On her initial visit to our clinic she had normal prolactin, IGF-I, ACTH, cortisol and TSH.  As well as appropriately elevated FSH    Elevated Alkaline Phosphatase: Patient has been noted with elevated alkaline phosphatase 05/2022 with a maximum serum level of 173 IU/mL in 09/2022  Alkaline phosphatase isoenzymes showed elevated bone fracture 72%, normal liver fraction 28% and 0% intestinal fraction  SUBJECTIVE:    Today (07/19/23):  [name] is here for follow-up on pituitary adenoma and elevated alkaline phosphatase   She has been following up with cardiology for coronary artery calcifications, mitral valve prolapse  She was evaluated by general surgery for gallstones  In reviewing her records, the patient did have a bone scan 05/2020 showing uptake adjacent to the femoral component of the left hip prosthesis in a pattern suspicious for aseptic loosening at the time  DXA scan showed osteopenia 05/2020 with the right femoral neck BMD -2.1  She is s/p left knee surgery 2023 Right knee replacement  2022 S/P back and Left hip sx 2021  She has leg weakness  Has noted fatigue  No back or pain  Weight has been stable overall Denies headaches  Denies constipation nor diarrhea   NO swelling or hands and feet but has tingling of feet  Denies galactorrhea  Denies local neck swelling  No recent prednisone  No Biotin  No recent fracture or falls   Last MRI summer 2024  Levothyroxine 88 mcg daily  She is on MVI daily  Vitamin D - not consistent  Not on calcium tablets     HISTORY:  Past Medical History:  Past Medical History:  Diagnosis Date   Adenomatous colon polyp    Allergy Years ago   Anxiety Years ago   Arthritis    Cataract    Chronic kidney disease    Left kidney removed   Complication of anesthesia    Depression Years ago   Heart murmur    MVP   Hyperlipidemia    Hypertension    Hypothyroidism    PONV (postoperative nausea and vomiting)    Sleep apnea Last few years   Get up 2 times a night to go to bathroom   Thyroid disease    Past Surgical History:  Past Surgical History:  Procedure Laterality Date   ABDOMINAL HYSTERECTOMY     APPENDECTOMY     BACK SURGERY     BREAST EXCISIONAL BIOPSY Right 1983   COLONOSCOPY W/ POLYPECTOMY  2003,2006,2013   [name]  DIAGNOSTIC LAPAROSCOPY  1979   exploratory to get pregnant   DILATION AND CURETTAGE OF UTERUS     X2   EYE SURGERY     JOINT REPLACEMENT     NEPHRECTOMY  2010   Left for 9.8cm benign complex cyst   SPINE SURGERY     THYROIDECTOMY  2000   Dr Orpah Greek   TONSILLECTOMY     TOTAL ABDOMINAL HYSTERECTOMY W/ BILATERAL SALPINGOOPHORECTOMY  2003   [name] for dysfunctional menses   TOTAL HIP ARTHROPLASTY Left 06/24/2019   Procedure: TOTAL HIP ARTHROPLASTY ANTERIOR APPROACH;  Surgeon: Sheral Apley, MD;  Location: [facility];  Service: Orthopedics;  Laterality: Left;   TUBAL LIGATION      Social History:  reports that she quit smoking about 47 years ago. Her smoking use included cigarettes. She  started smoking about 51 years ago. She has a 2 pack-year smoking history. She has never used smokeless tobacco. She reports that she does not currently use alcohol. She reports that she does not use drugs. Family History: family history includes ADD / ADHD in her mother; Coronary artery disease in her father; Heart disease in her mother; Hyperlipidemia in her mother; Nephrolithiasis in her father; Thyroid disease in her mother; Transient ischemic attack in her father; Transient ischemic attack (age of onset: 32) in her mother; Urolithiasis in her sister.   HOME MEDICATIONS: Allergies as of 07/19/2023       Reactions   Atorvastatin    myalgias    Simvastatin    myalgias   Codeine Sulfate    "made me jumpy"   Erythromycin Nausea Only        Medication List        Accurate as of July 19, 2023  9:48 AM. If you have any questions, ask your nurse or doctor.          STOP taking these medications    Repatha SureClick 140 MG/ML Soaj Generic drug: Evolocumab Stopped by: Johnney Ou Adelaine Roppolo       TAKE these medications    ALPRAZolam 0.25 MG tablet Commonly known as: XANAX TAKE 2 TABLETS BY MOUTH AT BEDTIME AS NEEDED FOR ANXIETY   amLODipine 10 MG tablet Commonly known as: NORVASC Take 1 tablet (10 mg total) by mouth daily.   benazepril 40 MG tablet Commonly known as: LOTENSIN TAKE 1 TABLET BY MOUTH EVERY DAY   CENTRUM PO Take 1 tablet by mouth daily.   cyanocobalamin 1000 MCG tablet Commonly known as: VITAMIN B12 Take by mouth.   D3 ADULT PO Take by mouth.   ezetimibe 10 MG tablet Commonly known as: ZETIA Take 1 tablet (10 mg total) by mouth daily.   levothyroxine 88 MCG tablet Commonly known as: SYNTHROID Take 1 tablet (88 mcg total) by mouth daily before breakfast.   metoprolol succinate 50 MG 24 hr tablet Commonly known as: TOPROL-XL TAKE 1 TABLET BY MOUTH DAILY. TAKE WITH OR IMMEDIATELY FOLLOWING A MEAL. What changed: additional instructions    Multi-Vitamin tablet Take 1 tablet by mouth daily.   NON FORMULARY Take 2,500 Units by mouth. daily   potassium citrate 10 MEQ (1080 MG) SR tablet Commonly known as: UROCIT-K Take 10 mEq by mouth daily.   TYLENOL ARTHRITIS PAIN PO Take by mouth.   Vascepa 1 g capsule Generic drug: icosapent Ethyl Take 2 capsules (2 g total) by mouth 2 (two) times daily.          REVIEW OF SYSTEMS: A comprehensive ROS was conducted  with the patient and is negative except as per HPI    OBJECTIVE:  VS: BP 124/80 (BP Location: Left Arm, Patient Position: Sitting, Cuff Size: Small)   Pulse 66   Ht 5\' 5"  (1.651 m)   Wt 155 lb (70.3 kg)   SpO2 95%   BMI 25.79 kg/m    Wt Readings from Last 3 Encounters:  07/19/23 155 lb (70.3 kg)  05/04/23 152 lb (68.9 kg)  04/27/23 152 lb (68.9 kg)     EXAM: General: Pt appears well and is in NAD  Neck:  Thyroid: Thyroid size normal.  No goiter or nodules appreciated.   Lungs: Clear with good BS bilat with no rales, rhonchi, or wheezes  Heart: Auscultation: RRR.  Extremities:  BL LE: No pretibial edema   Mental Status: Judgment, insight: Intact Orientation: Oriented to time, place, and person Memory: Intact for recent and remote events Mood and affect: No depression, anxiety, or agitation     DATA REVIEWED:  Latest Reference Range & Units 07/19/23 10:00  Sodium 135 - 145 mEq/L 140  Potassium 3.5 - 5.1 mEq/L 3.9  Chloride 96 - 112 mEq/L 103  CO2 19 - 32 mEq/L 27  Glucose 70 - 99 mg/dL 161 (H)  BUN 6 - 23 mg/dL 19  Creatinine 0.96 - 0.45 mg/dL 4.09  Calcium 8.4 - 81.1 mg/dL 9.8  Alkaline Phosphatase 39 - 117 U/L 143 (H)  Albumin 3.5 - 5.2 g/dL 4.8  AST 0 - 37 U/L 21  ALT 0 - 35 U/L 18  Total Protein 6.0 - 8.3 g/dL 8.1  Total Bilirubin 0.2 - 1.2 mg/dL 0.6  GFR >91.47 mL/min 59.29 (L)    Latest Reference Range & Units 07/19/23 10:00  Cortisol, Plasma ug/dL 82.9  FSH mIU/ML 56.2  Prolactin ng/mL 6.8  Glucose 70 - 99 mg/dL 130 (H)   TSH 8.65 - 7.84 uIU/mL 6.03 (H)  (H): Data is abnormally high     Latest Reference Range & Units 04/27/23 13:56 05/21/23 10:40  Alkaline phosphatase (APISO) 37 - 153 U/L 146   BONE FRACTION 14 - 68 % 69 (H)   INTESTINAL FRAC. 0 - 18 % 0   LIVER FRACTION 18 - 85 % 31   VITD 30.00 - 100.00 ng/mL  33.47  Vitamin B12 200 - 1,100 pg/mL 605       Latest Reference Range & Units 05/21/23 10:40  PTH, Intact 16 - 77 pg/mL 45  TSH 0.35 - 5.50 uIU/mL 0.70      MR brain 04/12/2021  Brain:   Cerebral volume is normal.   0.7 x 1.3 cm (AP x TV) precontrast T1 hyperintense and hypoenhancing lesion within the mid-to-posterior the pituitary gland (for instance as seen on series 2, image 12) (series 14, image 26).   Mild multifocal T2/FLAIR hyperintensity within the cerebral white matter and pons, nonspecific but compatible with chronic small vessel ischemic disease.   No cerebellopontine angle or internal auditory canal mass is demonstrated. Unremarkable appearance of the seventh and eighth cranial nerves bilaterally.   There is no acute infarct.   No extra-axial fluid collection.   No midline shift.   Vascular: Maintained flow voids within the proximal large arterial vessels.   Skull and upper cervical spine: No focal suspicious marrow lesion.   Sinuses/Orbits: Visualized orbits show no acute finding. No significant paranasal sinus disease.   Impression #2 will be called to the ordering clinician or representative by the Radiologist Assistant, and communication documented in the PACS  or [employer].   IMPRESSION: No cerebellopontine angle or internal auditory canal mass.   1.3 x 0.7 cm precontrast T1 hyperintense and hypoenhancing lesion within the mid-to-posterior pituitary gland. This could reflect a pituitary adenoma containing non-acute hemorrhage or proteinaceous material, or a Rathke's cleft cyst. Correlate with relevant endocrinologic laboratory values.  Additionally, consider a contrast-enhanced pituitary protocol brain MRI for further evaluation.   Mild chronic small vessel ischemic changes within the cerebral white matter and pons.      ASSESSMENT/PLAN/RECOMMENDATIONS:   Pituitary macroadenoma  -No local symptoms -No clinical evidence of hyper or hypopituitarism -She had a pituitary MRI through Dr. Yetta Barre office, per patient this is stable, these records are not available today -She is up-to-date on visual field testing, emphasized the importance of doing this annually -Prolactin, cortisol, FSH are appropriate   2.  Postoperative hypothyroidism:  -Patient is clinically euthyroid -She recently had low TSH, PCP decreased the dose from 100 mcg to 88 mcg daily - TSH elevated , will increase levothyroxine as below   Medication Change levothyroxine 88 mcg , 1.5 tabs on Sundays, 1 tablet Monday through Saturday    3.  History of thyroid cancer:   -She is status post total thyroidectomy in 2000, records not available and unknown type of thyroid cancer -No history of radioactive iodine ablation -No local symptoms -TG, TG AB pending   4.  Elevated Alkaline Phosphatase:  -72% of bone origin, normal hepatic origin N0 intestinal origin -Patient with history of left hip prosthesis, back surgery and bilateral knee surgeries -We did discuss hyperthyroid as a cause for increased bone resorption (she did have a low TSH a few months ago)  -Will proceed with DXA scan -Recent PTH, calcium, and vitamin D are normal -I have emphasized the importance of optimizing calcium and vitamin D intake  Medication Continue multivitamin daily Start OTC calcium/vitamin D 600 mg daily   Follow-up in 6 months  Signed electronically by: Lyndle Herrlich, MD  [facility] Endocrinology  [facility] [address] [place] [place], [place] [address] Phone: [phone] FAX: [phone]   CC: Olive Bass,  FNP [address] [place] [address] Phone: [phone] Fax: [phone]   Return to Endocrinology clinic as below: Future Appointments  Date Time Provider Department Center  07/24/2023 10:30 AM [facility] DEXA 1 [facility]  09/06/2023  9:40 AM Hilty, Lisette Abu, MD [facility] None

## 2023-07-20 ENCOUNTER — Encounter: Payer: Self-pay | Admitting: Internal Medicine

## 2023-07-20 MED ORDER — LEVOTHYROXINE SODIUM 88 MCG PO TABS: 88.0000 ug | ORAL_TABLET | ORAL | 3 refills | Status: DC

## 2023-07-23 NOTE — Addendum Note (Signed)
Addended by: Scarlette Shorts on: 07/23/2023 02:01 PM   Modules accepted: Orders

## 2023-07-24 ENCOUNTER — Ambulatory Visit (INDEPENDENT_AMBULATORY_CARE_PROVIDER_SITE_OTHER)
Admission: RE | Admit: 2023-07-24 | Discharge: 2023-07-24 | Disposition: A | Discharge status: Home / Self Care | Payer: Medicare Other | Source: Ambulatory Visit | Attending: Internal Medicine | Admitting: Internal Medicine

## 2023-07-24 ENCOUNTER — Inpatient Hospital Stay
Admission: RE | Admit: 2023-07-24 | Discharge: 2023-07-24 | Discharge status: Home / Self Care | Payer: Medicare Other | Source: Ambulatory Visit | Attending: Internal Medicine | Admitting: Internal Medicine

## 2023-07-24 DIAGNOSIS — Z85850 Personal history of malignant neoplasm of thyroid: Secondary | ICD-10-CM | POA: Diagnosis not present

## 2023-07-24 DIAGNOSIS — R748 Abnormal levels of other serum enzymes: Secondary | ICD-10-CM

## 2023-07-24 DIAGNOSIS — M810 Age-related osteoporosis without current pathological fracture: Secondary | ICD-10-CM | POA: Diagnosis not present

## 2023-07-25 LAB — ACTH: C206 ACTH: 18 pg/mL (ref 6–50)

## 2023-07-25 LAB — PROLACTIN: Prolactin: 6.8 ng/mL

## 2023-07-25 LAB — THYROGLOBULIN LEVEL: Thyroglobulin: 2 ng/mL — ABNORMAL LOW

## 2023-07-25 LAB — THYROGLOBULIN ANTIBODY: Thyroglobulin Ab: <1 [IU]/mL (ref <=1)

## 2023-07-30 ENCOUNTER — Telehealth: Payer: Self-pay | Admitting: Internal Medicine

## 2023-07-30 DIAGNOSIS — M810 Age-related osteoporosis without current pathological fracture: Secondary | ICD-10-CM | POA: Insufficient documentation

## 2023-07-30 MED ORDER — DENOSUMAB 60 MG/ML ~~LOC~~ SOSY
60.0000 mg | PREFILLED_SYRINGE | Freq: Once | SUBCUTANEOUS | Status: AC
  Administered 2023-10-16: 60 mg via SUBCUTANEOUS

## 2023-07-30 NOTE — Telephone Encounter (Signed)
Prolia new start

## 2023-07-30 NOTE — Telephone Encounter (Signed)
Discussed abnormal bone density results with the patient on 07/30/2023 at 1420   Patient with osteoporosis at the distal radius, I have recommended Prolia, discussed side effect of hypocalcemia and rash  Patient is concerned that she only has 1 kidney, I assured the patient that this is safe with renal issues     I have assessed the importance of optimizing calcium and vitamin D intake  Patient with multiple arthralgias, I have advised the patient to check with Dr. Eulah Pont on the pain for osteoarthritis.

## 2023-07-31 ENCOUNTER — Other Ambulatory Visit: Payer: Self-pay | Admitting: Family

## 2023-07-31 DIAGNOSIS — F32A Depression, unspecified: Secondary | ICD-10-CM

## 2023-08-03 ENCOUNTER — Other Ambulatory Visit: Payer: Self-pay | Admitting: Family

## 2023-08-03 ENCOUNTER — Encounter: Payer: Self-pay | Admitting: Family

## 2023-08-03 MED ORDER — CELECOXIB 200 MG PO CAPS: ORAL_CAPSULE | ORAL | 1 refills | Status: DC

## 2023-08-04 NOTE — Telephone Encounter (Signed)
Prolia VOB initiated via [email or web address]  Next Prolia inj DUE: new start

## 2023-08-08 DIAGNOSIS — N200 Calculus of kidney: Secondary | ICD-10-CM | POA: Diagnosis not present

## 2023-08-10 ENCOUNTER — Encounter: Payer: Self-pay | Admitting: Internal Medicine

## 2023-08-13 DIAGNOSIS — I10 Essential (primary) hypertension: Secondary | ICD-10-CM | POA: Diagnosis not present

## 2023-08-13 DIAGNOSIS — R931 Abnormal findings on diagnostic imaging of heart and coronary circulation: Secondary | ICD-10-CM | POA: Diagnosis not present

## 2023-08-13 DIAGNOSIS — I341 Nonrheumatic mitral (valve) prolapse: Secondary | ICD-10-CM | POA: Diagnosis not present

## 2023-08-13 DIAGNOSIS — I700 Atherosclerosis of aorta: Secondary | ICD-10-CM | POA: Diagnosis not present

## 2023-08-13 DIAGNOSIS — E785 Hyperlipidemia, unspecified: Secondary | ICD-10-CM | POA: Diagnosis not present

## 2023-08-13 LAB — LIPID PANEL
Chol/HDL Ratio: 8.9 {ratio} — ABNORMAL HIGH (ref 0.0–4.4)
Cholesterol, Total: 348 mg/dL — ABNORMAL HIGH (ref 100–199)
HDL: 39 mg/dL — ABNORMAL LOW (ref >39)
LDL Chol Calc (NIH): 207 mg/dL — ABNORMAL HIGH (ref 0–99)
Triglycerides: 476 mg/dL — ABNORMAL HIGH (ref 0–149)
VLDL Cholesterol Cal: 102 mg/dL — ABNORMAL HIGH (ref 5–40)

## 2023-08-13 LAB — HEPATIC FUNCTION PANEL
ALT: 19 [IU]/L (ref 0–32)
AST: 24 [IU]/L (ref 0–40)
Albumin: 4.7 g/dL (ref 3.8–4.8)
Alkaline Phosphatase: 163 [IU]/L — ABNORMAL HIGH (ref 44–121)
Bilirubin Total: 0.5 mg/dL (ref 0.0–1.2)
Bilirubin, Direct: 0.15 mg/dL (ref 0.00–0.40)
Total Protein: 7.8 g/dL (ref 6.0–8.5)

## 2023-08-13 NOTE — Telephone Encounter (Signed)
APPROVED PA# [id number] Valid 08/13/23-08/12/24

## 2023-08-13 NOTE — Telephone Encounter (Addendum)
 Prior Auth REQUIRED for PROLIA PA PROCESS DETAILS: Please complete the prior authorization form located at [email or web address] or call [phone].

## 2023-08-13 NOTE — Telephone Encounter (Signed)
Pt ready for scheduling on or after 08/14/23  Out-of-pocket cost due at time of visit: $346  Primary: [employer] A[employer] Medicare HMO POS Prolia co-insurance: 20% (approximately $320.99) Admin fee co-insurance: 20% (approximately $25)  Deductible: does not apply  Prior Auth APPROVED PA# [id number] Valid 08/13/23-08/12/24  Secondary: N/A Prolia co-insurance:  Admin fee co-insurance:  Deductible:  Prior Auth:  PA# Valid:   ** This summary of benefits is an estimation of the patient's out-of-pocket cost. Exact cost may vary based on individual plan coverage.

## 2023-08-15 DIAGNOSIS — N200 Calculus of kidney: Secondary | ICD-10-CM | POA: Diagnosis not present

## 2023-08-16 ENCOUNTER — Telehealth: Payer: Self-pay

## 2023-08-16 NOTE — Telephone Encounter (Addendum)
Called patient regarding results. Patient had understanding of results.----- Message from [name] sent at 08/15/2023  2:36 PM EST ----- Recent labs show mild improvement in cholesterol, however, cholesterol remains elevated above goal.  Follow-up with Dr. Rennis Golden as scheduled to discuss ongoing management.  Thank you-EM

## 2023-08-29 DIAGNOSIS — M069 Rheumatoid arthritis, unspecified: Secondary | ICD-10-CM

## 2023-08-29 HISTORY — DX: Rheumatoid arthritis, unspecified: M06.9

## 2023-08-29 NOTE — Telephone Encounter (Signed)
 VOB initiated for 2025.

## 2023-09-04 NOTE — Telephone Encounter (Addendum)
 Medical Buy and Bill  Prior Authorization REQUIRED for PROLIA   PA PROCESS DETAILS: Please complete the prior authorization form located at  [email or web address] or call [phone].

## 2023-09-06 ENCOUNTER — Ambulatory Visit: Payer: Medicare Other | Attending: Internal Medicine | Admitting: Internal Medicine

## 2023-09-06 ENCOUNTER — Other Ambulatory Visit (HOSPITAL_COMMUNITY): Payer: Self-pay

## 2023-09-06 ENCOUNTER — Telehealth: Payer: Self-pay | Admitting: Pharmacy Technician

## 2023-09-06 ENCOUNTER — Encounter: Payer: Self-pay | Admitting: Internal Medicine

## 2023-09-06 VITALS — BP 143/72 | HR 80 | Ht 65.0 in | Wt 157.8 lb

## 2023-09-06 DIAGNOSIS — M791 Myalgia, unspecified site: Secondary | ICD-10-CM

## 2023-09-06 DIAGNOSIS — I2510 Atherosclerotic heart disease of native coronary artery without angina pectoris: Secondary | ICD-10-CM | POA: Diagnosis not present

## 2023-09-06 DIAGNOSIS — E781 Pure hyperglyceridemia: Secondary | ICD-10-CM

## 2023-09-06 DIAGNOSIS — T466X5D Adverse effect of antihyperlipidemic and antiarteriosclerotic drugs, subsequent encounter: Secondary | ICD-10-CM

## 2023-09-06 DIAGNOSIS — E785 Hyperlipidemia, unspecified: Secondary | ICD-10-CM | POA: Diagnosis not present

## 2023-09-06 MED ORDER — REPATHA SURECLICK 140 MG/ML ~~LOC~~ SOAJ: 140.0000 mg | SUBCUTANEOUS | 11 refills | Status: DC

## 2023-09-06 NOTE — Patient Instructions (Signed)
 Medication Instructions:  Dr. Mona recommends Repatha  Sureclick 140mg /mL (PCSK9). This is an injectable cholesterol medication self-administered once every 14 days. This medication will likely need prior approval with your insurance company, which we will work on. If the medication is not approved initially, we may need to do an appeal with your insurance.   Administer medication in area of fatty tissue such as abdomen, outer thigh, back of upper arm - and rotate site with each injection Store medication in refrigerator until ready to administer - allow to sit at room temp for 30 mins - 1 hour prior to injection Dispose of medication in a SHARPS container - your pharmacy should be able to direct you on this and proper disposal   Patient Assistance:    These foundations have funds at various times.   The [employer] Foundation: https://www.panfoundation.org/disease-funds/hypercholesterolemia/ -- can sign up for wait list  The [facility] offers assistance to help pay for medication copays.  They will cover copays for all cholesterol lowering meds, including statins, fibrates, omega-3 fish oils like Vascepa , ezetimibe , Repatha , Praluent, Nexletol, Nexlizet.  The cards are usually good for $2,500 or 12 months, whichever comes first. Our fax # is [phone] (you will need this to apply) Go to healthwellfoundation.org Click on "Apply Now" Answer questions as to whom is applying (patient or representative) Your disease fund will be "hypercholesterolemia - Medicare access" They will ask questions about finances and which medications you are taking for cholesterol When you submit, the approval is usually within minutes.  You will need to print the card information from the site You will need to show this information to your pharmacy, they will bill your Medicare Part D plan first -then bill [employer] Well --for the copay.   You can also call them at [phone], although the hold times can be quite  long.   *If you need a refill on your cardiac medications before your next appointment, please call your pharmacy*   Lab Work: FASTING lab work to check cholesterol in 3-4 months ** complete about ONE WEEK before your next appointment  If you have labs (blood work) drawn today and your tests are completely normal, you will receive your results only by: MyChart Message (if you have MyChart) OR A paper copy in the mail If you have any lab test that is abnormal or we need to change your treatment, we will call you to review the results.   Follow-Up: At [facility], you and your health needs are our priority.  As part of our continuing mission to provide you with exceptional heart care, we have created designated Provider Care Teams.  These Care Teams include your primary Cardiologist (physician) and Advanced Practice Providers (APPs -  Physician Assistants and Nurse Practitioners) who all work together to provide you with the care you need, when you need it.  We recommend signing up for the patient portal called MyChart.  Sign up information is provided on this After Visit Summary.  MyChart is used to connect with patients for Virtual Visits (Telemedicine).  Patients are able to view lab/test results, encounter notes, upcoming appointments, etc.  Non-urgent messages can be sent to your provider as well.   To learn more about what you can do with MyChart, go to [email or web address].    Your next appointment:    3-4 months with Dr. Mona Other Instructions   1st Floor: - Lobby - Registration  - Pharmacy  - Lab - Cafe  2nd Floor: - PV Lab -  Diagnostic Testing (echo, CT, nuclear med)  3rd Floor: - Vacant  4th Floor: - TCTS (cardiothoracic surgery) - AFib Clinic - Structural Heart Clinic - Vascular Surgery  - Vascular Ultrasound  5th Floor: - [facility] Card[facility] (general and EP) - Clinical Pharmacy for coumadin, hypertension, lipid, weight-loss medications,  and med management appointments    Valet parking services will be available as well.

## 2023-09-06 NOTE — Telephone Encounter (Signed)
 Update sent to patient via MyChart

## 2023-09-06 NOTE — Telephone Encounter (Signed)
 Pharmacy Patient Advocate Encounter   Received notification from Physician's Office that prior authorization for repatha  is required/requested.   Insurance verification completed.   The patient is insured through  [employer]  .   Per test claim: The current 09/06/23 day co-pay is, $302.00 one month (DEDUCTIBLE).  No PA needed at this time. This test claim was processed through [facility]- copay amounts may vary at other pharmacies due to pharmacy/plan contracts, or as the patient moves through the different stages of their insurance plan.    PA on file ends 08/27/24

## 2023-09-06 NOTE — Telephone Encounter (Signed)
-----   Message from Nurse Eileen Stanford E sent at 09/06/2023 10:30 AM EST ----- Regarding: PA for Repatha needed Hello,   This patient needs a PA for Repatha.   Thanks

## 2023-09-06 NOTE — Progress Notes (Signed)
 LIPID CLINIC CONSULT NOTE  Chief Complaint:  Manage dyslipidemia  Primary Care Physician: Jason Leita Repine, FNP  Primary Cardiologist:  Vinie JAYSON Maxcy, MD  HPI:  [name] is a 78 y.o. female who is being seen today for the evaluation of dyslipidemia at the request of Jason Leita Repine,*.  This is a pleasant 78 year old female kindly referred for evaluation management of dyslipidemia.  She notes a longstanding history of high cholesterol most recently her lipid showed total cholesterol 279, triglycerides 499, HDL 44 and direct LDL 118.  Currently she is tolerating rosuvastatin  20 mg daily.  Her other medical problems include hypertension, hypothyroidism and heart murmur with possible mitral valve prolapse.   09/06/2023  [name] is seen today in follow-up.  I last saw her a number of years ago at which time we had ordered a calcium  score.  This was elevated at 61, 57th percentile for age and sex matched controls.  Her lipids have also been elevated.  I had recommended a PCSK9 inhibitor at that time but she was never started on the medication.  More recently her cholesterol has reported being elevated with total 348, triglycerides 476, HDL 39 and LDL 207.  She has remained on Vascepa  however the cost of the medicine has become expensive.  In addition she is on ezetimibe  10 mg daily.  She cannot take statins and is intolerant.  PMHx:  Past Medical History:  Diagnosis Date   Adenomatous colon polyp    Allergy Years ago   Anxiety Years ago   Arthritis    Cataract    Chronic kidney disease    Left kidney removed   Complication of anesthesia    Depression Years ago   Heart murmur    MVP   Hyperlipidemia    Hypertension    Hypothyroidism    PONV (postoperative nausea and vomiting)    Sleep apnea Last few years   Get up 2 times a night to go to bathroom   Thyroid  disease     Past Surgical History:  Procedure Laterality Date   ABDOMINAL HYSTERECTOMY      APPENDECTOMY     BACK SURGERY     BREAST EXCISIONAL BIOPSY Right 1983   COLONOSCOPY W/ POLYPECTOMY  2003,2006,2013   [name]   DIAGNOSTIC LAPAROSCOPY  1979   exploratory to get pregnant   DILATION AND CURETTAGE OF UTERUS     X2   EYE SURGERY     JOINT REPLACEMENT     NEPHRECTOMY  2010   Left for 9.8cm benign complex cyst   SPINE SURGERY     THYROIDECTOMY  2000   Dr Queen   TONSILLECTOMY     TOTAL ABDOMINAL HYSTERECTOMY W/ BILATERAL SALPINGOOPHORECTOMY  2003   [name] for dysfunctional menses   TOTAL HIP ARTHROPLASTY Left 06/24/2019   Procedure: TOTAL HIP ARTHROPLASTY ANTERIOR APPROACH;  Surgeon: Beverley Evalene BIRCH, MD;  Location: [facility];  Service: Orthopedics;  Laterality: Left;   TUBAL LIGATION      FAMHx:  Family History  Problem Relation Age of Onset   Hyperlipidemia Mother    Transient ischemic attack Mother 52   Thyroid  disease Mother        hyperthyroid   ADD / ADHD Mother    Heart disease Mother    Transient ischemic attack Father        in 28s   Coronary artery disease Father        stents @ 27   Nephrolithiasis Father  Urolithiasis Sister    Diabetes Neg Hx    Colon cancer Neg Hx    Rectal cancer Neg Hx    Stomach cancer Neg Hx    Esophageal cancer Neg Hx     SOCHx:   reports that she quit smoking about 48 years ago. Her smoking use included cigarettes. She started smoking about 52 years ago. She has a 2 pack-year smoking history. She has never used smokeless tobacco. She reports that she does not currently use alcohol. She reports that she does not use drugs.  ALLERGIES:  Allergies  Allergen Reactions   Atorvastatin      myalgias    Simvastatin     myalgias   Codeine Sulfate     made me jumpy   Erythromycin Nausea Only    ROS: Pertinent items noted in HPI and remainder of comprehensive ROS otherwise negative.  HOME MEDS: Current Outpatient Medications on File Prior to Visit  Medication Sig Dispense Refill   Acetaminophen  (TYLENOL   ARTHRITIS PAIN PO) Take by mouth.     ALPRAZolam  (XANAX ) 0.25 MG tablet TAKE 2 TABLETS BY MOUTH AT BEDTIME AS NEEDED FOR ANXIETY 60 tablet 1   amLODipine  (NORVASC ) 10 MG tablet Take 1 tablet (10 mg total) by mouth daily. 90 tablet 3   benazepril  (LOTENSIN ) 40 MG tablet TAKE 1 TABLET BY MOUTH EVERY DAY 90 tablet 3   celecoxib  (CELEBREX ) 200 MG capsule TAKE 1 CAPSULE BY MOUTH EVERY DAY AS NEEDED 30 capsule 1   Cholecalciferol (D3 ADULT PO) Take by mouth.     cyanocobalamin  (VITAMIN B12) 1000 MCG tablet Take by mouth.     ezetimibe  (ZETIA ) 10 MG tablet Take 1 tablet (10 mg total) by mouth daily. 90 tablet 3   icosapent  Ethyl (VASCEPA ) 1 g capsule Take 2 capsules (2 g total) by mouth 2 (two) times daily. 120 capsule 11   levothyroxine  (SYNTHROID ) 88 MCG tablet Take 1 tablet (88 mcg total) by mouth as directed. 98 tablet 3   metoprolol  succinate (TOPROL -XL) 50 MG 24 hr tablet TAKE 1 TABLET BY MOUTH DAILY. TAKE WITH OR IMMEDIATELY FOLLOWING A MEAL. (Patient taking differently: Take 50 mg by mouth daily. TAKE WITH OR IMMEDIATELY FOLLOWING A MEAL.Takes 0.5 daily) 90 tablet 3   Multiple Vitamin (MULTI-VITAMIN) tablet Take 1 tablet by mouth daily.     NON FORMULARY Take 2,500 Units by mouth. daily     potassium citrate  (UROCIT-K ) 10 MEQ (1080 MG) SR tablet Take 10 mEq by mouth daily.     Multiple Vitamins-Minerals (CENTRUM PO) Take 1 tablet by mouth daily. (Patient not taking: Reported on 09/06/2023)     Current Facility-Administered Medications on File Prior to Visit  Medication Dose Route Frequency Provider Last Rate Last Admin   denosumab  (PROLIA ) injection 60 mg  60 mg Subcutaneous Once Shamleffer, Ibtehal Jaralla, MD        LABS/IMAGING: No results found for this or any previous visit (from the past 48 hours). No results found.  LIPID PANEL:    Component Value Date/Time   CHOL 348 (H) 08/13/2023 1057   TRIG 476 (H) 08/13/2023 1057   TRIG 152 (H) 07/13/2006 1126   HDL 39 (L) 08/13/2023 1057    CHOLHDL 8.9 (H) 08/13/2023 1057   CHOLHDL 7.7 (H) 04/27/2023 1356   VLDL 78.6 (H) 01/26/2021 1149   LDLCALC 207 (H) 08/13/2023 1057   LDLCALC  04/27/2023 1356     Comment:     . LDL cholesterol not calculated. Triglyceride levels greater  than 400 mg/dL invalidate calculated LDL results. . Reference range: <100 . Desirable range <100 mg/dL for primary prevention;   <70 mg/dL for patients with CHD or diabetic patients  with > or = 2 CHD risk factors. SABRA LDL-C is now calculated using the Martin-Hopkins  calculation, which is a validated novel method providing  better accuracy than the Friedewald equation in the  estimation of LDL-C.  Gladis APPLETHWAITE et al. SANDREA. 7986;689(80): 2061-2068  (http://education.[employer].com/faq/FAQ164)    LDLDIRECT 89.0 01/26/2021 1149    WEIGHTS: Wt Readings from Last 3 Encounters:  09/06/23 157 lb 12.8 oz (71.6 kg)  07/19/23 155 lb (70.3 kg)  05/04/23 152 lb (68.9 kg)    VITALS: Pulse 80   Ht 5' 5 (1.651 m)   Wt 157 lb 12.8 oz (71.6 kg)   SpO2 99%   BMI 26.26 kg/m   EXAM: General appearance: alert and no distress Neck: no carotid bruit, no JVD and thyroid  not enlarged, symmetric, no tenderness/mass/nodules Lungs: clear to auscultation bilaterally Heart: regular rate and rhythm, S1, S2 normal, no murmur, click, rub or gallop Abdomen: soft, non-tender; bowel sounds normal; no masses,  no organomegaly Extremities: extremities normal, atraumatic, no cyanosis or edema Pulses: 2+ and symmetric Skin: Skin color, texture, turgor normal. No rashes or lesions Neurologic: Mental status: Alert, oriented, thought content appropriate Psych: Pleasant  EKG: Deferred  ASSESSMENT: Mixed dyslipidemia with high triglycerides CAC score of 61, 57th percentile for age and sex matched controls (2021) Statin intolerant-myalgias  PLAN: 1.   [name] has a marked dyslipidemia with high triglycerides.  She cannot tolerate statins.  She is on ezetimibe  in  Vascepa .  I had previously recommended a PCSK9 inhibitor in 2021 however she never started on this medicine.  She is back today to discuss this and is agreeable to trying Repatha .  Will reach out for prior authorization for this.  Plan a lipid NMR and LP(a) after 3 to 4 months on therapy and follow-up at that time.  Vinie KYM Maxcy, MD, [facility], FACP  [place]  [facility]  Medical Director of the [facility] &  Cardiovascular Risk Reduction Clinic Diplomate of the American Board of Clinical Lipidology Attending Cardiologist  Direct Dial: [phone]  Fax: [phone]  Website:  [email or web address]  Vinie BROCKS Tyiana Hill 09/06/2023, 10:11 AM

## 2023-09-06 NOTE — Addendum Note (Signed)
 Addended by: Lindell Spar on: 09/06/2023 01:09 PM   Modules accepted: Orders

## 2023-09-06 NOTE — Telephone Encounter (Signed)
 Rx(s) sent to pharmacy electronically.

## 2023-09-09 NOTE — Telephone Encounter (Signed)
 [employer] and [employer]te Stable  Prior Authorization on file and valid PA# [id number] Valid: 08/13/23-08/12/24

## 2023-09-10 ENCOUNTER — Telehealth: Payer: Self-pay | Admitting: Internal Medicine

## 2023-09-10 DIAGNOSIS — D225 Melanocytic nevi of trunk: Secondary | ICD-10-CM | POA: Diagnosis not present

## 2023-09-10 DIAGNOSIS — L905 Scar conditions and fibrosis of skin: Secondary | ICD-10-CM | POA: Diagnosis not present

## 2023-09-10 DIAGNOSIS — L720 Epidermal cyst: Secondary | ICD-10-CM | POA: Diagnosis not present

## 2023-09-10 DIAGNOSIS — Z85828 Personal history of other malignant neoplasm of skin: Secondary | ICD-10-CM | POA: Diagnosis not present

## 2023-09-10 DIAGNOSIS — D2239 Melanocytic nevi of other parts of face: Secondary | ICD-10-CM | POA: Diagnosis not present

## 2023-09-10 DIAGNOSIS — D485 Neoplasm of uncertain behavior of skin: Secondary | ICD-10-CM | POA: Diagnosis not present

## 2023-09-10 DIAGNOSIS — L821 Other seborrheic keratosis: Secondary | ICD-10-CM | POA: Diagnosis not present

## 2023-09-10 DIAGNOSIS — D2262 Melanocytic nevi of left upper limb, including shoulder: Secondary | ICD-10-CM | POA: Diagnosis not present

## 2023-09-10 DIAGNOSIS — D2272 Melanocytic nevi of left lower limb, including hip: Secondary | ICD-10-CM | POA: Diagnosis not present

## 2023-09-10 DIAGNOSIS — D2261 Melanocytic nevi of right upper limb, including shoulder: Secondary | ICD-10-CM | POA: Diagnosis not present

## 2023-09-10 DIAGNOSIS — L570 Actinic keratosis: Secondary | ICD-10-CM | POA: Diagnosis not present

## 2023-09-10 NOTE — Telephone Encounter (Signed)
 Pt c/o medication issue:  1. Name of Medication:   Evolocumab  (REPATHA  SURECLICK) 140 MG/ML SOAJ   2. How are you currently taking this medication (dosage and times per day)?   3. Are you having a reaction (difficulty breathing--STAT)?   4. What is your medication issue?   Patient stated she received her medication but will need assistance in self-injecting.

## 2023-09-12 NOTE — Telephone Encounter (Signed)
 Call N/A LVM to call back.

## 2023-09-13 NOTE — Telephone Encounter (Signed)
Spoke to pt, her daughter helped her to figure out proper administration steps for Repatha. She no longer needing help from [place].

## 2023-09-20 ENCOUNTER — Encounter: Payer: Self-pay | Admitting: Internal Medicine

## 2023-09-22 ENCOUNTER — Other Ambulatory Visit: Payer: Self-pay | Admitting: Family

## 2023-09-22 DIAGNOSIS — I10 Essential (primary) hypertension: Secondary | ICD-10-CM

## 2023-10-01 NOTE — Telephone Encounter (Addendum)
Patient is ready for scheduling on or after 08/29/23 BUY AND BILL  Out-of-pocket cost due at time of visit: $351.87  Primary: UHC AARP Medicare Adv HMO=POS Prolia co-insurance: 20% (approximately $331.87) Admin fee co-insurance: $20  Deductible: does not apply  Prior Auth: APPROVED PA# [id number] Valid: 08/13/23-08/12/24  Secondary: N/A Prolia co-insurance:  Admin fee co-insurance:  Deductible:  Prior Auth:  PA# Valid:   ** This summary of benefits is an estimation of the patient's out-of-pocket cost. Exact cost may vary based on individual plan coverage.

## 2023-10-02 NOTE — Telephone Encounter (Signed)
LMx1 for patient to call office to schedule the next Prolia injection.

## 2023-10-16 ENCOUNTER — Ambulatory Visit (INDEPENDENT_AMBULATORY_CARE_PROVIDER_SITE_OTHER): Payer: Medicare Other

## 2023-10-16 VITALS — BP 136/84 | HR 84 | Ht 65.0 in | Wt 158.0 lb

## 2023-10-16 DIAGNOSIS — M810 Age-related osteoporosis without current pathological fracture: Secondary | ICD-10-CM

## 2023-10-16 MED ORDER — DENOSUMAB 60 MG/ML ~~LOC~~ SOSY: 60.0000 mg | PREFILLED_SYRINGE | SUBCUTANEOUS | Status: AC

## 2023-10-16 NOTE — Progress Notes (Addendum)
After obtaining consent, and per orders of Dr.Shamleffer , injection of Prolia 60mg given by Cardelia Sassano L [facility] [place]. Patient instructed to remain in clinic for 20 minutes afterwards, and to report any adverse reaction to me immediately.  

## 2023-10-27 ENCOUNTER — Other Ambulatory Visit: Payer: Self-pay | Admitting: Family

## 2023-10-31 NOTE — Telephone Encounter (Signed)
 Last Prolia inj 10/16/23 Next Prolia inj due 04/15/24

## 2023-11-05 ENCOUNTER — Telehealth: Payer: Self-pay | Admitting: Family

## 2023-11-05 NOTE — Telephone Encounter (Signed)
 Copied from CRM [id number]. Topic: Medicare AWV >> Nov 05, 2023  1:31 PM Payton Doughty wrote: Reason for CRM: Called LVM 11/05/2023 to schedule AWV. Please schedule office or virtual visits.  Verlee Rossetti; Care Guide Ambulatory Clinical Support [place] [facility] Direct Dial: [phone]

## 2023-11-08 ENCOUNTER — Other Ambulatory Visit: Payer: Self-pay | Admitting: Family

## 2023-11-08 DIAGNOSIS — F32A Depression, unspecified: Secondary | ICD-10-CM

## 2023-11-08 NOTE — Telephone Encounter (Signed)
 Requesting: alprazolam 0.25mg   Contract: None UDS: None Last Visit:  04/27/23 Next Visit: None Last Refill: 08/01/23 #60 and 1RF  Please Advise

## 2023-12-19 DIAGNOSIS — E785 Hyperlipidemia, unspecified: Secondary | ICD-10-CM | POA: Diagnosis not present

## 2023-12-20 LAB — NMR, LIPOPROFILE
Cholesterol, Total: 161 mg/dL (ref 100–199)
HDL Particle Number: 40.5 umol/L (ref >=30.5)
HDL-C: 43 mg/dL (ref >39)
LDL Particle Number: 729 nmol/L (ref <1000)
LDL Size: 19.6 nm — ABNORMAL LOW (ref >20.5)
LDL-C (NIH Calc): 67 mg/dL (ref 0–99)
LP-IR Score: 67 — ABNORMAL HIGH (ref <=45)
Small LDL Particle Number: 593 nmol/L — ABNORMAL HIGH (ref <=527)
Triglycerides: 325 mg/dL — ABNORMAL HIGH (ref 0–149)

## 2023-12-20 LAB — LIPOPROTEIN A (LPA): Lipoprotein (a): 124 nmol/L — ABNORMAL HIGH (ref <75.0)

## 2023-12-23 ENCOUNTER — Other Ambulatory Visit: Payer: Self-pay | Admitting: Family

## 2023-12-26 ENCOUNTER — Telehealth: Payer: Self-pay | Admitting: Family

## 2023-12-26 ENCOUNTER — Ambulatory Visit: Payer: Medicare Other | Admitting: Internal Medicine

## 2023-12-26 NOTE — Telephone Encounter (Signed)
 Copied from CRM [id number]. Topic: Medicare AWV >> Dec 26, 2023  9:05 AM Juliana Ocean wrote: Reason for CRM: LVM 12/25/2023 to schedule AWV. Please schedule Virtual or Telehealth visits ONLY.   [name]; Care Guide Ambulatory Clinical Support [place] [facility] Direct Dial: [phone]

## 2023-12-27 ENCOUNTER — Encounter: Payer: Self-pay | Admitting: *Deleted

## 2023-12-28 ENCOUNTER — Encounter: Payer: Self-pay | Admitting: Family

## 2023-12-28 ENCOUNTER — Ambulatory Visit (INDEPENDENT_AMBULATORY_CARE_PROVIDER_SITE_OTHER): Admitting: Family

## 2023-12-28 VITALS — BP 142/80 | HR 99 | Ht 65.0 in | Wt 155.2 lb

## 2023-12-28 DIAGNOSIS — G4700 Insomnia, unspecified: Secondary | ICD-10-CM

## 2023-12-28 DIAGNOSIS — F418 Other specified anxiety disorders: Secondary | ICD-10-CM

## 2023-12-28 MED ORDER — CLONAZEPAM 0.5 MG PO TABS: 0.5000 mg | ORAL_TABLET | Freq: Two times a day (BID) | ORAL | 0 refills | Status: DC | PRN

## 2023-12-28 NOTE — Progress Notes (Signed)
 [name] is a 78 y.o. female with the following history as recorded in EpicCare:  Patient Active Problem List   Diagnosis Date Noted   Age-related osteoporosis without current pathological fracture 07/30/2023   Elevated alkaline phosphatase level 07/19/2023   Osteopenia 07/19/2023   Postoperative hypothyroidism 12/12/2021   Pituitary macroadenoma (HCC) 12/12/2021   Fatigue 12/12/2021   Eustachian tube dysfunction 01/12/2021   Left Achilles tendinitis 08/25/2020   Chronic hip pain after total replacement of left hip joint 06/15/2020   Primary localized osteoarthritis of hip 06/24/2019   Primary osteoarthritis of left hip 06/02/2019   S/P lumbar fusion 04/23/2019   Lumbar spinal stenosis 07/16/2018   Renal cyst 07/16/2018   Left lumbar radiculopathy 06/24/2018   Left hip pain 10/25/2017   Greater trochanteric bursitis of left hip 10/03/2017   Anxiety and depression 05/23/2017   Hyperlipidemia 04/21/2016   Routine general medical examination at a health care facility 01/05/2016   Medicare annual wellness visit, subsequent 01/05/2016   Allergic rhinitis 12/29/2015   Vaginal atrophy 10/22/2013   DJD (degenerative joint disease) of knee 10/18/2012   NEPHRECTOMY, HX OF 05/28/2009   Hypothyroidism, postsurgical 07/18/2007   HYPERLIPIDEMIA 07/18/2007   Essential hypertension 07/18/2007   OSTEOPENIA 07/15/2007   History of colonic polyps 07/15/2007    Current Outpatient Medications  Medication Sig Dispense Refill   Acetaminophen  (TYLENOL  ARTHRITIS PAIN PO) Take by mouth.     amLODipine  (NORVASC ) 10 MG tablet Take 1 tablet (10 mg total) by mouth daily. 90 tablet 3   benazepril  (LOTENSIN ) 40 MG tablet TAKE 1 TABLET BY MOUTH EVERY DAY 90 tablet 3   celecoxib  (CELEBREX ) 200 MG capsule TAKE 1 CAPSULE BY MOUTH EVERY DAY AS NEEDED 30 capsule 1   Cholecalciferol (D3 ADULT PO) Take by mouth.     clonazePAM (KLONOPIN) 0.5 MG tablet Take 1 tablet (0.5 mg total) by mouth 2 (two) times daily  as needed for anxiety. 30 tablet 0   cyanocobalamin  (VITAMIN B12) 1000 MCG tablet Take by mouth.     Evolocumab  (REPATHA  SURECLICK) 140 MG/ML SOAJ Inject 140 mg into the skin every 14 (fourteen) days. 2 mL 11   ezetimibe  (ZETIA ) 10 MG tablet Take 1 tablet (10 mg total) by mouth daily. 90 tablet 3   levothyroxine  (SYNTHROID ) 88 MCG tablet Take 1 tablet (88 mcg total) by mouth as directed. 98 tablet 3   metoprolol  succinate (TOPROL -XL) 50 MG 24 hr tablet TAKE 1 TABLET BY MOUTH EVERY DAY WITH OR IMMEDIATELY FOLLOWING A MEAL 90 tablet 3   Multiple Vitamin (MULTI-VITAMIN) tablet Take 1 tablet by mouth daily.     Multiple Vitamins-Minerals (CENTRUM PO) Take 1 tablet by mouth daily.     NON FORMULARY Take 2,500 Units by mouth. daily     potassium citrate  (UROCIT-K ) 10 MEQ (1080 MG) SR tablet Take 10 mEq by mouth daily.     Current Facility-Administered Medications  Medication Dose Route Frequency Provider Last Rate Last Admin   [START ON 04/13/2024] denosumab  (PROLIA ) injection 60 mg  60 mg Subcutaneous Q6 months Shamleffer, Ibtehal Jaralla, MD        Allergies: Atorvastatin , Simvastatin, Codeine sulfate, and Erythromycin  Past Medical History:  Diagnosis Date   Adenomatous colon polyp    Allergy Years ago   Anxiety Years ago   Arthritis    Cataract    Chronic kidney disease    Left kidney removed   Complication of anesthesia    Depression Years ago   Heart murmur  MVP   Hyperlipidemia    Hypertension    Hypothyroidism    PONV (postoperative nausea and vomiting)    Sleep apnea Last few years   Get up 2 times a night to go to bathroom   Thyroid  disease     Past Surgical History:  Procedure Laterality Date   ABDOMINAL HYSTERECTOMY     APPENDECTOMY     BACK SURGERY     BREAST EXCISIONAL BIOPSY Right 1983   COLONOSCOPY W/ POLYPECTOMY  2003,2006,2013   [name]   DIAGNOSTIC LAPAROSCOPY  1979   exploratory to get pregnant   DILATION AND CURETTAGE OF UTERUS     X2   EYE SURGERY      JOINT REPLACEMENT     NEPHRECTOMY  2010   Left for 9.8cm benign complex cyst   SPINE SURGERY     THYROIDECTOMY  2000   Dr Darcie Easterly   TONSILLECTOMY     TOTAL ABDOMINAL HYSTERECTOMY W/ BILATERAL SALPINGOOPHORECTOMY  2003   [name] for dysfunctional menses   TOTAL HIP ARTHROPLASTY Left 06/24/2019   Procedure: TOTAL HIP ARTHROPLASTY ANTERIOR APPROACH;  Surgeon: Saundra Curl, MD;  Location: [facility];  Service: Orthopedics;  Laterality: Left;   TUBAL LIGATION      Family History  Problem Relation Age of Onset   Hyperlipidemia Mother    Transient ischemic attack Mother 55   Thyroid  disease Mother        hyperthyroid   ADD / ADHD Mother    Heart disease Mother    Transient ischemic attack Father        in 63s   Coronary artery disease Father        stents @ 37   Nephrolithiasis Father    Urolithiasis Sister    Diabetes Neg Hx    Colon cancer Neg Hx    Rectal cancer Neg Hx    Stomach cancer Neg Hx    Esophageal cancer Neg Hx     Social History   Tobacco Use   Smoking status: Former    Current packs/day: 0.00    Average packs/day: 0.5 packs/day for 4.0 years (2.0 ttl pk-yrs)    Types: Cigarettes    Start date: 08/29/1971    Quit date: 08/29/1975    Years since quitting: 48.3   Smokeless tobacco: Never   Tobacco comments:    smoked 1973-1977, up to 1/2 ppd  Substance Use Topics   Alcohol use: Not Currently    Subjective:   Patient is dealing with very difficult personal stressors; unfortunately her son has been arrested recently; patient notes she is not sleeping well even with her Xanax - may get 4 hours per night; "just don't want to face people"; would like to discuss options to help with sleep; does not want to take anxiety medication at this time or meet with therapist at this time but may be open to changes;   Objective:  Vitals:   12/28/23 1107  BP: (!) 142/80  Pulse: 99  SpO2: 98%  Weight: 155 lb 3.2 oz (70.4 kg)  Height: 5\' 5"  (1.651 m)    General: Well  developed, well nourished, in no acute distress  Skin : Warm and dry.  Head: Normocephalic and atraumatic  Lungs: Respirations unlabored;  Neurologic: Alert and oriented; speech intact; face symmetrical; moves all extremities well; CNII-XII intact without focal deficit  Limited physical exam;   Assessment:  1. Situational anxiety   2. Insomnia, unspecified type     Plan:  Patient  is specifically asking for Klonopin which is reasonable- she is not using her Xanax  at this time; Rx for Klonopin 0.5 mg bid prn; she will call back early next week with response- to consider starting SSRI and/ or referral to therapist;  Patient is encouraged to let herself be part of family events and to please let us  know if our office can help in anyway.  Time spent 30 minutes  No follow-ups on file.  No orders of the defined types were placed in this encounter.   Requested Prescriptions   Signed Prescriptions Disp Refills   clonazePAM (KLONOPIN) 0.5 MG tablet 30 tablet 0    Sig: Take 1 tablet (0.5 mg total) by mouth 2 (two) times daily as needed for anxiety.

## 2023-12-28 NOTE — Patient Instructions (Signed)
 Please let us  know what we can do to help. Please try to get some rest this weekend. Please let me hear from you next week.

## 2024-01-02 ENCOUNTER — Other Ambulatory Visit: Payer: Self-pay | Admitting: Family

## 2024-01-02 ENCOUNTER — Encounter: Payer: Self-pay | Admitting: Family

## 2024-01-02 MED ORDER — HYDROXYZINE PAMOATE 25 MG PO CAPS: 25.0000 mg | ORAL_CAPSULE | Freq: Every evening | ORAL | 0 refills | Status: DC | PRN

## 2024-01-17 ENCOUNTER — Other Ambulatory Visit: Payer: Self-pay | Admitting: Family

## 2024-01-17 ENCOUNTER — Encounter: Payer: Self-pay | Admitting: Family

## 2024-01-17 ENCOUNTER — Ambulatory Visit: Payer: Medicare Other | Admitting: Internal Medicine

## 2024-01-17 MED ORDER — ESCITALOPRAM OXALATE 10 MG PO TABS: 10.0000 mg | ORAL_TABLET | Freq: Every day | ORAL | 1 refills | Status: DC

## 2024-01-22 ENCOUNTER — Other Ambulatory Visit: Payer: Self-pay | Admitting: Family

## 2024-01-22 DIAGNOSIS — F419 Anxiety disorder, unspecified: Secondary | ICD-10-CM

## 2024-01-22 MED ORDER — ALPRAZOLAM 0.25 MG PO TABS
0.5000 mg | ORAL_TABLET | Freq: Every evening | ORAL | 0 refills | Status: DC | PRN
Start: 2024-01-22 — End: 2024-02-25

## 2024-02-01 ENCOUNTER — Other Ambulatory Visit: Payer: Self-pay | Admitting: Family

## 2024-02-01 ENCOUNTER — Encounter: Payer: Self-pay | Admitting: Family

## 2024-02-01 MED ORDER — TRAMADOL HCL 50 MG PO TABS: 50.0000 mg | ORAL_TABLET | Freq: Four times a day (QID) | ORAL | 0 refills | Status: AC | PRN

## 2024-02-08 DIAGNOSIS — M7061 Trochanteric bursitis, right hip: Secondary | ICD-10-CM | POA: Diagnosis not present

## 2024-02-09 ENCOUNTER — Other Ambulatory Visit: Payer: Self-pay | Admitting: Family

## 2024-02-22 ENCOUNTER — Other Ambulatory Visit: Payer: Self-pay | Admitting: Family

## 2024-02-22 DIAGNOSIS — F32A Anxiety disorder, unspecified: Secondary | ICD-10-CM

## 2024-02-25 NOTE — Telephone Encounter (Signed)
 Requesting: alprazolam  0.25mg   Contract: None UDS: None Last Visit: 12/28/23 Next Visit: None Last Refill: 01/22/24 #60 and 0RF  Please Advise

## 2024-03-19 ENCOUNTER — Ambulatory Visit

## 2024-03-25 NOTE — Telephone Encounter (Signed)
 Prolia  VOB initiated via [email or web address]  Next Prolia  inj DUE: 04/15/24

## 2024-03-26 ENCOUNTER — Other Ambulatory Visit: Payer: Self-pay | Admitting: Family

## 2024-03-26 DIAGNOSIS — F32A Depression, unspecified: Secondary | ICD-10-CM

## 2024-03-27 ENCOUNTER — Ambulatory Visit: Admitting: Internal Medicine

## 2024-03-27 ENCOUNTER — Encounter: Payer: Self-pay | Admitting: Internal Medicine

## 2024-03-27 VITALS — BP 124/82 | HR 103 | Ht 65.0 in | Wt 154.0 lb

## 2024-03-27 DIAGNOSIS — E559 Vitamin D deficiency, unspecified: Secondary | ICD-10-CM

## 2024-03-27 DIAGNOSIS — R748 Abnormal levels of other serum enzymes: Secondary | ICD-10-CM | POA: Diagnosis not present

## 2024-03-27 DIAGNOSIS — D352 Benign neoplasm of pituitary gland: Secondary | ICD-10-CM

## 2024-03-27 DIAGNOSIS — Z85850 Personal history of malignant neoplasm of thyroid: Secondary | ICD-10-CM | POA: Diagnosis not present

## 2024-03-27 DIAGNOSIS — M810 Age-related osteoporosis without current pathological fracture: Secondary | ICD-10-CM

## 2024-03-27 DIAGNOSIS — E890 Postprocedural hypothyroidism: Secondary | ICD-10-CM

## 2024-03-27 NOTE — Patient Instructions (Signed)

## 2024-03-27 NOTE — Progress Notes (Unsigned)
 Name: [name]  MRN/ DOB: [id number], 10-Jan-1946    Age/ Sex: 78 y.o., female    PCP: Jason Leita Repine, FNP   Reason for Endocrinology Evaluation: Pituitary macroadenoma      Date of Initial Endocrinology Evaluation: 12/12/2021    HPI: Ms. [name] is a 78 y.o. female with a past medical history of HTN, postoperative Hypothyroidism , dyslipidemia , Hx of left nephrectomy ( 9.8 cm benign cyst) .  The patient presented for initial endocrinology clinic visit on 12/12/2021 for consultative assistance with her Pituitary macroadenoma .     Pt has been diagnosed with pituitary macroadenoma 1.3 cm on MRI 03/2021 during evaluation of tinnitus and hearing loss.    She is S/P total  thyroidectomy in 2000 . Per pt small areas of cancer were found but no further treatment was needed   Has right kidney stone    On her initial visit to our clinic she had normal prolactin, IGF-I, ACTH , cortisol and TSH.  As well as appropriately elevated FSH  THYROID  HISTORY :  She is S/P total  thyroidectomy in 2000 . Per pt small areas of cancer were found but no further treatment was needed   Thyroid  bed ultrasound was ordered due to thyroglobulin levels increasing from 0.2 to 2.0  There was a small 1.1 cm focus on the left, this was thought to be too small by the radiologist to sample, close follow-up was recommended.      Elevated Alkaline Phosphatase: Patient has been noted with elevated alkaline phosphatase 05/2022 with a maximum serum level of 173 IU/mL in 09/2022  Alkaline phosphatase isoenzymes showed elevated bone fracture 72%, normal liver fraction 28% and 0% intestinal fraction    In reviewing her records, the patient did have a bone scan 05/2020 showing uptake adjacent to the femoral component of the left hip prosthesis in a pattern suspicious for aseptic loosening at the time  DXA scan showed osteopenia 05/2020 with the right femoral neck BMD -2.1  She is s/p left knee  surgery 2023 Right knee replacement 2022 S/P back and Left hip sx 2021   DXA scan showed osteoporosis with a T-score of -3.6% at the left distal radius  Started Prolia  with first dose on 10/16/2023  SUBJECTIVE:    Today (03/27/24):  [name] is here for follow-up on pituitary adenoma and elevated alkaline phosphatase   She has been following up with cardiology for coronary artery calcifications, mitral valve prolapse She was evaluated by general surgery for gallstones in the past   Denies any rash to Prolia  injection  She had a fall 2 weeks ago  Has frequency  Has joint aches and pain Has back pain since the fall   Weight remains stable Denies constipation nor diarrhea   Denies local neck swelling  No recent prednisone   No Biotin    Levothyroxine  88 mcg , 1.5 tabs on Sundays, 1 tablet Monday through Saturday  She is on MVI daily  Calcium  600/Vit D , 1 tablet  daily   HISTORY:  Past Medical History:  Past Medical History:  Diagnosis Date   Adenomatous colon polyp    Allergy Years ago   Anxiety Years ago   Arthritis    Cataract    Chronic kidney disease    Left kidney removed   Complication of anesthesia    Depression Years ago   Heart murmur    MVP   Hyperlipidemia    Hypertension  Hypothyroidism    PONV (postoperative nausea and vomiting)    Sleep apnea Last few years   Get up 2 times a night to go to bathroom   Thyroid  disease    Past Surgical History:  Past Surgical History:  Procedure Laterality Date   ABDOMINAL HYSTERECTOMY     APPENDECTOMY     BACK SURGERY     BREAST EXCISIONAL BIOPSY Right 1983   COLONOSCOPY W/ POLYPECTOMY  2003,2006,2013   [name]   DIAGNOSTIC LAPAROSCOPY  1979   exploratory to get pregnant   DILATION AND CURETTAGE OF UTERUS     X2   EYE SURGERY     JOINT REPLACEMENT     NEPHRECTOMY  2010   Left for 9.8cm benign complex cyst   SPINE SURGERY     THYROIDECTOMY  2000   Dr Queen   TONSILLECTOMY     TOTAL  ABDOMINAL HYSTERECTOMY W/ BILATERAL SALPINGOOPHORECTOMY  2003   [name] for dysfunctional menses   TOTAL HIP ARTHROPLASTY Left 06/24/2019   Procedure: TOTAL HIP ARTHROPLASTY ANTERIOR APPROACH;  Surgeon: Beverley Evalene BIRCH, MD;  Location: [facility];  Service: Orthopedics;  Laterality: Left;   TUBAL LIGATION      Social History:  reports that she quit smoking about 48 years ago. Her smoking use included cigarettes. She started smoking about 52 years ago. She has a 2 pack-year smoking history. She has never used smokeless tobacco. She reports that she does not currently use alcohol. She reports that she does not use drugs. Family History: family history includes ADD / ADHD in her mother; Coronary artery disease in her father; Heart disease in her mother; Hyperlipidemia in her mother; Nephrolithiasis in her father; Thyroid  disease in her mother; Transient ischemic attack in her father; Transient ischemic attack (age of onset: 75) in her mother; Urolithiasis in her sister.   HOME MEDICATIONS: Allergies as of 03/27/2024       Reactions   Atorvastatin     myalgias    Simvastatin    myalgias   Codeine Sulfate    made me jumpy   Erythromycin Nausea Only        Medication List        Accurate as of March 27, 2024 10:01 AM. If you have any questions, ask your nurse or doctor.          ALPRAZolam  0.25 MG tablet Commonly known as: XANAX  TAKE 2 TABLETS (0.5 MG TOTAL) BY MOUTH AT BEDTIME AS NEEDED FOR ANXIETY.   amLODipine  10 MG tablet Commonly known as: NORVASC  Take 1 tablet (10 mg total) by mouth daily.   benazepril  40 MG tablet Commonly known as: LOTENSIN  TAKE 1 TABLET BY MOUTH EVERY DAY   celecoxib  200 MG capsule Commonly known as: CELEBREX  TAKE 1 CAPSULE BY MOUTH EVERY DAY AS NEEDED   CENTRUM PO Take 1 tablet by mouth daily.   cyanocobalamin  1000 MCG tablet Commonly known as: VITAMIN B12 Take by mouth.   D3 ADULT PO Take by mouth.   escitalopram  10 MG tablet Commonly  known as: LEXAPRO  Take 1 tablet (10 mg total) by mouth daily.   ezetimibe  10 MG tablet Commonly known as: ZETIA  Take 1 tablet (10 mg total) by mouth daily.   levothyroxine  88 MCG tablet Commonly known as: SYNTHROID  Take 1 tablet (88 mcg total) by mouth as directed.   metoprolol  succinate 50 MG 24 hr tablet Commonly known as: TOPROL -XL TAKE 1 TABLET BY MOUTH EVERY DAY WITH OR IMMEDIATELY FOLLOWING A MEAL  Multi-Vitamin tablet Take 1 tablet by mouth daily.   NON FORMULARY Take 2,500 Units by mouth. daily   potassium citrate  10 MEQ (1080 MG) SR tablet Commonly known as: UROCIT-K  Take 10 mEq by mouth daily.   Repatha  SureClick 140 MG/ML Soaj Generic drug: Evolocumab  Inject 140 mg into the skin every 14 (fourteen) days.   TYLENOL  ARTHRITIS PAIN PO Take by mouth.          REVIEW OF SYSTEMS: A comprehensive ROS was conducted with the patient and is negative except as per HPI    OBJECTIVE:  VS: BP 124/82 (BP Location: Left Arm, Patient Position: Sitting, Cuff Size: Normal)   Pulse (!) 103   Ht 5' 5 (1.651 m)   Wt 154 lb (69.9 kg)   SpO2 99%   BMI 25.63 kg/m    Wt Readings from Last 3 Encounters:  03/27/24 154 lb (69.9 kg)  12/28/23 155 lb 3.2 oz (70.4 kg)  10/16/23 158 lb (71.7 kg)     EXAM: General: Pt appears well and is in NAD  Neck:  Thyroid : Thyroid  size normal.  No goiter or nodules appreciated.   Lungs: Clear with good BS bilat with no rales, rhonchi, or wheezes  Heart: Auscultation: RRR.  Extremities:  BL LE: No pretibial edema   Mental Status: Judgment, insight: Intact Orientation: Oriented to time, place, and person Memory: Intact for recent and remote events Mood and affect: No depression, anxiety, or agitation     DATA REVIEWED:     Latest Reference Range & Units 03/27/24 10:17  AG Ratio 1.0 - 2.5 (calc) 1.6  AST 10 - 35 U/L 22  ALT 6 - 29 U/L 17  Total Protein 6.1 - 8.1 g/dL 7.3  Bilirubin, Direct 0.0 - 0.2 mg/dL 0.1  Indirect  Bilirubin 0.2 - 1.2 mg/dL (calc) 0.4  Total Bilirubin 0.2 - 1.2 mg/dL 0.5  Alkaline phosphatase (APISO) 37 - 153 U/L 87  Vitamin D , 25-Hydroxy 30 - 100 ng/mL 39  Globulin 1.9 - 3.7 g/dL (calc) 2.8    Latest Reference Range & Units 03/27/24 10:17  Albumin MSPROF 3.6 - 5.1 g/dL 4.5     Latest Reference Range & Units 03/27/24 10:17  TSH 0.40 - 4.50 mIU/L 0.72      Latest Reference Range & Units 07/19/23 10:00  Cortisol, Plasma ug/dL 89.7  FSH mIU/ML 11.4  Prolactin ng/mL 6.8  Glucose 70 - 99 mg/dL 879 (H)  TSH 9.64 - 4.49 uIU/mL 6.03 (H)     Latest Reference Range & Units 04/27/23 13:56 05/21/23 10:40  Alkaline phosphatase (APISO) 37 - 153 U/L 146   BONE FRACTION 14 - 68 % 69 (H)   INTESTINAL FRAC. 0 - 18 % 0   LIVER FRACTION 18 - 85 % 31   VITD 30.00 - 100.00 ng/mL  33.47  Vitamin B12 200 - 1,100 pg/mL 605       Latest Reference Range & Units 05/21/23 10:40  PTH, Intact 16 - 77 pg/mL 45  TSH 0.35 - 5.50 uIU/mL 0.70   Thyroid  bed ultrasound 07/24/2023  FINDINGS: The thyroid  gland is surgically absent. No evidence of thyroid  tissue in the right thyroid  resection bed. Questionable focus of hypoechoic soft tissue in the left resection bed measuring 1.1 x 0.3 x 0.6 cm. No definite internal vascularity.   No suspicious lymphadenopathy.   IMPRESSION: 1. Surgical changes of total thyroidectomy. 2. Small focus of questionable tissue in the left resection bed measuring 1.1 x 0.3 x 0.6 cm.  This structure is very small and would be difficult to biopsy at its current size. Recommend follow-up imaging in 6 months to assess stability. If there is evidence of enlargement, biopsy may become warranted.   DXA 07/24/2023  Narrative & Impression  Date of study: 05/24/2023  Results:   Lumbar spine L1-L2 (L3, L4) Femoral neck (FN) 33% distal left radius Ultra distal left radius  T-score -0.1 RFN: -2.1 LFN: n/a -3.6 -2.3  Change in BMD from previous DXA test (%) +2.4% -0.7% n/a  n/a  (*) statistically significant   Assessment: Patient has OSTEOPOROSIS according to the [facility] classification for osteoporosis (see below).    MR brain 04/12/2021  Brain:   Cerebral volume is normal.   0.7 x 1.3 cm (AP x TV) precontrast T1 hyperintense and hypoenhancing lesion within the mid-to-posterior the pituitary gland (for instance as seen on series 2, image 12) (series 14, image 26).   Mild multifocal T2/FLAIR hyperintensity within the cerebral white matter and pons, nonspecific but compatible with chronic small vessel ischemic disease.   No cerebellopontine angle or internal auditory canal mass is demonstrated. Unremarkable appearance of the seventh and eighth cranial nerves bilaterally.   There is no acute infarct.   No extra-axial fluid collection.   No midline shift.   Vascular: Maintained flow voids within the proximal large arterial vessels.   Skull and upper cervical spine: No focal suspicious marrow lesion.   Sinuses/Orbits: Visualized orbits show no acute finding. No significant paranasal sinus disease.   Impression #2 will be called to the ordering clinician or representative by the Radiologist Assistant, and communication documented in the PACS or [employer].   IMPRESSION: No cerebellopontine angle or internal auditory canal mass.   1.3 x 0.7 cm precontrast T1 hyperintense and hypoenhancing lesion within the mid-to-posterior pituitary gland. This could reflect a pituitary adenoma containing non-acute hemorrhage or proteinaceous material, or a Rathke's cleft cyst. Correlate with relevant endocrinologic laboratory values. Additionally, consider a contrast-enhanced pituitary protocol brain MRI for further evaluation.   Mild chronic small vessel ischemic changes within the cerebral white matter and pons.      ASSESSMENT/PLAN/RECOMMENDATIONS:   Pituitary macroadenoma  -No local symptoms -No clinical evidence of hyper or  hypopituitarism -She had a pituitary MRI through Dr. Joshua office, per patient this is stable, these records are not available -She is up-to-date on visual field testing -Prolactin, cortisol,ACTH  and FSH are appropriate   2.  Postoperative hypothyroidism:  -Patient is clinically euthyroid -TSH normal  Medication levothyroxine  88 mcg , 1.5 tabs on Sundays, 1 tablet Monday through Saturday    3.  History of thyroid  cancer:   -She is status post total thyroidectomy in 2000, records not available and unknown type of thyroid  cancer -No history of radioactive iodine  ablation -No local symptoms - Due to thyroglobulin level trending up, thyroid  bed ultrasound was performed November, 2024 with a small 1.1 cm left thyroid  bed focus, was too small to biopsy, short-term follow-up was recommended by radiology department - I suspect the reason for elevated TG is due to change in lab companies and different reference    4.  Elevated Alkaline Phosphatase:   -Resolved with osteoporosis treatment -72% of bone origin, normal hepatic origin No intestinal origin  Medication Continue multivitamin daily OTC calcium /vitamin D  600 mg daily  5.  Osteoporosis  -DXA scan showed osteoporosis with a T-score of -3.6 at the left distal radius 06/2023 -Took first initial dose of Prolia  on 10/16/2023 -I have emphasized the importance  of optimizing calcium  and vitamin D  intake -Tolerating Prolia     Follow-up in 6 months  Signed electronically by: Stefano Redgie Butts, MD  [facility] Endocrinology  [facility] [address] [place], [place] [address] Phone: [phone] FAX: [phone]   CC: Jason Leita Repine, FNP [address] [place] [address] Phone: [phone] Fax: [phone]   Return to Endocrinology clinic as below: Future Appointments  Date Time Provider Department Center  04/09/2024 11:20 AM Swinyer, Rosaline HERO, NP [facility]  [facility]  04/18/2024  1:30 PM [facility] NURSE [facility] None

## 2024-03-28 ENCOUNTER — Ambulatory Visit: Payer: Self-pay | Admitting: Internal Medicine

## 2024-03-28 LAB — VITAMIN D 25 HYDROXY (VIT D DEFICIENCY, FRACTURES): Vit D, 25-Hydroxy: 39 ng/mL (ref 30–100)

## 2024-03-28 LAB — THYROGLOBULIN LEVEL: Thyroglobulin: 1.2 ng/mL — ABNORMAL LOW

## 2024-03-28 LAB — HEPATIC FUNCTION PANEL
AG Ratio: 1.6 (calc) (ref 1.0–2.5)
ALT: 17 U/L (ref 6–29)
AST: 22 U/L (ref 10–35)
Albumin: 4.5 g/dL (ref 3.6–5.1)
Alkaline phosphatase (APISO): 87 U/L (ref 37–153)
Bilirubin, Direct: 0.1 mg/dL (ref 0.0–0.2)
Globulin: 2.8 g/dL (ref 1.9–3.7)
Indirect Bilirubin: 0.4 mg/dL (ref 0.2–1.2)
Total Bilirubin: 0.5 mg/dL (ref 0.2–1.2)
Total Protein: 7.3 g/dL (ref 6.1–8.1)

## 2024-03-28 LAB — THYROGLOBULIN ANTIBODY: Thyroglobulin Ab: <1 [IU]/mL (ref <=1)

## 2024-03-28 LAB — TSH: TSH: 0.72 m[IU]/L (ref 0.40–4.50)

## 2024-03-29 NOTE — Telephone Encounter (Signed)
 [employer] - Prior Authorization REQUIRED for [employer]

## 2024-04-01 NOTE — Telephone Encounter (Signed)
 [employer]  Patient is ready for scheduling on or after 04/15/24  Out-of-pocket cost due at time of visit: $351.87   Primary: [employer] Medicare Advantage HMO POS Prolia  co-insurance: 20% (approximately $331.87) Admin fee co-insurance: $20  Deductible: does not apply  Prior Auth: APPROVED PA# [id number] Valid: 08/13/23-08/12/24  Secondary: N/A Prolia  co-insurance:  Admin fee co-insurance:  Deductible:  Prior Auth:  PA# Valid:   ** This summary of benefits is an estimation of the patient's out-of-pocket cost. Exact cost may vary based on individual plan coverage.

## 2024-04-01 NOTE — Telephone Encounter (Signed)
 Prior authorization on file and valid.  PA# [id number] Valid: 08/13/23-08/12/24

## 2024-04-02 DIAGNOSIS — Z961 Presence of intraocular lens: Secondary | ICD-10-CM | POA: Diagnosis not present

## 2024-04-02 DIAGNOSIS — H5213 Myopia, bilateral: Secondary | ICD-10-CM | POA: Diagnosis not present

## 2024-04-02 DIAGNOSIS — H53143 Visual discomfort, bilateral: Secondary | ICD-10-CM | POA: Diagnosis not present

## 2024-04-02 DIAGNOSIS — H26493 Other secondary cataract, bilateral: Secondary | ICD-10-CM | POA: Diagnosis not present

## 2024-04-09 ENCOUNTER — Ambulatory Visit (HOSPITAL_BASED_OUTPATIENT_CLINIC_OR_DEPARTMENT_OTHER): Admitting: Nurse Practitioner

## 2024-04-09 ENCOUNTER — Encounter (HOSPITAL_BASED_OUTPATIENT_CLINIC_OR_DEPARTMENT_OTHER): Payer: Self-pay | Admitting: Nurse Practitioner

## 2024-04-09 VITALS — BP 118/64 | HR 95 | Ht 65.0 in | Wt 155.0 lb

## 2024-04-09 DIAGNOSIS — R931 Abnormal findings on diagnostic imaging of heart and coronary circulation: Secondary | ICD-10-CM

## 2024-04-09 DIAGNOSIS — I10 Essential (primary) hypertension: Secondary | ICD-10-CM

## 2024-04-09 DIAGNOSIS — E781 Pure hyperglyceridemia: Secondary | ICD-10-CM | POA: Diagnosis not present

## 2024-04-09 DIAGNOSIS — Z7189 Other specified counseling: Secondary | ICD-10-CM

## 2024-04-09 DIAGNOSIS — F418 Other specified anxiety disorders: Secondary | ICD-10-CM | POA: Diagnosis not present

## 2024-04-09 DIAGNOSIS — E785 Hyperlipidemia, unspecified: Secondary | ICD-10-CM | POA: Diagnosis not present

## 2024-04-09 NOTE — Progress Notes (Signed)
 Cardiology Office Note   Date:  04/09/2024  ID:  [name], [name] 21-Dec-1945, MRN [id number] PCP: Jaclyn Leita Repine, FNP  [facility] Providers Cardiologist:  Jaclyn JAYSON Maxcy, MD     PMH Hypertension Hyperlipidemia Hypothyroidism Aortic atherosclerosis Mitral valve prolapse Coronary artery calcification Statin intolerance Elevated LP(a)  Initially referred to Dr. Maxcy for management of dyslipidemia.  Coronary calcium  score in 2021 was 61 (57th percentile) and revealed aortic atherosclerosis.  She denied symptoms concerning for angina.  Lexiscan  Myoview  in the setting of preoperative cardiac evaluation was low risk in 2022.  She was approved for Repatha .  Seen virtually 02/09/2022 for preoperative cardiac evaluation and was doing well.  Seen by Jaclyn Braver, NP 05/04/2023.  She had not started Repatha  for fear of going into the donut hole.  She was taking Zetia , as well as metoprolol  and amlodipine  and BP was well-controlled.  LDL was unable to be calculated on her most recent lipid profile due to triglycerides 717.  She was agreeable to start Repatha  140 mg every 14 days.  Last clinic visit was 09/06/2023 with Dr. Maxcy.  Most recent lipid panel revealed total cholesterol 348, triglycerides 476, HDL 39, and LDL 207.  She was on Vascepa  but was concerned about the cost of the medication.  She was also taking ezetimibe  10 mg daily.  She was agreeable to start Repatha .  Lipid panel 12/19/2023 with LDL particle #729, LDL-C 67, HDL-C 43, triglycerides 325, total cholesterol 161, and small LDL-P 593.   History of Present Illness Discussed the use of AI scribe software for clinical note transcription with the patient, who gave verbal consent to proceed.  History of Present Illness Jaclyn FLAMM is a  very pleasant 78 year old female who presents for follow-up of dyslipidemia. She reports generalized soreness following a recent mechanical fall.  She turned the wrong way in the bathroom  and subsequently fell over into her tub.  No loss of consciousness or head injury.  She currently has significant life stress with family members, but has been overall feeling well. She experiences situational anxiety and uses Xanax  (0.25 mg) as needed, primarily at night. Her blood pressure is well-controlled at home, averaging 124/82 mmHg.  She denies chest pain, shortness of breath, orthopnea, PND, edema, presyncope, syncope.  No dizziness or lightheadedness occurred prior to the fall. She has a history of knee and hip replacements and admits that her legs feel weak.  She admits she probably does not eat often enough, often skipping meals.  She is contemplating protein drinks supplements.  She is not having any concerning side effects from any of her medications.  She does not exercise on a consistent basis.  ROS: See HPI  Studies Reviewed EKG Interpretation Date/Time:  Wednesday April 09 2024 11:18:16 EDT Ventricular Rate:  95 PR Interval:  144 QRS Duration:  72 QT Interval:  352 QTC Calculation: 442 R Axis:   -21  Text Interpretation: Normal sinus rhythm Anterolateral infarct , age undetermined Interpretation limited secondary to artifact Nonspecific T wave abnormality When compared with ECG of 04-May-2023 10:58, Confirmed by [name] [phone]) on 04/09/2024 11:27:03 AM     Lipoprotein (a)  Date/Time Value Ref Range Status  12/19/2023 12:32 PM 124.0 (H) <75.0 nmol/L Final    Comment:    Note:  Values greater than or equal to 75.0 nmol/L may        indicate an independent risk factor for CHD,  but must be evaluated with caution when applied        to non-Caucasian populations due to the        influence of genetic factors on Lp(a) across        ethnicities.     Risk Assessment/Calculations           Physical Exam VS:  BP 118/64   Pulse 95   Ht 5' 5 (1.651 m)   Wt 155 lb (70.3 kg)   SpO2 96%   BMI 25.79 kg/m    Wt Readings from Last 3 Encounters:  04/09/24  155 lb (70.3 kg)  03/27/24 154 lb (69.9 kg)  12/28/23 155 lb 3.2 oz (70.4 kg)    GEN: Well nourished, well developed in no acute distress NECK: No JVD; No carotid bruits CARDIAC: RRR, no murmurs, rubs, gallops RESPIRATORY:  Clear to auscultation without rales, wheezing or rhonchi  ABDOMEN: Soft, non-tender, non-distended EXTREMITIES:  No edema; No deformity   Assessment & Plan Dyslipidemia LDL goal < 70 Hypertriglyceridemia Coronary artery calcification Cardiac risk CAC score of 61 (57th percentile) in March 2021.  Lipid panel completed 12/19/2023 with LDL particle #729, LDL-C 67, HDL-C 43, triglycerides 325, total cholesterol 161, small LDL-P 593.  LP(a) elevated at 124. Triglycerides remain elevated but have improved. She is tolerating Repatha  and ezetimibe  without concerning side effects.  Lipids are well-controlled.  EKG today reveals NSR with anterior lateral infarct, age undetermined, nonspecific T wave abnormality, however interpretation is limited by artifact. She denies chest pain, dyspnea, or other symptoms concerning for angina.  No indication for further ischemic evaluation at this time. We discussed ways for her to increase physical activity and heart healthy diet. - Continue Repatha  140 mg every 14 days - Continue ezetimibe  10 mg daily - Aim for at least 150 minutes of moderate intensity exercise each week along with weight lifting and resistance training 2 to 3 days/week -Eat a heart healthy, primarily  whole food diet limiting saturated fat, sugar, simple carbohydrates, and processed food - Recommend walking after meals to potentially lower blood sugar and triglycerides  Hypertension   BP initially elevated but improved on my recheck.  She reports home BP has been well-controlled despite in light of life stress currently with family members.  No change in antihypertensive therapy today. - Continue routine home BP monitoring  -Continue benazepril  and amlodipine  - Management  per PCP  Situational anxiety and sleep disturbance   She reports significant anxiety currently and difficulty sleeping. She uses Xanax  as needed, primarily to help her sleep. She did not tolerate hydroxyzine  or Lexapro . Recommendation to consider Zoloft  or buspirone as alternative treatments. Use Xanax  for situational anxiety as needed only, not on a regular schedule. -Management per PCP         Dispo: 1 year with Dr. Mona or me  Signed, Rosaline Bane, NP-C

## 2024-04-09 NOTE — Patient Instructions (Signed)
 Medication Instructions:   Your physician recommends that you continue on your current medications as directed. Please refer to the Current Medication list given to you today.  PLEASE ask your PCP ( primary care doctor) about Zoloft  or Buspar.   *If you need a refill on your cardiac medications before your next appointment, please call your pharmacy*  Lab Work:  Please get a fasting lipid panel with your Primary Care Doctor.   If you have labs (blood work) drawn today and your tests are completely normal, you will receive your results only by: MyChart Message (if you have MyChart) OR A paper copy in the mail If you have any lab test that is abnormal or we need to change your treatment, we will call you to review the results.  Testing/Procedures:  None ordered.  Follow-Up: At [facility], you and your health needs are our priority.  As part of our continuing mission to provide you with exceptional heart care, our providers are all part of one team.  This team includes your primary Cardiologist (physician) and Advanced Practice Providers or APPs (Physician Assistants and Nurse Practitioners) who all work together to provide you with the care you need, when you need it.  Your next appointment:   1 year(s)  Provider:   K. Italy Hilty, MD or Rosaline Bane, NP    We recommend signing up for the patient portal called MyChart.  Sign up information is provided on this After Visit Summary.  MyChart is used to connect with patients for Virtual Visits (Telemedicine).  Patients are able to view lab/test results, encounter notes, upcoming appointments, etc.  Non-urgent messages can be sent to your provider as well.   To learn more about what you can do with MyChart, go to [email or web address].   Other Instructions  Your physician wants you to follow-up in: 1 year.  You will receive a reminder letter in the mail two months in advance. If you don't receive a letter, please call  our office to schedule the follow-up appointment.

## 2024-04-15 ENCOUNTER — Ambulatory Visit: Payer: Medicare Other

## 2024-04-18 ENCOUNTER — Ambulatory Visit

## 2024-04-18 VITALS — BP 110/60 | HR 83 | Resp 20 | Ht 65.0 in | Wt 155.2 lb

## 2024-04-18 DIAGNOSIS — M810 Age-related osteoporosis without current pathological fracture: Secondary | ICD-10-CM | POA: Diagnosis not present

## 2024-04-18 MED ORDER — DENOSUMAB 60 MG/ML ~~LOC~~ SOSY: 60.0000 mg | PREFILLED_SYRINGE | Freq: Once | SUBCUTANEOUS | Status: AC

## 2024-04-18 MED ORDER — DENOSUMAB 60 MG/ML ~~LOC~~ SOSY
60.0000 mg | PREFILLED_SYRINGE | Freq: Once | SUBCUTANEOUS | Status: AC
Start: 2024-04-18 — End: 2024-04-18
  Administered 2024-04-18: 60 mg via SUBCUTANEOUS

## 2024-04-18 NOTE — Progress Notes (Signed)
 After obtaining consent, and per orders of Dr. Sam, injection of Prolia given by Dietrich LOISE Lax. Patient instructed to remain in clinic for 20 minutes afterwards, and to report any adverse reaction to me immediately.

## 2024-04-21 DIAGNOSIS — M7061 Trochanteric bursitis, right hip: Secondary | ICD-10-CM | POA: Diagnosis not present

## 2024-04-25 ENCOUNTER — Other Ambulatory Visit: Payer: Self-pay | Admitting: Family

## 2024-04-25 DIAGNOSIS — F419 Anxiety disorder, unspecified: Secondary | ICD-10-CM

## 2024-04-28 NOTE — Telephone Encounter (Signed)
 Last Prolia  inj 04/18/24 Next Prolia  inj due 10/20/24

## 2024-04-29 DIAGNOSIS — M7061 Trochanteric bursitis, right hip: Secondary | ICD-10-CM | POA: Diagnosis not present

## 2024-05-02 ENCOUNTER — Ambulatory Visit (INDEPENDENT_AMBULATORY_CARE_PROVIDER_SITE_OTHER): Admitting: Family

## 2024-05-02 ENCOUNTER — Encounter: Payer: Self-pay | Admitting: Family

## 2024-05-02 ENCOUNTER — Other Ambulatory Visit: Payer: Self-pay | Admitting: Family

## 2024-05-02 VITALS — BP 124/78 | HR 99 | Ht 65.0 in | Wt 155.4 lb

## 2024-05-02 DIAGNOSIS — M810 Age-related osteoporosis without current pathological fracture: Secondary | ICD-10-CM

## 2024-05-02 DIAGNOSIS — M1990 Unspecified osteoarthritis, unspecified site: Secondary | ICD-10-CM

## 2024-05-02 DIAGNOSIS — I10 Essential (primary) hypertension: Secondary | ICD-10-CM

## 2024-05-02 DIAGNOSIS — E785 Hyperlipidemia, unspecified: Secondary | ICD-10-CM

## 2024-05-02 DIAGNOSIS — E782 Mixed hyperlipidemia: Secondary | ICD-10-CM

## 2024-05-02 MED ORDER — EZETIMIBE 10 MG PO TABS: 10.0000 mg | ORAL_TABLET | Freq: Every day | ORAL | 1 refills | Status: AC

## 2024-05-02 MED ORDER — MELOXICAM 15 MG PO TABS: 15.0000 mg | ORAL_TABLET | Freq: Every day | ORAL | 0 refills | Status: AC

## 2024-05-02 MED ORDER — BENAZEPRIL HCL 40 MG PO TABS: 40.0000 mg | ORAL_TABLET | Freq: Every day | ORAL | 1 refills | Status: AC

## 2024-05-02 MED ORDER — AMLODIPINE BESYLATE 10 MG PO TABS: 10.0000 mg | ORAL_TABLET | Freq: Every day | ORAL | 1 refills | Status: AC

## 2024-05-02 NOTE — Progress Notes (Signed)
 [name] is a 78 y.o. female with the following history as recorded in [employer]:  Patient Active Problem List   Diagnosis Date Noted   Age-related osteoporosis without current pathological fracture 07/30/2023   Elevated alkaline phosphatase level 07/19/2023   Osteopenia 07/19/2023   Postoperative hypothyroidism 12/12/2021   Pituitary macroadenoma (HCC) 12/12/2021   Fatigue 12/12/2021   Eustachian tube dysfunction 01/12/2021   Left Achilles tendinitis 08/25/2020   Chronic hip pain after total replacement of left hip joint 06/15/2020   Primary localized osteoarthritis of hip 06/24/2019   Primary osteoarthritis of left hip 06/02/2019   S/P lumbar fusion 04/23/2019   Lumbar spinal stenosis 07/16/2018   Renal cyst 07/16/2018   Left lumbar radiculopathy 06/24/2018   Left hip pain 10/25/2017   Greater trochanteric bursitis of left hip 10/03/2017   Anxiety and depression 05/23/2017   Hyperlipidemia 04/21/2016   Routine general medical examination at a health care facility 01/05/2016   Medicare annual wellness visit, subsequent 01/05/2016   Allergic rhinitis 12/29/2015   Vaginal atrophy 10/22/2013   DJD (degenerative joint disease) of knee 10/18/2012   NEPHRECTOMY, HX OF 05/28/2009   Hypothyroidism, postsurgical 07/18/2007   HYPERLIPIDEMIA 07/18/2007   Essential hypertension 07/18/2007   OSTEOPENIA 07/15/2007   History of colonic polyps 07/15/2007    Current Outpatient Medications  Medication Sig Dispense Refill   Acetaminophen  (TYLENOL  ARTHRITIS PAIN PO) Take by mouth.     ALPRAZolam  (XANAX ) 0.25 MG tablet TAKE 2 TABLETS (0.5 MG TOTAL) BY MOUTH AT BEDTIME AS NEEDED FOR ANXIETY. 60 tablet 0   Cholecalciferol (D3 ADULT PO) Take by mouth.     cyanocobalamin  (VITAMIN B12) 1000 MCG tablet Take by mouth.     Evolocumab  (REPATHA  SURECLICK) 140 MG/ML SOAJ Inject 140 mg into the skin every 14 (fourteen) days. 2 mL 11   levothyroxine  (SYNTHROID ) 88 MCG tablet Take 1 tablet (88 mcg  total) by mouth as directed. 98 tablet 3   metoprolol  succinate (TOPROL -XL) 50 MG 24 hr tablet TAKE 1 TABLET BY MOUTH EVERY DAY WITH OR IMMEDIATELY FOLLOWING A MEAL 90 tablet 3   Multiple Vitamin (MULTI-VITAMIN) tablet Take 1 tablet by mouth daily.     Multiple Vitamins-Minerals (CENTRUM PO) Take 1 tablet by mouth daily.     NON FORMULARY Take 2,500 Units by mouth. daily     potassium citrate  (UROCIT-K ) 10 MEQ (1080 MG) SR tablet Take 10 mEq by mouth daily.     amLODipine  (NORVASC ) 10 MG tablet Take 1 tablet (10 mg total) by mouth daily. 90 tablet 1   benazepril  (LOTENSIN ) 40 MG tablet Take 1 tablet (40 mg total) by mouth daily. 90 tablet 1   ezetimibe  (ZETIA ) 10 MG tablet Take 1 tablet (10 mg total) by mouth daily. 90 tablet 1   meloxicam  (MOBIC ) 15 MG tablet Take 1 tablet (15 mg total) by mouth daily. 30 tablet 0   Current Facility-Administered Medications  Medication Dose Route Frequency Provider Last Rate Last Admin   denosumab  (PROLIA ) injection 60 mg  60 mg Subcutaneous Q6 months Shamleffer, Ibtehal Jaralla, MD       [START ON 10/19/2024] denosumab  (PROLIA ) injection 60 mg  60 mg Subcutaneous Once Shamleffer, Ibtehal Jaralla, MD        Allergies: Atorvastatin , Simvastatin, Codeine sulfate, and Erythromycin  Past Medical History:  Diagnosis Date   Adenomatous colon polyp    Allergy Years ago   Anxiety Years ago   Arthritis    Cataract    Chronic kidney disease  Left kidney removed   Complication of anesthesia    Depression Years ago   Heart murmur    MVP   Hyperlipidemia    Hypertension    Hypothyroidism    PONV (postoperative nausea and vomiting)    Sleep apnea Last few years   Get up 2 times a night to go to bathroom   Thyroid  disease     Past Surgical History:  Procedure Laterality Date   ABDOMINAL HYSTERECTOMY     APPENDECTOMY     BACK SURGERY     BREAST EXCISIONAL BIOPSY Right 1983   COLONOSCOPY W/ POLYPECTOMY  2003,2006,2013   [name]   DIAGNOSTIC  LAPAROSCOPY  1979   exploratory to get pregnant   DILATION AND CURETTAGE OF UTERUS     X2   EYE SURGERY     JOINT REPLACEMENT     NEPHRECTOMY  2010   Left for 9.8cm benign complex cyst   SPINE SURGERY     THYROIDECTOMY  2000   Dr Queen   TONSILLECTOMY     TOTAL ABDOMINAL HYSTERECTOMY W/ BILATERAL SALPINGOOPHORECTOMY  2003   [name] for dysfunctional menses   TOTAL HIP ARTHROPLASTY Left 06/24/2019   Procedure: TOTAL HIP ARTHROPLASTY ANTERIOR APPROACH;  Surgeon: Beverley Evalene BIRCH, MD;  Location: [facility];  Service: Orthopedics;  Laterality: Left;   TUBAL LIGATION      Family History  Problem Relation Age of Onset   Hyperlipidemia Mother    Transient ischemic attack Mother 24   Thyroid  disease Mother        hyperthyroid   ADD / ADHD Mother    Heart disease Mother    Transient ischemic attack Father        in 23s   Coronary artery disease Father        stents @ 68   Nephrolithiasis Father    Urolithiasis Sister    Diabetes Neg Hx    Colon cancer Neg Hx    Rectal cancer Neg Hx    Stomach cancer Neg Hx    Esophageal cancer Neg Hx     Social History   Tobacco Use   Smoking status: Former    Current packs/day: 0.00    Average packs/day: 0.5 packs/day for 4.0 years (2.0 ttl pk-yrs)    Types: Cigarettes    Start date: 08/29/1971    Quit date: 08/29/1975    Years since quitting: 48.7   Smokeless tobacco: Never   Tobacco comments:    smoked 1973-1977, up to 1/2 ppd  Substance Use Topics   Alcohol use: Not Currently    Subjective:   Patient presents to follow up on chronic care needs- has recently started Prolia ; working with orthopedist for chronic hip pain- now in PT; notes that she felt so good on recent treatment with prednisone - wonders about options for arthritis management;     Objective:  Vitals:   05/02/24 1120  BP: 124/78  Pulse: 99  SpO2: 94%  Weight: 155 lb 6.4 oz (70.5 kg)  Height: 5' 5 (1.651 m)    General: Well developed, well nourished, in no  acute distress  Skin : Warm and dry.  Head: Normocephalic and atraumatic  Lungs: Respirations unlabored; clear to auscultation bilaterally without wheeze, rales, rhonchi  CVS exam: normal rate and regular rhythm.  Musculoskeletal: No deformities; no active joint inflammation  Extremities: No edema, cyanosis, clubbing  Vessels: Symmetric bilaterally  Neurologic: Alert and oriented; speech intact; face symmetrical; moves all extremities well; CNII-XII intact without  focal deficit   Assessment:  1. Essential hypertension   2. Age-related osteoporosis without current pathological fracture   3. HYPERLIPIDEMIA   4. Osteoarthritis, unspecified osteoarthritis type, unspecified site     Plan:  Stable; refills updated; Continue with endocrine as scheduled; Continue with lipid specialist; Trial of Mobic  15 mg daily; will refer to rheumatology to discuss treatment options;   Return in about 6 months (around 10/30/2024) for TOC with Dr. Antonio.  No orders of the defined types were placed in this encounter.   Requested Prescriptions    No prescriptions requested or ordered in this encounter

## 2024-05-06 DIAGNOSIS — M7061 Trochanteric bursitis, right hip: Secondary | ICD-10-CM | POA: Diagnosis not present

## 2024-05-13 DIAGNOSIS — M7061 Trochanteric bursitis, right hip: Secondary | ICD-10-CM | POA: Diagnosis not present

## 2024-05-20 DIAGNOSIS — M7061 Trochanteric bursitis, right hip: Secondary | ICD-10-CM | POA: Diagnosis not present

## 2024-05-28 ENCOUNTER — Other Ambulatory Visit: Payer: Self-pay

## 2024-05-28 DIAGNOSIS — F32A Depression, unspecified: Secondary | ICD-10-CM

## 2024-05-28 MED ORDER — ALPRAZOLAM 0.25 MG PO TABS: 0.5000 mg | ORAL_TABLET | Freq: Every evening | ORAL | 0 refills | Status: DC | PRN

## 2024-05-28 NOTE — Telephone Encounter (Signed)
 Requesting: alprazolam  0.25mg   Contract: None LID:[id number] Last Visit: 05/02/24 Next Visit:  10/30/24 w/ new PCP  Last Refill: 04/29/24 #60 and 0RF   Please Advise

## 2024-06-10 NOTE — Progress Notes (Deleted)
 Office Visit Note  Patient: [name]             Date of Birth: 1946/01/11           MRN: [id number]             PCP: Jason Leita Repine, FNP (Inactive) Referring: Jason Leita Repine,* Visit Date: 06/24/2024 Occupation: SALES  Subjective:  No chief complaint on file.   History of Present Illness: [name] is a 78 y.o. female ***     Component     Latest Ref Rng 03/07/2019  Iron     42 - 145 ug/dL 858   Transferrin     787.9 - 360.0 mg/dL 745.9   Saturation Ratios     20.0 - 50.0 % 39.7   Vitamin D  1, 25 (OH) Total     18 - 72 pg/mL 54   Vitamin D3 1, 25 (OH)     pg/mL 54   Vitamin D2 1, 25 (OH)     pg/mL <8   Angiotensin-Converting Enzyme     9 - 67 U/L <5 (L)   RA Latex Turbid.     <14 IU/mL 16 (H)   Anti Nuclear Antibody (ANA)     NEGATIVE  NEGATIVE   Ferritin     10.0 - 291.0 ng/mL 51.4   CRP     0.5 - 20.0 mg/dL <8.9   Sed Rate     0 - 30 mm/hr 27     Legend: (L) Low (H) High  Activities of Daily Living:  Patient reports morning stiffness for *** {minute/hour:19697}.   Patient {ACTIONS;DENIES/REPORTS:21021675::Denies} nocturnal pain.  Difficulty dressing/grooming: {ACTIONS;DENIES/REPORTS:21021675::Denies} Difficulty climbing stairs: {ACTIONS;DENIES/REPORTS:21021675::Denies} Difficulty getting out of chair: {ACTIONS;DENIES/REPORTS:21021675::Denies} Difficulty using hands for taps, buttons, cutlery, and/or writing: {ACTIONS;DENIES/REPORTS:21021675::Denies}  No Rheumatology ROS completed.   PMFS History:  Patient Active Problem List   Diagnosis Date Noted   Age-related osteoporosis without current pathological fracture 07/30/2023   Elevated alkaline phosphatase level 07/19/2023   Osteopenia 07/19/2023   Postoperative hypothyroidism 12/12/2021   Pituitary macroadenoma (HCC) 12/12/2021   Fatigue 12/12/2021   Eustachian tube dysfunction 01/12/2021   Left Achilles tendinitis 08/25/2020   Chronic hip pain after total  replacement of left hip joint 06/15/2020   Primary localized osteoarthritis of hip 06/24/2019   Primary osteoarthritis of left hip 06/02/2019   S/P lumbar fusion 04/23/2019   Lumbar spinal stenosis 07/16/2018   Renal cyst 07/16/2018   Left lumbar radiculopathy 06/24/2018   Left hip pain 10/25/2017   Greater trochanteric bursitis of left hip 10/03/2017   Anxiety and depression 05/23/2017   Hyperlipidemia 04/21/2016   Routine general medical examination at a health care facility 01/05/2016   Medicare annual wellness visit, subsequent 01/05/2016   Allergic rhinitis 12/29/2015   Vaginal atrophy 10/22/2013   DJD (degenerative joint disease) of knee 10/18/2012   NEPHRECTOMY, HX OF 05/28/2009   Hypothyroidism, postsurgical 07/18/2007   HYPERLIPIDEMIA 07/18/2007   Essential hypertension 07/18/2007   OSTEOPENIA 07/15/2007   History of colonic polyps 07/15/2007    Past Medical History:  Diagnosis Date   Adenomatous colon polyp    Allergy Years ago   Anxiety Years ago   Arthritis    Cataract    Chronic kidney disease    Left kidney removed   Complication of anesthesia    Depression Years ago   Heart murmur    MVP   Hyperlipidemia    Hypertension    Hypothyroidism    PONV (  postoperative nausea and vomiting)    Sleep apnea Last few years   Get up 2 times a night to go to bathroom   Thyroid  disease     Family History  Problem Relation Age of Onset   Hyperlipidemia Mother    Transient ischemic attack Mother 61   Thyroid  disease Mother        hyperthyroid   ADD / ADHD Mother    Heart disease Mother    Transient ischemic attack Father        in 20s   Coronary artery disease Father        stents @ 2   Nephrolithiasis Father    Urolithiasis Sister    Diabetes Neg Hx    Colon cancer Neg Hx    Rectal cancer Neg Hx    Stomach cancer Neg Hx    Esophageal cancer Neg Hx    Past Surgical History:  Procedure Laterality Date   ABDOMINAL HYSTERECTOMY     APPENDECTOMY     BACK  SURGERY     BREAST EXCISIONAL BIOPSY Right 1983   COLONOSCOPY W/ POLYPECTOMY  2003,2006,2013   [name]   DIAGNOSTIC LAPAROSCOPY  1979   exploratory to get pregnant   DILATION AND CURETTAGE OF UTERUS     X2   EYE SURGERY     JOINT REPLACEMENT     NEPHRECTOMY  2010   Left for 9.8cm benign complex cyst   SPINE SURGERY     THYROIDECTOMY  2000   Dr Queen   TONSILLECTOMY     TOTAL ABDOMINAL HYSTERECTOMY W/ BILATERAL SALPINGOOPHORECTOMY  2003   [name] for dysfunctional menses   TOTAL HIP ARTHROPLASTY Left 06/24/2019   Procedure: TOTAL HIP ARTHROPLASTY ANTERIOR APPROACH;  Surgeon: Beverley Evalene BIRCH, MD;  Location: [facility];  Service: Orthopedics;  Laterality: Left;   TUBAL LIGATION     Social History   Tobacco Use   Smoking status: Former    Current packs/day: 0.00    Average packs/day: 0.5 packs/day for 4.0 years (2.0 ttl pk-yrs)    Types: Cigarettes    Start date: 08/29/1971    Quit date: 08/29/1975    Years since quitting: 48.8   Smokeless tobacco: Never   Tobacco comments:    smoked 1973-1977, up to 1/2 ppd  Vaping Use   Vaping status: Never Used  Substance Use Topics   Alcohol use: Not Currently   Drug use: No   Social History   Social History Narrative   Fun/Hobbies: Grandkids     Immunization History  Administered Date(s) Administered   Fluad Quad(high Dose 65+) 05/24/2020, 06/15/2022   INFLUENZA, HIGH DOSE SEASONAL PF 06/18/2013, 04/21/2016, 05/23/2017, 05/17/2018, 05/27/2019   Influenza Split 06/21/2011, 06/12/2012   Influenza Whole 07/11/2007, 06/10/2008, 06/29/2010   Influenza,inj,Quad PF,6+ Mos 06/10/2014, 05/07/2015   Influenza-Unspecified 05/29/2019   Moderna Covid-19 Fall Seasonal Vaccine 77yrs & older 09/12/2022   Moderna Sars-Covid-2 Vaccination 09/20/2019, 10/18/2019, 06/21/2020   PNEUMOCOCCAL CONJUGATE-20 07/01/2023   Pneumococcal Conjugate-13 11/17/2015   Pneumococcal Polysaccharide-23 09/17/2013   Respiratory Syncytial Virus Vaccine,Recomb  Aduvanted(Arexvy) 09/12/2022   Td 01/05/2003   Tdap 02/05/2013   Zoster Recombinant(Shingrix) 10/27/2023   Zoster, Live 11/10/2011     Objective: Vital Signs: There were no vitals taken for this visit.   Physical Exam   Musculoskeletal Exam: ***  CDAI Exam: CDAI Score: -- Patient Global: --; Provider Global: -- Swollen: --; Tender: -- Joint Exam 06/24/2024   No joint exam has been documented for this visit  There is currently no information documented on the homunculus. Go to the Rheumatology activity and complete the homunculus joint exam.  Investigation: No additional findings.  Imaging: No results found.  Recent Labs: Lab Results  Component Value Date   WBC 9.1 04/27/2023   HGB 15.3 04/27/2023   PLT 329 04/27/2023   NA 140 07/19/2023   K 3.9 07/19/2023   CL 103 07/19/2023   CO2 27 07/19/2023   GLUCOSE 120 (H) 07/19/2023   BUN 19 07/19/2023   CREATININE 0.93 07/19/2023   BILITOT 0.5 03/27/2024   ALKPHOS 163 (H) 08/13/2023   AST 22 03/27/2024   ALT 17 03/27/2024   PROT 7.3 03/27/2024   ALBUMIN 4.7 08/13/2023   CALCIUM  9.8 07/19/2023   GFRAA >60 06/16/2019    Speciality Comments: No specialty comments available.  Procedures:  No procedures performed Allergies: Atorvastatin , Simvastatin, Codeine sulfate, and Erythromycin   Assessment / Plan:     Visit Diagnoses: No diagnosis found.  Orders: No orders of the defined types were placed in this encounter.  No orders of the defined types were placed in this encounter.   Face-to-face time spent with patient was *** minutes. Greater than 50% of time was spent in counseling and coordination of care.  Follow-Up Instructions: No follow-ups on file.   Waddell CHRISTELLA Craze, PA-C  Note - This record has been created using Dragon software.  Chart creation errors have been sought, but may not always  have been located. Such creation errors do not reflect on  the standard of medical care.

## 2024-06-16 DIAGNOSIS — M7061 Trochanteric bursitis, right hip: Secondary | ICD-10-CM | POA: Diagnosis not present

## 2024-06-24 ENCOUNTER — Encounter: Admitting: Physician Assistant

## 2024-06-24 DIAGNOSIS — E890 Postprocedural hypothyroidism: Secondary | ICD-10-CM

## 2024-06-24 DIAGNOSIS — M810 Age-related osteoporosis without current pathological fracture: Secondary | ICD-10-CM

## 2024-06-24 DIAGNOSIS — Z8639 Personal history of other endocrine, nutritional and metabolic disease: Secondary | ICD-10-CM

## 2024-06-24 DIAGNOSIS — Z905 Acquired absence of kidney: Secondary | ICD-10-CM

## 2024-06-24 DIAGNOSIS — M7662 Achilles tendinitis, left leg: Secondary | ICD-10-CM

## 2024-06-24 DIAGNOSIS — M5416 Radiculopathy, lumbar region: Secondary | ICD-10-CM

## 2024-06-24 DIAGNOSIS — R748 Abnormal levels of other serum enzymes: Secondary | ICD-10-CM

## 2024-06-24 DIAGNOSIS — M2550 Pain in unspecified joint: Secondary | ICD-10-CM

## 2024-06-24 DIAGNOSIS — F419 Anxiety disorder, unspecified: Secondary | ICD-10-CM

## 2024-06-24 DIAGNOSIS — I10 Essential (primary) hypertension: Secondary | ICD-10-CM

## 2024-06-24 DIAGNOSIS — D352 Benign neoplasm of pituitary gland: Secondary | ICD-10-CM

## 2024-06-24 DIAGNOSIS — Z981 Arthrodesis status: Secondary | ICD-10-CM

## 2024-06-24 DIAGNOSIS — Z860100 Personal history of colon polyps, unspecified: Secondary | ICD-10-CM

## 2024-06-24 DIAGNOSIS — G8929 Other chronic pain: Secondary | ICD-10-CM

## 2024-06-28 ENCOUNTER — Other Ambulatory Visit: Payer: Self-pay | Admitting: Family

## 2024-06-28 DIAGNOSIS — F419 Anxiety disorder, unspecified: Secondary | ICD-10-CM

## 2024-07-02 ENCOUNTER — Telehealth: Payer: Self-pay

## 2024-07-02 NOTE — Telephone Encounter (Signed)
 Copied from CRM [id number]. Topic: Clinical - Prescription Issue >> Jul 02, 2024  9:34 AM Tinnie BROCKS wrote: Reason for CRM: Patient got a notice from her pharmacy that she would have to see her new dr, Dr. Antonio Meth, for an appointment before they could refill her xanax . Pt is scheduled for next available with her for March. Does pt need sooner appt with another provider or does she need to be fit in to get her xanax  in a timely manner, or is the pharmacy incorrect? Please reach out to pt at [phone]

## 2024-07-03 ENCOUNTER — Other Ambulatory Visit: Payer: Self-pay | Admitting: Family Medicine

## 2024-07-03 ENCOUNTER — Encounter: Payer: Self-pay | Admitting: Family Medicine

## 2024-07-03 DIAGNOSIS — F32A Depression, unspecified: Secondary | ICD-10-CM

## 2024-07-03 MED ORDER — ALPRAZOLAM 0.25 MG PO TABS: 0.5000 mg | ORAL_TABLET | Freq: Every evening | ORAL | 0 refills | Status: DC | PRN

## 2024-07-04 ENCOUNTER — Telehealth: Payer: Self-pay | Admitting: Family

## 2024-07-04 NOTE — Telephone Encounter (Signed)
 Copied from CRM [id number]. Topic: Medicare AWV >> Jul 04, 2024  9:56 AM Nathanel DEL wrote: Reason for CRM: Called LVM 07/04/2024 to schedule AWV. Please schedule office or virtual visits  Nathanel Paschal; Care Guide Ambulatory Clinical Support [place] [facility] Direct Dial: [phone]

## 2024-07-17 ENCOUNTER — Other Ambulatory Visit: Payer: Self-pay | Admitting: Internal Medicine

## 2024-07-17 DIAGNOSIS — I700 Atherosclerosis of aorta: Secondary | ICD-10-CM

## 2024-07-17 DIAGNOSIS — E785 Hyperlipidemia, unspecified: Secondary | ICD-10-CM

## 2024-07-17 DIAGNOSIS — R931 Abnormal findings on diagnostic imaging of heart and coronary circulation: Secondary | ICD-10-CM

## 2024-08-04 ENCOUNTER — Other Ambulatory Visit: Payer: Self-pay | Admitting: Family Medicine

## 2024-08-04 DIAGNOSIS — F419 Anxiety disorder, unspecified: Secondary | ICD-10-CM

## 2024-08-04 NOTE — Progress Notes (Unsigned)
 Office Visit Note  Patient: [name]             Date of Birth: 1946/06/29           MRN: [id number]             PCP: Jason Leita Repine, FNP (Inactive) Referring: Jason Leita Repine,* Visit Date: 08/18/2024 Occupation: SALES  Subjective:  No chief complaint on file.   History of Present Illness: [name] is a 78 y.o. female ***     Activities of Daily Living:  Patient reports morning stiffness for *** {minute/hour:19697}.   Patient {ACTIONS;DENIES/REPORTS:21021675::Denies} nocturnal pain.  Difficulty dressing/grooming: {ACTIONS;DENIES/REPORTS:21021675::Denies} Difficulty climbing stairs: {ACTIONS;DENIES/REPORTS:21021675::Denies} Difficulty getting out of chair: {ACTIONS;DENIES/REPORTS:21021675::Denies} Difficulty using hands for taps, buttons, cutlery, and/or writing: {ACTIONS;DENIES/REPORTS:21021675::Denies}  No Rheumatology ROS completed.   PMFS History:  Patient Active Problem List   Diagnosis Date Noted   Age-related osteoporosis without current pathological fracture 07/30/2023   Elevated alkaline phosphatase level 07/19/2023   Osteopenia 07/19/2023   Postoperative hypothyroidism 12/12/2021   Pituitary macroadenoma (HCC) 12/12/2021   Fatigue 12/12/2021   Eustachian tube dysfunction 01/12/2021   Left Achilles tendinitis 08/25/2020   Chronic hip pain after total replacement of left hip joint 06/15/2020   Primary localized osteoarthritis of hip 06/24/2019   Primary osteoarthritis of left hip 06/02/2019   S/P lumbar fusion 04/23/2019   Lumbar spinal stenosis 07/16/2018   Renal cyst 07/16/2018   Left lumbar radiculopathy 06/24/2018   Left hip pain 10/25/2017   Greater trochanteric bursitis of left hip 10/03/2017   Anxiety and depression 05/23/2017   Hyperlipidemia 04/21/2016   Routine general medical examination at a health care facility 01/05/2016   Medicare annual wellness visit, subsequent 01/05/2016   Allergic rhinitis 12/29/2015    Vaginal atrophy 10/22/2013   DJD (degenerative joint disease) of knee 10/18/2012   NEPHRECTOMY, HX OF 05/28/2009   Hypothyroidism, postsurgical 07/18/2007   HYPERLIPIDEMIA 07/18/2007   Essential hypertension 07/18/2007   OSTEOPENIA 07/15/2007   History of colonic polyps 07/15/2007    Past Medical History:  Diagnosis Date   Adenomatous colon polyp    Allergy Years ago   Anxiety Years ago   Arthritis    Cataract    Chronic kidney disease    Left kidney removed   Complication of anesthesia    Depression Years ago   Heart murmur    MVP   Hyperlipidemia    Hypertension    Hypothyroidism    PONV (postoperative nausea and vomiting)    Sleep apnea Last few years   Get up 2 times a night to go to bathroom   Thyroid  disease     Family History  Problem Relation Age of Onset   Hyperlipidemia Mother    Transient ischemic attack Mother 92   Thyroid  disease Mother        hyperthyroid   ADD / ADHD Mother    Heart disease Mother    Transient ischemic attack Father        in 17s   Coronary artery disease Father        stents @ 30   Nephrolithiasis Father    Urolithiasis Sister    Diabetes Neg Hx    Colon cancer Neg Hx    Rectal cancer Neg Hx    Stomach cancer Neg Hx    Esophageal cancer Neg Hx    Past Surgical History:  Procedure Laterality Date   ABDOMINAL HYSTERECTOMY     APPENDECTOMY  BACK SURGERY     BREAST EXCISIONAL BIOPSY Right 1983   COLONOSCOPY W/ POLYPECTOMY  2003,2006,2013   [name]   DIAGNOSTIC LAPAROSCOPY  1979   exploratory to get pregnant   DILATION AND CURETTAGE OF UTERUS     X2   EYE SURGERY     JOINT REPLACEMENT     NEPHRECTOMY  2010   Left for 9.8cm benign complex cyst   SPINE SURGERY     THYROIDECTOMY  2000   Dr Queen   TONSILLECTOMY     TOTAL ABDOMINAL HYSTERECTOMY W/ BILATERAL SALPINGOOPHORECTOMY  2003   [name] for dysfunctional menses   TOTAL HIP ARTHROPLASTY Left 06/24/2019   Procedure: TOTAL HIP ARTHROPLASTY ANTERIOR APPROACH;   Surgeon: Beverley Evalene BIRCH, MD;  Location: [facility];  Service: Orthopedics;  Laterality: Left;   TUBAL LIGATION     Social History   Tobacco Use   Smoking status: Former    Current packs/day: 0.00    Average packs/day: 0.5 packs/day for 4.0 years (2.0 ttl pk-yrs)    Types: Cigarettes    Start date: 08/29/1971    Quit date: 08/29/1975    Years since quitting: 48.9   Smokeless tobacco: Never   Tobacco comments:    smoked 1973-1977, up to 1/2 ppd  Vaping Use   Vaping status: Never Used  Substance Use Topics   Alcohol use: Not Currently   Drug use: No   Social History   Social History Narrative   Fun/Hobbies: Grandkids     Immunization History  Administered Date(s) Administered   Fluad Quad(high Dose 65+) 05/24/2020, 06/15/2022   INFLUENZA, HIGH DOSE SEASONAL PF 06/18/2013, 04/21/2016, 05/23/2017, 05/17/2018, 05/27/2019   Influenza Split 06/21/2011, 06/12/2012   Influenza Whole 07/11/2007, 06/10/2008, 06/29/2010   Influenza,inj,Quad PF,6+ Mos 06/10/2014, 05/07/2015   Influenza-Unspecified 05/29/2019   Moderna Covid-19 Fall Seasonal Vaccine 54yrs & older 09/12/2022   Moderna Sars-Covid-2 Vaccination 09/20/2019, 10/18/2019, 06/21/2020   PNEUMOCOCCAL CONJUGATE-20 07/01/2023   Pneumococcal Conjugate-13 11/17/2015   Pneumococcal Polysaccharide-23 09/17/2013   Respiratory Syncytial Virus Vaccine,Recomb Aduvanted(Arexvy) 09/12/2022   Td 01/05/2003   Tdap 02/05/2013   Zoster Recombinant(Shingrix) 10/27/2023   Zoster, Live 11/10/2011     Objective: Vital Signs: There were no vitals taken for this visit.   Physical Exam   Musculoskeletal Exam: ***  CDAI Exam: CDAI Score: -- Patient Global: --; Provider Global: -- Swollen: --; Tender: -- Joint Exam 08/18/2024   No joint exam has been documented for this visit   There is currently no information documented on the homunculus. Go to the Rheumatology activity and complete the homunculus joint exam.  Investigation: No  additional findings.  Imaging: No results found.  Recent Labs: Lab Results  Component Value Date   WBC 9.1 04/27/2023   HGB 15.3 04/27/2023   PLT 329 04/27/2023   NA 140 07/19/2023   K 3.9 07/19/2023   CL 103 07/19/2023   CO2 27 07/19/2023   GLUCOSE 120 (H) 07/19/2023   BUN 19 07/19/2023   CREATININE 0.93 07/19/2023   BILITOT 0.5 03/27/2024   ALKPHOS 163 (H) 08/13/2023   AST 22 03/27/2024   ALT 17 03/27/2024   PROT 7.3 03/27/2024   ALBUMIN 4.7 08/13/2023   CALCIUM  9.8 07/19/2023   GFRAA >60 06/16/2019    Speciality Comments: No specialty comments available.  Procedures:  No procedures performed Allergies: Atorvastatin , Simvastatin, Codeine sulfate, and Erythromycin   Assessment / Plan:     Visit Diagnoses: Polyarthralgia - Discuss OA  Chronic hip pain after total  replacement of left hip joint  Left Achilles tendinitis  Greater trochanteric bursitis of left hip  Age-related osteoporosis without current pathological fracture  S/P lumbar fusion  Spinal stenosis of lumbar region with neurogenic claudication  Hypothyroidism, postsurgical  Pituitary macroadenoma (HCC)  Postoperative hypothyroidism  Essential hypertension  History of hyperlipidemia  History of colonic polyps  Anxiety and depression  Orders: No orders of the defined types were placed in this encounter.  No orders of the defined types were placed in this encounter.   Face-to-face time spent with patient was *** minutes. Greater than 50% of time was spent in counseling and coordination of care.  Follow-Up Instructions: No follow-ups on file.   Waddell CHRISTELLA Craze, PA-C  Note - This record has been created using Dragon software.  Chart creation errors have been sought, but may not always  have been located. Such creation errors do not reflect on  the standard of medical care.

## 2024-08-04 NOTE — Telephone Encounter (Signed)
 Requesting: Xanax  Contract: n/a UDS: n/a Last OV: 05/02/2024 Next OV: 10/30/2024 Last Refill: 07/03/2024, #60--0 RF Database:   Please advise

## 2024-08-10 ENCOUNTER — Other Ambulatory Visit: Payer: Self-pay | Admitting: Internal Medicine

## 2024-08-10 DIAGNOSIS — E890 Postprocedural hypothyroidism: Secondary | ICD-10-CM

## 2024-08-11 ENCOUNTER — Other Ambulatory Visit: Payer: Self-pay | Admitting: Internal Medicine

## 2024-08-11 DIAGNOSIS — E890 Postprocedural hypothyroidism: Secondary | ICD-10-CM

## 2024-08-18 ENCOUNTER — Ambulatory Visit

## 2024-08-18 ENCOUNTER — Encounter: Payer: Self-pay | Admitting: Physician Assistant

## 2024-08-18 ENCOUNTER — Ambulatory Visit: Attending: Physician Assistant | Admitting: Physician Assistant

## 2024-08-18 ENCOUNTER — Other Ambulatory Visit: Payer: Self-pay

## 2024-08-18 VITALS — BP 116/73 | HR 76 | Temp 97.4°F | Resp 14 | Ht 65.5 in | Wt 155.2 lb

## 2024-08-18 DIAGNOSIS — M79671 Pain in right foot: Secondary | ICD-10-CM

## 2024-08-18 DIAGNOSIS — M79641 Pain in right hand: Secondary | ICD-10-CM

## 2024-08-18 DIAGNOSIS — I10 Essential (primary) hypertension: Secondary | ICD-10-CM

## 2024-08-18 DIAGNOSIS — D352 Benign neoplasm of pituitary gland: Secondary | ICD-10-CM

## 2024-08-18 DIAGNOSIS — Z860100 Personal history of colon polyps, unspecified: Secondary | ICD-10-CM | POA: Diagnosis not present

## 2024-08-18 DIAGNOSIS — M7662 Achilles tendinitis, left leg: Secondary | ICD-10-CM

## 2024-08-18 DIAGNOSIS — M810 Age-related osteoporosis without current pathological fracture: Secondary | ICD-10-CM

## 2024-08-18 DIAGNOSIS — M79642 Pain in left hand: Secondary | ICD-10-CM

## 2024-08-18 DIAGNOSIS — G8929 Other chronic pain: Secondary | ICD-10-CM

## 2024-08-18 DIAGNOSIS — M2550 Pain in unspecified joint: Secondary | ICD-10-CM

## 2024-08-18 DIAGNOSIS — M25551 Pain in right hip: Secondary | ICD-10-CM

## 2024-08-18 DIAGNOSIS — E890 Postprocedural hypothyroidism: Secondary | ICD-10-CM

## 2024-08-18 DIAGNOSIS — Z8639 Personal history of other endocrine, nutritional and metabolic disease: Secondary | ICD-10-CM | POA: Diagnosis not present

## 2024-08-18 DIAGNOSIS — Z981 Arthrodesis status: Secondary | ICD-10-CM | POA: Diagnosis not present

## 2024-08-18 DIAGNOSIS — M79672 Pain in left foot: Secondary | ICD-10-CM

## 2024-08-18 DIAGNOSIS — M25552 Pain in left hip: Secondary | ICD-10-CM | POA: Diagnosis not present

## 2024-08-18 DIAGNOSIS — F419 Anxiety disorder, unspecified: Secondary | ICD-10-CM

## 2024-08-18 DIAGNOSIS — R7689 Other specified abnormal immunological findings in serum: Secondary | ICD-10-CM | POA: Diagnosis not present

## 2024-08-18 DIAGNOSIS — M48062 Spinal stenosis, lumbar region with neurogenic claudication: Secondary | ICD-10-CM

## 2024-08-18 DIAGNOSIS — M7062 Trochanteric bursitis, left hip: Secondary | ICD-10-CM

## 2024-08-18 DIAGNOSIS — Z905 Acquired absence of kidney: Secondary | ICD-10-CM

## 2024-08-18 DIAGNOSIS — Z96642 Presence of left artificial hip joint: Secondary | ICD-10-CM

## 2024-08-18 NOTE — Progress Notes (Unsigned)
 Per Waddell Craze, PA-C, place referral to pain management.

## 2024-08-21 ENCOUNTER — Ambulatory Visit: Payer: Self-pay | Admitting: Physician Assistant

## 2024-08-21 LAB — SEDIMENTATION RATE: Sed Rate: 17 mm/h (ref 0–30)

## 2024-08-21 LAB — C-REACTIVE PROTEIN: CRP: <3 mg/L

## 2024-08-21 LAB — RHEUMATOID FACTOR: Rheumatoid fact SerPl-aCnc: 64 [IU]/mL — ABNORMAL HIGH

## 2024-08-21 LAB — MUTATED CITRULLINATED VIMENTIN (MCV) ANTIBODY: MUTATED CITRULLINATED VIMENTIN (MCV) AB: 26 U/mL — ABNORMAL HIGH

## 2024-08-21 LAB — CYCLIC CITRUL PEPTIDE ANTIBODY, IGG: Cyclic Citrullin Peptide Ab: <16 U

## 2024-08-21 LAB — ANTI-NUCLEAR AB-TITER (ANA TITER): ANA Titer 1: 1:40 {titer} — ABNORMAL HIGH

## 2024-08-21 LAB — ANA: Anti Nuclear Antibody (ANA): POSITIVE — AB

## 2024-08-21 NOTE — Progress Notes (Signed)
 Plan to discuss results and possible treatment options at [facility]

## 2024-08-22 ENCOUNTER — Other Ambulatory Visit: Payer: Self-pay | Admitting: *Deleted

## 2024-08-22 DIAGNOSIS — R7689 Other specified abnormal immunological findings in serum: Secondary | ICD-10-CM

## 2024-08-22 DIAGNOSIS — M2550 Pain in unspecified joint: Secondary | ICD-10-CM

## 2024-08-22 NOTE — Telephone Encounter (Signed)
 Please schedule earlier appt, if possible. Thank you. Ok to schedule sooner visit if one becomes available.

## 2024-08-22 NOTE — Progress Notes (Signed)
 Lab results are concerning for RA.   Plan to also add ENA panel at [facility].   Ok to schedule sooner visit if one becomes available.

## 2024-08-26 NOTE — Progress Notes (Unsigned)
 "  Office Visit Note  Patient: [name]             Date of Birth: 10-Sep-1945           MRN: [id number]             PCP: Jason Leita Repine, FNP (Inactive) Referring: No ref. provider found Visit Date: 08/29/2024 Occupation: SALES  Subjective:  Discuss results   History of Present Illness: [name] is a 78 y.o. female with a past medical history of positive RF and polyarthralgia.  Patient presents today to discuss results and the next steps in treatment.   Plaquenil?        Activities of Daily Living:  Patient reports morning stiffness for *** {minute/hour:19697}.   Patient {ACTIONS;DENIES/REPORTS:21021675::Denies} nocturnal pain.  Difficulty dressing/grooming: {ACTIONS;DENIES/REPORTS:21021675::Denies} Difficulty climbing stairs: {ACTIONS;DENIES/REPORTS:21021675::Denies} Difficulty getting out of chair: {ACTIONS;DENIES/REPORTS:21021675::Denies} Difficulty using hands for taps, buttons, cutlery, and/or writing: {ACTIONS;DENIES/REPORTS:21021675::Denies}  No Rheumatology ROS completed.   PMFS History:  Patient Active Problem List   Diagnosis Date Noted   Age-related osteoporosis without current pathological fracture 07/30/2023   Elevated alkaline phosphatase level 07/19/2023   Osteopenia 07/19/2023   Postoperative hypothyroidism 12/12/2021   Pituitary macroadenoma (HCC) 12/12/2021   Fatigue 12/12/2021   Eustachian tube dysfunction 01/12/2021   Left Achilles tendinitis 08/25/2020   Chronic hip pain after total replacement of left hip joint 06/15/2020   Primary localized osteoarthritis of hip 06/24/2019   Primary osteoarthritis of left hip 06/02/2019   S/P lumbar fusion 04/23/2019   Lumbar spinal stenosis 07/16/2018   Renal cyst 07/16/2018   Left lumbar radiculopathy 06/24/2018   Left hip pain 10/25/2017   Greater trochanteric bursitis of left hip 10/03/2017   Anxiety and depression 05/23/2017   Hyperlipidemia 04/21/2016    Routine general medical examination at a health care facility 01/05/2016   Medicare annual wellness visit, subsequent 01/05/2016   Allergic rhinitis 12/29/2015   Vaginal atrophy 10/22/2013   DJD (degenerative joint disease) of knee 10/18/2012   NEPHRECTOMY, HX OF 05/28/2009   Hypothyroidism, postsurgical 07/18/2007   HYPERLIPIDEMIA 07/18/2007   Essential hypertension 07/18/2007   OSTEOPENIA 07/15/2007   History of colonic polyps 07/15/2007    Past Medical History:  Diagnosis Date   Adenomatous colon polyp    Allergy Years ago   Anxiety Years ago   Arthritis    Cataract    Chronic kidney disease    Left kidney removed   Complication of anesthesia    Depression Years ago   Heart murmur    MVP   Hyperlipidemia    Hypertension    Hypothyroidism    PONV (postoperative nausea and vomiting)    Sleep apnea Last few years   Get up 2 times a night to go to bathroom   Thyroid  disease     Family History  Problem Relation Age of Onset   Hyperlipidemia Mother    Transient ischemic attack Mother 52   Thyroid  disease Mother        hyperthyroid   ADD / ADHD Mother    Heart disease Mother    Transient ischemic attack Father        in 64s   Coronary artery disease Father        stents @ 5   Nephrolithiasis Father    Urolithiasis Sister    Diabetes Neg Hx    Colon cancer Neg Hx    Rectal cancer Neg Hx    Stomach cancer Neg Hx  Esophageal cancer Neg Hx    Past Surgical History:  Procedure Laterality Date   ABDOMINAL HYSTERECTOMY     APPENDECTOMY     BACK SURGERY     BREAST EXCISIONAL BIOPSY Right 1983   COLONOSCOPY W/ POLYPECTOMY  2003,2006,2013   [name]   DIAGNOSTIC LAPAROSCOPY  1979   exploratory to get pregnant   DILATION AND CURETTAGE OF UTERUS     X2   EYE SURGERY     JOINT REPLACEMENT     NEPHRECTOMY  2010   Left for 9.8cm benign complex cyst   SPINE SURGERY     THYROIDECTOMY  2000   Dr Queen    TONSILLECTOMY     TOTAL ABDOMINAL HYSTERECTOMY W/ BILATERAL SALPINGOOPHORECTOMY  2003   [name] for dysfunctional menses   TOTAL HIP ARTHROPLASTY Left 06/24/2019   Procedure: TOTAL HIP ARTHROPLASTY ANTERIOR APPROACH;  Surgeon: Beverley Evalene BIRCH, MD;  Location: [facility];  Service: Orthopedics;  Laterality: Left;   TUBAL LIGATION     Social History[1] Social History   Social History Narrative   Fun/Hobbies: Grandkids     Immunization History  Administered Date(s) Administered   Fluad Quad(high Dose 65+) 05/24/2020, 06/15/2022   INFLUENZA, HIGH DOSE SEASONAL PF 06/18/2013, 04/21/2016, 05/23/2017, 05/17/2018, 05/27/2019   Influenza Split 06/21/2011, 06/12/2012   Influenza Whole 07/11/2007, 06/10/2008, 06/29/2010   Influenza,inj,Quad PF,6+ Mos 06/10/2014, 05/07/2015   Influenza-Unspecified 05/29/2019   Moderna Covid-19 Fall Seasonal Vaccine 21yrs & older 09/12/2022   Moderna Sars-Covid-2 Vaccination 09/20/2019, 10/18/2019, 06/21/2020   PNEUMOCOCCAL CONJUGATE-20 07/01/2023   Pneumococcal Conjugate-13 11/17/2015   Pneumococcal Polysaccharide-23 09/17/2013   Respiratory Syncytial Virus Vaccine,Recomb Aduvanted(Arexvy) 09/12/2022   Td 01/05/2003   Tdap 02/05/2013   Zoster Recombinant(Shingrix) 10/27/2023   Zoster, Live 11/10/2011     Objective: Vital Signs: There were no vitals taken for this visit.   Physical Exam Vitals and nursing note reviewed.  Constitutional:      Appearance: She is well-developed.  HENT:     Head: Normocephalic and atraumatic.  Eyes:     Conjunctiva/sclera: Conjunctivae normal.  Cardiovascular:     Rate and Rhythm: Normal rate and regular rhythm.     Heart sounds: Normal heart sounds.  Pulmonary:     Effort: Pulmonary effort is normal.     Breath sounds: Normal breath sounds.  Abdominal:     General: Bowel sounds are normal.     Palpations: Abdomen is soft.  Musculoskeletal:     Cervical back: Normal range of motion.   Lymphadenopathy:     Cervical: No cervical adenopathy.  Skin:    General: Skin is warm and dry.     Capillary Refill: Capillary refill takes less than 2 seconds.  Neurological:     Mental Status: She is alert and oriented to person, place, and time.  Psychiatric:        Behavior: Behavior normal.     Musculoskeletal Exam: ***  CDAI Exam: CDAI Score: -- Patient Global: --; Provider Global: -- Swollen: --; Tender: -- Joint Exam 08/29/2024   No joint exam has been documented for this visit   There is currently no information documented on the homunculus. Go to the Rheumatology activity and complete the homunculus joint exam.  Investigation: No additional findings.  Imaging: XR Foot 2 Views Left Result Date: 08/18/2024 Severe first MTP narrowing and spurring was noted.  PIP and DIP narrowing was noted.  No intertarsal, tibiotalar or subtalar joint space narrowing was noted.  Inferior and posterior calcaneal spurs were  noted.  No erosive changes were noted.  Generalized osteopenia was noted. Impression: These findings suggestive of osteoarthritis of the foot.  XR Foot 2 Views Right Result Date: 08/18/2024 Severe first MTP narrowing and spurring was noted.  PIP and DIP narrowing was noted.  No intertarsal, tibiotalar or subtalar joint space narrowing was noted.  Inferior calcaneal spur was noted.  No erosive changes were noted.  Generalized osteopenia was noted. Impression: These findings suggestive of osteoarthritis of the foot.  XR Hand 2 View Left Result Date: 08/18/2024 CMC, PIP and DIP narrowing was noted.  No MCP, intercarpal or radiocarpal joint space narrowing was noted.  No erosive changes were noted.  Generalized osteopenia was noted. Impression: These findings are suggestive of osteoarthritis of the hand.  XR Hand 2 View Right Result Date: 08/18/2024 CMC, PIP and DIP narrowing was noted.  No MCP, intercarpal or radiocarpal joint space narrowing was noted.  No erosive  changes were noted.  Generalized osteopenia was noted. Impression: These findings are suggestive of osteoarthritis of the hand.   Recent Labs: Lab Results  Component Value Date   WBC 9.1 04/27/2023   HGB 15.3 04/27/2023   PLT 329 04/27/2023   NA 140 07/19/2023   K 3.9 07/19/2023   CL 103 07/19/2023   CO2 27 07/19/2023   GLUCOSE 120 (H) 07/19/2023   BUN 19 07/19/2023   CREATININE 0.93 07/19/2023   BILITOT 0.5 03/27/2024   ALKPHOS 163 (H) 08/13/2023   AST 22 03/27/2024   ALT 17 03/27/2024   PROT 7.3 03/27/2024   ALBUMIN 4.7 08/13/2023   CALCIUM  9.8 07/19/2023   GFRAA >60 06/16/2019    Speciality Comments: No specialty comments available.  Procedures:  No procedures performed Allergies: Atorvastatin , Atorvastatin  calcium , Rosuvastatin , Simvastatin, Codeine sulfate, and Erythromycin   Assessment / Plan:     Visit Diagnoses: Polyarthralgia  Rheumatoid factor positive  Pain in both hands  Pain in both feet  Chronic hip pain after total replacement of left hip joint  Greater trochanteric bursitis of left hip  Age-related osteoporosis without current pathological fracture  S/P lumbar fusion  Spinal stenosis of lumbar region with neurogenic claudication  Postoperative hypothyroidism  Pituitary macroadenoma (HCC)  Essential hypertension  History of hyperlipidemia  History of colonic polyps  Orders: No orders of the defined types were placed in this encounter.  No orders of the defined types were placed in this encounter.   Face-to-face time spent with patient was *** minutes. Greater than 50% of time was spent in counseling and coordination of care.  Follow-Up Instructions: No follow-ups on file.   Waddell CHRISTELLA Craze, PA-C  Note - This record has been created using Dragon software.  Chart creation errors have been sought, but may not always  have been located. Such creation errors do not reflect on  the standard of medical care.      [1] Social  History Tobacco Use   Smoking status: Former    Current packs/day: 0.00    Average packs/day: 0.5 packs/day for 4.0 years (2.0 ttl pk-yrs)    Types: Cigarettes    Start date: 08/29/1971    Quit date: 08/29/1975    Years since quitting: 49.0   Smokeless tobacco: Never   Tobacco comments:    smoked 1973-1977, up to 1/2 ppd  Vaping Use   Vaping status: Never Used  Substance Use Topics   Alcohol use: Not Currently   Drug use: No  "

## 2024-08-29 ENCOUNTER — Telehealth: Payer: Self-pay

## 2024-08-29 ENCOUNTER — Ambulatory Visit: Attending: Physician Assistant | Admitting: Physician Assistant

## 2024-08-29 ENCOUNTER — Encounter: Payer: Self-pay | Admitting: Physician Assistant

## 2024-08-29 VITALS — BP 127/83 | HR 93 | Temp 97.6°F | Resp 16 | Ht 65.5 in | Wt 154.8 lb

## 2024-08-29 DIAGNOSIS — M48062 Spinal stenosis, lumbar region with neurogenic claudication: Secondary | ICD-10-CM

## 2024-08-29 DIAGNOSIS — R7689 Other specified abnormal immunological findings in serum: Secondary | ICD-10-CM

## 2024-08-29 DIAGNOSIS — D352 Benign neoplasm of pituitary gland: Secondary | ICD-10-CM

## 2024-08-29 DIAGNOSIS — I10 Essential (primary) hypertension: Secondary | ICD-10-CM

## 2024-08-29 DIAGNOSIS — M2550 Pain in unspecified joint: Secondary | ICD-10-CM | POA: Diagnosis not present

## 2024-08-29 DIAGNOSIS — M25552 Pain in left hip: Secondary | ICD-10-CM

## 2024-08-29 DIAGNOSIS — M19041 Primary osteoarthritis, right hand: Secondary | ICD-10-CM | POA: Diagnosis not present

## 2024-08-29 DIAGNOSIS — M810 Age-related osteoporosis without current pathological fracture: Secondary | ICD-10-CM

## 2024-08-29 DIAGNOSIS — M19042 Primary osteoarthritis, left hand: Secondary | ICD-10-CM

## 2024-08-29 DIAGNOSIS — Z981 Arthrodesis status: Secondary | ICD-10-CM

## 2024-08-29 DIAGNOSIS — Z96642 Presence of left artificial hip joint: Secondary | ICD-10-CM

## 2024-08-29 DIAGNOSIS — M19072 Primary osteoarthritis, left ankle and foot: Secondary | ICD-10-CM

## 2024-08-29 DIAGNOSIS — M19071 Primary osteoarthritis, right ankle and foot: Secondary | ICD-10-CM

## 2024-08-29 DIAGNOSIS — Z860100 Personal history of colon polyps, unspecified: Secondary | ICD-10-CM

## 2024-08-29 DIAGNOSIS — M79641 Pain in right hand: Secondary | ICD-10-CM

## 2024-08-29 DIAGNOSIS — M7062 Trochanteric bursitis, left hip: Secondary | ICD-10-CM

## 2024-08-29 DIAGNOSIS — E890 Postprocedural hypothyroidism: Secondary | ICD-10-CM | POA: Diagnosis not present

## 2024-08-29 DIAGNOSIS — G8929 Other chronic pain: Secondary | ICD-10-CM

## 2024-08-29 DIAGNOSIS — Z79899 Other long term (current) drug therapy: Secondary | ICD-10-CM

## 2024-08-29 DIAGNOSIS — Z8639 Personal history of other endocrine, nutritional and metabolic disease: Secondary | ICD-10-CM

## 2024-08-29 NOTE — Patient Instructions (Signed)
 Standing Labs We placed an order today for your standing lab work.   Please have your standing labs drawn in 1 month, 3 months, and then every 5 months.  Please have your labs drawn 2 weeks prior to your appointment so that the provider can discuss your lab results at your appointment, if possible.  Please note that you may see your imaging and lab results in MyChart before we have reviewed them. We will contact you once all results are reviewed. Please allow our office up to 72 hours to thoroughly review all of the results before contacting the office for clarification of your results.  WALK-IN LAB HOURS  Monday through Thursday from 8:00 am - 4:30 pm and Friday from 8:00 am-12:00 pm.  Patients with office visits requiring labs will be seen before walk-in labs.  You may encounter longer than normal wait times. Please allow additional time. Wait times may be shorter on  Monday and Thursday afternoons.  We do not book appointments for walk-in labs. We appreciate your patience and understanding with our staff.   Labs are drawn by [facility]. Please bring your co-pay at the time of your lab draw.  You may receive a bill from [facility] for your lab work.  Please note if you are on Hydroxychloroquine and and an order has been placed for a Hydroxychloroquine level,  you will need to have it drawn 4 hours or more after your last dose.  If you wish to have your labs drawn at another location, please call the office 24 hours in advance so we can fax the orders.  The office is located at [address], [place], [place] [address]   If you have any questions regarding directions or hours of operation,  please call [phone].   As a reminder, please drink plenty of water  prior to coming for your lab work. Thanks!    Hydroxychloroquine Tablets What is this medication? HYDROXYCHLOROQUINE (hye drox ee KLOR oh kwin) treats autoimmune conditions, such as rheumatoid arthritis and lupus. It works  by slowing down an overactive immune system. It may also be used to prevent and treat malaria. It works by killing the parasite that causes malaria. It belongs to a group of medications called DMARDs. This medicine may be used for other purposes; ask your health care provider or pharmacist if you have questions. COMMON BRAND NAME(S): Plaquenil, Quineprox, SOVUNA What should I tell my care team before I take this medication? They need to know if you have any of these conditions: Diabetes Eye disease, vision problems Frequently drink alcohol G6PD deficiency Heart disease Irregular heartbeat or rhythm Kidney disease Liver disease Porphyria Psoriasis An unusual or allergic reaction to hydroxychloroquine, other medications, foods, dyes, or preservatives Pregnant or trying to get pregnant Breastfeeding How should I use this medication? Take this medication by mouth with water . Take it as directed on the prescription label. Do not cut, crush, or chew this medication. Swallow the tablets whole. Take it with food. Do not take it more than directed. Take all of this medication unless your care team tells you to stop it early. Keep taking it even if you think you are better. Take products with antacids in them at a different time of day than this medication. Take this medication 4 hours before or 4 hours after antacids. Talk to your care team if you have questions. Talk to your care team about the use of this medication in children. While this medication may be prescribed for  selected conditions, precautions do apply. Overdosage: If you think you have taken too much of this medicine contact a poison control center or emergency room at once. NOTE: This medicine is only for you. Do not share this medicine with others. What if I miss a dose? If you miss a dose, take it as soon as you can. If it is almost time for your next dose, take only that dose. Do not take double or extra doses. What may interact  with this medication? Do not take this medication with any of the following: Cisapride Dronedarone Pimozide Thioridazine This medication may also interact with the following: Ampicillin Antacids Cimetidine Cyclosporine Digoxin Kaolin Medications for diabetes, such as insulin, glipizide, glyburide Medications for seizures, such as carbamazepine, phenobarbital, phenytoin Mefloquine Methotrexate Other medications that cause heart rhythm changes Praziquantel This list may not describe all possible interactions. Give your health care provider a list of all the medicines, herbs, non-prescription drugs, or dietary supplements you use. Also tell them if you smoke, drink alcohol, or use illegal drugs. Some items may interact with your medicine. What should I watch for while using this medication? Visit your care team for regular checks on your progress. Tell your care team if your symptoms do not start to get better or if they get worse. You may need blood work done while you are taking this medication. If you take other medications that can affect heart rhythm, you may need more testing. Talk to your care team if you have questions. Your vision may be tested before and during use of this medication. Tell your care team right away if you have any change in your eyesight. This medication may cause serious skin reactions. They can happen weeks to months after starting the medication. Contact your care team right away if you notice fevers or flu-like symptoms with a rash. The rash may be red or purple and then turn into blisters or peeling of the skin. Or, you might notice a red rash with swelling of the face, lips or lymph nodes in your neck or under your arms. If you or your family notice any changes in your behavior, such as new or worsening depression, thoughts of harming yourself, anxiety, or other unusual or disturbing thoughts, or memory loss, call your care team right away. What side effects  may I notice from receiving this medication? Side effects that you should report to your care team as soon as possible: Allergic reactions--skin rash, itching, hives, swelling of the face, lips, tongue, or throat Aplastic anemia--unusual weakness or fatigue, dizziness, headache, trouble breathing, increased bleeding or bruising Change in vision Heart rhythm changes--fast or irregular heartbeat, dizziness, feeling faint or lightheaded, chest pain, trouble breathing Infection--fever, chills, cough, or sore throat Low blood sugar (hypoglycemia)--tremors or shaking, anxiety, sweating, cold or clammy skin, confusion, dizziness, rapid heartbeat Muscle injury--unusual weakness or fatigue, muscle pain, dark yellow or brown urine, decrease in amount of urine Pain, tingling, or numbness in the hands or feet Rash, fever, and swollen lymph nodes Redness, blistering, peeling, or loosening of the skin, including inside the mouth Thoughts of suicide or self-harm, worsening mood, or feelings of depression Unusual bruising or bleeding Side effects that usually do not require medical attention (report to your care team if they continue or are bothersome): Diarrhea Headache Nausea Stomach pain Vomiting This list may not describe all possible side effects. Call your doctor for medical advice about side effects. You may report side effects to FDA at 1-800-FDA-1088. Where should  I keep my medication? Keep out of the reach of children and pets. Store at room temperature up to 30 degrees C (86 degrees F). Protect from light. Get rid of any unused medication after the expiration date. To get rid of medications that are no longer needed or have expired: Take the medication to a medication take-back program. Check with your pharmacy or law enforcement to find a location. If you cannot return the medication, check the label or package insert to see if the medication should be thrown out in the garbage or flushed down  the toilet. If you are not sure, ask your care team. If it is safe to put it in the trash, empty the medication out of the container. Mix the medication with cat litter, dirt, coffee grounds, or other unwanted substance. Seal the mixture in a bag or container. Put it in the trash. NOTE: This sheet is a summary. It may not cover all possible information. If you have questions about this medicine, talk to your doctor, pharmacist, or health care provider.  2024 Elsevier/Gold Standard (2022-02-20 00:00:00)

## 2024-08-29 NOTE — Telephone Encounter (Signed)
 Patient to start Plaquenil 200 mg 1 tablet by mouth twice daily Monday through Friday pending labs. Consent placed in scan place.

## 2024-09-02 ENCOUNTER — Other Ambulatory Visit: Payer: Self-pay | Admitting: Family

## 2024-09-02 ENCOUNTER — Ambulatory Visit: Payer: Self-pay | Admitting: Physician Assistant

## 2024-09-02 DIAGNOSIS — F32A Depression, unspecified: Secondary | ICD-10-CM

## 2024-09-02 LAB — CBC WITH DIFFERENTIAL/PLATELET
Absolute Lymphocytes: 1251 {cells}/uL (ref 850–3900)
Absolute Monocytes: 786 {cells}/uL (ref 200–950)
Basophils Absolute: 58 {cells}/uL (ref 0–200)
Basophils Relative: 0.6 %
Eosinophils Absolute: 310 {cells}/uL (ref 15–500)
Eosinophils Relative: 3.2 %
HCT: 45.2 % (ref 35.9–46.0)
Hemoglobin: 15.1 g/dL (ref 11.7–15.5)
MCH: 32.3 pg (ref 27.0–33.0)
MCHC: 33.4 g/dL (ref 31.6–35.4)
MCV: 96.6 fL (ref 81.4–101.7)
MPV: 9.7 fL (ref 7.5–12.5)
Monocytes Relative: 8.1 %
Neutro Abs: 7294 {cells}/uL (ref 1500–7800)
Neutrophils Relative %: 75.2 %
Platelets: 321 Thousand/uL (ref 140–400)
RBC: 4.68 Million/uL (ref 3.80–5.10)
RDW: 12.8 % (ref 11.0–15.0)
Total Lymphocyte: 12.9 %
WBC: 9.7 Thousand/uL (ref 3.8–10.8)

## 2024-09-02 LAB — SJOGRENS SYNDROME-B EXTRACTABLE NUCLEAR ANTIBODY: SSB (La) (ENA) Antibody, IgG: <1 AI

## 2024-09-02 LAB — C3 AND C4
C3 Complement: 197 mg/dL — ABNORMAL HIGH (ref 83–193)
C4 Complement: 27 mg/dL (ref 15–57)

## 2024-09-02 LAB — COMPREHENSIVE METABOLIC PANEL WITH GFR
AG Ratio: 1.6 (calc) (ref 1.0–2.5)
ALT: 24 U/L (ref 6–29)
AST: 21 U/L (ref 10–35)
Albumin: 4.5 g/dL (ref 3.6–5.1)
Alkaline phosphatase (APISO): 76 U/L (ref 37–153)
BUN: 19 mg/dL (ref 7–25)
CO2: 29 mmol/L (ref 20–32)
Calcium: 9.6 mg/dL (ref 8.6–10.4)
Chloride: 105 mmol/L (ref 98–110)
Creat: 0.8 mg/dL (ref 0.60–1.00)
Globulin: 2.8 g/dL (ref 1.9–3.7)
Glucose, Bld: 101 mg/dL — ABNORMAL HIGH (ref 65–99)
Potassium: 4.4 mmol/L (ref 3.5–5.3)
Sodium: 142 mmol/L (ref 135–146)
Total Bilirubin: 0.6 mg/dL (ref 0.2–1.2)
Total Protein: 7.3 g/dL (ref 6.1–8.1)
eGFR: 75 mL/min/1.73m2

## 2024-09-02 LAB — CARDIOLIPIN ANTIBODIES, IGG, IGM, IGA
Anticardiolipin IgA: <2 [APL'U]/mL
Anticardiolipin IgG: <2 [GPL'U]/mL
Anticardiolipin IgM: <2 [MPL'U]/mL

## 2024-09-02 LAB — ANTI-DNA ANTIBODY, DOUBLE-STRANDED: ds DNA Ab: 1 [IU]/mL

## 2024-09-02 LAB — BETA-2 GLYCOPROTEIN ANTIBODIES
Beta-2 Glyco 1 IgA: <2 U/mL
Beta-2 Glyco 1 IgM: <2 U/mL
Beta-2 Glyco I IgG: <2 U/mL

## 2024-09-02 LAB — ANTI-SMITH ANTIBODY: ENA SM Ab Ser-aCnc: <1 AI

## 2024-09-02 LAB — SJOGRENS SYNDROME-A EXTRACTABLE NUCLEAR ANTIBODY: SSA (Ro) (ENA) Antibody, IgG: <1 AI

## 2024-09-02 LAB — ANTI-SCLERODERMA ANTIBODY: Scleroderma (Scl-70) (ENA) Antibody, IgG: <1 AI

## 2024-09-02 LAB — RNP ANTIBODY: Ribonucleic Protein(ENA) Antibody, IgG: <1 AI

## 2024-09-02 LAB — CENTROMERE ANTIBODIES: Centromere Ab Screen: <1 AI

## 2024-09-02 MED ORDER — HYDROXYCHLOROQUINE SULFATE 200 MG PO TABS: ORAL_TABLET | ORAL | 2 refills | Status: AC

## 2024-09-02 NOTE — Progress Notes (Signed)
 CBC and CMP WNL Smith negative  RNP negative  Beta-2  glycoprotein negative and anticardiolipin antibodies negative  Scl-70-, Ro-, La-, dsDNA negative, centromere negative.   Great news!    Ok to start plaquenil -please pend prescription

## 2024-09-02 NOTE — Telephone Encounter (Signed)
 Addressed in result note.

## 2024-09-02 NOTE — Addendum Note (Signed)
 Addended by: Doral Ventrella L on: 09/02/2024 10:00 AM   Modules accepted: Orders

## 2024-09-16 NOTE — Telephone Encounter (Signed)
 Can discuss alternatives at next visit but the other medications have more risks/potential for adverse effects.

## 2024-09-16 NOTE — Telephone Encounter (Signed)
 Please advise.

## 2024-09-16 NOTE — Telephone Encounter (Signed)
 Patient advised Can discuss alternatives at next visit but the other medications have more risks/potential for adverse effects. Patient verbalized understanding.

## 2024-09-23 NOTE — Telephone Encounter (Signed)
 Prolia  VOB initiated via [email or web address]  Next Prolia  inj DUE: 10/20/24

## 2024-09-25 ENCOUNTER — Ambulatory Visit (INDEPENDENT_AMBULATORY_CARE_PROVIDER_SITE_OTHER): Admitting: *Deleted

## 2024-09-25 VITALS — BP 120/80 | Ht 65.0 in | Wt 152.0 lb

## 2024-09-25 DIAGNOSIS — Z0000 Encounter for general adult medical examination without abnormal findings: Secondary | ICD-10-CM

## 2024-09-25 DIAGNOSIS — M1990 Unspecified osteoarthritis, unspecified site: Secondary | ICD-10-CM

## 2024-09-25 DIAGNOSIS — M810 Age-related osteoporosis without current pathological fracture: Secondary | ICD-10-CM

## 2024-09-25 DIAGNOSIS — N200 Calculus of kidney: Secondary | ICD-10-CM

## 2024-09-25 DIAGNOSIS — M48062 Spinal stenosis, lumbar region with neurogenic claudication: Secondary | ICD-10-CM

## 2024-09-25 DIAGNOSIS — F419 Anxiety disorder, unspecified: Secondary | ICD-10-CM

## 2024-09-25 DIAGNOSIS — Z905 Acquired absence of kidney: Secondary | ICD-10-CM

## 2024-09-25 DIAGNOSIS — S76011D Strain of muscle, fascia and tendon of right hip, subsequent encounter: Secondary | ICD-10-CM

## 2024-09-25 DIAGNOSIS — M1610 Unilateral primary osteoarthritis, unspecified hip: Secondary | ICD-10-CM

## 2024-09-25 NOTE — Patient Instructions (Addendum)
 [name],  Thank you for taking the time for your Medicare Wellness Visit. I appreciate your continued commitment to your health goals. Please review the care plan we discussed, and feel free to reach out if I can assist you further.  Please note that Annual Wellness Visits do not include a physical exam. Some assessments may be limited, especially if the visit was conducted virtually. If needed, we may recommend an in-person follow-up with your provider.  Goal: To have more stamina   Ongoing Care Seeing your primary care provider every 3 to 6 months helps us  monitor your health and provide consistent, personalized care.   Dr Antonio Meth:  10/30/24 11am Medicare AWV: 09/28/25 1:40pm, telephone   Referrals If a referral was made during today's visit and you haven't received any updates within two weeks, please contact the referred provider directly to check on the status.  Bone Density due 07/23/25 [facility]): [phone]   Recommended Screenings: You will need to get the following vaccines at your local pharmacy:  Tetanus and COVID  Health Maintenance  Topic Date Due   DTaP/Tdap/Td vaccine (3 - Td or Tdap) 02/06/2023   Medicare Annual Wellness Visit  12/27/2023   COVID-19 Vaccine (5 - 2025-26 season) 04/28/2024   Pneumococcal Vaccine for age over 73  Completed   Flu Shot  Completed   Osteoporosis screening with Bone Density Scan  Completed   Hepatitis C Screening  Completed   Zoster (Shingles) Vaccine  Completed   Meningitis B Vaccine  Aged Out   Breast Cancer Screening  Discontinued   Colon Cancer Screening  Discontinued       09/23/2024   12:47 PM  Advanced Directives  Does Patient Have a Medical Advance Directive? Yes  Type of Estate Agent of [place];Living will  Copy of Healthcare Power of Attorney in Chart? Yes - validated most recent copy scanned in chart (See row information)  Once completed and notarized, you may return a copy of  your Advanced Directive(s) by either of the following: Bring a copy of your health care power of attorney and living will to the office to be added to your chart at your convenience. You can mail a copy to [facility] [address] [place], [place] [address] or email to [email or web address]   Vision: Annual vision screenings are recommended for early detection of glaucoma, cataracts, and diabetic retinopathy. These exams can also reveal signs of chronic conditions such as diabetes and high blood pressure.  Dental: Annual dental screenings help detect early signs of oral cancer, gum disease, and other conditions linked to overall health, including heart disease and diabetes.  Please see the attached documents for additional preventive care recommendations.

## 2024-09-25 NOTE — Progress Notes (Signed)
 "  Please attest this visit in the absence of patient primary care provider.   Chief Complaint  Patient presents with   Medicare Wellness     Subjective:   [name] is a 79 y.o. female who presents for a Medicare Annual Wellness Visit.  Visit info / Clinical Intake: Medicare Wellness Visit Type:: Subsequent Annual Wellness Visit Persons participating in visit and providing information:: patient Medicare Wellness Visit Mode:: Telephone If telephone:: video declined Since this visit was completed virtually, some vitals may be partially provided or unavailable. Missing vitals are due to the limitations of the virtual format.: Unable to obtain vitals - no equipment If Telephone or Video please confirm:: I connected with patient using audio/video enable telemedicine. I verified patient identity with two identifiers, discussed telehealth limitations, and patient agreed to proceed. Patient Location:: home Provider Location:: office Interpreter Needed?: No Pre-visit prep was completed: yes AWV questionnaire completed by patient prior to visit?: yes Living arrangements:: (Patient-Rptd) lives with spouse/significant other Patient's Overall Health Status Rating: (!) (Patient-Rptd) fair Typical amount of pain: (Patient-Rptd) some Does pain affect daily life?: (!) (Patient-Rptd) yes Are you currently prescribed opioids?: no  Dietary Habits and Nutritional Risks How many meals a day?: (Patient-Rptd) 3 Eats fruit and vegetables daily?: (Patient-Rptd) yes Most meals are obtained by: (Patient-Rptd) preparing own meals In the last 2 weeks, have you had any of the following?: none Diabetic:: no  Functional Status Activities of Daily Living (to include ambulation/medication): (Patient-Rptd) Independent Ambulation: Independent with device- listed below Home Assistive Devices/Equipment: [name] (specify Type) Medication Administration: (Patient-Rptd) Independent Home Management (perform  basic housework or laundry): (Patient-Rptd) Independent Manage your own finances?: (Patient-Rptd) yes Primary transportation is: (Patient-Rptd) driving Concerns about vision?: no *vision screening is required for [facility]* (Up to date with Dr Cleotilde) Concerns about hearing?: no (has tinnitus but feels like she hears well enough without hearing aid)  Fall Screening Falls in the past year?: 1 (Fell in November and is seeing Beverley Millman for hip injury) Number of falls in past year: (Patient-Rptd) 1 Was there an injury with Fall?: (Patient-Rptd) 1 Fall Risk Category Calculator: (Patient-Rptd) 3 Patient Fall Risk Level: (Patient-Rptd) High Fall Risk  Fall Risk Patient at Risk for Falls Due to: History of fall(s); Orthopedic patient Fall risk Follow up: Falls evaluation completed  Home and Transportation Safety: All rugs have non-skid backing?: (Patient-Rptd) yes All stairs or steps have railings?: (Patient-Rptd) yes Grab bars in the bathtub or shower?: (!) (Patient-Rptd) no Have non-skid surface in bathtub or shower?: (Patient-Rptd) yes Good home lighting?: (Patient-Rptd) yes Regular seat belt use?: (Patient-Rptd) yes Hospital stays in the last year:: (Patient-Rptd) no  Cognitive Assessment Difficulty concentrating, remembering, or making decisions? : (Patient-Rptd) no Will 6CIT or Mini Cog be Completed: yes What year is it?: 0 points What month is it?: 0 points Give patient an address phrase to remember (5 components): [address] [place]  About what time is it?: 0 points Count backwards from 20 to 1: 0 points Say the months of the year in reverse: 0 points Repeat the address phrase from earlier: 0 points 6 CIT Score: 0 points  Advance Directives (For Healthcare) Does Patient Have a Medical Advance Directive?: Yes (pt is working on getting living will in place and requests papers) Type of Advance Directive: Healthcare Power of [place]; Living will Copy of  Healthcare Power of Attorney in Chart?: Yes - validated most recent copy scanned in chart (See row information) Copy of Living Will in  Chart?: No - copy requested  Reviewed/Updated  Reviewed/Updated: Reviewed All (Medical, Surgical, Family, Medications, Allergies, Care Teams, Patient Goals)    Allergies (verified) Atorvastatin , Atorvastatin  calcium , Rosuvastatin , Simvastatin, Codeine sulfate, and Erythromycin   Current Medications (verified) Outpatient Encounter Medications as of 09/25/2024  Medication Sig   Acetaminophen  (TYLENOL  ARTHRITIS PAIN PO) Take by mouth.   ALPRAZolam  (XANAX ) 0.25 MG tablet TAKE 2 TABLETS (0.5 MG TOTAL) BY MOUTH AT BEDTIME AS NEEDED FOR ANXIETY.   amLODipine  (NORVASC ) 10 MG tablet Take 1 tablet (10 mg total) by mouth daily.   benazepril  (LOTENSIN ) 40 MG tablet Take 1 tablet (40 mg total) by mouth daily.   Cholecalciferol (D3 ADULT PO) Take by mouth.   cyanocobalamin  (VITAMIN B12) 1000 MCG tablet Take by mouth.   Evolocumab  (REPATHA  SURECLICK) 140 MG/ML SOAJ INJECT 140 MG INTO THE SKIN EVERY 14 (FOURTEEN) DAYS.   ezetimibe  (ZETIA ) 10 MG tablet Take 1 tablet (10 mg total) by mouth daily.   levothyroxine  (SYNTHROID ) 88 MCG tablet 1.5 tabs on Sundays, 1 tablet Monday through Saturday   meloxicam  (MOBIC ) 15 MG tablet Take 1 tablet (15 mg total) by mouth daily.   metoprolol  succinate (TOPROL -XL) 50 MG 24 hr tablet TAKE 1 TABLET BY MOUTH EVERY DAY WITH OR IMMEDIATELY FOLLOWING A MEAL (Patient taking differently: 25 mg daily.)   Multiple Vitamins-Minerals (CENTRUM PO) Take 1 tablet by mouth daily.   potassium citrate  (UROCIT-K ) 10 MEQ (1080 MG) SR tablet Take 10 mEq by mouth daily.   hydroxychloroquine  (PLAQUENIL ) 200 MG tablet Take 1 tablet 200 mg BID Monday-Friday (Patient not taking: Reported on 09/25/2024)   [DISCONTINUED] Multiple Vitamin (MULTI-VITAMIN) tablet Take 1 tablet by mouth daily. (Patient not taking: Reported on 08/29/2024)   [DISCONTINUED] NON FORMULARY  Take 2,500 Units by mouth. daily (Patient not taking: Reported on 08/29/2024)   Facility-Administered Encounter Medications as of 09/25/2024  Medication   denosumab  (PROLIA ) injection 60 mg   [START ON 10/19/2024] denosumab  (PROLIA ) injection 60 mg    History: Past Medical History:  Diagnosis Date   Adenomatous colon polyp    Allergy Years ago   Anxiety Years ago   Arthritis    Cataract    Chronic kidney disease    Left kidney removed   Complication of anesthesia    Depression Years ago   Heart murmur    MVP   Hyperlipidemia    Hypertension    Hypothyroidism    PONV (postoperative nausea and vomiting)    Rheumatoid arthritis (HCC) 2025   Waddell Craze   Sleep apnea Last few years   Get up 2 times a night to go to bathroom   Thyroid  disease    Past Surgical History:  Procedure Laterality Date   ABDOMINAL HYSTERECTOMY     APPENDECTOMY     BACK SURGERY     BREAST EXCISIONAL BIOPSY Right 1983   COLONOSCOPY W/ POLYPECTOMY  2003,2006,2013   [name]   DIAGNOSTIC LAPAROSCOPY  1979   exploratory to get pregnant   DILATION AND CURETTAGE OF UTERUS     X2   EYE SURGERY     JOINT REPLACEMENT     NEPHRECTOMY  2010   Left for 9.8cm benign complex cyst   SPINE SURGERY     THYROIDECTOMY  2000   Dr Queen   TONSILLECTOMY     TOTAL ABDOMINAL HYSTERECTOMY W/ BILATERAL SALPINGOOPHORECTOMY  2003   [name] for dysfunctional menses   TOTAL HIP ARTHROPLASTY Left 06/24/2019   Procedure: TOTAL HIP ARTHROPLASTY ANTERIOR APPROACH;  Surgeon: Beverley Evalene BIRCH, MD;  Location: [facility];  Service: Orthopedics;  Laterality: Left;   TUBAL LIGATION     Family History  Problem Relation Age of Onset   Hyperlipidemia Mother    Transient ischemic attack Mother 41   Thyroid  disease Mother        hyperthyroid   ADD / ADHD Mother    Heart disease Mother    Transient ischemic attack Father        in 14s   Coronary artery disease Father        stents @ 23   Nephrolithiasis Father    Urolithiasis  Sister    Urolithiasis Sister    Multiple myeloma Sister    Diabetes Neg Hx    Colon cancer Neg Hx    Rectal cancer Neg Hx    Stomach cancer Neg Hx    Esophageal cancer Neg Hx    Social History   Occupational History   Occupation: Careers adviser   Occupation: retired  Tobacco Use   Smoking status: Former    Current packs/day: 0.00    Average packs/day: 0.5 packs/day for 4.0 years (2.0 ttl pk-yrs)    Types: Cigarettes    Start date: 08/29/1971    Quit date: 08/29/1975    Years since quitting: 49.1   Smokeless tobacco: Never   Tobacco comments:    smoked 1973-1977, up to 1/2 ppd  Vaping Use   Vaping status: Never Used  Substance and Sexual Activity   Alcohol use: Yes    Comment: 1 drink every 2 months   Drug use: No   Sexual activity: Yes    Comment: HYST-1st intercourse 18 yo-5 partners   Tobacco Counseling Counseling given: Not Answered Tobacco comments: smoked 1973-1977, up to 1/2 ppd  SDOH Screenings   Food Insecurity: No Food Insecurity (09/25/2024)  Housing: Low Risk (09/25/2024)  Transportation Needs: No Transportation Needs (09/25/2024)  Utilities: Not At Risk (09/25/2024)  Alcohol Screen: Low Risk (12/27/2023)  Depression (PHQ2-9): Medium Risk (09/25/2024)  Financial Resource Strain: Low Risk (04/29/2024)  Physical Activity: Insufficiently Active (09/25/2024)  Social Connections: Socially Integrated (04/29/2024)  Stress: Stress Concern Present (09/25/2024)  Tobacco Use: Medium Risk (09/25/2024)   See flowsheets for full screening details  Depression Screen PHQ 2 & 9 Depression Scale- Over the past 2 weeks, how often have you been bothered by any of the following problems? Little interest or pleasure in doing things: 0 Feeling down, depressed, or hopeless (PHQ Adolescent also includes...irritable): 0 PHQ-2 Total Score: 0 Trouble falling or staying asleep, or sleeping too much: 3 (takes xanax  and it helps) Feeling tired or having little energy: 3 Poor appetite or  overeating (PHQ Adolescent also includes...weight loss): 0 Feeling bad about yourself - or that you are a failure or have let yourself or your family down: 0 Trouble concentrating on things, such as reading the newspaper or watching television (PHQ Adolescent also includes...like school work): 0 Moving or speaking so slowly that other people could have noticed. Or the opposite - being so fidgety or restless that you have been moving around a lot more than usual: 0 Thoughts that you would be better off dead, or of hurting yourself in some way: 0 PHQ-9 Total Score: 6 If you checked off any problems, how difficult have these problems made it for you to do your work, take care of things at home, or get along with other people?: Not difficult at all  Depression Treatment Depression Interventions/Treatment : Counseling (pt  may reach out to a new counselor that is covered by insurance)     Goals Addressed             This Visit's Progress    To have more stamina               Objective:    Today's Vitals   09/25/24 1415  BP: 120/80  Weight: 152 lb (68.9 kg)  Height: 5' 5 (1.651 m)   Body mass index is 25.29 kg/m.  Hearing/Vision screen No results found. Immunizations and Health Maintenance Health Maintenance  Topic Date Due   DTaP/Tdap/Td (3 - Td or Tdap) 02/06/2023   COVID-19 Vaccine (5 - 2025-26 season) 04/28/2024   Medicare Annual Wellness (AWV)  09/25/2025   Pneumococcal Vaccine: 50+ Years  Completed   Influenza Vaccine  Completed   Bone Density Scan  Completed   Hepatitis C Screening  Completed   Zoster Vaccines- Shingrix  Completed   Meningococcal B Vaccine  Aged Out   Mammogram  Discontinued   Colonoscopy  Discontinued        Assessment/Plan:  This is a routine wellness examination for [place].  Patient Care Team: Jason Leita Repine, FNP (Inactive) as PCP - General (Internal Medicine) Mona Vinie BROCKS, MD as PCP - Cardiology (Cardiology) Claudene Arthea HERO, DO as Consulting Physician (Family Medicine) Roz Anes, MD as Consulting Physician (Ophthalmology) [facility], Donell Cardinal, MD as Attending Physician (Endocrinology) Cheryl Waddell HERO DEVONNA as Physician Assistant (Physician Assistant) Cleotilde Elspeth CROME, OD (Optometry) [facility], [facility] (Orthopedic Surgery) Renda Glance, MD as Consulting Physician (Urology)  I have personally reviewed and noted the following in the patients chart:   Medical and social history Use of alcohol, tobacco or illicit drugs  Current medications and supplements including opioid prescriptions. Functional ability and status Nutritional status Physical activity Advanced directives List of other physicians Hospitalizations, surgeries, and ER visits in previous 12 months Vitals Screenings to include cognitive, depression, and falls Referrals and appointments  No orders of the defined types were placed in this encounter.  In addition, I have reviewed and discussed with patient certain preventive protocols, quality metrics, and best practice recommendations. A written personalized care plan for preventive services as well as general preventive health recommendations were provided to patient.   Lolita Libra, CMA   09/25/2024   Return in 1 year (on 09/25/2025).  After Visit Summary: (MyChart) Due to this being a telephonic visit, the after visit summary with patients personalized plan was offered to patient via MyChart   Nurse Notes: HM Addressed: Vaccines Due: Tetanus, COVID DEXA ordered  Referrals placed due to insurance requirement for continuity of care.  "

## 2024-09-26 ENCOUNTER — Other Ambulatory Visit: Payer: Self-pay

## 2024-09-26 DIAGNOSIS — I10 Essential (primary) hypertension: Secondary | ICD-10-CM

## 2024-09-26 MED ORDER — METOPROLOL SUCCINATE ER 50 MG PO TB24: ORAL_TABLET | ORAL | 3 refills | Status: AC

## 2024-09-26 NOTE — Telephone Encounter (Signed)
 Copied from CRM #[id number]. Topic: Clinical - Medication Question >> Sep 26, 2024 10:22 AM Gustabo D wrote: Pt wants top know why this was denied -metoprolol  succinate (TOPROL -XL) 50 MG 24 hr tablet Give pt a call

## 2024-09-30 NOTE — Progress Notes (Unsigned)
 "  Office Visit Note  Patient: [name]             Date of Birth: 08-Mar-1946           MRN: [id number]             PCP: Jason Leita Repine, FNP (Inactive) Referring: Antonio Meth, Jamee JONELLE, DO Visit Date: 10/13/2024 Occupation: SALES  Subjective:  No chief complaint on file.   History of Present Illness: [name] is a 79 y.o. female ***     Activities of Daily Living:  Patient reports morning stiffness for *** {minute/hour:19697}.   Patient {ACTIONS;DENIES/REPORTS:21021675::Denies} nocturnal pain.  Difficulty dressing/grooming: {ACTIONS;DENIES/REPORTS:21021675::Denies} Difficulty climbing stairs: {ACTIONS;DENIES/REPORTS:21021675::Denies} Difficulty getting out of chair: {ACTIONS;DENIES/REPORTS:21021675::Denies} Difficulty using hands for taps, buttons, cutlery, and/or writing: {ACTIONS;DENIES/REPORTS:21021675::Denies}  No Rheumatology ROS completed.   PMFS History:  Patient Active Problem List   Diagnosis Date Noted   Age-related osteoporosis without current pathological fracture 07/30/2023   Elevated alkaline phosphatase level 07/19/2023   Osteopenia 07/19/2023   Postoperative hypothyroidism 12/12/2021   Pituitary macroadenoma (HCC) 12/12/2021   Fatigue 12/12/2021   Eustachian tube dysfunction 01/12/2021   Left Achilles tendinitis 08/25/2020   Chronic hip pain after total replacement of left hip joint 06/15/2020   Primary localized osteoarthritis of hip 06/24/2019   Primary osteoarthritis of left hip 06/02/2019   S/P lumbar fusion 04/23/2019   Lumbar spinal stenosis 07/16/2018   Renal cyst 07/16/2018   Left lumbar radiculopathy 06/24/2018   Left hip pain 10/25/2017   Greater trochanteric bursitis of left hip 10/03/2017   Anxiety and depression 05/23/2017   Hyperlipidemia 04/21/2016   Routine general medical examination at a health care facility 01/05/2016   Medicare annual wellness visit, subsequent 01/05/2016   Allergic rhinitis 12/29/2015    Vaginal atrophy 10/22/2013   DJD (degenerative joint disease) of knee 10/18/2012   NEPHRECTOMY, HX OF 05/28/2009   Hypothyroidism, postsurgical 07/18/2007   HYPERLIPIDEMIA 07/18/2007   Essential hypertension 07/18/2007   OSTEOPENIA 07/15/2007   History of colonic polyps 07/15/2007    Past Medical History:  Diagnosis Date   Adenomatous colon polyp    Allergy Years ago   Anxiety Years ago   Arthritis    Cataract    Chronic kidney disease    Left kidney removed   Complication of anesthesia    Depression Years ago   Heart murmur    MVP   Hyperlipidemia    Hypertension    Hypothyroidism    PONV (postoperative nausea and vomiting)    Rheumatoid arthritis (HCC) 2025   Waddell Craze   Sleep apnea Last few years   Get up 2 times a night to go to bathroom   Thyroid  disease     Family History  Problem Relation Age of Onset   Hyperlipidemia Mother    Transient ischemic attack Mother 33   Thyroid  disease Mother        hyperthyroid   ADD / ADHD Mother    Heart disease Mother    Transient ischemic attack Father        in 80s   Coronary artery disease Father        stents @ 6   Nephrolithiasis Father    Urolithiasis Sister    Urolithiasis Sister    Multiple myeloma Sister    Diabetes Neg Hx    Colon cancer Neg Hx    Rectal cancer Neg Hx    Stomach cancer Neg Hx    Esophageal cancer Neg  Hx    Past Surgical History:  Procedure Laterality Date   ABDOMINAL HYSTERECTOMY     APPENDECTOMY     BACK SURGERY     BREAST EXCISIONAL BIOPSY Right 1983   COLONOSCOPY W/ POLYPECTOMY  2003,2006,2013   [name]   DIAGNOSTIC LAPAROSCOPY  1979   exploratory to get pregnant   DILATION AND CURETTAGE OF UTERUS     X2   EYE SURGERY     JOINT REPLACEMENT     NEPHRECTOMY  2010   Left for 9.8cm benign complex cyst   SPINE SURGERY     THYROIDECTOMY  2000   Dr Queen   TONSILLECTOMY     TOTAL ABDOMINAL HYSTERECTOMY W/ BILATERAL SALPINGOOPHORECTOMY  2003   [name] for dysfunctional  menses   TOTAL HIP ARTHROPLASTY Left 06/24/2019   Procedure: TOTAL HIP ARTHROPLASTY ANTERIOR APPROACH;  Surgeon: Beverley Evalene BIRCH, MD;  Location: [facility];  Service: Orthopedics;  Laterality: Left;   TUBAL LIGATION     Social History[1] Social History   Social History Narrative   Fun/Hobbies: Grandkids     Immunization History  Administered Date(s) Administered   Fluad Quad(high Dose 65+) 05/24/2020, 06/15/2022   Fluad Trivalent(High Dose 65+) 07/28/2024   INFLUENZA, HIGH DOSE SEASONAL PF 06/18/2013, 04/21/2016, 05/23/2017, 05/17/2018, 05/27/2019   Influenza Split 06/21/2011, 06/12/2012   Influenza Whole 07/11/2007, 06/10/2008, 06/29/2010   Influenza,inj,Quad PF,6+ Mos 06/10/2014, 05/07/2015   Influenza-Unspecified 05/29/2019   Moderna Covid-19 Fall Seasonal Vaccine 7yrs & older 09/12/2022   Moderna Sars-Covid-2 Vaccination 09/20/2019, 10/18/2019, 06/21/2020   PNEUMOCOCCAL CONJUGATE-20 07/01/2023   Pneumococcal Conjugate-13 11/17/2015   Pneumococcal Polysaccharide-23 09/17/2013   Respiratory Syncytial Virus Vaccine,Recomb Aduvanted(Arexvy) 09/12/2022   Td 01/05/2003   Tdap 02/05/2013   Zoster Recombinant(Shingrix) 10/27/2023, 02/24/2024   Zoster, Live 11/10/2011     Objective: Vital Signs: There were no vitals taken for this visit.   Physical Exam   Musculoskeletal Exam: ***  CDAI Exam: CDAI Score: -- Patient Global: --; Provider Global: -- Swollen: --; Tender: -- Joint Exam 10/13/2024   No joint exam has been documented for this visit   There is currently no information documented on the homunculus. Go to the Rheumatology activity and complete the homunculus joint exam.  Investigation: No additional findings.  Imaging: No results found.  Recent Labs: Lab Results  Component Value Date   WBC 9.7 08/29/2024   HGB 15.1 08/29/2024   PLT 321 08/29/2024   NA 142 08/29/2024   K 4.4 08/29/2024   CL 105 08/29/2024   CO2 29 08/29/2024   GLUCOSE 101 (H)  08/29/2024   BUN 19 08/29/2024   CREATININE 0.80 08/29/2024   BILITOT 0.6 08/29/2024   ALKPHOS 163 (H) 08/13/2023   AST 21 08/29/2024   ALT 24 08/29/2024   PROT 7.3 08/29/2024   ALBUMIN 4.7 08/13/2023   CALCIUM  9.6 08/29/2024   GFRAA >60 06/16/2019    Speciality Comments: No specialty comments available.  Procedures:  No procedures performed Allergies: Atorvastatin , Atorvastatin  calcium , Rosuvastatin , Simvastatin, Codeine sulfate, and Erythromycin   Assessment / Plan:     Visit Diagnoses: Rheumatoid factor positive  Polyarthralgia  Positive ANA (antinuclear antibody)  High risk medication use  Primary osteoarthritis of hands, bilateral  Primary osteoarthritis of both feet  Chronic hip pain after total replacement of left hip joint  Greater trochanteric bursitis of left hip  Age-related osteoporosis without current pathological fracture  S/P lumbar fusion  Spinal stenosis of lumbar region with neurogenic claudication  Postoperative hypothyroidism  Pituitary macroadenoma (  HCC)  Essential hypertension  History of hyperlipidemia  History of colonic polyps  Orders: No orders of the defined types were placed in this encounter.  No orders of the defined types were placed in this encounter.   Face-to-face time spent with patient was *** minutes. Greater than 50% of time was spent in counseling and coordination of care.  Follow-Up Instructions: No follow-ups on file.   Waddell CHRISTELLA Craze, PA-C  Note - This record has been created using Dragon software.  Chart creation errors have been sought, but may not always  have been located. Such creation errors do not reflect on  the standard of medical care.     [1]  Social History Tobacco Use   Smoking status: Former    Current packs/day: 0.00    Average packs/day: 0.5 packs/day for 4.0 years (2.0 ttl pk-yrs)    Types: Cigarettes    Start date: 08/29/1971    Quit date: 08/29/1975    Years since quitting: 49.1    Smokeless tobacco: Never   Tobacco comments:    smoked 1973-1977, up to 1/2 ppd  Vaping Use   Vaping status: Never Used  Substance Use Topics   Alcohol use: Yes    Comment: 1 drink every 2 months   Drug use: No   "

## 2024-10-01 ENCOUNTER — Other Ambulatory Visit: Payer: Self-pay

## 2024-10-01 DIAGNOSIS — Z79899 Other long term (current) drug therapy: Secondary | ICD-10-CM

## 2024-10-01 LAB — CBC WITH DIFFERENTIAL/PLATELET
Absolute Lymphocytes: 1312 {cells}/uL (ref 850–3900)
Absolute Monocytes: 705 {cells}/uL (ref 200–950)
Basophils Absolute: 57 {cells}/uL (ref 0–200)
Basophils Relative: 0.7 %
Eosinophils Absolute: 381 {cells}/uL (ref 15–500)
Eosinophils Relative: 4.7 %
HCT: 44.6 % (ref 35.9–46.0)
Hemoglobin: 14.8 g/dL (ref 11.7–15.5)
MCH: 31.7 pg (ref 27.0–33.0)
MCHC: 33.2 g/dL (ref 31.6–35.4)
MCV: 95.5 fL (ref 81.4–101.7)
MPV: 10 fL (ref 7.5–12.5)
Monocytes Relative: 8.7 %
Neutro Abs: 5646 {cells}/uL (ref 1500–7800)
Neutrophils Relative %: 69.7 %
Platelets: 307 10*3/uL (ref 140–400)
RBC: 4.67 Million/uL (ref 3.80–5.10)
RDW: 12.7 % (ref 11.0–15.0)
Total Lymphocyte: 16.2 %
WBC: 8.1 10*3/uL (ref 3.8–10.8)

## 2024-10-01 LAB — COMPREHENSIVE METABOLIC PANEL WITH GFR
AG Ratio: 1.8 (calc) (ref 1.0–2.5)
ALT: 18 U/L (ref 6–29)
AST: 20 U/L (ref 10–35)
Albumin: 4.6 g/dL (ref 3.6–5.1)
Alkaline phosphatase (APISO): 83 U/L (ref 37–153)
BUN: 21 mg/dL (ref 7–25)
CO2: 28 mmol/L (ref 20–32)
Calcium: 9.4 mg/dL (ref 8.6–10.4)
Chloride: 104 mmol/L (ref 98–110)
Creat: 0.83 mg/dL (ref 0.60–1.00)
Globulin: 2.5 g/dL (ref 1.9–3.7)
Glucose, Bld: 107 mg/dL — ABNORMAL HIGH (ref 65–99)
Potassium: 4.6 mmol/L (ref 3.5–5.3)
Sodium: 142 mmol/L (ref 135–146)
Total Bilirubin: 0.5 mg/dL (ref 0.2–1.2)
Total Protein: 7.1 g/dL (ref 6.1–8.1)
eGFR: 72 mL/min/{1.73_m2}

## 2024-10-01 NOTE — Progress Notes (Unsigned)
 Patient here for one month labs after starting Plaquenil . CBC and CMP released.

## 2024-10-02 ENCOUNTER — Other Ambulatory Visit: Payer: Self-pay | Admitting: Family

## 2024-10-02 ENCOUNTER — Ambulatory Visit: Payer: Self-pay | Admitting: Physician Assistant

## 2024-10-02 DIAGNOSIS — F419 Anxiety disorder, unspecified: Secondary | ICD-10-CM

## 2024-10-02 NOTE — Progress Notes (Signed)
 CBC WNL  Glucose is 107, rest of CMP WNL.

## 2024-10-03 NOTE — Telephone Encounter (Signed)
 Requesting: alprazolam  0.25mg   Contract: None UDS: None Last Visit: 05/02/24 Next Visit: 10/30/24 Last Refill: 09/03/24 #60 and 0RF   Please Advise

## 2024-10-03 NOTE — Telephone Encounter (Signed)
 [employer] - Prior Authorization REQUIRED for [employer]

## 2024-10-07 ENCOUNTER — Ambulatory Visit: Admitting: Physician Assistant

## 2024-10-13 ENCOUNTER — Ambulatory Visit: Admitting: Physician Assistant

## 2024-10-13 DIAGNOSIS — Z860100 Personal history of colon polyps, unspecified: Secondary | ICD-10-CM

## 2024-10-13 DIAGNOSIS — Z8639 Personal history of other endocrine, nutritional and metabolic disease: Secondary | ICD-10-CM

## 2024-10-13 DIAGNOSIS — M19042 Primary osteoarthritis, left hand: Secondary | ICD-10-CM

## 2024-10-13 DIAGNOSIS — Z79899 Other long term (current) drug therapy: Secondary | ICD-10-CM

## 2024-10-13 DIAGNOSIS — I10 Essential (primary) hypertension: Secondary | ICD-10-CM

## 2024-10-13 DIAGNOSIS — Z981 Arthrodesis status: Secondary | ICD-10-CM

## 2024-10-13 DIAGNOSIS — G8929 Other chronic pain: Secondary | ICD-10-CM

## 2024-10-13 DIAGNOSIS — M2550 Pain in unspecified joint: Secondary | ICD-10-CM

## 2024-10-13 DIAGNOSIS — M19071 Primary osteoarthritis, right ankle and foot: Secondary | ICD-10-CM

## 2024-10-13 DIAGNOSIS — M7062 Trochanteric bursitis, left hip: Secondary | ICD-10-CM

## 2024-10-13 DIAGNOSIS — M810 Age-related osteoporosis without current pathological fracture: Secondary | ICD-10-CM

## 2024-10-13 DIAGNOSIS — E890 Postprocedural hypothyroidism: Secondary | ICD-10-CM

## 2024-10-13 DIAGNOSIS — M48062 Spinal stenosis, lumbar region with neurogenic claudication: Secondary | ICD-10-CM

## 2024-10-13 DIAGNOSIS — D352 Benign neoplasm of pituitary gland: Secondary | ICD-10-CM

## 2024-10-13 DIAGNOSIS — R7689 Other specified abnormal immunological findings in serum: Secondary | ICD-10-CM

## 2024-10-24 ENCOUNTER — Ambulatory Visit: Admitting: Internal Medicine

## 2024-10-30 ENCOUNTER — Ambulatory Visit: Admitting: Family Medicine

## 2024-11-07 ENCOUNTER — Ambulatory Visit: Admitting: Internal Medicine

## 2025-09-28 ENCOUNTER — Ambulatory Visit
